# Patient Record
Sex: Female | Born: 1970 | Race: Black or African American | Hispanic: No | Marital: Married | Smoking: Never smoker
Health system: Southern US, Community
[De-identification: ages and names within clinical notes are randomized; demographics above are authoritative.]

## PROBLEM LIST (undated history)

## (undated) DIAGNOSIS — I82409 Acute embolism and thrombosis of unspecified deep veins of unspecified lower extremity: Secondary | ICD-10-CM

## (undated) DIAGNOSIS — R569 Unspecified convulsions: Secondary | ICD-10-CM

## (undated) DIAGNOSIS — G43909 Migraine, unspecified, not intractable, without status migrainosus: Secondary | ICD-10-CM

## (undated) DIAGNOSIS — N809 Endometriosis, unspecified: Secondary | ICD-10-CM

## (undated) DIAGNOSIS — M329 Systemic lupus erythematosus, unspecified: Secondary | ICD-10-CM

## (undated) HISTORY — PX: ABDOMINAL HYSTERECTOMY: SHX81

---

## 1998-03-06 ENCOUNTER — Other Ambulatory Visit: Admission: RE | Admit: 1998-03-06 | Discharge: 1998-03-06 | Payer: Self-pay | Admitting: Obstetrics

## 1998-04-10 ENCOUNTER — Emergency Department (HOSPITAL_COMMUNITY): Admission: EM | Admit: 1998-04-10 | Discharge: 1998-04-10 | Payer: Self-pay | Admitting: *Deleted

## 1998-05-08 ENCOUNTER — Inpatient Hospital Stay (HOSPITAL_COMMUNITY): Admission: AD | Admit: 1998-05-08 | Discharge: 1998-05-08 | Payer: Self-pay | Admitting: Obstetrics

## 1998-07-24 ENCOUNTER — Inpatient Hospital Stay (HOSPITAL_COMMUNITY): Admission: AD | Admit: 1998-07-24 | Discharge: 1998-07-24 | Payer: Self-pay | Admitting: Obstetrics

## 1998-10-15 ENCOUNTER — Emergency Department (HOSPITAL_COMMUNITY): Admission: EM | Admit: 1998-10-15 | Discharge: 1998-10-15 | Payer: Self-pay | Admitting: Emergency Medicine

## 1999-02-10 ENCOUNTER — Emergency Department (HOSPITAL_COMMUNITY): Admission: EM | Admit: 1999-02-10 | Discharge: 1999-02-11 | Payer: Self-pay | Admitting: Emergency Medicine

## 1999-05-05 ENCOUNTER — Emergency Department (HOSPITAL_COMMUNITY): Admission: EM | Admit: 1999-05-05 | Discharge: 1999-05-05 | Payer: Self-pay | Admitting: *Deleted

## 1999-05-07 ENCOUNTER — Emergency Department (HOSPITAL_COMMUNITY): Admission: EM | Admit: 1999-05-07 | Discharge: 1999-05-07 | Payer: Self-pay | Admitting: Emergency Medicine

## 1999-08-17 ENCOUNTER — Emergency Department (HOSPITAL_COMMUNITY): Admission: EM | Admit: 1999-08-17 | Discharge: 1999-08-17 | Payer: Self-pay | Admitting: *Deleted

## 2000-05-24 ENCOUNTER — Emergency Department (HOSPITAL_COMMUNITY): Admission: EM | Admit: 2000-05-24 | Discharge: 2000-05-24 | Payer: Self-pay | Admitting: Emergency Medicine

## 2000-05-25 ENCOUNTER — Encounter: Payer: Self-pay | Admitting: Emergency Medicine

## 2001-11-03 ENCOUNTER — Encounter: Payer: Self-pay | Admitting: Obstetrics

## 2001-11-03 ENCOUNTER — Ambulatory Visit (HOSPITAL_COMMUNITY): Admission: RE | Admit: 2001-11-03 | Discharge: 2001-11-03 | Payer: Self-pay | Admitting: Obstetrics

## 2002-06-13 ENCOUNTER — Encounter: Payer: Self-pay | Admitting: Emergency Medicine

## 2002-06-13 ENCOUNTER — Emergency Department (HOSPITAL_COMMUNITY): Admission: EM | Admit: 2002-06-13 | Discharge: 2002-06-13 | Payer: Self-pay | Admitting: Emergency Medicine

## 2002-08-20 ENCOUNTER — Emergency Department (HOSPITAL_COMMUNITY): Admission: EM | Admit: 2002-08-20 | Discharge: 2002-08-20 | Payer: Self-pay

## 2003-01-10 ENCOUNTER — Encounter: Payer: Self-pay | Admitting: Obstetrics and Gynecology

## 2003-01-10 ENCOUNTER — Inpatient Hospital Stay (HOSPITAL_COMMUNITY): Admission: AD | Admit: 2003-01-10 | Discharge: 2003-01-10 | Payer: Self-pay | Admitting: Obstetrics

## 2003-01-29 ENCOUNTER — Encounter: Payer: Self-pay | Admitting: Internal Medicine

## 2003-01-29 ENCOUNTER — Encounter: Admission: RE | Admit: 2003-01-29 | Discharge: 2003-01-29 | Payer: Self-pay | Admitting: Internal Medicine

## 2003-06-07 ENCOUNTER — Encounter: Payer: Self-pay | Admitting: Emergency Medicine

## 2003-06-07 ENCOUNTER — Emergency Department (HOSPITAL_COMMUNITY): Admission: EM | Admit: 2003-06-07 | Discharge: 2003-06-07 | Payer: Self-pay | Admitting: Emergency Medicine

## 2003-07-20 ENCOUNTER — Inpatient Hospital Stay (HOSPITAL_COMMUNITY): Admission: AD | Admit: 2003-07-20 | Discharge: 2003-07-21 | Payer: Self-pay | Admitting: Obstetrics

## 2003-07-20 ENCOUNTER — Encounter: Payer: Self-pay | Admitting: Obstetrics

## 2003-08-03 ENCOUNTER — Encounter: Payer: Self-pay | Admitting: Obstetrics and Gynecology

## 2003-08-03 ENCOUNTER — Inpatient Hospital Stay (HOSPITAL_COMMUNITY): Admission: AD | Admit: 2003-08-03 | Discharge: 2003-08-03 | Payer: Self-pay | Admitting: Obstetrics

## 2003-10-12 ENCOUNTER — Inpatient Hospital Stay (HOSPITAL_COMMUNITY): Admission: AD | Admit: 2003-10-12 | Discharge: 2003-10-12 | Payer: Self-pay | Admitting: Obstetrics

## 2005-12-14 DIAGNOSIS — M329 Systemic lupus erythematosus, unspecified: Secondary | ICD-10-CM

## 2005-12-14 HISTORY — DX: Systemic lupus erythematosus, unspecified: M32.9

## 2011-06-09 ENCOUNTER — Emergency Department (HOSPITAL_COMMUNITY)
Admission: EM | Admit: 2011-06-09 | Discharge: 2011-06-10 | Disposition: A | Discharge status: Home / Self Care | Payer: Medicaid Other | Attending: Emergency Medicine | Admitting: Emergency Medicine

## 2011-06-09 DIAGNOSIS — M25519 Pain in unspecified shoulder: Secondary | ICD-10-CM | POA: Insufficient documentation

## 2011-06-09 DIAGNOSIS — R064 Hyperventilation: Secondary | ICD-10-CM | POA: Insufficient documentation

## 2011-06-09 DIAGNOSIS — F410 Panic disorder [episodic paroxysmal anxiety] without agoraphobia: Secondary | ICD-10-CM | POA: Insufficient documentation

## 2011-06-09 DIAGNOSIS — M542 Cervicalgia: Secondary | ICD-10-CM | POA: Insufficient documentation

## 2011-06-09 DIAGNOSIS — R209 Unspecified disturbances of skin sensation: Secondary | ICD-10-CM | POA: Insufficient documentation

## 2011-06-09 DIAGNOSIS — R5383 Other fatigue: Secondary | ICD-10-CM | POA: Insufficient documentation

## 2011-06-09 DIAGNOSIS — R5381 Other malaise: Secondary | ICD-10-CM | POA: Insufficient documentation

## 2011-06-09 DIAGNOSIS — R55 Syncope and collapse: Secondary | ICD-10-CM | POA: Insufficient documentation

## 2011-06-09 DIAGNOSIS — R51 Headache: Secondary | ICD-10-CM | POA: Insufficient documentation

## 2011-06-09 DIAGNOSIS — Z79899 Other long term (current) drug therapy: Secondary | ICD-10-CM | POA: Insufficient documentation

## 2011-06-09 DIAGNOSIS — R6884 Jaw pain: Secondary | ICD-10-CM | POA: Insufficient documentation

## 2011-06-09 DIAGNOSIS — M329 Systemic lupus erythematosus, unspecified: Secondary | ICD-10-CM | POA: Insufficient documentation

## 2011-06-10 ENCOUNTER — Encounter (HOSPITAL_COMMUNITY): Payer: Self-pay | Admitting: Radiology

## 2011-06-10 ENCOUNTER — Emergency Department (HOSPITAL_COMMUNITY): Payer: Medicaid Other

## 2011-06-10 LAB — URINALYSIS, ROUTINE W REFLEX MICROSCOPIC
Glucose, UA: NEGATIVE mg/dL
Leukocytes, UA: NEGATIVE
Nitrite: NEGATIVE
Specific Gravity, Urine: 1.026 (ref 1.005–1.030)
pH: 6.5 (ref 5.0–8.0)

## 2011-06-10 LAB — CBC
Hemoglobin: 11.1 g/dL — ABNORMAL LOW (ref 12.0–15.0)
MCHC: 33.5 g/dL (ref 30.0–36.0)
MCV: 81.3 fL (ref 78.0–100.0)
RBC: 4.07 MIL/uL (ref 3.87–5.11)
WBC: 7.4 10*3/uL (ref 4.0–10.5)

## 2011-06-10 LAB — BASIC METABOLIC PANEL
BUN: 11 mg/dL (ref 6–23)
CO2: 21 mEq/L (ref 19–32)
Chloride: 103 mEq/L (ref 96–112)
GFR calc Af Amer: >60 mL/min (ref >60)
Potassium: 3.3 mEq/L — ABNORMAL LOW (ref 3.5–5.1)

## 2011-06-10 LAB — DIFFERENTIAL
Basophils Absolute: 0 10*3/uL (ref 0.0–0.1)
Lymphs Abs: 3 10*3/uL (ref 0.7–4.0)

## 2011-06-10 LAB — CK TOTAL AND CKMB (NOT AT ARMC): Relative Index: 1.5 (ref 0.0–2.5)

## 2011-06-10 LAB — TROPONIN I: Troponin I: <0.3 ng/mL (ref <0.30)

## 2011-06-20 DIAGNOSIS — G43109 Migraine with aura, not intractable, without status migrainosus: Secondary | ICD-10-CM | POA: Insufficient documentation

## 2011-06-20 DIAGNOSIS — N809 Endometriosis, unspecified: Secondary | ICD-10-CM | POA: Insufficient documentation

## 2011-06-20 DIAGNOSIS — F409 Phobic anxiety disorder, unspecified: Secondary | ICD-10-CM | POA: Insufficient documentation

## 2011-06-20 DIAGNOSIS — F329 Major depressive disorder, single episode, unspecified: Secondary | ICD-10-CM | POA: Insufficient documentation

## 2011-06-20 DIAGNOSIS — M1990 Unspecified osteoarthritis, unspecified site: Secondary | ICD-10-CM | POA: Insufficient documentation

## 2011-06-20 DIAGNOSIS — I82409 Acute embolism and thrombosis of unspecified deep veins of unspecified lower extremity: Secondary | ICD-10-CM | POA: Insufficient documentation

## 2011-06-20 DIAGNOSIS — F32A Depression, unspecified: Secondary | ICD-10-CM | POA: Insufficient documentation

## 2011-09-25 DIAGNOSIS — R102 Pelvic and perineal pain unspecified side: Secondary | ICD-10-CM | POA: Insufficient documentation

## 2012-02-29 DIAGNOSIS — M549 Dorsalgia, unspecified: Secondary | ICD-10-CM | POA: Insufficient documentation

## 2012-06-24 DIAGNOSIS — G932 Benign intracranial hypertension: Secondary | ICD-10-CM | POA: Insufficient documentation

## 2012-06-25 DIAGNOSIS — M542 Cervicalgia: Secondary | ICD-10-CM | POA: Insufficient documentation

## 2012-06-25 DIAGNOSIS — G250 Essential tremor: Secondary | ICD-10-CM | POA: Insufficient documentation

## 2012-09-27 DIAGNOSIS — T7491XA Unspecified adult maltreatment, confirmed, initial encounter: Secondary | ICD-10-CM | POA: Insufficient documentation

## 2013-03-26 ENCOUNTER — Encounter (HOSPITAL_COMMUNITY): Payer: Self-pay | Admitting: *Deleted

## 2013-03-26 ENCOUNTER — Emergency Department (HOSPITAL_COMMUNITY)
Admission: EM | Admit: 2013-03-26 | Discharge: 2013-03-27 | Disposition: A | Discharge status: Home / Self Care | Payer: Medicaid Other | Attending: Emergency Medicine | Admitting: Emergency Medicine

## 2013-03-26 DIAGNOSIS — R109 Unspecified abdominal pain: Secondary | ICD-10-CM

## 2013-03-26 DIAGNOSIS — M329 Systemic lupus erythematosus, unspecified: Secondary | ICD-10-CM | POA: Insufficient documentation

## 2013-03-26 DIAGNOSIS — G40909 Epilepsy, unspecified, not intractable, without status epilepticus: Secondary | ICD-10-CM | POA: Insufficient documentation

## 2013-03-26 DIAGNOSIS — Z9889 Other specified postprocedural states: Secondary | ICD-10-CM | POA: Insufficient documentation

## 2013-03-26 DIAGNOSIS — Z9079 Acquired absence of other genital organ(s): Secondary | ICD-10-CM | POA: Insufficient documentation

## 2013-03-26 DIAGNOSIS — N7013 Chronic salpingitis and oophoritis: Secondary | ICD-10-CM | POA: Insufficient documentation

## 2013-03-26 DIAGNOSIS — Z90710 Acquired absence of both cervix and uterus: Secondary | ICD-10-CM | POA: Insufficient documentation

## 2013-03-26 DIAGNOSIS — N7011 Chronic salpingitis: Secondary | ICD-10-CM

## 2013-03-26 DIAGNOSIS — R112 Nausea with vomiting, unspecified: Secondary | ICD-10-CM | POA: Insufficient documentation

## 2013-03-26 HISTORY — DX: Systemic lupus erythematosus, unspecified: M32.9

## 2013-03-26 MED ORDER — FENTANYL CITRATE 0.05 MG/ML IJ SOLN
50.0000 ug | Freq: Once | INTRAMUSCULAR | Status: AC
  Administered 2013-03-27: 50 ug via INTRAVENOUS
  Filled 2013-03-26: qty 2

## 2013-03-26 MED ORDER — ONDANSETRON HCL 4 MG/2ML IJ SOLN
4.0000 mg | Freq: Once | INTRAMUSCULAR | Status: AC
  Administered 2013-03-27: 4 mg via INTRAVENOUS
  Filled 2013-03-26: qty 2

## 2013-03-26 NOTE — ED Notes (Signed)
C/o abd pain, onset 1.5 hrs ago, also nv. H/o lupus. Pinpoints to RLQ. Gradually progressively worse. Restless/writhing.

## 2013-03-26 NOTE — ED Notes (Signed)
Back to room via w/c, with family, plan and process explained, family with pt.

## 2013-03-27 ENCOUNTER — Emergency Department (HOSPITAL_COMMUNITY): Payer: Medicaid Other

## 2013-03-27 ENCOUNTER — Encounter (HOSPITAL_COMMUNITY): Payer: Self-pay

## 2013-03-27 DIAGNOSIS — M329 Systemic lupus erythematosus, unspecified: Secondary | ICD-10-CM | POA: Insufficient documentation

## 2013-03-27 LAB — COMPREHENSIVE METABOLIC PANEL
ALT: 13 U/L (ref 0–35)
AST: 20 U/L (ref 0–37)
Albumin: 4.3 g/dL (ref 3.5–5.2)
Alkaline Phosphatase: 68 U/L (ref 39–117)
Chloride: 103 mEq/L (ref 96–112)
Potassium: 3.5 mEq/L (ref 3.5–5.1)
Sodium: 139 mEq/L (ref 135–145)
Total Bilirubin: 0.3 mg/dL (ref 0.3–1.2)

## 2013-03-27 LAB — CBC WITH DIFFERENTIAL/PLATELET
Eosinophils Absolute: 0.2 10*3/uL (ref 0.0–0.7)
Eosinophils Relative: 2 % (ref 0–5)
Lymphs Abs: 2.7 10*3/uL (ref 0.7–4.0)
MCH: 28.1 pg (ref 26.0–34.0)
MCHC: 34.2 g/dL (ref 30.0–36.0)
MCV: 82.3 fL (ref 78.0–100.0)
Platelets: 400 10*3/uL (ref 150–400)
RBC: 4.41 MIL/uL (ref 3.87–5.11)
RDW: 12.8 % (ref 11.5–15.5)

## 2013-03-27 LAB — URINE MICROSCOPIC-ADD ON

## 2013-03-27 LAB — URINALYSIS, ROUTINE W REFLEX MICROSCOPIC
Glucose, UA: NEGATIVE mg/dL
Ketones, ur: NEGATIVE mg/dL
Leukocytes, UA: NEGATIVE
Protein, ur: NEGATIVE mg/dL

## 2013-03-27 MED ORDER — ONDANSETRON HCL 4 MG PO TABS: 4.0000 mg | ORAL_TABLET | Freq: Four times a day (QID) | ORAL | Status: DC

## 2013-03-27 MED ORDER — IOHEXOL 300 MG/ML  SOLN
100.0000 mL | Freq: Once | INTRAMUSCULAR | Status: AC | PRN
  Administered 2013-03-27: 80 mL via INTRAVENOUS

## 2013-03-27 MED ORDER — IOHEXOL 300 MG/ML  SOLN
25.0000 mL | INTRAMUSCULAR | Status: AC
  Administered 2013-03-27 (×2): 25 mL via ORAL

## 2013-03-27 MED ORDER — ONDANSETRON HCL 4 MG/2ML IJ SOLN
4.0000 mg | Freq: Once | INTRAMUSCULAR | Status: AC
  Administered 2013-03-27: 4 mg via INTRAVENOUS
  Filled 2013-03-27: qty 2

## 2013-03-27 MED ORDER — DIPHENHYDRAMINE HCL 50 MG/ML IJ SOLN
25.0000 mg | Freq: Once | INTRAMUSCULAR | Status: AC
  Administered 2013-03-27: 25 mg via INTRAVENOUS

## 2013-03-27 MED ORDER — HYDROMORPHONE HCL PF 1 MG/ML IJ SOLN
1.0000 mg | Freq: Once | INTRAMUSCULAR | Status: AC
  Administered 2013-03-27: 1 mg via INTRAVENOUS
  Filled 2013-03-27: qty 1

## 2013-03-27 MED ORDER — OXYCODONE-ACETAMINOPHEN 5-325 MG PO TABS: 2.0000 | ORAL_TABLET | ORAL | Status: DC | PRN

## 2013-03-27 MED ORDER — DIPHENHYDRAMINE HCL 50 MG/ML IJ SOLN
INTRAMUSCULAR | Status: AC
  Filled 2013-03-27: qty 1

## 2013-03-27 NOTE — ED Notes (Signed)
Pt c/o sudden onset RLQ pain starting approx 2 hours ago. Pt reports having a lupus flare up 2 weeks ago, pt reports this pain is different than her lupus flare up 2 weeks ago. Pt does c/o all over joint pain starting this am. Pt states she has had 2 partial hysterectomy's at [facility]. Pt states she found out 2 months ago that she still has her Right fallopian tube and Right ovary which she thought was removed in 2008. Pt denies abnormal vaginal odor, discharge, or N/V

## 2013-03-27 NOTE — ED Provider Notes (Signed)
History     CSN: [id number]  Arrival date & time 03/26/13  2305   First MD Initiated Contact with Patient 03/26/13 2345      Chief Complaint  Patient presents with  . Abdominal Pain  . Emesis    (Consider location/radiation/quality/duration/timing/severity/associated sxs/prior treatment) HPI 42 year old female presents to the emergency department with complaint of severe right-sided abdominal pain.  Pain starts in around 945.  She has had one episode of nausea and vomiting associated with the pain.  Patient reports about 2 weeks ago, she was told that she had an enlarged right fallopian tube, while at an outpatient clinic at [facility].  She was placed on pain medicine.  Patient has history of lupus, and reports that she is on steroid injections as prednisone pills "are not helping my disease".  Patient reports past history of severe endometriosis.  She is status post hysterectomy.  She reports she had a left oophorectomy done as well.  Patient still has gallbladder, and appendix.  Pain came on suddenly.  She has had no recent diarrhea, she reports her last bowel movement was today and was normal.  No fevers or chills.  She denies history of diverticulitis, diverticulosis or colitis.  Past Medical History  Diagnosis Date  . Seizure disorder   . Lupus     Past Surgical History  Procedure Laterality Date  . Abdominal hysterectomy    . Cesarean section      No family history on file.  History  Substance Use Topics  . Smoking status: Never Smoker   . Smokeless tobacco: Not on file  . Alcohol Use: No    OB History   Grav Para Term Preterm Abortions TAB SAB Ect Mult Living                  Review of Systems  See History of Present Illness; otherwise all other systems are reviewed and negative Allergies  Compazine; Nsaids; and Reglan  Home Medications   Current Outpatient Rx  Name  Route  Sig  Dispense  Refill  . acetaminophen-codeine (TYLENOL #3) 300-30 MG per tablet    Oral   Take 1 tablet by mouth every 4 (four) hours as needed for pain.         . Cholecalciferol (VITAMIN D PO)   Oral   Take 1 tablet by mouth daily.         . clonazePAM (KLONOPIN) 0.5 MG tablet   Oral   Take 0.5 mg by mouth daily.         Marland Kitchen thiamine (VITAMIN B-1) 100 MG tablet   Oral   Take 100 mg by mouth daily.           BP 120/95  Pulse 110  Temp(Src) 98.9 F (37.2 C) (Oral)  Resp 28  SpO2 100%  Physical Exam  Nursing note and vitals reviewed. Constitutional: She is oriented to person, place, and time. She appears well-developed and well-nourished.  HENT:  Head: Normocephalic and atraumatic.  Nose: Nose normal.  Mouth/Throat: Oropharynx is clear and moist.  Eyes: Conjunctivae and EOM are normal. Pupils are equal, round, and reactive to light.  Neck: Normal range of motion. Neck supple. No JVD present. No tracheal deviation present. No thyromegaly present.  Cardiovascular: Normal rate, regular rhythm, normal heart sounds and intact distal pulses.  Exam reveals no gallop and no friction rub.   No murmur heard. Pulmonary/Chest: Effort normal and breath sounds normal. No stridor. No respiratory distress. She has  no wheezes. She has no rales. She exhibits no tenderness.  Abdominal: Soft. She exhibits distension. She exhibits no mass. There is tenderness (severe tenderness along the right side of the abdomen..  Tenderness extends from hostile margin down to iliac crest.  Not significantly tender over suprapubic region or left side of abdomen.  There is no rebound, there is voluntary guarding.  ). There is no rebound and no guarding.  Decreased bowel sounds  Musculoskeletal: Normal range of motion. She exhibits no edema and no tenderness.  Lymphadenopathy:    She has no cervical adenopathy.  Neurological: She is alert and oriented to person, place, and time. She exhibits normal muscle tone. Coordination normal.  Skin: Skin is warm and dry. No rash noted. No erythema.  No pallor.  Psychiatric: She has a normal mood and affect. Her behavior is normal. Judgment and thought content normal.    ED Course  Procedures (including critical care time)  Labs Reviewed  COMPREHENSIVE METABOLIC PANEL - Abnormal; Notable for the following:    GFR calc non Af Amer 89 (*)    All other components within normal limits  CBC WITH DIFFERENTIAL  LACTIC ACID, PLASMA  URINALYSIS, ROUTINE W REFLEX MICROSCOPIC   US Transvaginal Non-ob  03/27/2013  *RADIOLOGY REPORT*  Clinical Data:  Right-sided hydrosalpinx increased in size; severe right-sided pelvic pain.  Assess for torsion.  TRANSABDOMINAL AND TRANSVAGINAL ULTRASOUND OF PELVIS DOPPLER ULTRASOUND OF OVARIES  Technique:  Both transabdominal and transvaginal ultrasound examinations of the pelvis were performed. Transabdominal technique was performed for global imaging of the pelvis including uterus, ovaries, adnexal regions, and pelvic cul-de-sac.  It was necessary to proceed with endovaginal exam following the transabdominal exam to visualize the right ovary and adnexa in greater detail.  Color and duplex Doppler ultrasound was utilized to evaluate blood flow to the ovaries.  Comparison:  None.  Findings:  Uterus:  Status post hysterectomy.  Right ovary: Normal appearance/no adnexal mass; measures 3.7 x 2.0 x 2.4 cm.  Left ovary:   Status post left-sided oophorectomy, per clinical correlation.  Pulsed Doppler evaluation demonstrates normal low-resistance arterial and venous waveforms in the right ovary.  A large hypoechoic tubular structure is noted at the right adnexa, adjacent to the right ovary.  Per the patient's clinical history, this apparently reflects a large hydrosalpinx, measuring approximately 7.7 x 6.8 x 3.4 cm.  Alternatively, a septated paraovarian cyst might have a similar appearance.  A small amount of free fluid is noted about the right ovary.  IMPRESSION:  1.  Large right-sided hydrosalpinx again noted, measuring 7.7 x  6.8 x 3.4 cm.  This is anechoic in appearance, without evidence for infection. 2.  Right ovary unremarkable in appearance; no evidence of ovarian torsion. 3.  Status post hysterectomy and left-sided oophorectomy.   Original Report Authenticated By: Tonia Ghent, M.D.    US Pelvis Complete  03/27/2013  *RADIOLOGY REPORT*  Clinical Data:  Right-sided hydrosalpinx increased in size; severe right-sided pelvic pain.  Assess for torsion.  TRANSABDOMINAL AND TRANSVAGINAL ULTRASOUND OF PELVIS DOPPLER ULTRASOUND OF OVARIES  Technique:  Both transabdominal and transvaginal ultrasound examinations of the pelvis were performed. Transabdominal technique was performed for global imaging of the pelvis including uterus, ovaries, adnexal regions, and pelvic cul-de-sac.  It was necessary to proceed with endovaginal exam following the transabdominal exam to visualize the right ovary and adnexa in greater detail.  Color and duplex Doppler ultrasound was utilized to evaluate blood flow to the ovaries.  Comparison:  None.  Findings:  Uterus:  Status post hysterectomy.  Right ovary: Normal appearance/no adnexal mass; measures 3.7 x 2.0 x 2.4 cm.  Left ovary:   Status post left-sided oophorectomy, per clinical correlation.  Pulsed Doppler evaluation demonstrates normal low-resistance arterial and venous waveforms in the right ovary.  A large hypoechoic tubular structure is noted at the right adnexa, adjacent to the right ovary.  Per the patient's clinical history, this apparently reflects a large hydrosalpinx, measuring approximately 7.7 x 6.8 x 3.4 cm.  Alternatively, a septated paraovarian cyst might have a similar appearance.  A small amount of free fluid is noted about the right ovary.  IMPRESSION:  1.  Large right-sided hydrosalpinx again noted, measuring 7.7 x 6.8 x 3.4 cm.  This is anechoic in appearance, without evidence for infection. 2.  Right ovary unremarkable in appearance; no evidence of ovarian torsion. 3.  Status post  hysterectomy and left-sided oophorectomy.   Original Report Authenticated By: Tonia Ghent, M.D.    Ct Abdomen Pelvis W Contrast  03/27/2013  *RADIOLOGY REPORT*  Clinical Data: Severe right-sided abdominal pain, particularly right lower quadrant with nausea and vomiting for 3 days  CT ABDOMEN AND PELVIS WITH CONTRAST  Technique:  Multidetector CT imaging of the abdomen and pelvis was performed following the standard protocol during bolus administration of intravenous contrast.  Contrast: 80mL OMNIPAQUE IOHEXOL 300 MG/ML  SOLN  Comparison: Ultrasound of the pelvis of 03/27/2013  Findings: The lung bases are clear.  The liver is inhomogeneous which may indicate a degree of fatty infiltration.  No focal abnormality is seen.  No calcified gallstones are noted.  The pancreas is normal in size and the pancreatic duct is not dilated. The adrenal glands and spleen are unremarkable.  The stomach is decompressed.  The kidneys enhance and a probable cyst is noted in the mid lateral left kidney.  On delayed images, the pelvocaliceal systems are unremarkable in the proximal ureters are normal in caliber.  The abdominal aorta is normal in caliber.  No adenopathy is seen.  The appendix is well visualized low in the right pelvis and it is normal in size, filling partially with oral contrast.  There is no evidence of acute appendicitis.  However the cystic structure noted within the right adnexa by ultrasound is well visualized and by history represents  a right hydrosalpinx.  No significant free fluid is seen within the pelvis.  This apparent right hydrosalpinx measures approximately 5.4 x 3.9 x 4.7 cm.  No left adnexal lesion is seen.  The urinary bladder is unremarkable.  No abnormality of the colon is seen and the terminal ileum is unremarkable.  No abnormality of small bowel seen.  No bony abnormality is noted.  IMPRESSION:  1.  The appendix is well visualized and appears normal by CT. 2.  Tubular low attenuation fluid  collection within the right adnexa consistent with right hydrosalpinx by history.  No complicating features are noted.  No free air is noted. 3.  Somewhat inhomogeneous liver.  Question mild fatty infiltration.   Original Report Authenticated By: Dwyane Dee, M.D.    [place] Art/ven Flow Abd Pelv Doppler  03/27/2013  *RADIOLOGY REPORT*  Clinical Data:  Right-sided hydrosalpinx increased in size; severe right-sided pelvic pain.  Assess for torsion.  TRANSABDOMINAL AND TRANSVAGINAL ULTRASOUND OF PELVIS DOPPLER ULTRASOUND OF OVARIES  Technique:  Both transabdominal and transvaginal ultrasound examinations of the pelvis were performed. Transabdominal technique was performed for global imaging of the pelvis including uterus, ovaries, adnexal regions, and  pelvic cul-de-sac.  It was necessary to proceed with endovaginal exam following the transabdominal exam to visualize the right ovary and adnexa in greater detail.  Color and duplex Doppler ultrasound was utilized to evaluate blood flow to the ovaries.  Comparison:  None.  Findings:  Uterus:  Status post hysterectomy.  Right ovary: Normal appearance/no adnexal mass; measures 3.7 x 2.0 x 2.4 cm.  Left ovary:   Status post left-sided oophorectomy, per clinical correlation.  Pulsed Doppler evaluation demonstrates normal low-resistance arterial and venous waveforms in the right ovary.  A large hypoechoic tubular structure is noted at the right adnexa, adjacent to the right ovary.  Per the patient's clinical history, this apparently reflects a large hydrosalpinx, measuring approximately 7.7 x 6.8 x 3.4 cm.  Alternatively, a septated paraovarian cyst might have a similar appearance.  A small amount of free fluid is noted about the right ovary.  IMPRESSION:  1.  Large right-sided hydrosalpinx again noted, measuring 7.7 x 6.8 x 3.4 cm.  This is anechoic in appearance, without evidence for infection. 2.  Right ovary unremarkable in appearance; no evidence of ovarian torsion. 3.   Status post hysterectomy and left-sided oophorectomy.   Original Report Authenticated By: Tonia Ghent, M.D.      1. Hydrosalpinx   2. Right sided abdominal pain       MDM  42 year old female with acute right-sided abdominal pain.  Differential includes ovarian torsion, ruptured ovarian cyst, appendicitis, cholecystitis.  Start with ultrasound of the fallopian tubes.  Given her recent history of swelling in the area.  May need CT scan.  We'll provide pain control in the meantime.      4:47 AM No signs of ovarian torsion on ultrasound.  Patient still with persistent considerable right-sided pain.  Will proceed with CT abdomen pelvis.  Given her use of steroids, she is a high-risk for occult infection.  7:53 AM No acute findings on CT scan.  Pt feeling slightly better, will get another dose of dilaudid and plan to d/c on percocet.  Will provide patient with gyn follow up information.   Olivia Mackie, MD 03/27/13 [phone]

## 2013-03-27 NOTE — ED Notes (Signed)
Patient called out to advise she had finished contrast.  Called CT to advise.

## 2013-03-27 NOTE — ED Notes (Signed)
Patient transported to Ultrasound 

## 2013-03-27 NOTE — ED Notes (Signed)
Patient just back from ultrasound.  Patient states she needs pain medication and nausea meds.  Will advise MD.

## 2013-03-27 NOTE — ED Notes (Signed)
Patient states that she is feeling better.  She is currently trying to drink contrast.   Patient claims she went to restroom while in [place], but did not get sample at that time.  Patient aware that we need sample when she can provide one.

## 2013-03-27 NOTE — ED Notes (Signed)
Per Ian Malkin, RN.   CT called and advised that patient needed benadryl due to allergic reaction.   Ian Malkin gave the medication and patient remained in CT to complete.

## 2013-03-30 LAB — URINE CULTURE: Colony Count: >=100000

## 2013-04-02 NOTE — ED Notes (Signed)
Printed culture report for EDP eval.

## 2013-05-20 ENCOUNTER — Inpatient Hospital Stay (HOSPITAL_COMMUNITY)
Admission: AD | Admit: 2013-05-20 | Discharge: 2013-05-20 | Disposition: A | Discharge status: Home / Self Care | Payer: Medicaid Other | Source: Ambulatory Visit | Attending: Obstetrics & Gynecology | Admitting: Obstetrics & Gynecology

## 2013-05-20 ENCOUNTER — Encounter (HOSPITAL_COMMUNITY): Payer: Self-pay

## 2013-05-20 ENCOUNTER — Inpatient Hospital Stay (HOSPITAL_COMMUNITY): Payer: Medicaid Other

## 2013-05-20 DIAGNOSIS — N7011 Chronic salpingitis: Secondary | ICD-10-CM

## 2013-05-20 DIAGNOSIS — R112 Nausea with vomiting, unspecified: Secondary | ICD-10-CM | POA: Insufficient documentation

## 2013-05-20 DIAGNOSIS — R109 Unspecified abdominal pain: Secondary | ICD-10-CM | POA: Insufficient documentation

## 2013-05-20 DIAGNOSIS — N83209 Unspecified ovarian cyst, unspecified side: Secondary | ICD-10-CM | POA: Insufficient documentation

## 2013-05-20 DIAGNOSIS — N7013 Chronic salpingitis and oophoritis: Secondary | ICD-10-CM

## 2013-05-20 DIAGNOSIS — Z90710 Acquired absence of both cervix and uterus: Secondary | ICD-10-CM | POA: Insufficient documentation

## 2013-05-20 HISTORY — DX: Endometriosis, unspecified: N80.9

## 2013-05-20 LAB — URINALYSIS, ROUTINE W REFLEX MICROSCOPIC
Ketones, ur: NEGATIVE mg/dL
Nitrite: NEGATIVE
Specific Gravity, Urine: >1.03 — ABNORMAL HIGH (ref 1.005–1.030)
pH: 5.5 (ref 5.0–8.0)

## 2013-05-20 LAB — URINE MICROSCOPIC-ADD ON

## 2013-05-20 LAB — RAPID URINE DRUG SCREEN, HOSP PERFORMED
Benzodiazepines: NOT DETECTED
Opiates: NOT DETECTED

## 2013-05-20 MED ORDER — PROMETHAZINE HCL 25 MG PO TABS
25.0000 mg | ORAL_TABLET | Freq: Four times a day (QID) | ORAL | Status: DC | PRN
  Administered 2013-05-20: 25 mg via ORAL
  Filled 2013-05-20: qty 1

## 2013-05-20 MED ORDER — LACTATED RINGERS IV BOLUS (SEPSIS)
1000.0000 mL | Freq: Once | INTRAVENOUS | Status: DC
Start: 2013-05-20 — End: 2013-05-20

## 2013-05-20 MED ORDER — HYDROMORPHONE HCL PF 1 MG/ML IJ SOLN
2.0000 mg | Freq: Once | INTRAMUSCULAR | Status: AC
  Administered 2013-05-20: 2 mg via INTRAMUSCULAR
  Filled 2013-05-20: qty 2

## 2013-05-20 MED ORDER — ONDANSETRON 4 MG PO TBDP: 4.0000 mg | ORAL_TABLET | Freq: Four times a day (QID) | ORAL | Status: DC | PRN

## 2013-05-20 MED ORDER — ONDANSETRON 4 MG PO TBDP
4.0000 mg | ORAL_TABLET | Freq: Four times a day (QID) | ORAL | Status: DC | PRN
  Administered 2013-05-20: 4 mg via ORAL
  Filled 2013-05-20: qty 1

## 2013-05-20 MED ORDER — HYDROMORPHONE HCL PF 2 MG/ML IJ SOLN
2.0000 mg | INTRAMUSCULAR | Status: DC | PRN
  Administered 2013-05-20: 2 mg via INTRAMUSCULAR

## 2013-05-20 MED ORDER — OXYCODONE-ACETAMINOPHEN 5-325 MG PO TABS: 1.0000 | ORAL_TABLET | ORAL | Status: DC | PRN

## 2013-05-20 MED ORDER — PROMETHAZINE HCL 25 MG PO TABS: 25.0000 mg | ORAL_TABLET | Freq: Four times a day (QID) | ORAL | Status: DC | PRN

## 2013-05-20 MED ORDER — LACTATED RINGERS IV BOLUS (SEPSIS): 1000.0000 mL | Freq: Once | INTRAVENOUS | Status: DC

## 2013-05-20 MED ORDER — HYDROMORPHONE HCL PF 2 MG/ML IJ SOLN
2.0000 mg | INTRAMUSCULAR | Status: DC | PRN
  Filled 2013-05-20: qty 1

## 2013-05-20 NOTE — MAU Note (Signed)
Had uterus removed in 2005. In 2008 took out L ovary. Have hx endometriosis. Having pain R side of abdomen that goes to back. Vomiting some. Last u/s showed had fld in fallopian tube. Doctor is at [facility] because I did live in [place].

## 2013-05-20 NOTE — MAU Note (Signed)
Dr. Thad Ranger notified unable to get IV access. Will proceed with PO hydration.

## 2013-05-20 NOTE — MAU Provider Note (Signed)
History     CSN: [id number]  Arrival date and time: 05/20/13 0100   None     Chief Complaint  Patient presents with  . Abdominal Pain   HPI 42 y.o. [id number] with endometriosis with severe right side pain starting 2 days ago, nausea, vomiting. Had hysterectomy in 2005. Left ovary removed 2008. Right ovary is still there. Seen in [facility] and was recommended to have surgery but has not wanted to do that yet. Has enlarged R fallopian tube. Was given pain medicine. Pain comes and goes but has been present for several months. Seen at ED in 4/14 for same problem. Has history of lupus and severe endometriosis. No fever. Does have nausea/vomiting. No diarrhea or constipation. No dysuria or vaginal discharge. No vaginal bleeding.   CT on 03/27/13 showed:  cystic structure noted  within the right adnexa by ultrasound is well visualized and by  history represents a right hydrosalpinx. No significant free  fluid is seen within the pelvis. This apparent right hydrosalpinx  measures approximately 5.4 x 3.9 x 4.7 cm. No left adnexal lesion  is seen.    Past Medical History  Diagnosis Date  . Seizure disorder   . Lupus   . Lupus (systemic lupus erythematosus) 2007  . Lupus (systemic lupus erythematosus) 2007  . Endometriosis   . Medical history non-contributory     Past Surgical History  Procedure Laterality Date  . Abdominal hysterectomy    . Cesarean section      No family history on file.  History  Substance Use Topics  . Smoking status: Never Smoker   . Smokeless tobacco: Not on file  . Alcohol Use: No    Allergies:  Allergies  Allergen Reactions  . Omnipaque (Iohexol) Hives and Itching    Pt given Benadryl after reaction (at [facility]); IV Benadryl given prior to CT 03/27/2013; pt tolerated procedure without problems.  . Compazine (Prochlorperazine) Hives  . Nsaids Hives  . Reglan (Metoclopramide) Hives    Prescriptions prior to admission  Medication Sig Dispense Refill  .  acetaminophen-codeine (TYLENOL #3) 300-30 MG per tablet Take 1 tablet by mouth every 4 (four) hours as needed for pain.      . Cholecalciferol (VITAMIN D PO) Take 1 tablet by mouth daily.      . clonazePAM (KLONOPIN) 0.5 MG tablet Take 0.5 mg by mouth daily.      . ondansetron (ZOFRAN) 4 MG tablet Take 1 tablet (4 mg total) by mouth every 6 (six) hours.  12 tablet  0  . oxyCODONE-acetaminophen (PERCOCET/ROXICET) 5-325 MG per tablet Take 2 tablets by mouth every 4 (four) hours as needed for pain.      Marland Kitchen thiamine (VITAMIN B-1) 100 MG tablet Take 100 mg by mouth daily.      . [DISCONTINUED] oxyCODONE-acetaminophen (PERCOCET/ROXICET) 5-325 MG per tablet Take 2 tablets by mouth every 4 (four) hours as needed for pain.  20 tablet  0    ROS  See HPI  Physical Exam   Blood pressure 137/80, pulse 91, temperature 98.3 F (36.8 C), resp. rate 20, height 5\' 3"  (1.6 m), weight 74.934 kg (165 lb 3.2 oz), SpO2 100.00%.  Physical Exam GEN:  WNWD, severe distress due to pain HEENT:  NCAT, EOMI, conjunctiva clear CV: RRR, no murmur RESP:  CTAB ABD:  Very tender RLQ>RUQ,  guarding.  EXTREM:  Warm, well perfused, no edema or tenderness NEURO:  Alert, oriented, no focal deficits GU:  Deferred, pt not able to tolerate  TRANSVAGINAL ULTRASOUND OF PELVIS  Technique: Transvaginal ultrasound examination of the pelvis was  performed including evaluation of the uterus, ovaries, adnexal  regions, and pelvic cul-de-sac.  Comparison: CT of the abdomen and pelvis, and pelvic  ultrasound,performed 03/27/2013  Findings:  Uterus: Status post hysterectomy.  Right ovary: A new complex hemorrhagic cyst is noted at the right  ovary, measuring 2.9 x 2.4 x 2.5 cm. Associated layering fluid  components are seen. The right ovary is otherwise unremarkable in  appearance. There is no evidence for ovarian torsion; limited  Doppler evaluation demonstrates normal color Doppler blood flow  with respect to the right ovary.  The right ovary measures 4.4 x  3.2 x 3.4 cm.  Left ovary: Status post left-sided salpingo-oophorectomy.  Other Findings: A large right-sided hydrosalpinx is again noted,  measuring 7.9 x 3.0 x 5.8 cm. On correlation with the prior  ultrasound, this is relatively stable.  IMPRESSION:  1. New 2.9 cm hemorrhagic cyst at the right ovary.  2. No evidence for ovarian torsion; status post hysterectomy and  left-sided salpingo-oophorectomy.  3. Stable large right-sided hydrosalpinx again noted, measuring  7.9 x 3.0 x 5.8 cm.    MAU Course  Procedures  Received dilaudid IM and zofran in MAU. Unable to place IV after multiple attempts. Pt declined further attempts and felt she would be able to manage at home with PO pain and nausea medication.    Assessment and Plan  42 y.o. [phone] with severe R abdominal pain - Hemorrhagic cyst and R hydrosalpinx (slightly increased in size since April) - Hemodynamically stable - Home with pain management (percocet) and zofran/phenergan for nausea - Follow up at [facility] on 6/27 as scheduled for possible surgery - Return if unable to tolerate PO, fever, intractable pain  Napoleon Form 05/20/2013, 2:07 AM

## 2013-05-20 NOTE — MAU Note (Signed)
Dr. Thad Ranger notified patient is unable to obtain another urine specimen therefore urine GC/CH will not be collected and sent. Patient tolerating water and phenergan PO. Pain is better 5/10.  Orders to d/c patient home.

## 2013-05-22 NOTE — MAU Provider Note (Signed)
Attestation of Attending Supervision of Fellow: Evaluation and management procedures were performed by the Fellow under my supervision and collaboration.  I have reviewed the Fellow's note and chart, and I agree with the management and plan.    

## 2013-06-11 ENCOUNTER — Emergency Department (HOSPITAL_COMMUNITY): Payer: Medicaid Other

## 2013-06-11 ENCOUNTER — Emergency Department (HOSPITAL_COMMUNITY)
Admission: EM | Admit: 2013-06-11 | Discharge: 2013-06-11 | Disposition: A | Discharge status: Home / Self Care | Payer: Medicaid Other | Attending: Emergency Medicine | Admitting: Emergency Medicine

## 2013-06-11 ENCOUNTER — Encounter (HOSPITAL_COMMUNITY): Payer: Self-pay | Admitting: Emergency Medicine

## 2013-06-11 DIAGNOSIS — Z8742 Personal history of other diseases of the female genital tract: Secondary | ICD-10-CM | POA: Insufficient documentation

## 2013-06-11 DIAGNOSIS — G40909 Epilepsy, unspecified, not intractable, without status epilepticus: Secondary | ICD-10-CM | POA: Insufficient documentation

## 2013-06-11 DIAGNOSIS — R141 Gas pain: Secondary | ICD-10-CM | POA: Insufficient documentation

## 2013-06-11 DIAGNOSIS — M329 Systemic lupus erythematosus, unspecified: Secondary | ICD-10-CM | POA: Insufficient documentation

## 2013-06-11 DIAGNOSIS — R143 Flatulence: Secondary | ICD-10-CM | POA: Insufficient documentation

## 2013-06-11 DIAGNOSIS — R109 Unspecified abdominal pain: Secondary | ICD-10-CM

## 2013-06-11 DIAGNOSIS — M7989 Other specified soft tissue disorders: Secondary | ICD-10-CM | POA: Insufficient documentation

## 2013-06-11 DIAGNOSIS — R112 Nausea with vomiting, unspecified: Secondary | ICD-10-CM | POA: Insufficient documentation

## 2013-06-11 DIAGNOSIS — M2550 Pain in unspecified joint: Secondary | ICD-10-CM

## 2013-06-11 DIAGNOSIS — Z79899 Other long term (current) drug therapy: Secondary | ICD-10-CM | POA: Insufficient documentation

## 2013-06-11 DIAGNOSIS — Z86718 Personal history of other venous thrombosis and embolism: Secondary | ICD-10-CM | POA: Insufficient documentation

## 2013-06-11 DIAGNOSIS — R142 Eructation: Secondary | ICD-10-CM | POA: Insufficient documentation

## 2013-06-11 LAB — COMPREHENSIVE METABOLIC PANEL
ALT: 17 U/L (ref 0–35)
AST: 19 U/L (ref 0–37)
CO2: 23 mEq/L (ref 19–32)
Chloride: 100 mEq/L (ref 96–112)
GFR calc non Af Amer: >90 mL/min (ref >90)
Sodium: 134 mEq/L — ABNORMAL LOW (ref 135–145)
Total Bilirubin: 0.2 mg/dL — ABNORMAL LOW (ref 0.3–1.2)

## 2013-06-11 LAB — CBC WITH DIFFERENTIAL/PLATELET
Basophils Absolute: 0 10*3/uL (ref 0.0–0.1)
HCT: 34.9 % — ABNORMAL LOW (ref 36.0–46.0)
Lymphocytes Relative: 29 % (ref 12–46)
Neutro Abs: 4.5 10*3/uL (ref 1.7–7.7)
Platelets: 331 10*3/uL (ref 150–400)
RDW: 13.6 % (ref 11.5–15.5)
WBC: 7.3 10*3/uL (ref 4.0–10.5)

## 2013-06-11 LAB — URINALYSIS, ROUTINE W REFLEX MICROSCOPIC
Bilirubin Urine: NEGATIVE
Glucose, UA: NEGATIVE mg/dL
Ketones, ur: NEGATIVE mg/dL
pH: 6.5 (ref 5.0–8.0)

## 2013-06-11 MED ORDER — IOHEXOL 300 MG/ML  SOLN
100.0000 mL | Freq: Once | INTRAMUSCULAR | Status: AC | PRN
  Administered 2013-06-11: 100 mL via INTRAVENOUS

## 2013-06-11 MED ORDER — HYDROCORTISONE SOD SUCCINATE 100 MG IJ SOLR
200.0000 mg | Freq: Once | INTRAMUSCULAR | Status: AC
  Administered 2013-06-11: 200 mg via INTRAVENOUS
  Filled 2013-06-11: qty 4

## 2013-06-11 MED ORDER — OXYCODONE-ACETAMINOPHEN 5-325 MG PO TABS: 1.0000 | ORAL_TABLET | ORAL | Status: DC | PRN

## 2013-06-11 MED ORDER — DIPHENHYDRAMINE HCL 50 MG/ML IJ SOLN
50.0000 mg | Freq: Once | INTRAMUSCULAR | Status: AC
  Administered 2013-06-11: 50 mg via INTRAVENOUS
  Filled 2013-06-11: qty 1

## 2013-06-11 MED ORDER — HYDROMORPHONE HCL PF 2 MG/ML IJ SOLN
2.0000 mg | Freq: Once | INTRAMUSCULAR | Status: AC
  Administered 2013-06-11: 2 mg via INTRAVENOUS
  Filled 2013-06-11: qty 1

## 2013-06-11 MED ORDER — ONDANSETRON HCL 4 MG/2ML IJ SOLN
4.0000 mg | Freq: Once | INTRAMUSCULAR | Status: AC
  Administered 2013-06-11: 4 mg via INTRAVENOUS
  Filled 2013-06-11: qty 2

## 2013-06-11 NOTE — ED Notes (Signed)
Pt c/o joint pain x3 days.  Reports hx of lupus. PCP reports possible ca cells in liver.

## 2013-06-11 NOTE — ED Provider Notes (Signed)
History    CSN: [id number] Arrival date & time 06/11/13  1606  First MD Initiated Contact with Patient 06/11/13 1651     Chief Complaint  Patient presents with  . Joint Pain  . Lupus   (Consider location/radiation/quality/duration/timing/severity/associated sxs/prior Treatment) The history is provided by the patient and medical records. No language interpreter was used.    Donna [name] is a 42 y.o. female  with a hx of Lupus, endometriosis, DVT presents to the Emergency Department complaining of gradual, persistent, progressively worsening abdominal pain with associated distention, N/V beginning 3 days ago.  Pt also c/o all encompassing joint pain with associated swelling of the hands and feet beginning 5 days ago and is typical of her Lupus attacks.  Pt states the abdominal pain is not typical of her Lupus attacks. Pt denies fever, chills, headache, neck pain, chest pain, dysuria, hematuria, syncope. Nothing makes it better including warm soaks, ice and elevation of her feet.  Pt states pain is so bad that he requires help with ADLs which is atypical.     Past Medical History  Diagnosis Date  . Seizure disorder   . Lupus   . Lupus (systemic lupus erythematosus) 2007  . Lupus (systemic lupus erythematosus) 2007  . Endometriosis   . Medical history non-contributory    Past Surgical History  Procedure Laterality Date  . Abdominal hysterectomy    . Cesarean section     No family history on file. History  Substance Use Topics  . Smoking status: Never Smoker   . Smokeless tobacco: Not on file  . Alcohol Use: No   OB History   Grav Para Term Preterm Abortions TAB SAB Ect Mult Living   3 3 3       3      Review of Systems  Constitutional: Negative for fever, diaphoresis, appetite change, fatigue and unexpected weight change.  HENT: Negative for mouth sores and neck stiffness.   Eyes: Negative for visual disturbance.  Respiratory: Negative for cough, chest tightness,  shortness of breath and wheezing.   Cardiovascular: Positive for leg swelling (ankles). Negative for chest pain.  Gastrointestinal: Positive for nausea, vomiting, abdominal pain and abdominal distention. Negative for constipation.  Endocrine: Negative for polydipsia, polyphagia and polyuria.  Genitourinary: Negative for dysuria, urgency, frequency and hematuria.  Musculoskeletal: Positive for arthralgias. Negative for back pain.  Skin: Negative for rash.  Allergic/Immunologic: Negative for immunocompromised state.  Neurological: Negative for syncope, light-headedness and headaches.  Hematological: Does not bruise/bleed easily.  Psychiatric/Behavioral: Negative for sleep disturbance. The patient is not nervous/anxious.     Allergies  Omnipaque; Compazine; Lactose intolerance (gi); Nsaids; and Reglan  Home Medications   Current Outpatient Rx  Name  Route  Sig  Dispense  Refill  . acetaminophen (TYLENOL) 500 MG tablet   Oral   Take 1,000 mg by mouth every 6 (six) hours as needed for pain.         . Cholecalciferol (VITAMIN D PO)   Oral   Take 1 tablet by mouth daily.         . clonazePAM (KLONOPIN) 0.5 MG tablet   Oral   Take 0.5 mg by mouth daily as needed for anxiety.          . docusate sodium (COLACE) 100 MG capsule   Oral   Take 100 mg by mouth daily as needed for constipation.         . ondansetron (ZOFRAN-ODT) 4 MG disintegrating tablet  Oral   Take 1 tablet (4 mg total) by mouth every 6 (six) hours as needed.   30 tablet   0   . polyethylene glycol (MIRALAX / GLYCOLAX) packet   Oral   Take 17 g by mouth daily as needed (constipation).         . promethazine (PHENERGAN) 25 MG tablet   Oral   Take 1 tablet (25 mg total) by mouth every 6 (six) hours as needed.   30 tablet   0   . thiamine (VITAMIN B-1) 100 MG tablet   Oral   Take 100 mg by mouth daily.          BP 125/75  Pulse 101  Temp(Src) 99.1 F (37.3 C) (Oral)  Resp 20  SpO2  100% Physical Exam  Nursing note and vitals reviewed. Constitutional: She is oriented to person, place, and time. She appears well-developed and well-nourished. No distress.  HENT:  Head: Normocephalic and atraumatic.  Mouth/Throat: Oropharynx is clear and moist. No oropharyngeal exudate.  Eyes: Conjunctivae are normal. Pupils are equal, round, and reactive to light. No scleral icterus.  Neck: Normal range of motion. Neck supple.  Cardiovascular: Regular rhythm, normal heart sounds and intact distal pulses.  Tachycardia present.   No murmur heard. Pulses:      Radial pulses are 2+ on the right side, and 2+ on the left side.       Dorsalis pedis pulses are 2+ on the right side, and 2+ on the left side.       Posterior tibial pulses are 2+ on the right side, and 2+ on the left side.  Pulmonary/Chest: No accessory muscle usage. Tachypnea noted. No respiratory distress. She has no decreased breath sounds. She has no wheezes. She has no rhonchi. She has no rales. She exhibits no tenderness and no bony tenderness.  Pt tachypneic taking shallow breaths  Abdominal: Soft. Bowel sounds are normal. She exhibits distension. She exhibits no mass. There is generalized tenderness. There is rigidity. There is no rebound, no guarding and no CVA tenderness.  Musculoskeletal: Normal range of motion. She exhibits no edema.  Lymphadenopathy:    She has no cervical adenopathy.  Neurological: She is alert and oriented to person, place, and time. She exhibits normal muscle tone. Coordination normal.  Speech is clear and goal oriented Moves extremities without ataxia  Skin: Skin is warm and dry. No rash noted. She is not diaphoretic. No erythema.  Psychiatric: She has a normal mood and affect. Her behavior is normal.    ED Course  Procedures (including critical care time) Labs Reviewed  CBC WITH DIFFERENTIAL - Abnormal; Notable for the following:    Hemoglobin 11.3 (*)    HCT 34.9 (*)    All other  components within normal limits  COMPREHENSIVE METABOLIC PANEL - Abnormal; Notable for the following:    Sodium 134 (*)    Total Bilirubin 0.2 (*)    All other components within normal limits  LIPASE, BLOOD  URINALYSIS, ROUTINE W REFLEX MICROSCOPIC   Ct Abdomen Pelvis W Contrast  06/11/2013   *RADIOLOGY REPORT*  Clinical Data: Abdominal pain.  Joint pain.  History of lupus.  CT ABDOMEN AND PELVIS WITH CONTRAST  Technique:  Multidetector CT imaging of the abdomen and pelvis was performed following the standard protocol during bolus administration of intravenous contrast.  Contrast: OMNIPAQUE IOHEXOL 300 MG/ML  SOLN  Comparison: 03/27/2013.  Findings: Lung Bases: Dependent atelectasis.  No definite fibrotic changes of the  lungs.  Tiny area of subpleural scarring in the right middle lobe.  Patulous distal thoracic esophagus with fluid level.  Liver:  Normal.  Spleen:  Normal.  Gallbladder:  Normal.  Common bile duct:  Normal.  Pancreas:  Normal.  Adrenal glands:  Normal bilaterally.  Kidneys:  Normal enhancement.  Left interpolar renal cyst.  Early excretion of contrast due to the hand injection of intravenous contrast.  The ureters are within normal limits.  Stomach:  Normal.  Small bowel:  Normal.  Colon:   Proximal colon distended with oral contrast.  Contrast extends to the rectosigmoid.  No obstruction. Normal appendix.  Pelvic Genitourinary:  Right adnexal multicystic lesion is present. In the long axis, this measures 7 cm.  On axial imaging, the aggregate measurements are 9.4 cm AP by 3.2 cm transverse.  The patient appears to be status post hysterectomy.  Bones:  No aggressive osseous lesions.  Vasculature: Normal.  Body Wall: Scarring is present in the periumbilical region.  IMPRESSION: 1.  No acute abnormality.  2.  Unchanged apparent right hydrosalpinx. Hysterectomy. 3.  Normal appendix. 4.  Left interpolar renal cyst.   Original Report Authenticated By: Andreas Newport, M.D.   Dg Abd Acute  W/chest  06/11/2013   *RADIOLOGY REPORT*  Clinical Data: Abdominal distention.  Pain.  ACUTE ABDOMEN SERIES (ABDOMEN 2 VIEW & CHEST 1 VIEW)  Comparison: CT abdomen and pelvis 03/27/2013 and PA and lateral chest 06/10/2011.  Findings: Single view of the chest demonstrates clear lungs and normal heart size.  No pneumothorax or pleural fluid.  Two views of the abdomen show no free intraperitoneal air or evidence of bowel obstruction.  The patient has a very large stool burden throughout the colon.  IMPRESSION:  1.  No acute finding. 2.  Very large stool burden throughout the colon.   Original Report Authenticated By: Holley Dexter, M.D.   1. Lupus (systemic lupus erythematosus)   2. Joint pain   3. Abdominal pain     MDM  Louis Matte presents with hx of Lupus and states that stays flare is similar to others except for the increased pain. Patient additionally has significant abdominal pain nausea and vomiting which is not typical for her lupus flares.  Patient with mild peripheral edema of the feet and hands; she reports this is normal with her flares.  We'll give pain control, check labs and imaging.   11:30PM Pt Acute abd series with large stool burden.  CT abd with Unchanged apparent right hydrosalpinx; Hysterectomy; Normal appendix; Left interpolar renal cyst.  No acute abnormality.  I personally reviewed the imaging tests through PACS system.  I reviewed available ER/hospitalization records through the EMR.  Pt pain controlled at this time; abd rigidity resolved with pain control.  Patient is nontoxic, nonseptic appearing, no longer in distress.  Patient's pain and other symptoms adequately managed in emergency department.  Fluid bolus given.  Labs, imaging and vitals reviewed.  Patient does not meet the SIRS or Sepsis criteria.  On repeat exam patient does not have a surgical abdomin and there are no peritoneal signs.  No indication of appendicitis, bowel obstruction, bowel perforation,  cholecystitis, diverticulitis. Patient discharged home with symptomatic treatment and given strict instructions for follow-up with Rheumatology.  I have also discussed reasons to return immediately to the ER.  Patient expresses understanding and agrees with plan.          Dahlia Client Jilene Spohr, PA-C 06/12/13 [phone]

## 2013-06-13 NOTE — ED Provider Notes (Signed)
History/physical exam/procedure(s) were performed by non-physician practitioner and as supervising physician I was immediately available for consultation/collaboration. I have reviewed all notes and am in agreement with care and plan.   Hilario Quarry, MD 06/13/13 [phone]

## 2013-06-28 ENCOUNTER — Inpatient Hospital Stay (HOSPITAL_COMMUNITY)
Admission: AD | Admit: 2013-06-28 | Discharge: 2013-06-28 | Disposition: A | Discharge status: Home / Self Care | Payer: Medicaid Other | Source: Ambulatory Visit | Attending: Family Medicine | Admitting: Family Medicine

## 2013-06-28 ENCOUNTER — Encounter (HOSPITAL_COMMUNITY): Payer: Self-pay | Admitting: *Deleted

## 2013-06-28 ENCOUNTER — Inpatient Hospital Stay (HOSPITAL_COMMUNITY): Payer: Medicaid Other

## 2013-06-28 DIAGNOSIS — N7013 Chronic salpingitis and oophoritis: Secondary | ICD-10-CM | POA: Insufficient documentation

## 2013-06-28 DIAGNOSIS — R1031 Right lower quadrant pain: Secondary | ICD-10-CM | POA: Insufficient documentation

## 2013-06-28 DIAGNOSIS — Q619 Cystic kidney disease, unspecified: Secondary | ICD-10-CM | POA: Insufficient documentation

## 2013-06-28 DIAGNOSIS — R112 Nausea with vomiting, unspecified: Secondary | ICD-10-CM | POA: Insufficient documentation

## 2013-06-28 DIAGNOSIS — N7011 Chronic salpingitis: Secondary | ICD-10-CM

## 2013-06-28 LAB — CBC
MCH: 27 pg (ref 26.0–34.0)
Platelets: 335 10*3/uL (ref 150–400)
RBC: 4.11 MIL/uL (ref 3.87–5.11)
WBC: 8.7 10*3/uL (ref 4.0–10.5)

## 2013-06-28 LAB — URINALYSIS, ROUTINE W REFLEX MICROSCOPIC
Bilirubin Urine: NEGATIVE
Glucose, UA: NEGATIVE mg/dL
Specific Gravity, Urine: >1.03 — ABNORMAL HIGH (ref 1.005–1.030)
pH: 6 (ref 5.0–8.0)

## 2013-06-28 LAB — COMPREHENSIVE METABOLIC PANEL
ALT: 10 U/L (ref 0–35)
AST: 13 U/L (ref 0–37)
CO2: 25 mEq/L (ref 19–32)
Calcium: 9.7 mg/dL (ref 8.4–10.5)
Chloride: 99 mEq/L (ref 96–112)
GFR calc non Af Amer: >90 mL/min (ref >90)
Sodium: 135 mEq/L (ref 135–145)

## 2013-06-28 LAB — URINE MICROSCOPIC-ADD ON

## 2013-06-28 MED ORDER — HYDROMORPHONE HCL PF 1 MG/ML IJ SOLN
1.0000 mg | Freq: Once | INTRAMUSCULAR | Status: AC
  Administered 2013-06-28: 1 mg via INTRAVENOUS
  Filled 2013-06-28: qty 1

## 2013-06-28 MED ORDER — IOHEXOL 300 MG/ML  SOLN
100.0000 mL | Freq: Once | INTRAMUSCULAR | Status: AC | PRN
  Administered 2013-06-28: 100 mL via INTRAVENOUS

## 2013-06-28 MED ORDER — DIPHENHYDRAMINE HCL 50 MG/ML IJ SOLN
50.0000 mg | Freq: Once | INTRAMUSCULAR | Status: AC
  Administered 2013-06-28: 50 mg via INTRAVENOUS
  Filled 2013-06-28: qty 1

## 2013-06-28 MED ORDER — HYDROMORPHONE HCL PF 2 MG/ML IJ SOLN
2.0000 mg | Freq: Once | INTRAMUSCULAR | Status: AC
  Administered 2013-06-28: 2 mg via INTRAMUSCULAR
  Filled 2013-06-28: qty 1

## 2013-06-28 MED ORDER — DIPHENHYDRAMINE HCL 50 MG/ML IJ SOLN
25.0000 mg | Freq: Once | INTRAMUSCULAR | Status: AC
  Administered 2013-06-28: 25 mg via INTRAVENOUS
  Filled 2013-06-28: qty 1

## 2013-06-28 MED ORDER — HYDROMORPHONE HCL PF 2 MG/ML IJ SOLN
2.0000 mg | INTRAMUSCULAR | Status: DC | PRN
  Administered 2013-06-28: 2 mg via INTRAVENOUS
  Filled 2013-06-28: qty 1

## 2013-06-28 MED ORDER — LACTATED RINGERS IV SOLN
INTRAVENOUS | Status: DC
  Administered 2013-06-28: 06:00:00 via INTRAVENOUS

## 2013-06-28 MED ORDER — HYDROCORTISONE SOD SUCCINATE 100 MG IJ SOLR
200.0000 mg | Freq: Once | INTRAMUSCULAR | Status: AC
  Administered 2013-06-28: 200 mg via INTRAVENOUS
  Filled 2013-06-28: qty 4

## 2013-06-28 MED ORDER — SODIUM CHLORIDE 0.9 % IJ SOLN
INTRAMUSCULAR | Status: AC
  Filled 2013-06-28: qty 3

## 2013-06-28 MED ORDER — OXYCODONE-ACETAMINOPHEN 5-325 MG PO TABS: 1.0000 | ORAL_TABLET | ORAL | Status: DC | PRN

## 2013-06-28 MED ORDER — ONDANSETRON 8 MG PO TBDP
8.0000 mg | ORAL_TABLET | Freq: Once | ORAL | Status: AC
  Administered 2013-06-28: 8 mg via ORAL
  Filled 2013-06-28: qty 1

## 2013-06-28 MED ORDER — ONDANSETRON HCL 4 MG/2ML IJ SOLN: 4.0000 mg | Freq: Four times a day (QID) | INTRAMUSCULAR | Status: DC | PRN

## 2013-06-28 MED ORDER — IOHEXOL 300 MG/ML  SOLN
50.0000 mL | INTRAMUSCULAR | Status: AC
  Administered 2013-06-28: 50 mL via ORAL

## 2013-06-28 MED ORDER — PROMETHAZINE HCL 25 MG/ML IJ SOLN
12.5000 mg | Freq: Once | INTRAMUSCULAR | Status: AC
  Administered 2013-06-28: 12.5 mg via INTRAVENOUS
  Filled 2013-06-28: qty 1

## 2013-06-28 MED ORDER — OXYCODONE-ACETAMINOPHEN 5-325 MG PO TABS
2.0000 | ORAL_TABLET | Freq: Once | ORAL | Status: DC
  Filled 2013-06-28: qty 2

## 2013-06-28 NOTE — MAU Note (Signed)
IV fluids switched to left arm iv access.

## 2013-06-28 NOTE — MAU Note (Signed)
Pt understands need for additional iv benadryl per radiology, voices understanding of care plan.

## 2013-06-28 NOTE — MAU Provider Note (Signed)
Chief Complaint: Abdominal Pain  First Provider Initiated Contact with Patient 06/28/13 0241      SUBJECTIVE HPI: Donna [name] is a 42 y.o. [phone] non-pregnant female who presents with severe pain throughout entire RLQ, nausea and vomiting x 2 days and fever of 104 06/25/13. No relief of pain w/ Tylenol. Last dose 06/26/13. State she has a Hx of endometriosis and scar tissue and was told that she would need surgery, but went to Gynecologist in [place] 2 1/2 weeks ago and was told that they could not operate on her. Pain is similar to previous episodes since 03/2013. Usually has N/V that she attributes to severe pain.   Hx Hysterectomy and LSO. [place] 03/27/13, CT 03/27/13, [place] 05/20/13 and CT 06/11/13 all show stable hydrosalpinx 7.7 x 6.8 x 3.5 cm. No evidence of torsion, normal appendix (where applicable).      Past Medical History  Diagnosis Date  . Seizure disorder   . Lupus   . Lupus (systemic lupus erythematosus) 2007  . Lupus (systemic lupus erythematosus) 2007  . Endometriosis   . Medical history non-contributory    OB History   Grav Para Term Preterm Abortions TAB SAB Ect Mult Living   3 3 3       3      # Outc Date GA Lbr Len/2nd Wgt Sex Del Anes PTL Lv   1 TRM            2 TRM            3 TRM              Past Surgical History  Procedure Laterality Date  . Abdominal hysterectomy    . Cesarean section     History   Social History  . Marital Status: Divorced    Spouse Name: N/A    Number of Children: N/A  . Years of Education: N/A   Occupational History  . Not on file.   Social History Main Topics  . Smoking status: Never Smoker   . Smokeless tobacco: Not on file  . Alcohol Use: No  . Drug Use: No  . Sexually Active: Yes    Birth Control/ Protection: None   Other Topics Concern  . Not on file   Social History Narrative  . No narrative on file   No current facility-administered medications on file prior to encounter.   Current Outpatient Prescriptions  on File Prior to Encounter  Medication Sig Dispense Refill  . acetaminophen (TYLENOL) 500 MG tablet Take 1,000 mg by mouth every 6 (six) hours as needed for pain.      . Cholecalciferol (VITAMIN D PO) Take 1 tablet by mouth daily.      . clonazePAM (KLONOPIN) 0.5 MG tablet Take 0.5 mg by mouth daily as needed for anxiety.       . docusate sodium (COLACE) 100 MG capsule Take 100 mg by mouth daily as needed for constipation.      . ondansetron (ZOFRAN-ODT) 4 MG disintegrating tablet Take 1 tablet (4 mg total) by mouth every 6 (six) hours as needed.  30 tablet  0  . oxyCODONE-acetaminophen (PERCOCET/ROXICET) 5-325 MG per tablet Take 1 tablet by mouth every 4 (four) hours as needed for pain.  15 tablet  0  . polyethylene glycol (MIRALAX / GLYCOLAX) packet Take 17 g by mouth daily as needed (constipation).      . promethazine (PHENERGAN) 25 MG tablet Take 1 tablet (25 mg total) by mouth  every 6 (six) hours as needed.  30 tablet  0  . thiamine (VITAMIN B-1) 100 MG tablet Take 100 mg by mouth daily.       Allergies  Allergen Reactions  . Omnipaque (Iohexol) Hives and Itching    Pt given Benadryl after reaction (at [facility]); IV Benadryl given prior to CT 03/27/2013; pt tolerated procedure without problems.  . Compazine (Prochlorperazine) Hives  . Lactose Intolerance (Gi) Nausea And Vomiting  . Nsaids Hives  . Reglan (Metoclopramide) Hives    ROS: Pertinent positives tems in HPI. Neg for constipation, diarrhea, urinary complaints, vaginal bleeding, vaginal discharge, bloody stools, sick contacts. No relationship btw eating and vomiting.   OBJECTIVE Temperature 98.4 F (36.9 C), temperature source Oral, resp. rate 20, height 5\' 1"  (1.549 m), weight 74.844 kg (165 lb), SpO2 100.00%.  GENERAL: Well-developed, well-nourished female in severe distress.  HEENT: Normocephalic HEART: normal rate RESP: normal effort ABDOMEN: Soft, moderately tender throughout entire RLQ. Normal BS x 4. Neg rebound. Pos  guarding. No mass palpated, but entire exam limited pt pt intolerance of lying on back.  EXTREMITIES: Nontender, no edema NEURO: Alert and oriented SPECULUM EXAM: Deferred due to pain. Will perform after pain meds working.   LAB RESULTS Results for orders placed during the hospital encounter of 06/28/13 (from the past 24 hour(s))  URINALYSIS, ROUTINE W REFLEX MICROSCOPIC     Status: Abnormal   Collection Time    06/28/13 12:55 AM      Result Value Range   Color, Urine YELLOW  YELLOW   APPearance CLEAR  CLEAR   Specific Gravity, Urine >1.030 (*) 1.005 - 1.030   pH 6.0  5.0 - 8.0   Glucose, UA NEGATIVE  NEGATIVE mg/dL   Hgb urine dipstick TRACE (*) NEGATIVE   Bilirubin Urine NEGATIVE  NEGATIVE   Ketones, ur NEGATIVE  NEGATIVE mg/dL   Protein, ur NEGATIVE  NEGATIVE mg/dL   Urobilinogen, UA 0.2  0.0 - 1.0 mg/dL   Nitrite NEGATIVE  NEGATIVE   Leukocytes, UA NEGATIVE  NEGATIVE  URINE MICROSCOPIC-ADD ON     Status: Abnormal   Collection Time    06/28/13 12:55 AM      Result Value Range   Squamous Epithelial / LPF FEW (*) RARE   WBC, UA 0-2  <3 WBC/hpf   RBC / HPF 0-2  <3 RBC/hpf   Bacteria, UA FEW (*) RARE  CBC     Status: Abnormal   Collection Time    06/28/13  2:55 AM      Result Value Range   WBC 8.7  4.0 - 10.5 K/uL   RBC 4.11  3.87 - 5.11 MIL/uL   Hemoglobin 11.1 (*) 12.0 - 15.0 g/dL   HCT 16.1 (*) 09.6 - 04.5 %   MCV 82.7  78.0 - 100.0 fL   MCH 27.0  26.0 - 34.0 pg   MCHC 32.6  30.0 - 36.0 g/dL   RDW 40.9  81.1 - 91.4 %   Platelets 335  150 - 400 K/uL  COMPREHENSIVE METABOLIC PANEL     Status: Abnormal   Collection Time    06/28/13  2:55 AM      Result Value Range   Sodium 135  135 - 145 mEq/L   Potassium 3.3 (*) 3.5 - 5.1 mEq/L   Chloride 99  96 - 112 mEq/L   CO2 25  19 - 32 mEq/L   Glucose, Bld 110 (*) 70 - 99 mg/dL   BUN 14  6 - 23 mg/dL   Creatinine, Ser 4.54  0.50 - 1.10 mg/dL   Calcium 9.7  8.4 - 09.8 mg/dL   Total Protein 6.9  6.0 - 8.3 g/dL   Albumin  4.1  3.5 - 5.2 g/dL   AST 13  0 - 37 U/L   ALT 10  0 - 35 U/L   Alkaline Phosphatase 76  39 - 117 U/L   Total Bilirubin 0.2 (*) 0.3 - 1.2 mg/dL   GFR calc non Af Amer >90  >90 mL/min   GFR calc Af Amer >90  >90 mL/min   MAU COURSE 0220: Zofran and Dilaudid ordered. RN's attempting IV start. Need to R/O appendicitis and ovarian torsion, re-eval hydrosalpinx. Discussed choice of imaging w/ Radiologist. Torsion highly unlikely in pts situation. Recommends CT w/ Contrast. Premedicate w/ Benadryl. Dr. Shawnie Pons consulted. Agrees w/ POC.   [phone]: Minimal improvement in pain. No improvement in N/V. Dialudid 1 mg, Phenergan 12.5 mg IV ordered. Will premedicate for contrast w/ Benadryl.   0700: Pain returned. Pt relaxed-appearing. Awaiting CT. Radiologist coming to [facility] for CT due concerns about Hx Omnipaque allergy. Dilaudid ordered.  0800: Care of pt turned over to Joseph Berkshire, [place]. Pt in CT.  [place], CNM 06/28/2013  8:29 AM   0800 - Care assumed from [place], CNM  0900 - Advised that Radiology insists patient is premedicated with 50 mg IV Benadryl and 200 mg Hydrocortisone IV prior to contrast administration. Order entered.   Results for orders placed during the hospital encounter of 06/28/13 (from the past 24 hour(s))  URINALYSIS, ROUTINE W REFLEX MICROSCOPIC     Status: Abnormal   Collection Time    06/28/13 12:55 AM      Result Value Range   Color, Urine YELLOW  YELLOW   APPearance CLEAR  CLEAR   Specific Gravity, Urine >1.030 (*) 1.005 - 1.030   pH 6.0  5.0 - 8.0   Glucose, UA NEGATIVE  NEGATIVE mg/dL   Hgb urine dipstick TRACE (*) NEGATIVE   Bilirubin Urine NEGATIVE  NEGATIVE   Ketones, ur NEGATIVE  NEGATIVE mg/dL   Protein, ur NEGATIVE  NEGATIVE mg/dL   Urobilinogen, UA 0.2  0.0 - 1.0 mg/dL   Nitrite NEGATIVE  NEGATIVE   Leukocytes, UA NEGATIVE  NEGATIVE  URINE MICROSCOPIC-ADD ON     Status: Abnormal   Collection Time    06/28/13 12:55 AM      Result Value Range    Squamous Epithelial / LPF FEW (*) RARE   WBC, UA 0-2  <3 WBC/hpf   RBC / HPF 0-2  <3 RBC/hpf   Bacteria, UA FEW (*) RARE  CBC     Status: Abnormal   Collection Time    06/28/13  2:55 AM      Result Value Range   WBC 8.7  4.0 - 10.5 K/uL   RBC 4.11  3.87 - 5.11 MIL/uL   Hemoglobin 11.1 (*) 12.0 - 15.0 g/dL   HCT 47.8 (*) 29.5 - 62.1 %   MCV 82.7  78.0 - 100.0 fL   MCH 27.0  26.0 - 34.0 pg   MCHC 32.6  30.0 - 36.0 g/dL   RDW 30.8  65.7 - 84.6 %   Platelets 335  150 - 400 K/uL  COMPREHENSIVE METABOLIC PANEL     Status: Abnormal   Collection Time    06/28/13  2:55 AM      Result Value Range   Sodium 135  135 -  145 mEq/L   Potassium 3.3 (*) 3.5 - 5.1 mEq/L   Chloride 99  96 - 112 mEq/L   CO2 25  19 - 32 mEq/L   Glucose, Bld 110 (*) 70 - 99 mg/dL   BUN 14  6 - 23 mg/dL   Creatinine, Ser 1.61  0.50 - 1.10 mg/dL   Calcium 9.7  8.4 - 09.6 mg/dL   Total Protein 6.9  6.0 - 8.3 g/dL   Albumin 4.1  3.5 - 5.2 g/dL   AST 13  0 - 37 U/L   ALT 10  0 - 35 U/L   Alkaline Phosphatase 76  39 - 117 U/L   Total Bilirubin 0.2 (*) 0.3 - 1.2 mg/dL   GFR calc non Af Amer >90  >90 mL/min   GFR calc Af Amer >90  >90 mL/min    Ct Abdomen Pelvis W Contrast  06/28/2013   *RADIOLOGY REPORT*  Clinical Data: Right lower quadrant pain.  CT ABDOMEN AND PELVIS WITH CONTRAST  Technique:  Multidetector CT imaging of the abdomen and pelvis was performed following the standard protocol during bolus administration of intravenous contrast.  Contrast: OMNIPAQUE IOHEXOL 300 MG/ML  SOLN  Comparison: CT abdomen and pelvis 06/11/2013 and 03/27/2013. Pelvic ultrasound 03/27/2013.  Findings: Atelectatic change is seen in the lung bases.  No pleural or pericardial effusion.  The gallbladder, liver, spleen, adrenal glands, pancreas and right kidney appear normal.  A small left renal cyst is unchanged.  A moderately large stool burden is present in the transverse colon through the rectum.  The appendix appears normal.   Stomach and small bowel appear normal.  There is a small volume of free pelvic fluid.  As seen on prior studies, a fluid filled tubular structure in the right hemi pelvis is unchanged and likely represents a hydrosalpinx.  The left ovary is unremarkable.  The uterus has been removed.  A small volume of free pelvic fluid is noted.  IMPRESSION:  1.  Normal-appearing appendix.  No acute finding. 2.  Tubular fluid filled structure in the right adnexa is not markedly changed and likely due to hydrosalpinx.   Original Report Authenticated By: Holley Dexter, M.D.   MDM Discussed patient and results with Dr. Debroah Loop. Ok for discharge. Rx for Percocet. Follow-up in [facility] clinic for surgical consult.  A: Right hydrosalpinx, stable  P: Discharge home Rx for Percocet given to patient Patient referred to [facility] clinic for surgical consult and follow-up Patient may return to MAU as needed or if her condition were to change or worsen  Freddi Starr, PA-C 06/28/2013 9:00 AM

## 2013-06-28 NOTE — MAU Note (Signed)
Pt states "I have endometriosis." I still need to have the surgery" pt states that scar tissue is so severe that she was told at [facility] that they could not operate on her. Pt states that when is gets so bad that the best she can do is come to the hospital to get comfortable,

## 2013-06-29 NOTE — MAU Provider Note (Signed)
Chart reviewed and agree with management and plan.  

## 2013-07-21 ENCOUNTER — Inpatient Hospital Stay (HOSPITAL_COMMUNITY): Payer: Medicaid Other

## 2013-07-21 ENCOUNTER — Inpatient Hospital Stay (HOSPITAL_COMMUNITY)
Admission: AD | Admit: 2013-07-21 | Discharge: 2013-07-22 | Disposition: A | Discharge status: Home / Self Care | Payer: Medicaid Other | Source: Ambulatory Visit | Attending: Obstetrics and Gynecology | Admitting: Obstetrics and Gynecology

## 2013-07-21 ENCOUNTER — Encounter (HOSPITAL_COMMUNITY): Payer: Self-pay

## 2013-07-21 DIAGNOSIS — R109 Unspecified abdominal pain: Secondary | ICD-10-CM | POA: Insufficient documentation

## 2013-07-21 DIAGNOSIS — N7013 Chronic salpingitis and oophoritis: Secondary | ICD-10-CM | POA: Insufficient documentation

## 2013-07-21 DIAGNOSIS — N390 Urinary tract infection, site not specified: Secondary | ICD-10-CM | POA: Insufficient documentation

## 2013-07-21 LAB — CBC WITH DIFFERENTIAL/PLATELET
Hemoglobin: 12.5 g/dL (ref 12.0–15.0)
Lymphocytes Relative: 28 % (ref 12–46)
Lymphs Abs: 4.2 10*3/uL — ABNORMAL HIGH (ref 0.7–4.0)
MCH: 27.3 pg (ref 26.0–34.0)
Monocytes Relative: 7 % (ref 3–12)
Neutro Abs: 9.6 10*3/uL — ABNORMAL HIGH (ref 1.7–7.7)
Neutrophils Relative %: 64 % (ref 43–77)
RBC: 4.58 MIL/uL (ref 3.87–5.11)
WBC: 15 10*3/uL — ABNORMAL HIGH (ref 4.0–10.5)

## 2013-07-21 LAB — URINALYSIS, ROUTINE W REFLEX MICROSCOPIC
Bilirubin Urine: NEGATIVE
Specific Gravity, Urine: >1.03 — ABNORMAL HIGH (ref 1.005–1.030)
Urobilinogen, UA: 0.2 mg/dL (ref 0.0–1.0)
pH: 6 (ref 5.0–8.0)

## 2013-07-21 LAB — COMPREHENSIVE METABOLIC PANEL
ALT: 12 U/L (ref 0–35)
Alkaline Phosphatase: 81 U/L (ref 39–117)
BUN: 25 mg/dL — ABNORMAL HIGH (ref 6–23)
CO2: 27 mEq/L (ref 19–32)
Chloride: 101 mEq/L (ref 96–112)
GFR calc Af Amer: >90 mL/min (ref >90)
Glucose, Bld: 128 mg/dL — ABNORMAL HIGH (ref 70–99)
Potassium: 3.3 mEq/L — ABNORMAL LOW (ref 3.5–5.1)
Total Bilirubin: 0.2 mg/dL — ABNORMAL LOW (ref 0.3–1.2)

## 2013-07-21 LAB — WET PREP, GENITAL: Yeast Wet Prep HPF POC: NONE SEEN

## 2013-07-21 LAB — URINE MICROSCOPIC-ADD ON

## 2013-07-21 MED ORDER — LACTATED RINGERS IV BOLUS (SEPSIS): 1000.0000 mL | Freq: Once | INTRAVENOUS | Status: DC

## 2013-07-21 MED ORDER — CIPROFLOXACIN HCL 500 MG PO TABS
500.0000 mg | ORAL_TABLET | Freq: Once | ORAL | Status: DC
  Filled 2013-07-21: qty 1

## 2013-07-21 MED ORDER — PHENAZOPYRIDINE HCL 100 MG PO TABS: 200.0000 mg | ORAL_TABLET | Freq: Once | ORAL | Status: DC

## 2013-07-21 MED ORDER — PROMETHAZINE HCL 25 MG/ML IJ SOLN
25.0000 mg | Freq: Once | INTRAMUSCULAR | Status: AC
  Administered 2013-07-21: 25 mg via INTRAMUSCULAR
  Filled 2013-07-21: qty 1

## 2013-07-21 MED ORDER — HYDROMORPHONE HCL PF 1 MG/ML IJ SOLN
1.0000 mg | Freq: Once | INTRAMUSCULAR | Status: AC
  Administered 2013-07-21: 1 mg via INTRAVENOUS
  Filled 2013-07-21: qty 1

## 2013-07-21 MED ORDER — CIPROFLOXACIN HCL 500 MG PO TABS: 500.0000 mg | ORAL_TABLET | Freq: Two times a day (BID) | ORAL | Status: DC

## 2013-07-21 MED ORDER — ONDANSETRON 8 MG PO TBDP: 8.0000 mg | ORAL_TABLET | Freq: Three times a day (TID) | ORAL | Status: DC | PRN

## 2013-07-21 MED ORDER — PHENAZOPYRIDINE HCL 200 MG PO TABS: 200.0000 mg | ORAL_TABLET | Freq: Three times a day (TID) | ORAL | Status: DC | PRN

## 2013-07-21 MED ORDER — DEXTROSE 5 % IV SOLN
1.0000 g | Freq: Once | INTRAVENOUS | Status: AC
  Administered 2013-07-21: 1 g via INTRAVENOUS
  Filled 2013-07-21: qty 10

## 2013-07-21 MED ORDER — HYDROMORPHONE HCL PF 1 MG/ML IJ SOLN
1.0000 mg | Freq: Once | INTRAMUSCULAR | Status: AC
  Administered 2013-07-21: 1 mg via INTRAMUSCULAR
  Filled 2013-07-21: qty 1

## 2013-07-21 MED ORDER — SODIUM CHLORIDE 0.9 % IV BOLUS (SEPSIS)
1000.0000 mL | Freq: Once | INTRAVENOUS | Status: AC
  Administered 2013-07-21: 1000 mL via INTRAVENOUS

## 2013-07-21 NOTE — MAU Note (Signed)
Pt states went to neurologist and was given NSAID's, is severely allergic, awoke in ICU, was there 2 days, since then has been unable to keep food and water down. Having severe r sided flank pain x5 days. Notes pain with voiding. Denies vag d/c changes or bleeding.

## 2013-07-21 NOTE — MAU Provider Note (Signed)
History     CSN: [id number]  Arrival date and time: 07/21/13 1614   None     Chief Complaint  Patient presents with  . Emesis  . Abdominal Pain   HPI Donna [name] is 42 y.o. [phone]  presenting with abdominal pain.  She was seen at [facility] 4 days ago for lumbar puncture that she gets monthly.  Gave her a new med that had NSAIDs in it, she went into anaphylaxis and was ICU.  Discharged yesterday.  She began her care at [facility] when she lived there.   She has a hx of Lupus and endometriosis. Hx of hysterectomy and Left SO.   She has been told she will need surgery.  She saw a doctor in [place] a few weeks ago and was told they could not operate on her.  She has had multiple visits and testing for abdominal pain including U/S and CT on 03/27/13; U/S on 05/20/13; CT on 06/11/13 that showed a stable hydrosalpinx, neg for torsion.  Most recent visit was 7/16-abdominal pain, nausea and fever.  CT that date showed nl appendix, stable hydrosalpinx.  She was treated with Zofran and Dilaudid.  States she is very uncomfortable X 1 week and she feels it is part female problem and Lupus.  She states she cannot take anything by mouth X 7 days because she is vomiting.  Complains of weakness.  She used to see MD at [facility] but doesn't see them anymore--plans MD here.  She has appt in the clinic 8/20.       Past Medical History  Diagnosis Date  . Seizure disorder   . Lupus   . Lupus (systemic lupus erythematosus) 2007  . Lupus (systemic lupus erythematosus) 2007  . Endometriosis     Past Surgical History  Procedure Laterality Date  . Abdominal hysterectomy    . Cesarean section      No family history on file.  History  Substance Use Topics  . Smoking status: Never Smoker   . Smokeless tobacco: Not on file  . Alcohol Use: No    Allergies:  Allergies  Allergen Reactions  . Nsaids Anaphylaxis  . Omnipaque (Iohexol) Hives and Itching    Pt given Benadryl after reaction (at [facility]); IV Benadryl  given prior to CT 03/27/2013; pt tolerated procedure without problems.  . Compazine (Prochlorperazine) Hives  . Lactose Intolerance (Gi) Nausea And Vomiting  . Reglan (Metoclopramide) Hives    Prescriptions prior to admission  Medication Sig Dispense Refill  . acetaminophen (TYLENOL) 500 MG tablet Take 1,000 mg by mouth every 6 (six) hours as needed for pain.      . Cholecalciferol (VITAMIN D PO) Take 1 tablet by mouth daily.      . clonazePAM (KLONOPIN) 0.5 MG tablet Take 0.5 mg by mouth daily as needed for anxiety.       . Cyanocobalamin (VITAMIN B-12 PO) Take 1 tablet by mouth daily.      Marland Kitchen docusate sodium (COLACE) 100 MG capsule Take 100 mg by mouth daily as needed for constipation.      . ondansetron (ZOFRAN-ODT) 4 MG disintegrating tablet Take 1 tablet (4 mg total) by mouth every 6 (six) hours as needed.  30 tablet  0  . oxyCODONE-acetaminophen (PERCOCET/ROXICET) 5-325 MG per tablet Take 1-2 tablets by mouth every 4 (four) hours as needed for pain.  20 tablet  0  . polyethylene glycol (MIRALAX / GLYCOLAX) packet Take 17 g by mouth daily as  needed (constipation).      . promethazine (PHENERGAN) 25 MG tablet Take 1 tablet (25 mg total) by mouth every 6 (six) hours as needed.  30 tablet  0    Review of Systems  Constitutional: Negative for fever and chills.  Gastrointestinal: Positive for nausea, vomiting and abdominal pain.  Genitourinary: Positive for dysuria.       Denies vaginal discharge or bleeding  STates she did not urinate at all yesterday.  Neurological: Positive for weakness.   Physical Exam   Blood pressure 120/89, pulse 102, temperature 97.7 F (36.5 C), temperature source Oral, resp. rate 18, height 5\' 3"  (1.6 m), weight 158 lb 4 oz (71.782 kg).  Physical Exam  Constitutional: She is oriented to person, place, and time. She appears well-developed and well-nourished. No distress.  uncomfortable  HENT:  Head: Normocephalic.  Neck: Normal range of motion.   Cardiovascular: Normal rate.   Respiratory: Effort normal.  GI: Soft. She exhibits no distension and no mass. There is tenderness (right sided). There is guarding. There is no rebound.  Genitourinary: There is no rash, tenderness or lesion on the right labia. There is no rash, tenderness or lesion on the left labia. Cervix exhibits no motion tenderness, no discharge and no friability. Right adnexum displays tenderness. Left adnexum displays no mass, no tenderness and no fullness. No tenderness or bleeding around the vagina. No vaginal discharge found.  Cervix seen.  Difficult to fully examine patient because she is uncomfortable  Neurological: She is oriented to person, place, and time.  Skin: Skin is warm and dry.  Psychiatric: She has a normal mood and affect. Her behavior is normal.   Results for orders placed during the hospital encounter of 07/21/13 (from the past 24 hour(s))  URINALYSIS, ROUTINE W REFLEX MICROSCOPIC     Status: Abnormal   Collection Time    07/21/13  5:08 PM      Result Value Range   Color, Urine YELLOW  YELLOW   APPearance TURBID (*) CLEAR   Specific Gravity, Urine >1.030 (*) 1.005 - 1.030   pH 6.0  5.0 - 8.0   Glucose, UA NEGATIVE  NEGATIVE mg/dL   Hgb urine dipstick MODERATE (*) NEGATIVE   Bilirubin Urine NEGATIVE  NEGATIVE   Ketones, ur NEGATIVE  NEGATIVE mg/dL   Protein, ur NEGATIVE  NEGATIVE mg/dL   Urobilinogen, UA 0.2  0.0 - 1.0 mg/dL   Nitrite POSITIVE (*) NEGATIVE   Leukocytes, UA LARGE (*) NEGATIVE  URINE MICROSCOPIC-ADD ON     Status: Abnormal   Collection Time    07/21/13  5:08 PM      Result Value Range   Squamous Epithelial / LPF FEW (*) RARE   WBC, UA TOO NUMEROUS TO COUNT  <3 WBC/hpf   Bacteria, UA MANY (*) RARE  POCT PREGNANCY, URINE     Status: None   Collection Time    07/21/13  5:09 PM      Result Value Range   Preg Test, Ur NEGATIVE  NEGATIVE  CBC WITH DIFFERENTIAL     Status: Abnormal   Collection Time    07/21/13  7:45 PM       Result Value Range   WBC 15.0 (*) 4.0 - 10.5 K/uL   RBC 4.58  3.87 - 5.11 MIL/uL   Hemoglobin 12.5  12.0 - 15.0 g/dL   HCT 95.6  21.3 - 08.6 %   MCV 82.8  78.0 - 100.0 fL   MCH 27.3  26.0 -  34.0 pg   MCHC 33.0  30.0 - 36.0 g/dL   RDW 16.1  09.6 - 04.5 %   Platelets 384  150 - 400 K/uL   Neutrophils Relative % 64  43 - 77 %   Neutro Abs 9.6 (*) 1.7 - 7.7 K/uL   Lymphocytes Relative 28  12 - 46 %   Lymphs Abs 4.2 (*) 0.7 - 4.0 K/uL   Monocytes Relative 7  3 - 12 %   Monocytes Absolute 1.0  0.1 - 1.0 K/uL   Eosinophils Relative 1  0 - 5 %   Eosinophils Absolute 0.2  0.0 - 0.7 K/uL   Basophils Relative 0  0 - 1 %   Basophils Absolute 0.0  0.0 - 0.1 K/uL   MAU Course  Procedures  MDM Discussed patient with Dr. Emelda Fear.  Order ultrasound transvaginal.  Dilaudid 1mg  IM ordered Care turned over to Thressa Sheller, CNM  20:00 2229: C/W Dr. Emelda Fear: 1 L LR bolus and 1g Rocephin IV then dc home.  0050: Patient has had IV rocephin and NS bolus. She reports feeling "much better" now.   Assessment and Plan   1. UTI (lower urinary tract infection)   Chronic hydrosalpinx FU with the clinic as planned   KEY,EVE M 07/21/2013, 6:51 PM

## 2013-07-22 LAB — URINE CULTURE: Colony Count: >=100000

## 2013-07-22 LAB — GC/CHLAMYDIA PROBE AMP: CT Probe RNA: NEGATIVE

## 2013-07-23 NOTE — MAU Provider Note (Signed)
Attestation of Attending Supervision of Advanced Practitioner: Evaluation and management procedures were performed by the PA/NP/CNM/OB Fellow under my supervision/collaboration. Chart reviewed and agree with management and plan.  Alizzon Dioguardi V 07/23/2013 10:10 AM

## 2013-08-02 ENCOUNTER — Encounter: Payer: Self-pay | Admitting: Obstetrics & Gynecology

## 2013-08-02 ENCOUNTER — Ambulatory Visit (INDEPENDENT_AMBULATORY_CARE_PROVIDER_SITE_OTHER): Payer: Medicaid Other | Admitting: Obstetrics & Gynecology

## 2013-08-02 VITALS — BP 126/81 | HR 105 | Temp 97.1°F | Ht 63.0 in | Wt 160.6 lb

## 2013-08-02 DIAGNOSIS — N7011 Chronic salpingitis: Secondary | ICD-10-CM

## 2013-08-02 DIAGNOSIS — N7013 Chronic salpingitis and oophoritis: Secondary | ICD-10-CM

## 2013-08-02 NOTE — Progress Notes (Signed)
  Subjective:    Patient ID: Donna [name], female    DOB: [birth year], 42 y.o.   MRN: [id number]  HPI  42 yo M AA lady P3 (21, 28, and 44 yo kids) with lupus and a h/o endometriosis. Her gyn history is very complicated. She had a TAH with a vertical incision at [facility] 1 month after her 3 rd cesarean section. She recalls having an infection and that being the reason for her hysterectomy. She had her left ovary (? Tube) removed in 2008 for pain associated with endometriosis. She recalls that during that same surgery she had "repairs to her stomach, bladder, and kidney because the endometriosis was attatched like one big glob". The surgery took 8 hours. She has since been back to [facility] and they all have refused to operate on her. She has had many u/s and CT that consistently show a left hydrosalpinx. She tried depot lupron in 2010 for a year with no resolution of her pain. Her lupus flared during this year and she was advised by her rheumatologist to stop the lupron.  Review of Systems Married for 9 years, she stopped having sex about 5 months ago because the pain was so bad. She is now on tylenol daily, with occasional prescriptions for percocet given at the MAU. She is allergic to IBU. She had to have epinephrine and a PIC line after NSAID use in the past.    Objective:   Physical Exam        Assessment & Plan:   CPP, verbal diagnosis of endometriosis, hydrosalpinx, lupus She would like to have surgery with removal of her remaining gyn organs. I have discussed that based on what she has told me that surgery would be a very risky option. She understands the risk of bowel injury with possible colostomy and even the risk of death. I agreed to review her op note from 2008 and make a decision about whether I would agree to operate on her. She understands that if I decide to take her to the OR that this would only occur if a general surgery is operating with me. I will refer her to a gen surg  in the mean time.

## 2013-08-15 ENCOUNTER — Telehealth: Payer: Self-pay | Admitting: General Practice

## 2013-08-15 NOTE — Telephone Encounter (Signed)
Patient called and left message stating she was in the doctors office on 8/20 and needs to have surgery and was told to see a general surgeon but nothing has been set up yet and would like a call back so she knows if we are scheduling this or if she needs to. Called patient stating I was returning her phone call and apologized for any miscommunication with her referral to general surgery. Also told patient that I had called a general surgeon in [place] and left a message with them to call us back and whenever I hear back from them I will call you back and let you know further information. Patient verbalized understanding, was satisfied and had no further questions. Awaiting to hear from [facility]

## 2013-08-18 NOTE — Telephone Encounter (Signed)
Called and informed patient of appt with [facility] [place] [facility] 9/9 @ 4pm. Patient was satisfied, verbalized understanding and had no further questions

## 2013-08-19 ENCOUNTER — Other Ambulatory Visit: Payer: Self-pay

## 2013-08-19 ENCOUNTER — Emergency Department (HOSPITAL_COMMUNITY)
Admission: EM | Admit: 2013-08-19 | Discharge: 2013-08-20 | Disposition: A | Discharge status: Home / Self Care | Payer: Medicaid Other | Attending: Emergency Medicine | Admitting: Emergency Medicine

## 2013-08-19 ENCOUNTER — Encounter (HOSPITAL_COMMUNITY): Payer: Self-pay | Admitting: *Deleted

## 2013-08-19 ENCOUNTER — Emergency Department (HOSPITAL_COMMUNITY): Payer: Medicaid Other

## 2013-08-19 DIAGNOSIS — Z8739 Personal history of other diseases of the musculoskeletal system and connective tissue: Secondary | ICD-10-CM | POA: Insufficient documentation

## 2013-08-19 DIAGNOSIS — F419 Anxiety disorder, unspecified: Secondary | ICD-10-CM | POA: Diagnosis present

## 2013-08-19 DIAGNOSIS — G40909 Epilepsy, unspecified, not intractable, without status epilepticus: Secondary | ICD-10-CM | POA: Insufficient documentation

## 2013-08-19 DIAGNOSIS — F411 Generalized anxiety disorder: Secondary | ICD-10-CM | POA: Insufficient documentation

## 2013-08-19 DIAGNOSIS — R55 Syncope and collapse: Secondary | ICD-10-CM | POA: Diagnosis present

## 2013-08-19 DIAGNOSIS — Z3202 Encounter for pregnancy test, result negative: Secondary | ICD-10-CM | POA: Insufficient documentation

## 2013-08-19 DIAGNOSIS — Z8742 Personal history of other diseases of the female genital tract: Secondary | ICD-10-CM | POA: Insufficient documentation

## 2013-08-19 DIAGNOSIS — Z79899 Other long term (current) drug therapy: Secondary | ICD-10-CM | POA: Insufficient documentation

## 2013-08-19 DIAGNOSIS — R52 Pain, unspecified: Secondary | ICD-10-CM | POA: Insufficient documentation

## 2013-08-19 MED ORDER — SODIUM CHLORIDE 0.9 % IV BOLUS (SEPSIS)
1000.0000 mL | Freq: Once | INTRAVENOUS | Status: AC
  Administered 2013-08-19: 1000 mL via INTRAVENOUS

## 2013-08-19 MED ORDER — FENTANYL CITRATE 0.05 MG/ML IJ SOLN
25.0000 ug | Freq: Once | INTRAMUSCULAR | Status: AC
  Administered 2013-08-19: 25 ug via INTRAVENOUS

## 2013-08-19 MED ORDER — LORAZEPAM 1 MG PO TABS
1.0000 mg | ORAL_TABLET | Freq: Once | ORAL | Status: AC
  Administered 2013-08-19: 1 mg via ORAL
  Filled 2013-08-19: qty 1

## 2013-08-19 MED ORDER — HYDROMORPHONE HCL PF 1 MG/ML IJ SOLN
1.0000 mg | Freq: Once | INTRAMUSCULAR | Status: AC
  Administered 2013-08-19: 1 mg via INTRAVENOUS
  Filled 2013-08-19: qty 1

## 2013-08-19 NOTE — ED Notes (Signed)
Bed: WA20 Expected date:  Expected time:  Means of arrival:  Comments: EMS/42 yo female with panic attack and positive LOC

## 2013-08-19 NOTE — ED Notes (Addendum)
Per EMS, pt has had a real bad flare up with lupus for a few days, worse today. She had a panic attack and tachypnea, and LOC. Episode around 2200.  She was unconscious for a brief period of time. Her biggest complaint is pain from lupus.  CBG- 122

## 2013-08-19 NOTE — ED Provider Notes (Signed)
CSN: [id number]     Arrival date & time 08/19/13  2234 History   First MD Initiated Contact with Patient 08/19/13 2303     Chief Complaint  Patient presents with  . Panic Attack  . Loss of Consciousness   (Consider location/radiation/quality/duration/timing/severity/associated sxs/prior Treatment) Patient is a 42 y.o. female presenting with syncope. The history is provided by the patient.  Loss of Consciousness Episode history:  Single Most recent episode:  Today Timing: once. Progression:  Resolved Chronicity:  New Context comment:  Will active at church Witnessed: yes   Relieved by:  Nothing Worsened by:  Nothing tried Ineffective treatments:  None tried Associated symptoms: anxiety   Associated symptoms: no chest pain, no dizziness, no fever, no focal weakness, no headaches, no nausea, no shortness of breath and no vomiting     Past Medical History  Diagnosis Date  . Seizure disorder   . Lupus   . Lupus (systemic lupus erythematosus) 2007  . Lupus (systemic lupus erythematosus) 2007  . Endometriosis    Past Surgical History  Procedure Laterality Date  . Abdominal hysterectomy    . Cesarean section     No family history on file. History  Substance Use Topics  . Smoking status: Never Smoker   . Smokeless tobacco: Not on file  . Alcohol Use: No   OB History   Grav Para Term Preterm Abortions TAB SAB Ect Mult Living   3 3 3       3      Review of Systems  Constitutional: Negative for fever and fatigue.  HENT: Negative for congestion, drooling and neck pain.   Eyes: Negative for pain.  Respiratory: Negative for cough and shortness of breath.   Cardiovascular: Positive for syncope. Negative for chest pain.  Gastrointestinal: Negative for nausea, vomiting, abdominal pain and diarrhea.  Genitourinary: Negative for dysuria and hematuria.  Musculoskeletal: Negative for back pain and gait problem.  Skin: Negative for color change.  Neurological: Negative for  dizziness, focal weakness and headaches.  Hematological: Negative for adenopathy.  Psychiatric/Behavioral: Negative for behavioral problems.       Anxiety   All other systems reviewed and are negative.    Allergies  Diamox; Nsaids; Omnipaque; Compazine; Lactose intolerance (gi); and Reglan  Home Medications   Current Outpatient Rx  Name  Route  Sig  Dispense  Refill  . acetaminophen (TYLENOL) 500 MG tablet   Oral   Take 500-1,000 mg by mouth every 6 (six) hours as needed for pain.          . Cholecalciferol (VITAMIN D PO)   Oral   Take 1 tablet by mouth daily.         . clonazePAM (KLONOPIN) 0.5 MG tablet   Oral   Take 0.5 mg by mouth daily as needed for anxiety.          . Cyanocobalamin (VITAMIN B-12 PO)   Oral   Take 1 tablet by mouth daily.         Marland Kitchen docusate sodium (COLACE) 100 MG capsule   Oral   Take 100 mg by mouth daily as needed for constipation.         . ondansetron (ZOFRAN ODT) 8 MG disintegrating tablet   Oral   Take 1 tablet (8 mg total) by mouth every 8 (eight) hours as needed for nausea.   20 tablet   0   . polyethylene glycol (MIRALAX / GLYCOLAX) packet   Oral   Take 17 g  by mouth daily as needed (constipation).         . promethazine (PHENERGAN) 25 MG tablet   Oral   Take 1 tablet (25 mg total) by mouth every 6 (six) hours as needed.   30 tablet   0    BP 114/81  Pulse 108  Temp(Src) 98.3 F (36.8 C) (Oral)  Resp 26  SpO2 100% Physical Exam  Nursing note and vitals reviewed. Constitutional: She is oriented to person, place, and time. She appears well-developed and well-nourished.  Anxious.   HENT:  Head: Normocephalic.  Mouth/Throat: No oropharyngeal exudate.  Eyes: Conjunctivae and EOM are normal. Pupils are equal, round, and reactive to light.  Neck: Normal range of motion. Neck supple.  Cardiovascular: Normal rate, regular rhythm, normal heart sounds and intact distal pulses.  Exam reveals no gallop and no friction  rub.   No murmur heard. Pulmonary/Chest: Breath sounds normal. She is in respiratory distress (tachypneic). She has no wheezes.  Abdominal: Soft. Bowel sounds are normal. There is no tenderness. There is no rebound and no guarding.  Musculoskeletal: Normal range of motion. She exhibits no edema and no tenderness.  Neurological: She is alert and oriented to person, place, and time.  Skin: Skin is warm and dry.  Psychiatric: She has a normal mood and affect. Her behavior is normal.    ED Course  Procedures (including critical care time) Labs Review Labs Reviewed  CBC WITH DIFFERENTIAL - Abnormal; Notable for the following:    Hemoglobin 11.0 (*)    HCT 33.9 (*)    All other components within normal limits  COMPREHENSIVE METABOLIC PANEL - Abnormal; Notable for the following:    Glucose, Bld 108 (*)    All other components within normal limits  URINALYSIS, ROUTINE W REFLEX MICROSCOPIC - Abnormal; Notable for the following:    APPearance CLOUDY (*)    All other components within normal limits  POCT PREGNANCY, URINE   Imaging Review Dg Chest 2 View  08/19/2013   *RADIOLOGY REPORT*  Clinical Data: Panic attack, loss of consciousness, chest tightness, difficulty breathing, history lupus  CHEST - 2 VIEW  Comparison: 06/11/2013  Findings: Enlargement of cardiac silhouette. Mediastinal contours and pulmonary vascularity normal. Lungs clear. No pleural effusion or pneumothorax. Bones unremarkable.  IMPRESSION: No acute abnormalities. Enlargement of cardiac silhouette.   Original Report Authenticated By: Ulyses Southward, M.D.    MDM   1. Pain of right side of body   2. Anxiety   3. Syncope    11:22 PM 42 y.o. female  with history of lupus who presents with a syncopal episode which occurred while she was active at church proximally 1.5 hours ago. The patient notes that she has had worsening right sided pain for the last few days. She notes the pain is sharp and constant and consistent with her  previous lupus flares. The patient is afebrile here. She is anxious and the tachypneic on exam. Will workup for syncope, pain control for lupus pain, Ativan for anxiety. Suspect situational syncope.   1:54 AM: Pt feeling better w/ pain control. I interpreted/reviewed the labs and/or imaging which were non-contributory.   I have discussed the diagnosis/risks/treatment options with the patient and believe the pt to be eligible for discharge home to follow-up with pcp in 1-2 days. We also discussed returning to the ED immediately if new or worsening sx occur. We discussed the sx which are most concerning (e.g., worsening pain, further syncope) that necessitate immediate return. Any new  prescriptions provided to the patient are listed below.  New Prescriptions   No medications on file     Junius Argyle, MD 08/20/13 1949

## 2013-08-20 DIAGNOSIS — R55 Syncope and collapse: Secondary | ICD-10-CM | POA: Diagnosis present

## 2013-08-20 DIAGNOSIS — R52 Pain, unspecified: Secondary | ICD-10-CM | POA: Diagnosis present

## 2013-08-20 DIAGNOSIS — F419 Anxiety disorder, unspecified: Secondary | ICD-10-CM | POA: Diagnosis present

## 2013-08-20 LAB — COMPREHENSIVE METABOLIC PANEL
ALT: 15 U/L (ref 0–35)
Alkaline Phosphatase: 69 U/L (ref 39–117)
BUN: 14 mg/dL (ref 6–23)
CO2: 24 mEq/L (ref 19–32)
GFR calc Af Amer: >90 mL/min (ref >90)
GFR calc non Af Amer: >90 mL/min (ref >90)
Glucose, Bld: 108 mg/dL — ABNORMAL HIGH (ref 70–99)
Potassium: 3.6 mEq/L (ref 3.5–5.1)
Sodium: 138 mEq/L (ref 135–145)
Total Bilirubin: 0.3 mg/dL (ref 0.3–1.2)
Total Protein: 6.8 g/dL (ref 6.0–8.3)

## 2013-08-20 LAB — URINALYSIS, ROUTINE W REFLEX MICROSCOPIC
Glucose, UA: NEGATIVE mg/dL
Ketones, ur: NEGATIVE mg/dL
Leukocytes, UA: NEGATIVE
Nitrite: NEGATIVE
Specific Gravity, Urine: 1.029 (ref 1.005–1.030)
pH: 6 (ref 5.0–8.0)

## 2013-08-20 LAB — POCT PREGNANCY, URINE: Preg Test, Ur: NEGATIVE

## 2013-08-20 LAB — CBC WITH DIFFERENTIAL/PLATELET
Eosinophils Absolute: 0.1 10*3/uL (ref 0.0–0.7)
Hemoglobin: 11 g/dL — ABNORMAL LOW (ref 12.0–15.0)
Lymphocytes Relative: 20 % (ref 12–46)
Lymphs Abs: 1.5 10*3/uL (ref 0.7–4.0)
MCH: 27.8 pg (ref 26.0–34.0)
MCV: 85.6 fL (ref 78.0–100.0)
Monocytes Relative: 6 % (ref 3–12)
Neutrophils Relative %: 73 % (ref 43–77)
RBC: 3.96 MIL/uL (ref 3.87–5.11)
WBC: 7.7 10*3/uL (ref 4.0–10.5)

## 2013-08-20 MED ORDER — HYDROMORPHONE HCL PF 1 MG/ML IJ SOLN
1.0000 mg | Freq: Once | INTRAMUSCULAR | Status: AC
  Administered 2013-08-20: 1 mg via INTRAVENOUS
  Filled 2013-08-20: qty 1

## 2013-08-22 ENCOUNTER — Ambulatory Visit (INDEPENDENT_AMBULATORY_CARE_PROVIDER_SITE_OTHER): Payer: Medicaid Other | Admitting: General Surgery

## 2013-08-22 ENCOUNTER — Encounter (INDEPENDENT_AMBULATORY_CARE_PROVIDER_SITE_OTHER): Payer: Self-pay | Admitting: General Surgery

## 2013-08-22 ENCOUNTER — Other Ambulatory Visit (INDEPENDENT_AMBULATORY_CARE_PROVIDER_SITE_OTHER): Payer: Self-pay | Admitting: General Surgery

## 2013-08-22 ENCOUNTER — Telehealth: Payer: Self-pay

## 2013-08-22 VITALS — BP 118/72 | HR 88 | Temp 98.4°F | Resp 14 | Ht 63.0 in | Wt 163.2 lb

## 2013-08-22 DIAGNOSIS — R52 Pain, unspecified: Secondary | ICD-10-CM

## 2013-08-22 DIAGNOSIS — R109 Unspecified abdominal pain: Secondary | ICD-10-CM

## 2013-08-22 DIAGNOSIS — K921 Melena: Secondary | ICD-10-CM

## 2013-08-22 NOTE — Telephone Encounter (Signed)
Pt called and stated that she was referred to general surgery from out clinic and that they wanted her records from [facility] sent to them.  She wanted to know if we had them and if we could send them [facility].  Called pt and informed pt that we have not received those records yet and the best thing to do call [facility] and have them fax the notes directly to them.  Pt stated "ok , I will do that, thank you".

## 2013-08-22 NOTE — Progress Notes (Addendum)
Patient ID: Donna [name], female   DOB: 11/08/1971, 42 y.o.   MRN: [id number]  Chief Complaint  Patient presents with  . New Evaluation    eval for poss sx/abd issues    HPI Donna [name] is a 42 y.o. female.  Referred by Donna [name] HPI 60 yof with a complicated history and I do not have any of her records available to me.  By her report she has had abdominal pain since 2005.  This is limiting her life at this point.  She had a daughter by c/s in February of 2005 and underwent a hysterectomy a month later due to ? Infection and spent about 2 weeks in hospital postop.  She had severe pain primarily bilateral lower quadrants since then.  Difficulty keeping food down.  This continued and in 2008 she underwent repeat surgery at [facility] for what she says was endometriosis involving multiple organs.  This was an 8 hour surgery and they "repaired by bladder, stomach" and apparently did an SO but there is some discrepancy about what side.  She was better for about a year.  Then pain began again  She has been unable to participate in sexual activity due to pain.  She was told that it might be "in my mind".  She has tried antidepressants and multiple psychiatrists to no avail.  She was seen at [facility] for emergency at some point and referred back to [facility] to consider surgery.  She was seen there this year and told no for any surgery. She continues to have pain, blood in stool (for which she has not evaluated).  She has hydrosalpinx which has been presents since 2012 and sounds unchanged.  She has seen Donna Nicholaus Bloom who is considering surgery for pain and I have discussed this with her.  Past Medical History  Diagnosis Date  . Seizure disorder   . Lupus   . Lupus (systemic lupus erythematosus) 2007  . Lupus (systemic lupus erythematosus) 2007  . Endometriosis     Past Surgical History  Procedure Laterality Date  . Abdominal hysterectomy    . Cesarean section      Family History  Problem Relation Age of  Onset  . Cancer Sister     breast & ovarian  . Cancer Maternal Aunt   . Birth defects Maternal Uncle     breast and ovarian    Social History History  Substance Use Topics  . Smoking status: Never Smoker   . Smokeless tobacco: Never Used  . Alcohol Use: No    Allergies  Allergen Reactions  . Diamox [Acetazolamide] Anaphylaxis  . Nsaids Anaphylaxis  . Omnipaque [Iohexol] Hives and Itching    Pt given Benadryl after reaction (at [facility]); IV Benadryl given prior to CT 03/27/2013; pt tolerated procedure without problems.  . Compazine [Prochlorperazine] Hives  . Lactose Intolerance (Gi) Nausea And Vomiting  . Reglan [Metoclopramide] Hives    Current Outpatient Prescriptions  Medication Sig Dispense Refill  . acetaminophen (TYLENOL) 500 MG tablet Take 500-1,000 mg by mouth every 6 (six) hours as needed for pain.       . cholecalciferol (VITAMIN D) 1000 UNITS tablet Take 1,000 Units by mouth every morning.      . cyanocobalamin 500 MCG tablet Take 500 mcg by mouth every morning.      . docusate sodium (COLACE) 100 MG capsule Take 100 mg by mouth daily as needed for constipation.      . ondansetron (ZOFRAN ODT) 8  MG disintegrating tablet Take 1 tablet (8 mg total) by mouth every 8 (eight) hours as needed for nausea.  20 tablet  0  . polyethylene glycol (MIRALAX / GLYCOLAX) packet Take 17 g by mouth daily as needed (constipation).      . promethazine (PHENERGAN) 25 MG tablet Take 25 mg by mouth every 6 (six) hours as needed for nausea.      Marland Kitchen acetaminophen-codeine (TYLENOL #3) 300-30 MG per tablet Take 1 tablet by mouth every 4 (four) hours as needed for pain.       No current facility-administered medications for this visit.    Review of Systems Review of Systems  Constitutional: Negative for fever, chills and unexpected weight change.  HENT: Negative for hearing loss, congestion, sore throat, trouble swallowing and voice change.   Eyes: Negative for visual disturbance.    Respiratory: Negative for cough and wheezing.   Cardiovascular: Positive for leg swelling. Negative for chest pain and palpitations.  Gastrointestinal: Positive for nausea, vomiting, abdominal pain, constipation and blood in stool. Negative for diarrhea, abdominal distention and anal bleeding.  Genitourinary: Negative for hematuria, vaginal bleeding and difficulty urinating.  Musculoskeletal: Positive for arthralgias.  Skin: Negative for rash and wound.  Neurological: Positive for weakness. Negative for seizures, syncope and headaches.  Hematological: Negative for adenopathy. Does not bruise/bleed easily.  Psychiatric/Behavioral: Negative for confusion.    Blood pressure 118/72, pulse 88, temperature 98.4 F (36.9 C), temperature source Temporal, resp. rate 14, height 5\' 3"  (1.6 m), weight 163 lb 3.2 oz (74.027 kg).  Physical Exam Physical Exam  Vitals reviewed. Constitutional: She appears well-developed and well-nourished.  Eyes: No scleral icterus.  Cardiovascular: Normal rate and regular rhythm.   Abdominal: Soft. There is tenderness (greatest in bilateral lower quadrants) in the right upper quadrant, right lower quadrant, left upper quadrant and left lower quadrant. No hernia.    Lymphadenopathy:    She has no cervical adenopathy.    Data Reviewed Donna Norman Clay note  Assessment    Chronic abdominal pain    Plan    I have reviewed ct scan and dont find anything that would merit surgery from my standpoint.  I have told her there has to be clear goal in mind and chance at improvement for me to be willing to assist Donna [name].  I told her I would not operate on her or put her at risk operating for pain.  I am concerned about blood in stool however and will send her to gi to consider colonoscopy.  She will follow up with Donna [name] once her records are available.  I told her I would review those also but I don't think surgery will be helpful.  I also think if surgery were ever an option  that returning to [facility] would be the best plan.   She would also need to re-establish care for her lupus prior to consideration of any surgery also.    Atziry Baranski 08/22/2013, 9:02 PM

## 2013-08-24 ENCOUNTER — Telehealth: Payer: Self-pay | Admitting: *Deleted

## 2013-08-24 ENCOUNTER — Telehealth (INDEPENDENT_AMBULATORY_CARE_PROVIDER_SITE_OTHER): Payer: Self-pay | Admitting: General Surgery

## 2013-08-24 ENCOUNTER — Encounter: Payer: Self-pay | Admitting: Internal Medicine

## 2013-08-24 NOTE — Telephone Encounter (Signed)
Spoke with pt and informed her that we have scheduled her with Dr. Rhea Belton with  GI located at [address] on 09/26/13 at 10:15.

## 2013-08-24 NOTE — Telephone Encounter (Signed)
Pt left message stating that she had talked with a nurse about having her records faxed to the general surgeon. She was told that we did not have the records from [facility] yet. She then called [facility] and was told that they had faxed the records on 08/11/13 @ 1136am. She is following up on the situation. I checked in our files of received fax information and did not find these records, nor have they been scanned to media section of EMR. Pt needs to be contacted in order to obtain the telephone number that she called @ [facility] so that we can call and request the records again. ROI has been scanned to media section of her chart.

## 2013-08-29 NOTE — Telephone Encounter (Signed)
Called patient, no answer- left message that we are trying to get in touch with you and need to ask you a couple questions, please call us back at the clinics

## 2013-08-30 NOTE — Telephone Encounter (Signed)
Called pt and confirmed with patient that she did get [facility] records to [facility].  Pt stated yes and I informed pt that we would contact [facility] for the records so that we have in our records so that way she don't have to worry trying to them to [place].  Pt stated understanding and did not have any other questions.

## 2013-09-07 ENCOUNTER — Inpatient Hospital Stay (HOSPITAL_COMMUNITY)
Admission: AD | Admit: 2013-09-07 | Discharge: 2013-09-08 | Disposition: A | Discharge status: Home / Self Care | Payer: Medicaid Other | Source: Ambulatory Visit | Attending: Obstetrics & Gynecology | Admitting: Obstetrics & Gynecology

## 2013-09-07 ENCOUNTER — Encounter (HOSPITAL_COMMUNITY): Payer: Self-pay | Admitting: *Deleted

## 2013-09-07 DIAGNOSIS — R102 Pelvic and perineal pain unspecified side: Secondary | ICD-10-CM

## 2013-09-07 DIAGNOSIS — R109 Unspecified abdominal pain: Secondary | ICD-10-CM | POA: Insufficient documentation

## 2013-09-07 DIAGNOSIS — G8929 Other chronic pain: Secondary | ICD-10-CM

## 2013-09-07 DIAGNOSIS — R112 Nausea with vomiting, unspecified: Secondary | ICD-10-CM | POA: Insufficient documentation

## 2013-09-07 HISTORY — DX: Unspecified convulsions: R56.9

## 2013-09-07 MED ORDER — PROMETHAZINE HCL 25 MG/ML IJ SOLN: 25.0000 mg | Freq: Once | INTRAMUSCULAR | Status: DC

## 2013-09-07 MED ORDER — HYDROMORPHONE HCL PF 1 MG/ML IJ SOLN
1.0000 mg | Freq: Once | INTRAMUSCULAR | Status: AC
  Administered 2013-09-07: 1 mg via INTRAMUSCULAR
  Filled 2013-09-07: qty 1

## 2013-09-07 MED ORDER — PROMETHAZINE HCL 25 MG/ML IJ SOLN
25.0000 mg | Freq: Once | INTRAMUSCULAR | Status: AC
  Administered 2013-09-07: 25 mg via INTRAMUSCULAR
  Filled 2013-09-07: qty 1

## 2013-09-07 NOTE — MAU Note (Signed)
Pt visibly upset and crying. States she feels like the provider was rude to her and her sister. States she called the office for follow up for pain and possible vaginal infection and was told by the office to come to MAU. States that she feels like was "written off" by the FNP & that the FNP didn't "even check for the infection because she was mad at me". Pt agrees with plan for discharge and follow up with Dr. Marice Potter; but states still upset that she was "disrespected". Informed pt would have administrative coordinator come speak with her.

## 2013-09-07 NOTE — MAU Provider Note (Signed)
History    CSN: [id number]  Arrival date and time: 09/07/13 2009  First Provider Initiated Contact with Patient 09/07/13 2201     No chief complaint on file.  HPI Comments: Donna [name] 42 y.o. [id number] presents to MAU for abdominal pain and nausea. She has chronic pain since 2005. She has had 5 ER visits for pain management. She has been evaluated  by Dr Marice Potter and by [facility]. She is currently awaiting colonoscopy and GI evaluation. She states that her new complaint is vaginal bleeding. She has had a TAH.     Past Medical History  Diagnosis Date  . Seizure disorder   . Lupus   . Lupus (systemic lupus erythematosus) 2007  . Lupus (systemic lupus erythematosus) 2007  . Endometriosis   . Seizures     last sz 06/2013, no meds    Past Surgical History  Procedure Laterality Date  . Abdominal hysterectomy    . Cesarean section    Left salpingoophorectomy  Family History  Problem Relation Age of Onset  . Cancer Sister     breast & ovarian  . Cancer Maternal Aunt   . Birth defects Maternal Uncle     breast and ovarian    History  Substance Use Topics  . Smoking status: Never Smoker   . Smokeless tobacco: Never Used  . Alcohol Use: No    Allergies:  Allergies  Allergen Reactions  . Diamox [Acetazolamide] Anaphylaxis  . Nsaids Anaphylaxis  . Omnipaque [Iohexol] Hives and Itching    Pt given Benadryl after reaction (at [facility]); IV Benadryl given prior to CT 03/27/2013; pt tolerated procedure without problems.  . Compazine [Prochlorperazine] Hives  . Lactose Intolerance (Gi) Nausea And Vomiting  . Reglan [Metoclopramide] Hives    Prescriptions prior to admission  Medication Sig Dispense Refill  . acetaminophen (TYLENOL) 500 MG tablet Take 500-1,000 mg by mouth every 6 (six) hours as needed for pain.       . cholecalciferol (VITAMIN D) 1000 UNITS tablet Take 1,000 Units by mouth every morning.      . cyanocobalamin 500 MCG tablet Take 500 mcg by mouth every morning.       . docusate sodium (COLACE) 100 MG capsule Take 100 mg by mouth daily as needed for constipation.      . polyethylene glycol (MIRALAX / GLYCOLAX) packet Take 17 g by mouth daily as needed (constipation).      . promethazine (PHENERGAN) 25 MG tablet Take 25 mg by mouth every 6 (six) hours as needed for nausea.        Review of Systems  Constitutional: Negative.   HENT: Negative.   Gastrointestinal: Positive for nausea, vomiting and abdominal pain.  Genitourinary:       Vaginal bleeding  Skin: Negative.   Psychiatric/Behavioral: The patient is nervous/anxious.    Physical Exam   Blood pressure 125/87, pulse 90, temperature 98.1 F (36.7 C), temperature source Oral, resp. rate 18, height 5\' 2"  (1.575 m), weight 165 lb 8 oz (75.07 kg), last menstrual period 06/14/2003.  Physical Exam  Constitutional: She is oriented to person, place, and time. She appears well-developed and well-nourished. She appears distressed.  HENT:  Head: Normocephalic and atraumatic.  Cardiovascular: Normal rate, regular rhythm and normal heart sounds.   Respiratory: Effort normal and breath sounds normal.  GI: Soft. There is tenderness.  Genitourinary: Vagina normal.  External : large hemorrhoid/ otherwise negative Vagina thin white discharge. No bleeding/ no lesion   Musculoskeletal: Normal range  of motion.  Neurological: She is alert and oriented to person, place, and time.  Skin: Skin is warm and dry.  Psychiatric:  Very anxious and hyperventilating at times    MAU Course  Procedures  MDM Given Dilaudid and phenergan Dr Macon Large was called per patient request  Assessment and Plan    A: Chronic abdominal pain  P: Above meds given in MAU Pt advised to be seen in office for management of pain  Barefoot, Rubbie Battiest 09/07/2013, 10:01 PM   Attestation of Attending Supervision of Advanced Practitioner (NP): Evaluation and management procedures were performed by the Advanced Practitioner under my  supervision and collaboration.  I have reviewed the Advanced Practitioner's note and chart, and I agree with the management and plan.  I saw patient as per her request and explained that no further evaluation was needed at this point given chronic nature of abdominal and pelvic pain.  There are no acute symptoms; and no gynecologic symptoms.  Patient agreed to get a prescription for Percocet for pain and Phenergan for nausea. She will follow up with GI and General Surgery for further evaluation.  Jaynie Collins, MD, FACOG Attending Obstetrician & Gynecologist Faculty Practice, [facility]

## 2013-09-07 NOTE — MAU Note (Signed)
SAY  SHE  VOMITED  AT WORK YESTERDAY- WITH PAIN RIGHT  SIDE AND RADIATES TO HER BACK.  THEN   TO  URGENT CARE-   IN [place]   TEMP- 101.8,  LABS- WBC ELEVATED,  CT SCAN-  NOT APPENDIX-  MAYBE  RELATED TO ENDO AND WAS TOLD  TO  COME BACK TODAY IF NEEDED.     TODAY PAIN HAS BECOME WORSE-   TOOK  TYLENOL  1 TAB XS AT 6PM-   NO RELIEF.   HAS NAUSEA.    SEES DR DOVE IN GYN CLINIC- THERE LAST -   08-02-2013

## 2013-09-08 DIAGNOSIS — R109 Unspecified abdominal pain: Secondary | ICD-10-CM

## 2013-09-08 DIAGNOSIS — G8929 Other chronic pain: Secondary | ICD-10-CM | POA: Diagnosis present

## 2013-09-08 DIAGNOSIS — N949 Unspecified condition associated with female genital organs and menstrual cycle: Secondary | ICD-10-CM

## 2013-09-08 MED ORDER — OXYCODONE-ACETAMINOPHEN 5-325 MG PO TABS: 1.0000 | ORAL_TABLET | Freq: Four times a day (QID) | ORAL | Status: DC | PRN

## 2013-09-08 MED ORDER — PROMETHAZINE HCL 25 MG PO TABS: 25.0000 mg | ORAL_TABLET | Freq: Four times a day (QID) | ORAL | Status: DC | PRN

## 2013-09-18 ENCOUNTER — Ambulatory Visit: Payer: Medicaid Other | Admitting: Obstetrics & Gynecology

## 2013-09-20 ENCOUNTER — Encounter: Payer: Self-pay | Admitting: Internal Medicine

## 2013-09-20 ENCOUNTER — Ambulatory Visit: Payer: Medicaid Other | Admitting: Obstetrics & Gynecology

## 2013-09-26 ENCOUNTER — Other Ambulatory Visit (INDEPENDENT_AMBULATORY_CARE_PROVIDER_SITE_OTHER): Payer: Medicaid Other

## 2013-09-26 ENCOUNTER — Encounter: Payer: Self-pay | Admitting: Internal Medicine

## 2013-09-26 ENCOUNTER — Ambulatory Visit (INDEPENDENT_AMBULATORY_CARE_PROVIDER_SITE_OTHER): Payer: Medicaid Other | Admitting: Internal Medicine

## 2013-09-26 VITALS — BP 114/78 | HR 72 | Ht 62.0 in | Wt 163.4 lb

## 2013-09-26 DIAGNOSIS — K5909 Other constipation: Secondary | ICD-10-CM

## 2013-09-26 DIAGNOSIS — K625 Hemorrhage of anus and rectum: Secondary | ICD-10-CM

## 2013-09-26 DIAGNOSIS — R112 Nausea with vomiting, unspecified: Secondary | ICD-10-CM

## 2013-09-26 DIAGNOSIS — R1013 Epigastric pain: Secondary | ICD-10-CM

## 2013-09-26 DIAGNOSIS — K5900 Constipation, unspecified: Secondary | ICD-10-CM

## 2013-09-26 LAB — TSH: TSH: 0.35 u[IU]/mL (ref 0.35–5.50)

## 2013-09-26 MED ORDER — LUBIPROSTONE 24 MCG PO CAPS: 24.0000 ug | ORAL_CAPSULE | Freq: Two times a day (BID) | ORAL | Status: DC

## 2013-09-26 MED ORDER — MOVIPREP 100 G PO SOLR: ORAL | Status: DC

## 2013-09-26 NOTE — Progress Notes (Signed)
Patient ID: Donna [name], female   DOB: 11-23-1971, 41 y.o.   MRN: [id number] HPI: Donna [name] is a 42 yo female with PMH of chronic, severe constipation, lupus, endometriosis status post hysterectomy, history of pseudotumor cerebri and seizure disorder, and chronic abdominal pain who is seen in consultation at the request of Dr. Dwain Sarna for evaluation of constipation rectal bleeding. She is here alone today.  The patient reports a lifelong history of chronic and severe constipation. This dates back to as long as she can remember. She reports normal bowel movements for her occur approximately once per month. They're often hard and described as "stones". She reports associated lower abdominal pain and bloating. She has noted rectal bleeding which she feels is in part due to hemorrhoids, but she also states she has seen blood mixed with her stool of late which has worried her. She reports a history of external hemorrhoids which at times can be very painful. She also describes trouble with nausea and vomiting, this is worse after eating. She reports she gets epigastric abdominal pain after eating as well. She denies heartburn. No dysphagia or odynophagia. She has nausea almost days a week and vomiting approximately 5 days per week. She has not lost weight and reports in fact she has gained weight. Over the last several days she has had one or 2 soft hard stools, which is uncommon for her given that she normally goes once per month. She recalls being hospitalized at age 6 for severe constipation and no bowel movement x6 weeks. She recalls receiving a very very painful enema and at that time reportedly had a colonoscopy. She reports previous GI evaluation at [facility] [place] about 8 years ago. Colonoscopy was scheduled but canceled after incomplete prep. She reports colonoscopy was canceled without being attempted. She also has recently seen Dr. Marice Potter and Dwain Sarna.  Dr. Marice Potter is reportedly considering surgery for  hydrosalpinx and Dr. Dwain Sarna was consulted to possibly be present for this surgery. She reportedly has a history of endometriosis, and some record that there may have been question of whether this involved the bowel.  Of note in the past she has tried laxatives which include lactulose, high-dose MiraLax daily, metoclopramide, Colace, Dulcolax.  She reports when she was on daily MiraLax for abdominal pain and vomiting was worse and therefore she is currently taking it every 3 days. She is using Colace on a daily basis currently.  Past Medical History  Diagnosis Date  . Seizure disorder   . Lupus   . Lupus (systemic lupus erythematosus) 2007  . Lupus (systemic lupus erythematosus) 2007  . Endometriosis   . Seizures     last sz 06/2013, no meds    Past Surgical History  Procedure Laterality Date  . Abdominal hysterectomy      x2 partial  . Cesarean section      x3    Current Outpatient Prescriptions  Medication Sig Dispense Refill  . acetaminophen (TYLENOL) 500 MG tablet Take 500-1,000 mg by mouth every 6 (six) hours as needed for pain.       . cholecalciferol (VITAMIN D) 1000 UNITS tablet Take 1,000 Units by mouth every morning.      . cyanocobalamin 500 MCG tablet Take 500 mcg by mouth every morning.      . docusate sodium (COLACE) 100 MG capsule Take 100 mg by mouth daily as needed for constipation.      . polyethylene glycol (MIRALAX / GLYCOLAX) packet Take 17 g by mouth daily  as needed (constipation).      . promethazine (PHENERGAN) 25 MG tablet Take 25 mg by mouth every 6 (six) hours as needed for nausea.      Marland Kitchen lubiprostone (AMITIZA) 24 MCG capsule Take 1 capsule (24 mcg total) by mouth 2 (two) times daily with a meal.  60 capsule  6  . MOVIPREP 100 G SOLR Use per prep instruction  1 kit  0   No current facility-administered medications for this visit.    Allergies  Allergen Reactions  . Diamox [Acetazolamide] Anaphylaxis  . Nsaids Anaphylaxis  . Omnipaque [Iohexol]  Hives and Itching    Pt given Benadryl after reaction (at [facility]); IV Benadryl given prior to CT 03/27/2013; pt tolerated procedure without problems.  . Compazine [Prochlorperazine] Hives  . Lactose Intolerance (Gi) Nausea And Vomiting  . Reglan [Metoclopramide] Hives    Family History  Problem Relation Age of Onset  . Cancer Sister     breast & ovarian  . Cancer Maternal Aunt     breast & ovarian  . Birth defects Maternal Uncle     History  Substance Use Topics  . Smoking status: Never Smoker   . Smokeless tobacco: Never Used  . Alcohol Use: No    ROS: As per history of present illness, otherwise negative  BP 114/78  Pulse 72  Ht 5\' 2"  (1.575 m)  Wt 163 lb 6.4 oz (74.118 kg)  BMI 29.88 kg/m2  LMP 06/14/2003 Constitutional: Well-developed and well-nourished. No distress. HEENT: Normocephalic and atraumatic. Oropharynx is clear and moist. No oropharyngeal exudate. Conjunctivae are normal.  No scleral icterus. Neck: Neck supple. Trachea midline. Cardiovascular: Normal rate, regular rhythm and intact distal pulses.  Pulmonary/chest: Effort normal and breath sounds normal. No wheezing, rales or rhonchi. Abdominal: Soft, mild lower abdominal tenderness without rebound or guarding, nondistended. Bowel sounds active throughout. Well healed abdominal scars Extremities: no clubbing, cyanosis, or edema Neurological: Alert and oriented to person place and time. Skin: Skin is warm and dry. No rashes noted. Psychiatric: Normal mood and affect. Behavior is normal.  RELEVANT LABS AND IMAGING: CBC    Component Value Date/Time   WBC 7.7 08/20/2013 0010   RBC 3.96 08/20/2013 0010   HGB 11.0* 08/20/2013 0010   HCT 33.9* 08/20/2013 0010   PLT 343 08/20/2013 0010   MCV 85.6 08/20/2013 0010   MCH 27.8 08/20/2013 0010   MCHC 32.4 08/20/2013 0010   RDW 13.8 08/20/2013 0010   LYMPHSABS 1.5 08/20/2013 0010   MONOABS 0.5 08/20/2013 0010   EOSABS 0.1 08/20/2013 0010   BASOSABS 0.0 08/20/2013 0010    CMP      Component Value Date/Time   NA 138 08/20/2013 0010   K 3.6 08/20/2013 0010   CL 106 08/20/2013 0010   CO2 24 08/20/2013 0010   GLUCOSE 108* 08/20/2013 0010   BUN 14 08/20/2013 0010   CREATININE 0.73 08/20/2013 0010   CALCIUM 8.8 08/20/2013 0010   PROT 6.8 08/20/2013 0010   ALBUMIN 3.9 08/20/2013 0010   AST 20 08/20/2013 0010   ALT 15 08/20/2013 0010   ALKPHOS 69 08/20/2013 0010   BILITOT 0.3 08/20/2013 0010   GFRNONAA >90 08/20/2013 0010   GFRAA >90 08/20/2013 0010    Multiple previous abdominal CT scans, the 2 of which below from June and July 2014 CT ABDOMEN AND PELVIS WITH CONTRAST   Technique:  Multidetector CT imaging of the abdomen and pelvis was performed following the standard protocol during bolus administration of intravenous  contrast.   Contrast: OMNIPAQUE IOHEXOL 300 MG/ML  SOLN   Comparison: 03/27/2013.   Findings: Lung Bases: Dependent atelectasis.  No definite fibrotic changes of the lungs.  Tiny area of subpleural scarring in the right middle lobe.  Patulous distal thoracic esophagus with fluid level.   Liver:  Normal.   Spleen:  Normal.   Gallbladder:  Normal.   Common bile duct:  Normal.   Pancreas:  Normal.   Adrenal glands:  Normal bilaterally.   Kidneys:  Normal enhancement.  Left interpolar renal cyst.  Early excretion of contrast due to the hand injection of intravenous contrast.  The ureters are within normal limits.   Stomach:  Normal.   Small bowel:  Normal.   Colon:   Proximal colon distended with oral contrast.  Contrast extends to the rectosigmoid.  No obstruction. Normal appendix.   Pelvic Genitourinary:  Right adnexal multicystic lesion is present. In the long axis, this measures 7 cm.  On axial imaging, the aggregate measurements are 9.4 cm AP by 3.2 cm transverse.  The patient appears to be status post hysterectomy.   Bones:  No aggressive osseous lesions.   Vasculature: Normal.   Body Wall: Scarring is present in the periumbilical region.    IMPRESSION: 1.  No acute abnormality.   2.  Unchanged apparent right hydrosalpinx. Hysterectomy. 3.  Normal appendix. 4.  Left interpolar renal cyst.     Original Report Authenticated By: Andreas [place], M.D. Clinical Data: Right lower quadrant pain.   ___________________________________________________________________________________________ CT ABDOMEN AND PELVIS WITH CONTRAST   Technique:  Multidetector CT imaging of the abdomen and pelvis was performed following the standard protocol during bolus administration of intravenous contrast.   Contrast: OMNIPAQUE IOHEXOL 300 MG/ML  SOLN   Comparison: CT abdomen and pelvis 06/11/2013 and 03/27/2013. Pelvic ultrasound 03/27/2013.   Findings: Atelectatic change is seen in the lung bases.  No pleural or pericardial effusion.   The gallbladder, liver, spleen, adrenal glands, pancreas and right kidney appear normal.  A small left renal cyst is unchanged.  A moderately large stool burden is present in the transverse colon through the rectum.  The appendix appears normal.  Stomach and small bowel appear normal.   There is a small volume of free pelvic fluid.  As seen on prior studies, a fluid filled tubular structure in the right hemi pelvis is unchanged and likely represents a hydrosalpinx.  The left ovary is unremarkable.  The uterus has been removed.  A small volume of free pelvic fluid is noted.   IMPRESSION:   1.  Normal-appearing appendix.  No acute finding. 2.  Tubular fluid filled structure in the right adnexa is not markedly changed and likely due to hydrosalpinx.   ASSESSMENT/PLAN: 42 yo female with PMH of chronic, severe constipation, lupus, endometriosis status post hysterectomy, history of pseudotumor cerebri and seizure disorder, and chronic abdominal pain who is seen in consultation at the request of Dr. Dwain Sarna for evaluation of constipation and rectal bleeding  1.  Severe constipation/rectal  bleeding/abd pain -- she does have a mild normocytic anemia.  Cross-sectional imaging reviewed which shows stool throughout the colon but no evidence of colitis or other abnormality. She does have a tubular filled structure in the right adnexa which is likely hydrosalpinx.  Given that this has been lifelong it raises the question of a colonic inertia, but given her rectal bleeding, I have recommended colonoscopy. We will attempt a 2 to three-day prep in order to get adequate  visualization. I also will start her on lubiprostone 24 mcg twice daily. We discussed side effects include nausea and diarrhea. She is asked to call me if this occurs.  She voices understanding. We discussed colonoscopy today including the risks and benefits and she is agreeable to proceed. I would also like to check a TSH. She will continue MiraLax and Colace at her current dose for now.  2.  Epigastric pain, nausea, vomiting -- given her upper GI symptoms I have recommended upper endoscopy to be performed at the time of her colonoscopy. After discussion of the risks and benefits she is agreeable to proceed. This will help exclude ulcer disease, H. pylori infection, gastroduodenitis.

## 2013-09-26 NOTE — Patient Instructions (Addendum)
You have been scheduled for an endoscopy/Colonoscopy with propofol. Please follow written instructions given to you at your visit today. If you use inhalers (even only as needed), please bring them with you on the day of your procedure. Your physician has requested that you go to www.startemmi.com and enter the access code given to you at your visit today. This web site gives a general overview about your procedure. However, you should still follow specific instructions given to you by our office regarding your preparation for the procedure.  Continue taking Colace and Miralax as you were  We have sent the following medications to your pharmacy for you to pick up at your convenience: Moviprep, Amitiza. Take all medications as directed  Your physician has requested that you go to the basement for the following lab work before leaving today: TSH  You were given a 2 day prep for your procedure. Please call us 24 hours prior to your procedure to let us know how your prep is going.                                               We are excited to introduce MyChart, a new best-in-class service that provides you online access to important information in your electronic medical record. We want to make it easier for you to view your health information - all in one secure location - when and where you need it. We expect MyChart will enhance the quality of care and service we provide.  When you register for MyChart, you can:    View your test results.    Request appointments and receive appointment reminders via email.    Request medication renewals.    View your medical history, allergies, medications and immunizations.    Communicate with your physician's office through a password-protected site.    Conveniently print information such as your medication lists.  To find out if MyChart is right for you, please talk to a member of our clinical staff today. We will gladly answer your questions about  this free health and wellness tool.  If you are age 44 or older and want a member of your family to have access to your record, you must provide written consent by completing a proxy form available at our office. Please speak to our clinical staff about guidelines regarding accounts for patients younger than age 39.  As you activate your MyChart account and need any technical assistance, please call the MyChart technical support line at [phone] [phone]) or email your question to [email or web address]. If you email your question(s), please include your name, a return phone number and the best time to reach you.  If you have non-urgent health-related questions, you can send a message to our office through MyChart at [email or web address]. If you have a medical emergency, call 911.  Thank you for using MyChart as your new health and wellness resource!   MyChart licensed from [employer],  [id number]. Patents Pending.

## 2013-10-03 ENCOUNTER — Telehealth: Payer: Self-pay | Admitting: Gastroenterology

## 2013-10-03 NOTE — Telephone Encounter (Signed)
Pt called to say she started her 2 day prepping for her colonoscopy which is scheduled for 10/04/2013. She has yet to have a bowel movement. Per Dr. Rhea Belton, she is to start her colon prep (moviprep) now and take it again at 4, she is instructed to call me before end of business today to let me know if she has had a bowel movement yet, and Dr. Rhea Belton will advise depending on results

## 2013-10-04 ENCOUNTER — Observation Stay (HOSPITAL_COMMUNITY)
Admission: EM | Admit: 2013-10-04 | Discharge: 2013-10-05 | Disposition: A | Discharge status: Home / Self Care | Payer: Medicaid Other | Attending: Gastroenterology | Admitting: Gastroenterology

## 2013-10-04 ENCOUNTER — Encounter: Payer: Self-pay | Admitting: Internal Medicine

## 2013-10-04 ENCOUNTER — Ambulatory Visit (AMBULATORY_SURGERY_CENTER): Payer: Medicaid Other | Admitting: Internal Medicine

## 2013-10-04 ENCOUNTER — Telehealth: Payer: Self-pay | Admitting: Gastroenterology

## 2013-10-04 ENCOUNTER — Emergency Department (HOSPITAL_COMMUNITY): Payer: Medicaid Other

## 2013-10-04 ENCOUNTER — Encounter (HOSPITAL_COMMUNITY): Payer: Self-pay | Admitting: Emergency Medicine

## 2013-10-04 VITALS — BP 142/79 | HR 98 | Temp 98.3°F | Resp 41 | Ht 62.0 in | Wt 163.0 lb

## 2013-10-04 DIAGNOSIS — Z79899 Other long term (current) drug therapy: Secondary | ICD-10-CM | POA: Insufficient documentation

## 2013-10-04 DIAGNOSIS — M329 Systemic lupus erythematosus, unspecified: Secondary | ICD-10-CM | POA: Insufficient documentation

## 2013-10-04 DIAGNOSIS — R1013 Epigastric pain: Secondary | ICD-10-CM

## 2013-10-04 DIAGNOSIS — R935 Abnormal findings on diagnostic imaging of other abdominal regions, including retroperitoneum: Secondary | ICD-10-CM | POA: Insufficient documentation

## 2013-10-04 DIAGNOSIS — J984 Other disorders of lung: Secondary | ICD-10-CM | POA: Insufficient documentation

## 2013-10-04 DIAGNOSIS — G8929 Other chronic pain: Secondary | ICD-10-CM | POA: Insufficient documentation

## 2013-10-04 DIAGNOSIS — K5909 Other constipation: Secondary | ICD-10-CM

## 2013-10-04 DIAGNOSIS — K2990 Gastroduodenitis, unspecified, without bleeding: Secondary | ICD-10-CM

## 2013-10-04 DIAGNOSIS — R109 Unspecified abdominal pain: Principal | ICD-10-CM | POA: Insufficient documentation

## 2013-10-04 DIAGNOSIS — K5900 Constipation, unspecified: Secondary | ICD-10-CM

## 2013-10-04 DIAGNOSIS — R12 Heartburn: Secondary | ICD-10-CM

## 2013-10-04 DIAGNOSIS — K2970 Gastritis, unspecified, without bleeding: Secondary | ICD-10-CM

## 2013-10-04 DIAGNOSIS — K625 Hemorrhage of anus and rectum: Secondary | ICD-10-CM

## 2013-10-04 DIAGNOSIS — R52 Pain, unspecified: Secondary | ICD-10-CM | POA: Insufficient documentation

## 2013-10-04 DIAGNOSIS — Z90710 Acquired absence of both cervix and uterus: Secondary | ICD-10-CM | POA: Insufficient documentation

## 2013-10-04 DIAGNOSIS — G40909 Epilepsy, unspecified, not intractable, without status epilepticus: Secondary | ICD-10-CM | POA: Insufficient documentation

## 2013-10-04 HISTORY — DX: Acute embolism and thrombosis of unspecified deep veins of unspecified lower extremity: I82.409

## 2013-10-04 LAB — PROTIME-INR: Prothrombin Time: 14.2 seconds (ref 11.6–15.2)

## 2013-10-04 LAB — BASIC METABOLIC PANEL
BUN: 14 mg/dL (ref 6–23)
Chloride: 106 mEq/L (ref 96–112)
GFR calc Af Amer: >90 mL/min (ref >90)
GFR calc non Af Amer: >90 mL/min (ref >90)
Potassium: 3.9 mEq/L (ref 3.5–5.1)
Sodium: 137 mEq/L (ref 135–145)

## 2013-10-04 MED ORDER — ONDANSETRON HCL 4 MG PO TABS: 4.0000 mg | ORAL_TABLET | Freq: Four times a day (QID) | ORAL | Status: DC | PRN

## 2013-10-04 MED ORDER — HYDROMORPHONE HCL PF 1 MG/ML IJ SOLN
0.5000 mg | INTRAMUSCULAR | Status: DC | PRN
  Administered 2013-10-04 – 2013-10-05 (×4): 0.5 mg via INTRAVENOUS
  Filled 2013-10-04 (×4): qty 1

## 2013-10-04 MED ORDER — HYDROMORPHONE HCL PF 1 MG/ML IJ SOLN
1.0000 mg | Freq: Once | INTRAMUSCULAR | Status: AC
  Administered 2013-10-04: 1 mg via INTRAVENOUS
  Filled 2013-10-04: qty 1

## 2013-10-04 MED ORDER — SODIUM CHLORIDE 0.9 % IV SOLN: 500.0000 mL | INTRAVENOUS | Status: DC

## 2013-10-04 MED ORDER — ONDANSETRON HCL 4 MG/2ML IJ SOLN
4.0000 mg | Freq: Four times a day (QID) | INTRAMUSCULAR | Status: DC | PRN
  Administered 2013-10-04 (×2): 4 mg via INTRAVENOUS
  Filled 2013-10-04 (×2): qty 2

## 2013-10-04 MED ORDER — DEXTROSE-NACL 5-0.45 % IV SOLN
INTRAVENOUS | Status: DC
  Administered 2013-10-04 – 2013-10-05 (×2): via INTRAVENOUS

## 2013-10-04 NOTE — ED Provider Notes (Signed)
EKG Interpretation   None        Celene Kras, MD 10/04/13 2344

## 2013-10-04 NOTE — H&P (Signed)
Patient seen, examined, and I agree with the above documentation, including the assessment and plan. No evidence of colonic perforation after CT abd/pelvis.  In fact the colon is not overly distended at this point. D/c rectal tube. Pt reports she gets these attacks of pain at home when she needs to have a BM and cannot. Stable to slightly more prominent hydrosalpinx. Will control pain tonight, remove rectal tube, encourage ambulation.  Okay for clears, advance as tolerated We need to get her on a laxative regimen that helps, while we wait on anorectal mano and balloon expulsion testing (need to rule out rectal/sphincter dysfunction as at present defecation seems more prominent than slow transit) She did not tolerated lubiprostone 24 mcg BID (had no BM and caused vomiting). Will plan d/c home with trial of Linzess 290 mcg daily.  PRN gylcerin suppositories to help stimulate rectum.  Miralax can also be continued. Anticipate d/c tomorrow    CT ABDOMEN AND PELVIS WITHOUT CONTRAST   TECHNIQUE: Multidetector CT imaging of the abdomen and pelvis was performed following the standard protocol without intravenous contrast.   COMPARISON:  CT 06/28/2013   FINDINGS: Stable pleural-based 4 mm nodule in the right middle lobe. Few patchy densities at the left lung base are suggestive for atelectasis. There is no evidence for pneumoperitoneum.   Unenhanced CT was performed per clinician order. Lack of IV contrast limits sensitivity and specificity, especially for evaluation of abdominal/pelvic solid viscera.   No gross abnormality to the liver, gallbladder, spleen, pancreas, adrenals or kidneys. Again noted is a low-density structure in the left renal cortex which probably represents a cyst. There soft tissue changes in the lower anterior abdominal wall that probably represent previous surgery. Uterus has been removed. Again noted is a low-density tubular structure in the right hemipelvis which  is suggestive for a hydrosalpinx. There may be a trace amount of fluid in the pelvis. Patient has a rectal tube in place. No acute bone abnormality.   IMPRESSION: No evidence for free air.   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 This has minimally changed since the previous examination. There is a trace amount of fluid in the pelvis.   Stable 4 mm pleural-based nodular density in the right middle lobe. If the patient is at high risk for bronchogenic carcinoma, follow-up chest CT at 1year is recommended. If the patient is at low risk, no follow-up is needed. This recommendation follows the consensus statement: Guidelines for Management of Small Pulmonary Nodules Detected on CT Scans: A Statement from the Fleischner Society as published in Radiology 2005; 237:395-400.

## 2013-10-04 NOTE — ED Provider Notes (Signed)
CSN: [id number]     Arrival date & time 10/04/13  1601 History   First MD Initiated Contact with Patient 10/04/13 1603     Chief Complaint  Patient presents with  . Abdominal Pain   (Consider location/radiation/quality/duration/timing/severity/associated sxs/prior Treatment) HPI Comments: Patient presents to the ED s/p EGD and colonoscopy today. She reports moderate-severe pain in the chest and upper abdomen.  Patient has been unable to pass much air following the procedures.  Patient has received fentanyl with no relief.  Patient has a rectal tube placed, which has been passing some air.  GI PA is bedside and asks for CT scan and pain meds.  GI PA tells me that she will work on admission orders.  The history is provided by the patient. No language interpreter was used.    Past Medical History  Diagnosis Date  . Seizure disorder   . Lupus   . Lupus (systemic lupus erythematosus) 2007  . Lupus (systemic lupus erythematosus) 2007  . Endometriosis   . Seizures     last sz 06/2013, no meds   Past Surgical History  Procedure Laterality Date  . Abdominal hysterectomy      x2 partial  . Cesarean section      x3   Family History  Problem Relation Age of Onset  . Cancer Sister     breast & ovarian  . Cancer Maternal Aunt     breast & ovarian  . Birth defects Maternal Uncle    History  Substance Use Topics  . Smoking status: Never Smoker   . Smokeless tobacco: Never Used  . Alcohol Use: No   OB History   Grav Para Term Preterm Abortions TAB SAB Ect Mult Living   3 3 2 1      3      Review of Systems  All other systems reviewed and are negative.    Allergies  Diamox; Nsaids; Omnipaque; Compazine; Lactose intolerance (gi); and Reglan  Home Medications   Current Outpatient Rx  Name  Route  Sig  Dispense  Refill  . acetaminophen (TYLENOL) 500 MG tablet   Oral   Take 500-1,000 mg by mouth every 6 (six) hours as needed for pain.          . cholecalciferol (VITAMIN D)  1000 UNITS tablet   Oral   Take 1,000 Units by mouth every morning.         . cyanocobalamin 500 MCG tablet   Oral   Take 500 mcg by mouth every morning.         . docusate sodium (COLACE) 100 MG capsule   Oral   Take 100 mg by mouth daily as needed for constipation.         Marland Kitchen lubiprostone (AMITIZA) 24 MCG capsule   Oral   Take 1 capsule (24 mcg total) by mouth 2 (two) times daily with a meal.   60 capsule   6   . polyethylene glycol (MIRALAX / GLYCOLAX) packet   Oral   Take 17 g by mouth daily as needed (constipation).         . promethazine (PHENERGAN) 25 MG tablet   Oral   Take 25 mg by mouth every 6 (six) hours as needed for nausea.          BP 110/45  Pulse 95  Temp(Src) 98.4 F (36.9 C) (Oral)  Resp 22  SpO2 99%  LMP 06/14/2003 Physical Exam  Nursing note and vitals reviewed. Constitutional: She  is oriented to person, place, and time. She appears well-developed and well-nourished.  Patient is crying  HENT:  Head: Normocephalic and atraumatic.  Eyes: Conjunctivae and EOM are normal. Pupils are equal, round, and reactive to light.  Neck: Normal range of motion. Neck supple.  Cardiovascular: Normal rate and regular rhythm.  Exam reveals no gallop and no friction rub.   No murmur heard. Pulmonary/Chest: Effort normal and breath sounds normal. No respiratory distress. She has no wheezes. She has no rales. She exhibits no tenderness.  Abdominal: Soft. Bowel sounds are normal. She exhibits no distension and no mass. There is tenderness. There is no rebound and no guarding.  Hyperactive bowel sounds, diffuse abdominal tenderness  Musculoskeletal: Normal range of motion. She exhibits no edema and no tenderness.  Neurological: She is alert and oriented to person, place, and time.  Skin: Skin is warm and dry.  Psychiatric: She has a normal mood and affect. Her behavior is normal. Judgment and thought content normal.    ED Course  Procedures (including  critical care time) Results for orders placed during the hospital encounter of 10/04/13  BASIC METABOLIC PANEL      Result Value Range   Sodium 137  135 - 145 mEq/L   Potassium 3.9  3.5 - 5.1 mEq/L   Chloride 106  96 - 112 mEq/L   CO2 21  19 - 32 mEq/L   Glucose, Bld 91  70 - 99 mg/dL   BUN 14  6 - 23 mg/dL   Creatinine, Ser 1.02  0.50 - 1.10 mg/dL   Calcium 8.9  8.4 - 72.5 mg/dL   GFR calc non Af Amer >90  >90 mL/min   GFR calc Af Amer >90  >90 mL/min   Ct Abdomen Pelvis Wo Contrast  10/04/2013   CLINICAL DATA:  Recent colonoscopy. Generalized pain. Rule out pneumoperitoneum.  EXAM: CT ABDOMEN AND PELVIS WITHOUT CONTRAST  TECHNIQUE: Multidetector CT imaging of the abdomen and pelvis was performed following the standard protocol without intravenous contrast.  COMPARISON:  CT 06/28/2013  FINDINGS: Stable pleural-based 4 mm nodule in the right middle lobe. Few patchy densities at the left lung base are suggestive for atelectasis. There is no evidence for pneumoperitoneum.  Unenhanced CT was performed per clinician order. Lack of IV contrast limits sensitivity and specificity, especially for evaluation of abdominal/pelvic solid viscera.  No gross abnormality to the liver, gallbladder, spleen, pancreas, adrenals or kidneys. Again noted is a low-density structure in the left renal cortex which probably represents a cyst. There soft tissue changes in the lower anterior abdominal wall that probably represent previous surgery. Uterus has been removed. Again noted is a low-density tubular structure in the right hemipelvis which is suggestive for a hydrosalpinx. There may be a trace amount of fluid in the pelvis. Patient has a rectal tube in place. No acute bone abnormality.  IMPRESSION: No evidence for free air.  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 This has minimally changed since the previous examination. There is a trace amount of  fluid in the pelvis.  Stable 4 mm pleural-based nodular density in the right middle lobe. If the patient is at high risk for bronchogenic carcinoma, follow-up chest CT at 1year is recommended. If the patient is at low risk, no follow-up is needed. This recommendation follows the consensus statement: Guidelines for Management of Small Pulmonary Nodules Detected on CT Scans: A Statement from the Fleischner Society as published  in Radiology 2005; [id number].   Electronically Signed   By: Richarda Overlie M.D.   On: 10/04/2013 17:26      EKG Interpretation   None       MDM  1: abdominal pain  Patient with abdominal pain following colonoscopy and EGD.  Will order CT to rule out perforation and free air.  Will get basic labs.  Patient has been seen by GI PA, who tells me that the patient will likely be admitted regardless.     Roxy Horseman, PA-C 10/04/13 1816

## 2013-10-04 NOTE — Op Note (Signed)
[facility] [address] [place], [address]   COLONOSCOPY PROCEDURE REPORT  PATIENT: Dawnn, Nam  MR#: [id number] BIRTHDATE: 05-29-1971 , 42  yrs. old GENDER: Female ENDOSCOPIST: Beverley Fiedler, MD PROCEDURE DATE:  10/04/2013 PROCEDURE:   Colonoscopy, diagnostic First Screening Colonoscopy - Avg.  risk and is 50 yrs.  old or older - No.  Prior Negative Screening - Now for repeat screening. N/A  History of Adenoma - Now for follow-up colonoscopy & has been > or = to 3 yrs.  N/A  Polyps Removed Today? No.  Recommend repeat exam, <10 yrs? No. ASA CLASS:   Class II INDICATIONS:Rectal Bleeding, Abdominal pain, and Constipation. MEDICATIONS: MAC sedation, administered by CRNA and propofol (Diprivan) 150mg  IV  DESCRIPTION OF PROCEDURE:   After the risks benefits and alternatives of the procedure were thoroughly explained, informed consent was obtained.  A digital rectal exam revealed no rectal mass.   The LB [id number]-[id number]  endoscope was introduced through the anus and advanced to the cecum, which was identified by both the appendix and ileocecal valve. No adverse events experienced. The quality of the prep was poor requiring copious irrigation and lavage, using today prepped of MiraLax, Dulcolax, MoviPrep, and magnesium citrate.  The instrument was then slowly withdrawn as the colon was fully examined.    COLON FINDINGS: A significant amount of stool was present throughout the entire examined colon.  Copious irrigation and lavage was performed but not able to clear a significant amount of stool. This precluded complete visualization of the colonic mucosa, however the scope was advanced to the cecum with relative disease. There was no areas of significant inflammation or luminal narrowing seen. cath there is no evidence of obstruction.     Retroflexed views revealed small internal and external hemorrhoids. The time to cecum=5 minutes 36 seconds.   Withdrawal time=12 minutes 00 seconds.  The scope was withdrawn and the procedure completed.  COMPLICATIONS: There were no complications during the procedure.  ENDOSCOPIC IMPRESSION: 1.   Significant amount of stool was present throughout the entire examined colon; Inadequate for the detection of small colon polyps 2.   No evidence of obstruction or inflammation.  RECOMMENDATIONS: Further testing for significant constipation, starting with rectal manometry and balloon expulsion test.   eSigned:  Beverley Fiedler, MD 10/04/2013 3:37 PM  cc: The Patient, Emelia Loron, MD, and Nicholaus Bloom, MD

## 2013-10-04 NOTE — Progress Notes (Signed)
Lidocaine-40mg IV prior to Propofol InductionPropofol given over incremental dosages 

## 2013-10-04 NOTE — Telephone Encounter (Signed)
Called pt to check and see if she has had a bowel movement. Pt said she took mag citrate at 8pm last night and her bowels started moving at 1 am and she has gone at least 4 times.pt will be in this afternoon for a colonoscopy.

## 2013-10-04 NOTE — ED Notes (Signed)
Pt to get CT before being transferred to floor

## 2013-10-04 NOTE — H&P (Signed)
Primary Care Physician:  No PCP Per Patient Primary Gastroenterologist:    Dr . Erick Blinks  CHIEF COMPLAINT: Acute severe abdominal pain  HPI: Donna [name] is a 42 y.o. female recently establish with Dr. Rhea Belton who has past history of chronic severe constipation, lupus, and endometriosis for which she is status post hysterectomy. She also has history of pseudotumor cerebral I. and a seizure disorder. She has been having problems with chronic abdominal pain and had been seen earlier this summer by Dr. Dwain Sarna for surgery with complaints of constipation and rectal bleeding. Patient has stated that recent office visit on 09/26/2013 that she had had lifelong history of chronic severe constipation dating back as long as she could remember. She states that she normally has a bowel movement about once per month and says she passes very hard stool. She has had intermittent problems with rectal bleeding which she has attributed to hemorrhoids. She also complained of postprandial abdominal pain more in the epigastric area with no complaints of heartburn ,dysphagia, or odynophagia. She also has frequent episodes of nausea and nausea with vomiting. She did not had any weight loss. Patient had had prior GI evaluation at [facility] proximally 8 years previous and was to have a colonoscopy but this was canceled due to incomplete prep. She has recently had a GYN consultation was seen by Dr. Marice Potter who is considering surgery for a hydrosalpinx. When  initially seen in the office mid-October she was taking MiraLax on a daily basis as well as  Colace ,Dulcolax -and was started on a trial of Lubiprostone 24 mcg twice daily. TSH was checked and was normal and she was scheduled for colonoscopy. Patient underwent colonoscopy today in the [facility] , as well as upper endoscopy.  EGD shows a mild esophagitis and a 2 cm hiatal hernia gastric and duodenal mucosa appeared normal. Colonoscopy was pertinent for a very poor prep  with a significant amount of retained stool throughout the entire colon inadequate for detection of small colon polyps. There was no evidence of obstruction or inflammation. The cecum was visualized and she had copiuos irrigation  of stool. Also noted to have internal and external hemorrhoids. Post procedure patient began complaining of severe abdominal pain which persisted. Her abdomen was noted to be distended and she had rectal tube placed with good results however continued to complain of severe pain and was then transported to the emergency room via EMS for stat CT scan and further workup to rule out colonic perforation. She is admitted at this time for overnight observation-CT scan is pending.     Past Medical History  Diagnosis Date  . Seizure disorder   . Lupus   . Lupus (systemic lupus erythematosus) 2007  . Lupus (systemic lupus erythematosus) 2007  . Endometriosis   . Seizures     last sz 06/2013, no meds    Past Surgical History  Procedure Laterality Date  . Abdominal hysterectomy      x2 partial  . Cesarean section      x3    Prior to Admission medications   Medication Sig Start Date End Date Taking? Authorizing Provider  acetaminophen (TYLENOL) 500 MG tablet Take 500-1,000 mg by mouth every 6 (six) hours as needed for pain.    Yes Historical Provider, MD  cholecalciferol (VITAMIN D) 1000 UNITS tablet Take 1,000 Units by mouth every morning.   Yes Historical Provider, MD  cyanocobalamin 500 MCG tablet Take 500 mcg by mouth every morning.  Yes Historical Provider, MD  docusate sodium (COLACE) 100 MG capsule Take 100 mg by mouth daily as needed for constipation.   Yes Historical Provider, MD  lubiprostone (AMITIZA) 24 MCG capsule Take 1 capsule (24 mcg total) by mouth 2 (two) times daily with a meal. 09/26/13  Yes Beverley Fiedler, MD  polyethylene glycol (MIRALAX / GLYCOLAX) packet Take 17 g by mouth daily as needed (constipation).   Yes Historical Provider, MD  promethazine  (PHENERGAN) 25 MG tablet Take 25 mg by mouth every 6 (six) hours as needed for nausea.   Yes Historical Provider, MD    Current Facility-Administered Medications  Medication Dose Route Frequency Provider Last Rate Last Dose  . dextrose 5 %-0.45 % sodium chloride infusion   Intravenous Continuous Tavone Caesar S Marili Vader, PA-C      . HYDROmorphone (DILAUDID) injection 0.5 mg  0.5 mg Intravenous Q3H PRN Ezekiel Menzer S Hurschel Paynter, PA-C      . HYDROmorphone (DILAUDID) injection 1 mg  1 mg Intravenous Once Roxy Horseman, PA-C      . ondansetron [facility]) tablet 4 mg  4 mg Oral Q6H PRN Bentlie Catanzaro S Susumu Hackler, PA-C       Or  . ondansetron (ZOFRAN) injection 4 mg  4 mg Intravenous Q6H PRN Kassidy Dockendorf Oswald Hillock, PA-C       Current Outpatient Prescriptions  Medication Sig Dispense Refill  . acetaminophen (TYLENOL) 500 MG tablet Take 500-1,000 mg by mouth every 6 (six) hours as needed for pain.       . cholecalciferol (VITAMIN D) 1000 UNITS tablet Take 1,000 Units by mouth every morning.      . cyanocobalamin 500 MCG tablet Take 500 mcg by mouth every morning.      . docusate sodium (COLACE) 100 MG capsule Take 100 mg by mouth daily as needed for constipation.      Marland Kitchen lubiprostone (AMITIZA) 24 MCG capsule Take 1 capsule (24 mcg total) by mouth 2 (two) times daily with a meal.  60 capsule  6  . polyethylene glycol (MIRALAX / GLYCOLAX) packet Take 17 g by mouth daily as needed (constipation).      . promethazine (PHENERGAN) 25 MG tablet Take 25 mg by mouth every 6 (six) hours as needed for nausea.       Facility-Administered Medications Ordered in Other Encounters  Medication Dose Route Frequency Provider Last Rate Last Dose  . 0.9 %  sodium chloride infusion  500 mL Intravenous Continuous Beverley Fiedler, MD        Allergies as of 10/04/2013 - Review Complete 10/04/2013  Allergen Reaction Noted  . Diamox [acetazolamide] Anaphylaxis 07/21/2013  . Nsaids Anaphylaxis 03/26/2013  . Omnipaque [iohexol] Hives and Itching 12/16/2011  .  Compazine [prochlorperazine] Hives 03/26/2013  . Lactose intolerance (gi) Nausea And Vomiting 06/11/2013  . Reglan [metoclopramide] Hives 03/26/2013    Family History  Problem Relation Age of Onset  . Cancer Sister     breast & ovarian  . Cancer Maternal Aunt     breast & ovarian  . Birth defects Maternal Uncle     History   Social History  . Marital Status: Divorced    Spouse Name: N/A    Number of Children: 3  . Years of Education: N/A   Occupational History  . Accountant    Social History Main Topics  . Smoking status: Never Smoker   . Smokeless tobacco: Never Used  . Alcohol Use: No  . Drug Use: No  . Sexual Activity: Not  Currently    Birth Control/ Protection: Surgical   Other Topics Concern  . Not on file   Social History Narrative  . No narrative on file    Review of Systems:  All systems reviewed an negative except where noted in HPI.  Gen: Denies any fever, chills, sweats, anorexia, fatigue, weakness, malaise, weight loss, and sleep disorder CV: Denies chest pain, angina, palpitations, syncope, orthopnea, PND, peripheral edema, and claudication. Resp: Denies dyspnea at rest, dyspnea with exercise, cough, sputum, wheezing, coughing up blood, and pleurisy. GI: Denies vomiting blood, jaundice, and fecal incontinence.   Denies dysphagia or odynophagia. GU : Denies urinary burning, blood in urine, urinary frequency, urinary hesitancy, nocturnal urination, and urinary incontinence. MS: Denies joint pain, limitation of movement, and swelling, stiffness, low back pain, extremity pain. Denies muscle weakness, cramps, atrophy.  Derm: Denies rash, itching, dry skin, hives, moles, warts, or unhealing ulcers.  Psych: Denies depression, anxiety, memory loss, suicidal ideation, hallucinations, paranoia, and confusion. Heme: Denies bruising, bleeding, and enlarged lymph nodes. Neuro:  Denies any headaches, dizziness, paresthesias. Endo:  Denies any problems with DM,  thyroid, adrenal function.  Physical Exam: Vital signs in last 24 hours: Temp:  [98.3 F (36.8 C)-98.4 F (36.9 C)] 98.4 F (36.9 C) (10/22 1603) Pulse Rate:  [94-129] 95 (10/22 1603) Resp:  [0-75] 22 (10/22 1603) BP: (38-142)/(21-97) 110/45 mmHg (10/22 1603) SpO2:  [94 %-100 %] 99 % (10/22 1603) Weight:  [163 lb (73.936 kg)] 163 lb (73.936 kg) (10/22 1352)   General:  Pleasant, well-developed, AA female ,crying ,in pain, rocking on bed Head:  Normocephalic and atraumatic. Eyes:  Sclera clear, no icterus.   Conjunctiva pink. Ears:  Normal auditory acuity. Mouth:  No deformity or lesions.  Neck:  Supple; no masses . Lungs:  Clear throughout to auscultation.   No wheezes, crackles, or rhonchi. No acute distress. Heart:  Regular rate and rhythm; no murmurs. Abdomen:  Distended, BS+ active , diffusely tender, no palp mass or HSM Rectal:  Not done, just had colonoscopy  Today-rectal tube in place  Msk:  Symmetrical without gross deformities.. Pulses:  Normal pulses noted. Extremities:  Without edema. Neurologic:  Alert and  oriented x4;  grossly normal neurologically. Skin:  Intact without significant lesions or rashes. Cervical Nodes:  No significant cervical adenopathy. Psych:  Alert and cooperative. crying.  Lab Results: No results found for this basename: WBC, HGB, HCT, PLT,  in the last 72 hours BMET No results found for this basename: NA, K, CL, CO2, GLUCOSE, BUN, CREATININE, CALCIUM,  in the last 72 hours LFT No results found for this basename: PROT, ALBUMIN, AST, ALT, ALKPHOS, BILITOT, BILIDIR, IBILI,  in the last 72 hours PT/INR No results found for this basename: LABPROT, INR,  in the last 72 hours Hepatitis Panel No results found for this basename: HEPBSAG, HCVAB, HEPAIGM, HEPBIGM,  in the last 72 hours  Studies/Results: No results found.  Impression / Plan:  #48  42 year old female with chronic severe constipation, and component of chronic abdominal pain with  acute severe abdominal pain post colonoscopy done earlier this afternoon. Rule out colonic perforation versus retained air/ Ileus #2 history of endometriosis status post hysterectomy  #3 Lupus #4 right  hydrosalpinx #5  history of pseudotumor cerebri and seizure disorder-on no meds  Plan ; Patient will be admitted to observation for pain control, stat CT scan of the abdomen and pelvis. She'll be kept n.p.o. for the time being. We'll start IV fluids. Further plans pending  results of CT. Rectal tube to be left in place .         LOS: 0 days   Krislyn Donnan  10/04/2013, 4:27 PM

## 2013-10-04 NOTE — Progress Notes (Addendum)
1525 pm--Pt transferred to [facility] ED by [facility] per Dr Lauro Franklin orders. Pt still c/o abdominal pain 10/10, abdomen rigid and firm, sinus tachycardia at 100, last pressure 142/79. Pt alert and anxious. Husband took pt's belongings when he left [facility].  Copy of transfer sheet to [facility], one to chart, one to EMS.   Pt did received 75 mcg fentanyl with no pain relief, still 10/10 with this medicine. ewm

## 2013-10-04 NOTE — ED Notes (Signed)
Per EMS: Pt had endoscopy colonoscopy procedure today, now c/o generalized abd pain going up into chest. Doctor wanted to r/o perforation. Pt given Fentanyl, no relief. No blood in stool. Pt has rectal tube, nurse reported that air has been coming out from tube. A&O x 4.

## 2013-10-04 NOTE — Telephone Encounter (Signed)
Pt called back at 4 pm yesterday and said she still has not had a bowel movement. Per Dr. Rhea Belton Pt was advised to purchase 2 bottles of Magnesium citrate. Drink one at 7pm and call before 8:30 this morning to let us know if she has bad a bowel movement yet.

## 2013-10-04 NOTE — Op Note (Signed)
 [facility] [address] [place], [address]   ENDOSCOPY PROCEDURE REPORT  PATIENT: Donna [name], Donna [name]  MR#: [id number] BIRTHDATE: 21-Aug-1971 , 42  yrs. old GENDER: Female ENDOSCOPIST: Beverley Fiedler, MD PROCEDURE DATE:  10/04/2013 PROCEDURE:  EGD w/ biopsy ASA CLASS:     Class III INDICATIONS:  Epigastric pain. MEDICATIONS: MAC sedation, administered by CRNA and propofol (Diprivan) 250mg  IV TOPICAL ANESTHETIC: Cetacaine Spray  DESCRIPTION OF PROCEDURE: After the risks benefits and alternatives of the procedure were thoroughly explained, informed consent was obtained.  The LB [id number] endoscope was introduced through the mouth and advanced to the second portion of the duodenum. Without limitations.  The instrument was slowly withdrawn as the mucosa was fully examined.   ESOPHAGUS: Mild, LA Grade A, reflux esophagitis was found at the gastroesophageal junction.  The esophagus was otherwise normal.  STOMACH: A 2 cm hiatal hernia was noted.   The mucosa of the stomach appeared normal.  Biopsies were taken in the antrum and angularis.   DUODENUM: The duodenal mucosa showed no abnormalities in the bulb and second portion of the duodenum. Retroflexed views revealed a hiatal hernia.     The scope was then withdrawn from the patient and the procedure completed.  COMPLICATIONS: There were no complications.  ENDOSCOPIC IMPRESSION: 1.   Mild esophagitis consistent with reflux esophagitis at the gastroesophageal junction 2.   The esophagus was otherwise normal. 3.   2 cm hiatal hernia 4.   The mucosa of the stomach appeared normal; biopsies were taken in the antrum and angularis 5.   The duodenal mucosa showed no abnormalities in the bulb and second portion of the duodenum  RECOMMENDATIONS: 1.  Await pathology results 2.  Colonoscopy today 3.  Omeprazole 20 mg daily  eSigned:  Beverley Fiedler, MD 10/04/2013 3:27 PM [id number] Dwain Sarna, MD and Nicholaus Bloom, MD

## 2013-10-04 NOTE — Progress Notes (Signed)
Called to room to assist during endoscopic procedure.  Patient ID and intended procedure confirmed with present staff. Received instructions for my participation in the procedure from the performing physician.  

## 2013-10-05 ENCOUNTER — Telehealth: Payer: Self-pay | Admitting: *Deleted

## 2013-10-05 DIAGNOSIS — K5900 Constipation, unspecified: Secondary | ICD-10-CM

## 2013-10-05 DIAGNOSIS — R109 Unspecified abdominal pain: Secondary | ICD-10-CM

## 2013-10-05 DIAGNOSIS — K625 Hemorrhage of anus and rectum: Secondary | ICD-10-CM

## 2013-10-05 LAB — BASIC METABOLIC PANEL
BUN: 9 mg/dL (ref 6–23)
CO2: 25 mEq/L (ref 19–32)
Calcium: 9 mg/dL (ref 8.4–10.5)
Creatinine, Ser: 0.7 mg/dL (ref 0.50–1.10)
GFR calc non Af Amer: >90 mL/min (ref >90)
Glucose, Bld: 125 mg/dL — ABNORMAL HIGH (ref 70–99)
Potassium: 3.8 mEq/L (ref 3.5–5.1)

## 2013-10-05 MED ORDER — TRAMADOL HCL 50 MG PO TABS: 50.0000 mg | ORAL_TABLET | Freq: Four times a day (QID) | ORAL | Status: DC | PRN

## 2013-10-05 MED ORDER — HYOSCYAMINE SULFATE 0.125 MG SL SUBL
0.2500 mg | SUBLINGUAL_TABLET | SUBLINGUAL | Status: DC | PRN
  Filled 2013-10-05: qty 2

## 2013-10-05 MED ORDER — HYOSCYAMINE SULFATE 0.125 MG SL SUBL
0.2500 mg | SUBLINGUAL_TABLET | Freq: Once | SUBLINGUAL | Status: DC
  Filled 2013-10-05: qty 2

## 2013-10-05 MED ORDER — HYOSCYAMINE SULFATE 0.125 MG SL SUBL: 0.1250 mg | SUBLINGUAL_TABLET | SUBLINGUAL | Status: DC | PRN

## 2013-10-05 MED ORDER — LINACLOTIDE 290 MCG PO CAPS: 290.0000 ug | ORAL_CAPSULE | Freq: Every day | ORAL | Status: DC

## 2013-10-05 MED ORDER — PANTOPRAZOLE SODIUM 40 MG PO TBEC: 40.0000 mg | DELAYED_RELEASE_TABLET | Freq: Every day | ORAL | Status: DC

## 2013-10-05 NOTE — Care Management Note (Signed)
    Page 1 of 1   10/05/2013     11:46:43 AM   CARE MANAGEMENT NOTE 10/05/2013  Patient:  Donna [name], Donna [name]   Account Number:  [id number]  Date Initiated:  10/05/2013  Documentation initiated by:  Lorenda Ishihara  Subjective/Objective Assessment:   42 yo admitted s/p colonoscopy with abd pain.     Action/Plan:   Home when stable   Anticipated DC Date:  10/05/2013   Anticipated DC Plan:  HOME/SELF CARE      DC Planning Services  CM consult      Choice offered to / List presented to:             Status of service:  Completed, signed off Medicare Important Message given?   (If response is "NO", the following Medicare IM given date fields will be blank) Date Medicare IM given:   Date Additional Medicare IM given:    Discharge Disposition:  HOME/SELF CARE  Per UR Regulation:  Reviewed for med. necessity/level of care/duration of stay  If discussed at Long Length of Stay Meetings, dates discussed:    Comments:

## 2013-10-05 NOTE — Telephone Encounter (Signed)
Message copied by Florene Glen on Thu Oct 05, 2013  8:11 AM ------      Message from: Beverley Fiedler      Created: Wed Oct 04, 2013  6:04 PM       Pls refer to Dr. Maisie Fus at [facility] for anorectal manometry and balloon expulsion testing. ------

## 2013-10-05 NOTE — Telephone Encounter (Signed)
Informed pt of appt with Dr Maisie Fus on 10/19/13 at 3:05, be there at 2:40pm. Sonja called back to report pt has seen Dr Dwain Sarna and he does the procedures as well. [facility] will contact the pt if appt is changed.

## 2013-10-05 NOTE — Discharge Summary (Signed)
[facility] Discharge Summary  Name: Donna [name] MRN: [id number] DOB: Nov 05, 1971 42 y.o. PCP:  No PCP Per Patient  Date of Admission: 10/04/2013  4:01 PM Date of Discharge: 10/05/2013 Primary Gastroenterologist: Erick Blinks, MD Discharging Physician: Erick Blinks, MD  Discharge Diagnosis: 1. Post-procedure abdominal pain 2. Severe constipation  Consultations: none   Procedures Performed:  Ct Abdomen Pelvis Wo Contrast  10/04/2013   CLINICAL DATA:  Recent colonoscopy. Generalized pain. Rule out pneumoperitoneum.  EXAM: CT ABDOMEN AND PELVIS WITHOUT CONTRAST  TECHNIQUE: Multidetector CT imaging of the abdomen and pelvis was performed following the standard protocol without intravenous contrast.  COMPARISON:  CT 06/28/2013  FINDINGS: Stable pleural-based 4 mm nodule in the right middle lobe. Few patchy densities at the left lung base are suggestive for atelectasis. There is no evidence for pneumoperitoneum.  Unenhanced CT was performed per clinician order. Lack of IV contrast limits sensitivity and specificity, especially for evaluation of abdominal/pelvic solid viscera.  No gross abnormality to the liver, gallbladder, spleen, pancreas, adrenals or kidneys. Again noted is a low-density structure in the left renal cortex which probably represents a cyst. There soft tissue changes in the lower anterior abdominal wall that probably represent previous surgery. Uterus has been removed. Again noted is a low-density tubular structure in the right hemipelvis which is suggestive for a hydrosalpinx. There may be a trace amount of fluid in the pelvis. Patient has a rectal tube in place. No acute bone abnormality.  IMPRESSION: No evidence for free air.  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 This has minimally changed since the previous examination. There is a trace amount of fluid in the pelvis.  Stable 4 mm pleural-based  nodular density in the right middle lobe. If the patient is at high risk for bronchogenic carcinoma, follow-up chest CT at 1year is recommended. If the patient is at low risk, no follow-up is needed. This recommendation follows the consensus statement: Guidelines for Management of Small Pulmonary Nodules Detected on CT Scans: A Statement from the Fleischner Society as published in Radiology 2005; 237:395-400.   Electronically Signed   By: Richarda Overlie M.D.   On: 10/04/2013 17:26   GI Procedures: none  History/Physical Exam:  See Admission H&P  Admission HPI: Donna [name] is a 42 y.o. female recently establish with Dr. Rhea Belton who has past history of chronic severe constipation, lupus, and endometriosis for which she is status post hysterectomy. She also has history of pseudotumor cerebral I. and a seizure disorder. She has been having problems with chronic abdominal pain and had been seen earlier this summer by Dr. Dwain Sarna for surgery with complaints of constipation and rectal bleeding. Patient has stated that recent office visit on 09/26/2013 that she had had lifelong history of chronic severe constipation dating back as long as she could remember. She states that she normally has a bowel movement about once per month and says she passes very hard stool. She has had intermittent problems with rectal bleeding which she has attributed to hemorrhoids.  She also complained of postprandial abdominal pain more in the epigastric area with no complaints of heartburn ,dysphagia, or odynophagia. She also has frequent episodes of nausea and nausea with vomiting. She did not had any weight loss.  Patient had had prior GI evaluation at [facility] proximally 8 years previous and was to have a colonoscopy but this was canceled due to incomplete prep. She has recently had a GYN  consultation was seen by Dr. Marice Potter who is considering surgery for a hydrosalpinx.  When initially seen in the office mid-October she was  taking MiraLax on a daily basis as well as Colace ,Dulcolax -and was started on a trial of Lubiprostone 24 mcg twice daily. TSH was checked and was normal and she was scheduled for colonoscopy.  Patient underwent colonoscopy today in the [facility] , as well as upper endoscopy. EGD shows a mild esophagitis and a 2 cm hiatal hernia gastric and duodenal mucosa appeared normal.  Colonoscopy was pertinent for a very poor prep with a significant amount of retained stool throughout the entire colon inadequate for detection of small colon polyps. There was no evidence of obstruction or inflammation. The cecum was visualized and she had copiuos irrigation of stool. Also noted to have internal and external hemorrhoids.  Post procedure patient began complaining of severe abdominal pain which persisted. Her abdomen was noted to be distended and she had rectal tube placed with good results however continued to complain of severe pain and was then transported to the emergency room via EMS for stat CT scan and further workup to rule out colonic perforation.  She is admitted at this time for overnight observation-CT scan is pending.  Hospital Course by problem list: 1. Post-procedure abdominal pain. Patient admitted to observation for severe pain following EGD and colonoscopy. Upon admission a CTscan of the abdomen was obtained. No evidence for bowel perforation or other acute abnormalities. Her overnight stay was uneventful. Patient felt better the following morning and was discharged home in stable condition on a bowel anti-spasmodic and Linzess for constipation.   2. Constipation. Patient started on Linzess upon discharge. For further evaluation of severe constipation patient would be scheduled to see Dr.Thomas, colorectal surgeon, for consideration of rectal manometry.  Discharge Vitals:  BP 108/67  Pulse 79  Temp(Src) 97.7 F (36.5 C) (Oral)  Resp 20  Ht 5\' 2"  (1.575 m)  Wt 163 lb (73.936 kg)  BMI 29.81 kg/m2   SpO2 99%  LMP 06/14/2003  Discharge Labs:  Results for orders placed during the hospital encounter of 10/04/13 (from the past 24 hour(s))  BASIC METABOLIC PANEL     Status: None   Collection Time    10/04/13  4:25 PM      Result Value Range   Sodium 137  135 - 145 mEq/L   Potassium 3.9  3.5 - 5.1 mEq/L   Chloride 106  96 - 112 mEq/L   CO2 21  19 - 32 mEq/L   Glucose, Bld 91  70 - 99 mg/dL   BUN 14  6 - 23 mg/dL   Creatinine, Ser 1.61  0.50 - 1.10 mg/dL   Calcium 8.9  8.4 - 09.6 mg/dL   GFR calc non Af Amer >90  >90 mL/min   GFR calc Af Amer >90  >90 mL/min  PROTIME-INR     Status: None   Collection Time    10/04/13  6:07 PM      Result Value Range   Prothrombin Time 14.2  11.6 - 15.2 seconds   INR 1.12  0.00 - 1.49  BASIC METABOLIC PANEL     Status: Abnormal   Collection Time    10/05/13  4:09 AM      Result Value Range   Sodium 133 (*) 135 - 145 mEq/L   Potassium 3.8  3.5 - 5.1 mEq/L   Chloride 99  96 - 112 mEq/L   CO2 25  19 - 32 mEq/L   Glucose, Bld 125 (*) 70 - 99 mg/dL   BUN 9  6 - 23 mg/dL   Creatinine, Ser 1.61  0.50 - 1.10 mg/dL   Calcium 9.0  8.4 - 09.6 mg/dL   GFR calc non Af Amer >90  >90 mL/min   GFR calc Af Amer >90  >90 mL/min    Disposition and follow-up:   Ms.Lasharon M Colomb was discharged from [facility] in stable condition.    Follow-up Appointments: Discharge Orders   Future Appointments Provider Department Dept Phone   11/01/2013 3:15 PM Willodean Rosenthal, MD [facility] [phone]   Future Orders Complete By Expires   Resume previous diet  As directed      Discharge Medications:   Medication List    STOP taking these medications       lubiprostone 24 MCG capsule  Commonly known as:  AMITIZA      TAKE these medications       acetaminophen 500 MG tablet  Commonly known as:  TYLENOL  Take 500-1,000 mg by mouth every 6 (six) hours as needed for pain.     cholecalciferol 1000 UNITS tablet  Commonly  known as:  VITAMIN D  Take 1,000 Units by mouth every morning.     cyanocobalamin 500 MCG tablet  Take 500 mcg by mouth every morning.     docusate sodium 100 MG capsule  Commonly known as:  COLACE  Take 100 mg by mouth daily as needed for constipation.     hyoscyamine 0.125 MG SL tablet  Commonly known as:  LEVSIN SL  Place 1 tablet (0.125 mg total) under the tongue every 4 (four) hours as needed for cramping (abd pain).     Linaclotide 290 MCG Caps capsule  Commonly known as:  LINZESS  Take 1 capsule (290 mcg total) by mouth daily.     polyethylene glycol packet  Commonly known as:  MIRALAX / GLYCOLAX  Take 17 g by mouth daily as needed (constipation).     promethazine 25 MG tablet  Commonly known as:  PHENERGAN  Take 25 mg by mouth every 6 (six) hours as needed for nausea.     traMADol 50 MG tablet  Commonly known as:  ULTRAM  Take 1 tablet (50 mg total) by mouth every 6 (six) hours as needed.        Signed: Willette Cluster 10/05/2013, 10:16 AM

## 2013-10-05 NOTE — Telephone Encounter (Signed)
Per Dr. Rhea Belton he saw the pt this am at [facility].  No need for a call back call to this pt. Maw

## 2013-10-10 ENCOUNTER — Telehealth: Payer: Self-pay | Admitting: *Deleted

## 2013-10-10 NOTE — Telephone Encounter (Signed)
Spoke to [place] at [facility], today 10-10-2013. Called for a prior authorization on Linzess 290 mcg. Got approval for one year, from 10-10-2013 to 10-10-2014. Approval code: [id number]  Called [facility] [address] [place] to inform them of this approval. Called the patient also.

## 2013-10-11 ENCOUNTER — Encounter: Payer: Self-pay | Admitting: Internal Medicine

## 2013-10-16 ENCOUNTER — Encounter (INDEPENDENT_AMBULATORY_CARE_PROVIDER_SITE_OTHER): Payer: Medicaid Other | Admitting: General Surgery

## 2013-10-17 ENCOUNTER — Telehealth: Payer: Self-pay | Admitting: *Deleted

## 2013-10-17 NOTE — Telephone Encounter (Signed)
Clinical Letter Summary    Letter from: Beverley Fiedler   Reason for Letter: Results Review   Comments: 10/13/13 sent JLR   Aram Beecham  Please call to check on Mrs. Covin         Letters   lmom for pt to call back

## 2013-10-19 ENCOUNTER — Other Ambulatory Visit: Payer: Self-pay

## 2013-10-19 ENCOUNTER — Encounter (INDEPENDENT_AMBULATORY_CARE_PROVIDER_SITE_OTHER): Payer: Medicaid Other | Admitting: General Surgery

## 2013-10-23 NOTE — Telephone Encounter (Signed)
lmom for pt to call back

## 2013-10-24 ENCOUNTER — Encounter (INDEPENDENT_AMBULATORY_CARE_PROVIDER_SITE_OTHER): Payer: Medicaid Other | Admitting: General Surgery

## 2013-11-01 ENCOUNTER — Encounter: Payer: Self-pay | Admitting: Obstetrics & Gynecology

## 2013-11-01 ENCOUNTER — Ambulatory Visit (INDEPENDENT_AMBULATORY_CARE_PROVIDER_SITE_OTHER): Payer: Medicaid Other | Admitting: General Surgery

## 2013-11-01 ENCOUNTER — Encounter: Payer: Medicaid Other | Admitting: Obstetrics & Gynecology

## 2013-11-01 ENCOUNTER — Encounter (INDEPENDENT_AMBULATORY_CARE_PROVIDER_SITE_OTHER): Payer: Self-pay | Admitting: General Surgery

## 2013-11-01 VITALS — BP 112/70 | HR 92 | Resp 16 | Ht 63.0 in | Wt 163.8 lb

## 2013-11-01 DIAGNOSIS — K5900 Constipation, unspecified: Secondary | ICD-10-CM

## 2013-11-01 DIAGNOSIS — K5909 Other constipation: Secondary | ICD-10-CM

## 2013-11-01 NOTE — Progress Notes (Signed)
Chief Complaint  Patient presents with  . New Evaluation    rectal manometry & balloon expulsion    HISTORY: Donna [name] is a 42 y.o. female who presents to the office with rectal bleeding.  Other symptoms include pain and constipation.  This had been occurring for years.  She has a longstanding h/o constipation (1 BM/month). She has tried prep H in the past with no success.  BM's makes the symptoms worse.   It is intermittent in nature.  Her bowel habits are irregular and her bowel movements are hard and she has to either strain or sit on the toilet for a long time.  Her fiber intake is through diet and 2 doses of metamucil.  Her last colonoscopy was recently and normal except for a poor prep.   She has endometriosis and significant pelvic scarring.  She has one remaining ovary, and she is considering surgery to remove.  She has had 3 c sections but no vaginal deliveries.    Past Medical History  Diagnosis Date  . Seizure disorder   . Lupus   . Lupus (systemic lupus erythematosus) 2007  . Lupus (systemic lupus erythematosus) 2007  . Endometriosis   . Seizures     last sz 06/2013, no meds  . DVT (deep venous thrombosis)     right arm      Past Surgical History  Procedure Laterality Date  . Abdominal hysterectomy      x2 partial  . Cesarean section      x3        Current Outpatient Prescriptions  Medication Sig Dispense Refill  . acetaminophen (TYLENOL) 500 MG tablet Take 500-1,000 mg by mouth every 6 (six) hours as needed for pain.       . cholecalciferol (VITAMIN D) 1000 UNITS tablet Take 1,000 Units by mouth every morning.      . cyanocobalamin 500 MCG tablet Take 500 mcg by mouth every morning.      . docusate sodium (COLACE) 100 MG capsule Take 100 mg by mouth daily as needed for constipation.      . hyoscyamine (LEVSIN SL) 0.125 MG SL tablet Place 1 tablet (0.125 mg total) under the tongue every 4 (four) hours as needed for cramping (abd pain).  30 tablet  0  .  Linaclotide (LINZESS) 290 MCG CAPS capsule Take 1 capsule (290 mcg total) by mouth daily.  30 capsule  2  . polyethylene glycol (MIRALAX / GLYCOLAX) packet Take 17 g by mouth daily as needed (constipation).      . promethazine (PHENERGAN) 25 MG tablet Take 25 mg by mouth every 6 (six) hours as needed for nausea.      . traMADol (ULTRAM) 50 MG tablet Take 1 tablet (50 mg total) by mouth every 6 (six) hours as needed.  30 tablet  1   No current facility-administered medications for this visit.      Allergies  Allergen Reactions  . Diamox [Acetazolamide] Anaphylaxis  . Nsaids Anaphylaxis  . Omnipaque [Iohexol] Hives and Itching    Pt given Benadryl after reaction (at [facility]); IV Benadryl given prior to CT 03/27/2013; pt tolerated procedure without problems.  . Compazine [Prochlorperazine] Hives  . Lactose Intolerance (Gi) Nausea And Vomiting  . Reglan [Metoclopramide] Hives      Family History  Problem Relation Age of Onset  . Cancer Sister     breast & ovarian  . Cancer Maternal Aunt     breast & ovarian  .  Birth defects Maternal Uncle     History   Social History  . Marital Status: Divorced    Spouse Name: N/A    Number of Children: 3  . Years of Education: N/A   Occupational History  . Accountant    Social History Main Topics  . Smoking status: Never Smoker   . Smokeless tobacco: Never Used  . Alcohol Use: No  . Drug Use: No  . Sexual Activity: Not Currently    Birth Control/ Protection: Surgical   Other Topics Concern  . None   Social History Narrative  . None      REVIEW OF SYSTEMS - PERTINENT POSITIVES ONLY: Review of Systems - General ROS: negative for - chills, fever or weight loss Hematological and Lymphatic ROS: negative for - bleeding problems, blood clots or bruising Respiratory ROS: no cough, shortness of breath, or wheezing Cardiovascular ROS: no chest pain or dyspnea on exertion Gastrointestinal ROS: positive for - abdominal pain, blood in stools,  gas/bloating and nausea/vomiting negative for - change in stools or stool incontinence Genito-Urinary ROS: no dysuria, trouble voiding, or hematuria  EXAM: Filed Vitals:   11/01/13 1334  BP: 112/70  Pulse: 92  Resp: 16    General appearance: alert and cooperative Resp: clear to auscultation bilaterally Cardio: regular rate and rhythm GI: normal findings: soft, non-tender   Procedure: Anoscopy Surgeon: Donna Fus Diagnosis: rectal bleeding  Assistant: Donna [name] After the risks and benefits were explained, verbal consent was obtained for above procedure  Anesthesia: none Findings: grade 1 internal hemorrhoids, min external hemorrhoid disease, no rectocele, good rectal tone    ASSESSMENT AND PLAN: Donna [name] is a 42 y.o. F with chronic constipation.  She is s/p a negative colonoscopy.  On exam her anoscopy appears relatively benign.  I will set up anal manometry with balloon expulsion.  She may also need MR defacography if the manometry is abnormal.      Donna Panda, MD Colon and Rectal Surgery / General Surgery [facility]      Visit Diagnoses: No diagnosis found.  Primary Care Physician: No PCP Per Patient

## 2013-11-01 NOTE — Patient Instructions (Signed)

## 2013-11-02 NOTE — Progress Notes (Signed)
Patient ID: Donna [name], female   DOB: [birth year], 42 y.o.   MRN: [id number] Pt was scheduled with wrong provider

## 2013-11-08 ENCOUNTER — Telehealth (INDEPENDENT_AMBULATORY_CARE_PROVIDER_SITE_OTHER): Payer: Self-pay | Admitting: *Deleted

## 2013-11-08 NOTE — Telephone Encounter (Signed)
Patient called this morning asking that her anal rectal manometry be cancelled on Monday, 11/13/13.  Called [facility] and cancelled the procedure.  Explained that we would let Huntley Dec RN and Dr. Maisie Fus know about this to find out about rescheduling.

## 2013-11-08 NOTE — Telephone Encounter (Signed)
Pt called in and wanted to reschedule her endo procedure appt with Dr. Maisie Fus on Monday 12/1.  I called [facility] and cancelled this procedure per pts request and will send a message to Dr. Maisie Fus and Huntley Dec, RN to see about getting this procedure rescheduled with the pt.  Pt is made aware and is agreeable with this plan.

## 2013-11-13 ENCOUNTER — Encounter (HOSPITAL_COMMUNITY): Admission: RE | Payer: Self-pay | Source: Ambulatory Visit

## 2013-11-13 ENCOUNTER — Ambulatory Visit: Payer: Medicaid Other | Admitting: Obstetrics & Gynecology

## 2013-11-13 ENCOUNTER — Ambulatory Visit (HOSPITAL_COMMUNITY): Admission: RE | Admit: 2013-11-13 | Payer: Medicaid Other | Source: Ambulatory Visit | Admitting: General Surgery

## 2013-11-13 SURGERY — MANOMETRY, ANORECTAL

## 2013-11-13 NOTE — Telephone Encounter (Signed)
I called and left a message for the pt to call me.  I was off last week and unable to reschedule her as of yet.  I want to see if she still wants to go today and if they can even do it.  I do not know why it was cancelled.  Await call back.  She may be able to call back herself [phone] and ask for [place].  She may be able to get her on for today.

## 2013-11-14 NOTE — Telephone Encounter (Signed)
Patient called back and was given below message with telephone number to call and ask for [facility].  Patient states understanding and agreeable at this time.

## 2013-11-17 ENCOUNTER — Emergency Department (HOSPITAL_COMMUNITY)
Admission: EM | Admit: 2013-11-17 | Discharge: 2013-11-17 | Disposition: A | Discharge status: Home / Self Care | Payer: Medicaid Other | Attending: Emergency Medicine | Admitting: Emergency Medicine

## 2013-11-17 ENCOUNTER — Emergency Department (HOSPITAL_COMMUNITY): Payer: Medicaid Other

## 2013-11-17 ENCOUNTER — Encounter (HOSPITAL_COMMUNITY): Payer: Self-pay | Admitting: Emergency Medicine

## 2013-11-17 DIAGNOSIS — R51 Headache: Secondary | ICD-10-CM | POA: Insufficient documentation

## 2013-11-17 DIAGNOSIS — Z8739 Personal history of other diseases of the musculoskeletal system and connective tissue: Secondary | ICD-10-CM | POA: Insufficient documentation

## 2013-11-17 DIAGNOSIS — H5789 Other specified disorders of eye and adnexa: Secondary | ICD-10-CM | POA: Insufficient documentation

## 2013-11-17 DIAGNOSIS — H53149 Visual discomfort, unspecified: Secondary | ICD-10-CM | POA: Insufficient documentation

## 2013-11-17 DIAGNOSIS — R519 Headache, unspecified: Secondary | ICD-10-CM

## 2013-11-17 DIAGNOSIS — Z8742 Personal history of other diseases of the female genital tract: Secondary | ICD-10-CM | POA: Insufficient documentation

## 2013-11-17 DIAGNOSIS — R5381 Other malaise: Secondary | ICD-10-CM | POA: Insufficient documentation

## 2013-11-17 DIAGNOSIS — Z86718 Personal history of other venous thrombosis and embolism: Secondary | ICD-10-CM | POA: Insufficient documentation

## 2013-11-17 DIAGNOSIS — H5710 Ocular pain, unspecified eye: Secondary | ICD-10-CM | POA: Insufficient documentation

## 2013-11-17 DIAGNOSIS — Z8669 Personal history of other diseases of the nervous system and sense organs: Secondary | ICD-10-CM | POA: Insufficient documentation

## 2013-11-17 DIAGNOSIS — R110 Nausea: Secondary | ICD-10-CM | POA: Insufficient documentation

## 2013-11-17 DIAGNOSIS — Z8679 Personal history of other diseases of the circulatory system: Secondary | ICD-10-CM | POA: Insufficient documentation

## 2013-11-17 DIAGNOSIS — M542 Cervicalgia: Secondary | ICD-10-CM | POA: Insufficient documentation

## 2013-11-17 DIAGNOSIS — R42 Dizziness and giddiness: Secondary | ICD-10-CM | POA: Insufficient documentation

## 2013-11-17 DIAGNOSIS — Z79899 Other long term (current) drug therapy: Secondary | ICD-10-CM | POA: Insufficient documentation

## 2013-11-17 HISTORY — DX: Migraine, unspecified, not intractable, without status migrainosus: G43.909

## 2013-11-17 LAB — COMPREHENSIVE METABOLIC PANEL
ALT: 21 U/L (ref 0–35)
Alkaline Phosphatase: 101 U/L (ref 39–117)
BUN: 13 mg/dL (ref 6–23)
CO2: 22 mEq/L (ref 19–32)
Chloride: 102 mEq/L (ref 96–112)
Creatinine, Ser: 0.65 mg/dL (ref 0.50–1.10)
GFR calc Af Amer: >90 mL/min (ref >90)
GFR calc non Af Amer: >90 mL/min (ref >90)
Glucose, Bld: 88 mg/dL (ref 70–99)
Potassium: 3.8 mEq/L (ref 3.5–5.1)
Sodium: 139 mEq/L (ref 135–145)
Total Bilirubin: 0.4 mg/dL (ref 0.3–1.2)

## 2013-11-17 LAB — CBC
HCT: 38.7 % (ref 36.0–46.0)
Hemoglobin: 13 g/dL (ref 12.0–15.0)
RBC: 4.59 MIL/uL (ref 3.87–5.11)
RDW: 12.8 % (ref 11.5–15.5)
WBC: 6.9 10*3/uL (ref 4.0–10.5)

## 2013-11-17 MED ORDER — ONDANSETRON 4 MG PO TBDP
4.0000 mg | ORAL_TABLET | Freq: Once | ORAL | Status: AC
  Administered 2013-11-17: 4 mg via ORAL
  Filled 2013-11-17: qty 1

## 2013-11-17 MED ORDER — SODIUM CHLORIDE 0.9 % IV BOLUS (SEPSIS): 1000.0000 mL | INTRAVENOUS | Status: DC

## 2013-11-17 MED ORDER — ONDANSETRON HCL 4 MG/2ML IJ SOLN
4.0000 mg | Freq: Once | INTRAMUSCULAR | Status: DC
  Filled 2013-11-17: qty 2

## 2013-11-17 MED ORDER — MORPHINE SULFATE 2 MG/ML IJ SOLN
2.0000 mg | Freq: Once | INTRAMUSCULAR | Status: AC
  Administered 2013-11-17: 2 mg via INTRAMUSCULAR
  Filled 2013-11-17: qty 1

## 2013-11-17 MED ORDER — HYDROMORPHONE HCL PF 1 MG/ML IJ SOLN
1.0000 mg | Freq: Once | INTRAMUSCULAR | Status: DC
  Filled 2013-11-17: qty 1

## 2013-11-17 MED ORDER — TRAMADOL HCL 50 MG PO TABS: 50.0000 mg | ORAL_TABLET | Freq: Four times a day (QID) | ORAL | Status: DC | PRN

## 2013-11-17 MED ORDER — MORPHINE SULFATE 4 MG/ML IJ SOLN
4.0000 mg | Freq: Once | INTRAMUSCULAR | Status: AC
  Administered 2013-11-17: 4 mg via INTRAMUSCULAR

## 2013-11-17 MED ORDER — MORPHINE SULFATE 4 MG/ML IJ SOLN
4.0000 mg | Freq: Once | INTRAMUSCULAR | Status: DC
  Filled 2013-11-17: qty 1

## 2013-11-17 NOTE — ED Provider Notes (Signed)
CSN: [id number]     Arrival date & time 11/17/13  1335 History   First MD Initiated Contact with Patient 11/17/13 1651     Chief Complaint  Patient presents with  . Migraine   (Consider location/radiation/quality/duration/timing/severity/associated sxs/prior Treatment) HPI Pt is a 42yo female with hx of lupus, seizures, and migraines presenting with 5 day hx of gradual onset, right sided headache that was preceded with "vertigo feelings."  Pt reports pain has gradually worsened over last 5 days, radiating down right side of her body.  Pain is worst in her right eye, aching and throbbing, worse with light but not left eye. Pain is constant, 10/10, aching and throbbing.  Pt states she also noticed some "green discharge" yesterday from right eye but no swelling, redness or warmth of that eye. Reports associated nausea but also reports not eating much over the last 5 days due to headache.  Does report right eye blurriness. Denies head or eye trauma. Reports feeling well prior to onset of vertigo and headache. Denies fever, vomiting or diarrhea. Pt is not on daily medication for lupus, reports being on steroids for flares but has not had a flare in several months. States she has not had a migraine like this since she was a teenager.  PMH show hx of pain in right side of body.  Past Medical History  Diagnosis Date  . Seizure disorder   . Lupus   . Lupus (systemic lupus erythematosus) 2007  . Lupus (systemic lupus erythematosus) 2007  . Endometriosis   . Seizures     last sz 06/2013, no meds  . DVT (deep venous thrombosis)     right arm  . Migraine    Past Surgical History  Procedure Laterality Date  . Abdominal hysterectomy      x2 partial  . Cesarean section      x3   Family History  Problem Relation Age of Onset  . Cancer Sister     breast & ovarian  . Cancer Maternal Aunt     breast & ovarian  . Birth defects Maternal Uncle    History  Substance Use Topics  . Smoking status:  Never Smoker   . Smokeless tobacco: Never Used  . Alcohol Use: No   OB History   Grav Para Term Preterm Abortions TAB SAB Ect Mult Living   3 3 2 1      3      Review of Systems  Constitutional: Negative for fever, chills, diaphoresis and fatigue.  Eyes: Positive for photophobia, pain ( right) and discharge.  Respiratory: Negative for shortness of breath.   Cardiovascular: Negative for chest pain.  Gastrointestinal: Positive for nausea. Negative for vomiting.  Musculoskeletal: Positive for neck pain ( right side). Negative for neck stiffness.  Neurological: Positive for dizziness, weakness and headaches. Negative for seizures, syncope, speech difficulty and numbness.  All other systems reviewed and are negative.    Allergies  Diamox; Nsaids; Omnipaque; Compazine; Lactose intolerance (gi); and Reglan  Home Medications   Current Outpatient Rx  Name  Route  Sig  Dispense  Refill  . acetaminophen (TYLENOL) 500 MG tablet   Oral   Take 500-1,000 mg by mouth every 6 (six) hours as needed for pain.          . cholecalciferol (VITAMIN D) 1000 UNITS tablet   Oral   Take 1,000 Units by mouth every morning.         . cyanocobalamin 500 MCG tablet  Oral   Take 500 mcg by mouth every morning.         . Linaclotide (LINZESS) 290 MCG CAPS capsule   Oral   Take 1 capsule (290 mcg total) by mouth daily.   30 capsule   2   . traMADol (ULTRAM) 50 MG tablet   Oral   Take 1 tablet (50 mg total) by mouth every 6 (six) hours as needed.   15 tablet   0    BP 119/89  Pulse 97  Temp(Src) 98.7 F (37.1 C) (Oral)  Resp 24  SpO2 100%  LMP 06/14/2003 Physical Exam  Nursing note and vitals reviewed. Constitutional: She is oriented to person, place, and time. She appears well-developed and well-nourished.  Pt lying on right side in darkened exam room.  Appears uncomfortable.   HENT:  Head: Normocephalic and atraumatic.  Right Ear: Hearing, tympanic membrane, external ear and  ear canal normal.  Left Ear: Hearing, tympanic membrane, external ear and ear canal normal.  Nose: Nose normal.  Mouth/Throat: Uvula is midline, oropharynx is clear and moist and mucous membranes are normal.  Eyes: Conjunctivae and EOM are normal. Pupils are equal, round, and reactive to light. Right eye exhibits no discharge. Left eye exhibits no discharge. No scleral icterus.  Pain with palpation around right eye. No periorbital edema or erythema. No discharge.  Photophobia present during exam.  Neck: Normal range of motion. Neck supple.  No nuchal rigidity or meningeal signs.  Pt is tender along right SCM but FROM neck w/o tenderness. No midline cervical tenderness.  Cardiovascular: Normal rate, regular rhythm and normal heart sounds.   Pulmonary/Chest: Effort normal and breath sounds normal. No respiratory distress. She has no wheezes. She has no rales. She exhibits no tenderness.  Abdominal: Soft. Bowel sounds are normal. She exhibits no distension and no mass. There is no tenderness. There is no rebound and no guarding.  Musculoskeletal: Normal range of motion. She exhibits tenderness.  Tenderness over right SCM and right upper trapezius.  No midline spinal tenderness. 4/5 right grip strength, 4/5 right plantar flexion compared to left.  Pt admits to having some weakness on right side in past.  Neurological: She is alert and oriented to person, place, and time. No cranial nerve deficit or sensory deficit. GCS eye subscore is 4. GCS verbal subscore is 5. GCS motor subscore is 6.  Skin: Skin is warm and dry.    ED Course  Procedures (including critical care time) Labs Review Labs Reviewed  COMPREHENSIVE METABOLIC PANEL - Abnormal; Notable for the following:    Total Protein 9.2 (*)    All other components within normal limits  CBC   Imaging Review Ct Head Wo Contrast  11/17/2013   CLINICAL DATA:  Migraine headache. Right facial numbness. Right blurred vision.  EXAM: CT HEAD WITHOUT  CONTRAST  TECHNIQUE: Contiguous axial images were obtained from the base of the skull through the vertex without intravenous contrast.  COMPARISON:  06/10/2011.  FINDINGS: Normal appearing cerebral hemispheres and posterior fossa structures. Normal size and position of the ventricles. No intracranial hemorrhage, mass lesion or CT evidence of acute infarction. Unremarkable bones and included paranasal sinuses with the exception of left inferior maxillary sinus retention cyst formation.  IMPRESSION: No intracranial abnormality.   Electronically Signed   By: Gordan Payment M.D.   On: 11/17/2013 17:31    EKG Interpretation   None       MDM   1. Headache    Pt  with hx of lupus and migraines c/o 5 day hx of right sided headache and side sides arm and leg pain. Reports last severe migraine was when she was a teenage.  Due to duration and severity of pain, will get head CT.  4/5 right UE and LE strength compared to left side, however pt reports hx of same.  Denies fevers or sick contacts. No meningeal signs.  Right eye: no signs of cellulitis.  Head CT: unremarkable.  Tx: morphine as pt was a hard stick, IV was unable to be started.  ODT zofran given.  Pt reported improvement in pain and able to keep down several ounces of ginger ale. Stated she felt comfortable being discharged home. Requested pt resource guide as she does not have a PCP at this time.   All labs/imaging/findings discussed with patient. All questions answered and concerns addressed. Will discharge pt home and have pt f/u with [facility] and [facility] info provided. Return precautions given. Pt verbalized understanding and agreement with tx plan. Vitals: unremarkable. Discharged in stable condition.    Discussed pt with Dr. Deretha Emory during ED encounter and agrees with plan.            Junius Finner, PA-C 11/17/13 2009

## 2013-11-17 NOTE — ED Notes (Addendum)
Pt tolerated water well po.

## 2013-11-17 NOTE — ED Notes (Addendum)
Pt shaking vigorously, when assessing pt, this RN asked why. Pt states she is freezing. PT given warm blankets. Pt's temperature assessed, 98.7. Pt given ginger ale to drink when she is ready to attempt her fluid challenge.

## 2013-11-17 NOTE — ED Notes (Signed)
Multiple RN;s attempted to get IV access; PA aware.

## 2013-11-17 NOTE — ED Notes (Signed)
Sodium chloride bolus discontinued as there is no IV access.

## 2013-11-17 NOTE — ED Notes (Signed)
Second RN looking for IV.

## 2013-11-17 NOTE — ED Notes (Signed)
Pt complains of migraine that began 5 days ago. Pt experienced "vertigo feelings" before migraine started, then migraine hit. Pt is not sensitive to light but is nauseous. Pt has not eaten much over the last 5 days. Pt complains of right facial numbness and blurred vision in right eye.

## 2013-11-17 NOTE — ED Notes (Signed)
Multiple attempts by RN for IV failed; pt to CT; PA aware.

## 2013-11-17 NOTE — ED Notes (Signed)
Pt reports she feels better; PA aware.

## 2013-11-17 NOTE — ED Notes (Signed)
Attempted to gain IV access X 2, unsuccessful.  

## 2013-11-18 NOTE — ED Provider Notes (Signed)
Medical screening examination/treatment/procedure(s) were performed by non-physician practitioner and as supervising physician I was immediately available for consultation/collaboration.  EKG Interpretation   None         Shelda Jakes, MD 11/18/13 [phone]

## 2013-11-30 ENCOUNTER — Encounter (INDEPENDENT_AMBULATORY_CARE_PROVIDER_SITE_OTHER): Payer: Medicaid Other | Admitting: General Surgery

## 2013-12-14 ENCOUNTER — Emergency Department (HOSPITAL_COMMUNITY): Payer: Medicaid Other

## 2013-12-14 ENCOUNTER — Emergency Department (HOSPITAL_COMMUNITY)
Admission: EM | Admit: 2013-12-14 | Discharge: 2013-12-15 | Disposition: A | Discharge status: Home / Self Care | Payer: Medicaid Other | Attending: Emergency Medicine | Admitting: Emergency Medicine

## 2013-12-14 ENCOUNTER — Encounter (HOSPITAL_COMMUNITY): Payer: Self-pay | Admitting: Emergency Medicine

## 2013-12-14 DIAGNOSIS — K625 Hemorrhage of anus and rectum: Secondary | ICD-10-CM | POA: Insufficient documentation

## 2013-12-14 DIAGNOSIS — Z8679 Personal history of other diseases of the circulatory system: Secondary | ICD-10-CM | POA: Insufficient documentation

## 2013-12-14 DIAGNOSIS — Z8742 Personal history of other diseases of the female genital tract: Secondary | ICD-10-CM | POA: Insufficient documentation

## 2013-12-14 DIAGNOSIS — Z8669 Personal history of other diseases of the nervous system and sense organs: Secondary | ICD-10-CM | POA: Insufficient documentation

## 2013-12-14 DIAGNOSIS — K5900 Constipation, unspecified: Secondary | ICD-10-CM | POA: Insufficient documentation

## 2013-12-14 DIAGNOSIS — Z79899 Other long term (current) drug therapy: Secondary | ICD-10-CM | POA: Insufficient documentation

## 2013-12-14 DIAGNOSIS — Z8739 Personal history of other diseases of the musculoskeletal system and connective tissue: Secondary | ICD-10-CM | POA: Insufficient documentation

## 2013-12-14 DIAGNOSIS — Z86718 Personal history of other venous thrombosis and embolism: Secondary | ICD-10-CM | POA: Insufficient documentation

## 2013-12-14 LAB — CBC WITH DIFFERENTIAL/PLATELET
Basophils Absolute: 0 10*3/uL (ref 0.0–0.1)
Basophils Relative: 0 % (ref 0–1)
EOS ABS: 0.1 10*3/uL (ref 0.0–0.7)
Eosinophils Relative: 1 % (ref 0–5)
HCT: 38.1 % (ref 36.0–46.0)
HEMOGLOBIN: 12.5 g/dL (ref 12.0–15.0)
LYMPHS ABS: 2.1 10*3/uL (ref 0.7–4.0)
Lymphocytes Relative: 33 % (ref 12–46)
MCH: 27.5 pg (ref 26.0–34.0)
MCHC: 32.8 g/dL (ref 30.0–36.0)
MCV: 83.7 fL (ref 78.0–100.0)
MONOS PCT: 7 % (ref 3–12)
Monocytes Absolute: 0.4 10*3/uL (ref 0.1–1.0)
NEUTROS PCT: 59 % (ref 43–77)
Neutro Abs: 3.7 10*3/uL (ref 1.7–7.7)
Platelets: 348 10*3/uL (ref 150–400)
RBC: 4.55 MIL/uL (ref 3.87–5.11)
RDW: 13 % (ref 11.5–15.5)
WBC: 6.3 10*3/uL (ref 4.0–10.5)

## 2013-12-14 LAB — COMPREHENSIVE METABOLIC PANEL
ALK PHOS: 78 U/L (ref 39–117)
ALT: 19 U/L (ref 0–35)
AST: 24 U/L (ref 0–37)
Albumin: 4.2 g/dL (ref 3.5–5.2)
BUN: 10 mg/dL (ref 6–23)
CO2: 24 mEq/L (ref 19–32)
Calcium: 9.9 mg/dL (ref 8.4–10.5)
Chloride: 99 mEq/L (ref 96–112)
Creatinine, Ser: 0.64 mg/dL (ref 0.50–1.10)
GLUCOSE: 92 mg/dL (ref 70–99)
POTASSIUM: 4 meq/L (ref 3.7–5.3)
Sodium: 137 mEq/L (ref 137–147)
TOTAL PROTEIN: 7.5 g/dL (ref 6.0–8.3)
Total Bilirubin: 0.4 mg/dL (ref 0.3–1.2)

## 2013-12-14 LAB — URINALYSIS, ROUTINE W REFLEX MICROSCOPIC
Bilirubin Urine: NEGATIVE
Glucose, UA: NEGATIVE mg/dL
Hgb urine dipstick: NEGATIVE
Ketones, ur: NEGATIVE mg/dL
LEUKOCYTES UA: NEGATIVE
Nitrite: NEGATIVE
PROTEIN: NEGATIVE mg/dL
Specific Gravity, Urine: 1.013 (ref 1.005–1.030)
UROBILINOGEN UA: 0.2 mg/dL (ref 0.0–1.0)
pH: 6 (ref 5.0–8.0)

## 2013-12-14 LAB — GLUCOSE, CAPILLARY: Glucose-Capillary: 84 mg/dL (ref 70–99)

## 2013-12-14 LAB — OCCULT BLOOD, POC DEVICE: Fecal Occult Bld: NEGATIVE

## 2013-12-14 MED ORDER — HYDROMORPHONE HCL PF 1 MG/ML IJ SOLN
1.0000 mg | Freq: Once | INTRAMUSCULAR | Status: AC
  Administered 2013-12-14: 1 mg via INTRAVENOUS
  Filled 2013-12-14: qty 1

## 2013-12-14 MED ORDER — ONDANSETRON HCL 4 MG/2ML IJ SOLN
4.0000 mg | Freq: Once | INTRAMUSCULAR | Status: AC
  Administered 2013-12-14: 4 mg via INTRAVENOUS
  Filled 2013-12-14: qty 2

## 2013-12-14 MED ORDER — ONDANSETRON 8 MG PO TBDP: 8.0000 mg | ORAL_TABLET | Freq: Once | ORAL | Status: DC

## 2013-12-14 MED ORDER — HYDROMORPHONE HCL PF 2 MG/ML IJ SOLN: 2.0000 mg | Freq: Once | INTRAMUSCULAR | Status: DC

## 2013-12-14 NOTE — ED Notes (Signed)
Pt presents with c/o abdominal pain and irregular bowel patterns. Pt says that for 2 days she has been unable to control her bowels and has had some bowel incontinence. Pt also c/o blood in her stool.

## 2013-12-14 NOTE — ED Provider Notes (Signed)
CSN: [id number]     Arrival date & time 12/14/13  1735 History   First MD Initiated Contact with Patient 12/14/13 2040     Chief Complaint  Patient presents with  . Abdominal Pain  . Rectal Bleeding   (Consider location/radiation/quality/duration/timing/severity/associated sxs/prior Treatment) HPI Comments: Donna MatteJeneria M [name] is a 43 y.o. female presents for evaluation of several days of leaking stool, liquid, with urgency to have bowel movements, continuously. She denies urinary incontinence. She has abdominal pain, and bloating. She's not had vomiting. She is using her regular medications. She does not believe that she is constipated. She denies fever, chills, cough, back pain, dizziness, chest pain. There are no other known modifying factors.   Patient is a 43 y.o. female presenting with abdominal pain and hematochezia. The history is provided by the patient.  Abdominal Pain Associated symptoms: hematochezia   Rectal Bleeding Associated symptoms: abdominal pain     Past Medical History  Diagnosis Date  . Seizure disorder   . Lupus   . Lupus (systemic lupus erythematosus) 2007  . Lupus (systemic lupus erythematosus) 2007  . Endometriosis   . Seizures     last sz 06/2013, no meds  . DVT (deep venous thrombosis)     right arm  . Migraine    Past Surgical History  Procedure Laterality Date  . Abdominal hysterectomy      x2 partial  . Cesarean section      x3   Family History  Problem Relation Age of Onset  . Cancer Sister     breast & ovarian  . Cancer Maternal Aunt     breast & ovarian  . Birth defects Maternal Uncle    History  Substance Use Topics  . Smoking status: Never Smoker   . Smokeless tobacco: Never Used  . Alcohol Use: No   OB History   Grav Para Term Preterm Abortions TAB SAB Ect Mult Living   3 3 2 1      3      Review of Systems  Gastrointestinal: Positive for abdominal pain and hematochezia.  All other systems reviewed and are  negative.    Allergies  Diamox; Nsaids; Omnipaque; Compazine; Lactose intolerance (gi); and Reglan  Home Medications   Current Outpatient Rx  Name  Route  Sig  Dispense  Refill  . acetaminophen (TYLENOL) 500 MG tablet   Oral   Take 500-1,000 mg by mouth every 6 (six) hours as needed for pain.          . cholecalciferol (VITAMIN D) 1000 UNITS tablet   Oral   Take 1,000 Units by mouth every morning.         . cyanocobalamin 500 MCG tablet   Oral   Take 500 mcg by mouth every morning.         . diphenhydrAMINE (BENADRYL) 25 mg capsule   Oral   Take 25 mg by mouth every 6 (six) hours as needed for allergies.         . Linaclotide (LINZESS) 290 MCG CAPS capsule   Oral   Take 1 capsule (290 mcg total) by mouth daily.   30 capsule   2   . oxyCODONE-acetaminophen (PERCOCET/ROXICET) 5-325 MG per tablet   Oral   Take 1 tablet by mouth every 4 (four) hours as needed for severe pain.         . traMADol (ULTRAM) 50 MG tablet   Oral   Take 1 tablet (50 mg total) by mouth  every 6 (six) hours as needed.   15 tablet   0    BP 141/92  Pulse 91  Temp(Src) 97.5 F (36.4 C) (Oral)  Resp 20  SpO2 100%  LMP 06/14/2003 Physical Exam  Nursing note and vitals reviewed. Constitutional: She is oriented to person, place, and time. She appears well-developed and well-nourished.  HENT:  Head: Normocephalic and atraumatic.  Eyes: Conjunctivae and EOM are normal. Pupils are equal, round, and reactive to light.  Neck: Normal range of motion and phonation normal. Neck supple.  Cardiovascular: Normal rate, regular rhythm and intact distal pulses.   Pulmonary/Chest: Effort normal and breath sounds normal. She exhibits no tenderness.  Abdominal: Soft. She exhibits distension. She exhibits no mass. There is tenderness (diffuse, moderate). There is no rebound and no guarding.  Musculoskeletal: Normal range of motion.  Neurological: She is alert and oriented to person, place, and time.  She exhibits normal muscle tone.  Skin: Skin is warm and dry.  Psychiatric: She has a normal mood and affect. Her behavior is normal. Judgment and thought content normal.    ED Course  Procedures (including critical care time) Labs Review Labs Reviewed  CBC WITH DIFFERENTIAL  COMPREHENSIVE METABOLIC PANEL  URINALYSIS, ROUTINE W REFLEX MICROSCOPIC  GLUCOSE, CAPILLARY  OCCULT BLOOD, POC DEVICE   Imaging Review Dg Abd Acute W/chest  12/14/2013   CLINICAL DATA:  Abdominal pain.  EXAM: ACUTE ABDOMEN SERIES (ABDOMEN 2 VIEW & CHEST 1 VIEW)  COMPARISON:  Chest x-ray in 08/19/2013. CT of the abdomen and pelvis 10/04/2013.  FINDINGS: Lung volumes are normal. No consolidative airspace disease. No pleural effusions. No pneumothorax. No pulmonary nodule or mass noted. Pulmonary vasculature and the cardiomediastinal silhouette are within normal limits.  Supine and upright views of the abdomen demonstrate retained oral contrast material within the distal colon and rectum. There is a large stool burden. No pathologic distention of small bowel. No gross evidence of pneumoperitoneum.  IMPRESSION: 1. Nonobstructive bowel gas pattern. 2. No pneumoperitoneum. 3. Retained oral contrast material within the distal colon and rectum with large stool burden, which could indicate constipation. 4. No radiographic evidence of acute cardiopulmonary disease.   Electronically Signed   By: Trudie Reed M.D.   On: 12/14/2013 22:40    EKG Interpretation   None       MDM   1. Constipation    Evaluation is consistent with constipation, as well as abdominal pain, and bloating. Constipations is recurrent. It is unclear what additional medication will help her in the long-term. In the short term she will benefit from use of additional enemas.   The patient appears reasonably screened and/or stabilized for discharge and I doubt any other medical condition or other [facility] requiring further screening, evaluation, or treatment  in the ED at this time prior to discharge.  Nursing Notes Reviewed/ Care Coordinated, and agree without changes. Applicable Imaging Reviewed.  Interpretation of Laboratory Data incorporated into ED treatment    Plan: Home Medications- fleets enema; Home Treatments and Observation- rest, fluids, fiber; return here if the recommended treatment, does not improve the symptoms; Recommended follow up- GI followup as soon as possible      Flint Melter, MD 12/15/13 0030

## 2013-12-15 NOTE — Discharge Instructions (Signed)
Use Fleets enema 2 or 3 times, tomorrow to help improve your stooling.     Constipation, Adult Constipation is when a person has fewer than 3 bowel movements a week; has difficulty having a bowel movement; or has stools that are dry, hard, or larger than normal. As people grow older, constipation is more common. If you try to fix constipation with medicines that make you have a bowel movement (laxatives), the problem may get worse. Long-term laxative use may cause the muscles of the colon to become weak. A low-fiber diet, not taking in enough fluids, and taking certain medicines may make constipation worse. CAUSES   Certain medicines, such as antidepressants, pain medicine, iron supplements, antacids, and water pills.   Certain diseases, such as diabetes, irritable bowel syndrome (IBS), thyroid disease, or depression.   Not drinking enough water.   Not eating enough fiber-rich foods.   Stress or travel.  Lack of physical activity or exercise.  Not going to the restroom when there is the urge to have a bowel movement.  Ignoring the urge to have a bowel movement.  Using laxatives too much. SYMPTOMS   Having fewer than 3 bowel movements a week.   Straining to have a bowel movement.   Having hard, dry, or larger than normal stools.   Feeling full or bloated.   Pain in the lower abdomen.  Not feeling relief after having a bowel movement. DIAGNOSIS  Your caregiver will take a medical history and perform a physical exam. Further testing may be done for severe constipation. Some tests may include:   A barium enema X-ray to examine your rectum, colon, and sometimes, your small intestine.  A sigmoidoscopy to examine your lower colon.  A colonoscopy to examine your entire colon. TREATMENT  Treatment will depend on the severity of your constipation and what is causing it. Some dietary treatments include drinking more fluids and eating more fiber-rich foods. Lifestyle  treatments may include regular exercise. If these diet and lifestyle recommendations do not help, your caregiver may recommend taking over-the-counter laxative medicines to help you have bowel movements. Prescription medicines may be prescribed if over-the-counter medicines do not work.  HOME CARE INSTRUCTIONS   Increase dietary fiber in your diet, such as fruits, vegetables, whole grains, and beans. Limit high-fat and processed sugars in your diet, such as [place] fries, hamburgers, cookies, candies, and soda.   A fiber supplement may be added to your diet if you cannot get enough fiber from foods.   Drink enough fluids to keep your urine clear or pale yellow.   Exercise regularly or as directed by your caregiver.   Go to the restroom when you have the urge to go. Do not hold it.  Only take medicines as directed by your caregiver. Do not take other medicines for constipation without talking to your caregiver first. SEEK IMMEDIATE MEDICAL CARE IF:   You have bright red blood in your stool.   Your constipation lasts for more than 4 days or gets worse.   You have abdominal or rectal pain.   You have thin, pencil-like stools.  You have unexplained weight loss. MAKE SURE YOU:   Understand these instructions.  Will watch your condition.  Will get help right away if you are not doing well or get worse. Document Released: 08/28/2004 Document Revised: 02/22/2012 Document Reviewed: 11/03/2011 [facility] Patient Information 2014 [place], [place].  Fiber Content in Foods Drinking plenty of fluids and consuming foods high in fiber can help with constipation.  See the list below for the fiber content of some common foods. Starches and Grains / Dietary Fiber (g)  Cheerios, 1 cup / 3 g  Kellogg's Corn Flakes, 1 cup / 0.7 g  Rice Krispies, 1  cup / 0.3 g  Quaker Oat Life Cereal,  cup / 2.1 g  Oatmeal, instant (cooked),  cup / 2 g  Kellogg's Frosted Mini Wheats, 1 cup / 5.1  g  Rice, brown, long-grain (cooked), 1 cup / 3.5 g  Rice, white, long-grain (cooked), 1 cup / 0.6 g  Macaroni, cooked, enriched, 1 cup / 2.5 g Legumes / Dietary Fiber (g)  Beans, baked, canned, plain or vegetarian,  cup / 5.2 g  Beans, kidney, canned,  cup / 6.8 g  Beans, pinto, dried (cooked),  cup / 7.7 g  Beans, pinto, canned,  cup / 5.5 g Breads and Crackers / Dietary Fiber (g)  Graham crackers, plain or honey, 2 squares / 0.7 g  Saltine crackers, 3 squares / 0.3 g  Pretzels, plain, salted, 10 pieces / 1.8 g  Bread, whole-wheat, 1 slice / 1.9 g  Bread, white, 1 slice / 0.7 g  Bread, raisin, 1 slice / 1.2 g  Bagel, plain, 3 oz / 2 g  Tortilla, flour, 1 oz / 0.9 g  Tortilla, corn, 1 small / 1.5 g  Bun, hamburger or hotdog, 1 small / 0.9 g Fruits / Dietary Fiber (g)  Apple, raw with skin, 1 medium / 4.4 g  Applesauce, sweetened,  cup / 1.5 g  Banana,  medium / 1.5 g  Grapes, 10 grapes / 0.4 g  Orange, 1 small / 2.3 g  Raisin, 1.5 oz / 1.6 g  Melon, 1 cup / 1.4 g Vegetables / Dietary Fiber (g)  Green beans, canned,  cup / 1.3 g  Carrots (cooked),  cup / 2.3 g  Broccoli (cooked),  cup / 2.8 g  Peas, frozen (cooked),  cup / 4.4 g  Potatoes, mashed,  cup / 1.6 g  Lettuce, 1 cup / 0.5 g  Corn, canned,  cup / 1.6 g  Tomato,  cup / 1.1 g Document Released: 04/18/2007 Document Revised: 02/22/2012 Document Reviewed: 06/13/2007 [employer] Patient Information 2014 [place].

## 2013-12-22 ENCOUNTER — Ambulatory Visit (INDEPENDENT_AMBULATORY_CARE_PROVIDER_SITE_OTHER): Payer: Medicaid Other | Admitting: Obstetrics & Gynecology

## 2013-12-22 ENCOUNTER — Encounter: Payer: Self-pay | Admitting: Obstetrics & Gynecology

## 2013-12-22 VITALS — BP 117/80 | HR 92 | Ht 63.0 in | Wt 162.6 lb

## 2013-12-22 DIAGNOSIS — N7013 Chronic salpingitis and oophoritis: Secondary | ICD-10-CM

## 2013-12-22 DIAGNOSIS — N7011 Chronic salpingitis: Secondary | ICD-10-CM

## 2013-12-22 MED ORDER — GABAPENTIN 600 MG PO TABS: ORAL_TABLET | ORAL | Status: DC

## 2013-12-25 NOTE — Progress Notes (Signed)
Referral form filled out and records faxed to [facility]. They will contact patient for appt.

## 2014-01-01 ENCOUNTER — Telehealth: Payer: Self-pay | Admitting: General Practice

## 2014-01-01 NOTE — Telephone Encounter (Signed)
Patient called and left message stating she is a patient of Dr dove and [facility] but she can't be seen until 3/24 and she is currently taking neurontin to help with the pain but it isn't helping and she just doesn't know what to do because she spends more time in the ER than at home because of all her pain and would like a call back.

## 2014-01-01 NOTE — Telephone Encounter (Signed)
Called patient stating I was returning her phone call from this morning and that Dr Marice Potterove is not in the office today but one of us will try to reach her tomorrow and call you back when we hear from her. Patient verbalized understanding and stated she's been taking the neurontin for a week now and it hasn't really helped. Patient had no further questions

## 2014-01-03 MED ORDER — TRAMADOL HCL 50 MG PO TABS: 50.0000 mg | ORAL_TABLET | Freq: Four times a day (QID) | ORAL | Status: DC | PRN

## 2014-01-03 NOTE — Telephone Encounter (Signed)
Called pt after consult w/Dr. Marice Potterove. I asked what dose of Neurontin she is currently taking. She stated that she is taking 300mg  TID. I recommended that she increase the morning dose to 600 mg for several days. Then she can increase the afternoon dose to 600 mg for several days before finally increasing the evening dose to 600 mg. She stated that she was only able to afford a partial amount of the Rx because Medicaid did not cover the cost. I advised pt to call and speak to her pharmacist to find out why because I believe they do cover it. We can change the Rx if necessary. I also asked pt if she would like a Rx for Ultram since she is unable to take ANSAIDS and she said yes. Pt was informed that she may pick up the Rx later today. She voiced understanding of all information and instructions.

## 2014-01-08 ENCOUNTER — Encounter (HOSPITAL_COMMUNITY): Payer: Self-pay | Admitting: Anesthesiology

## 2014-01-08 ENCOUNTER — Telehealth: Payer: Self-pay | Admitting: *Deleted

## 2014-01-08 ENCOUNTER — Inpatient Hospital Stay (HOSPITAL_COMMUNITY)
Admission: AD | Admit: 2014-01-08 | Discharge: 2014-01-09 | Disposition: A | Discharge status: Home / Self Care | Payer: Medicaid Other | Source: Ambulatory Visit | Attending: Obstetrics & Gynecology | Admitting: Obstetrics & Gynecology

## 2014-01-08 ENCOUNTER — Inpatient Hospital Stay (HOSPITAL_COMMUNITY): Payer: Medicaid Other

## 2014-01-08 ENCOUNTER — Encounter (HOSPITAL_COMMUNITY): Payer: Self-pay | Admitting: *Deleted

## 2014-01-08 DIAGNOSIS — N7011 Chronic salpingitis: Secondary | ICD-10-CM

## 2014-01-08 DIAGNOSIS — R112 Nausea with vomiting, unspecified: Secondary | ICD-10-CM | POA: Insufficient documentation

## 2014-01-08 DIAGNOSIS — N7013 Chronic salpingitis and oophoritis: Secondary | ICD-10-CM | POA: Insufficient documentation

## 2014-01-08 DIAGNOSIS — N83209 Unspecified ovarian cyst, unspecified side: Secondary | ICD-10-CM | POA: Insufficient documentation

## 2014-01-08 DIAGNOSIS — R1031 Right lower quadrant pain: Secondary | ICD-10-CM | POA: Insufficient documentation

## 2014-01-08 DIAGNOSIS — Z90710 Acquired absence of both cervix and uterus: Secondary | ICD-10-CM | POA: Insufficient documentation

## 2014-01-08 LAB — URINE MICROSCOPIC-ADD ON

## 2014-01-08 LAB — COMPREHENSIVE METABOLIC PANEL
ALBUMIN: 3.9 g/dL (ref 3.5–5.2)
ALT: 14 U/L (ref 0–35)
AST: 18 U/L (ref 0–37)
Alkaline Phosphatase: 85 U/L (ref 39–117)
BUN: 12 mg/dL (ref 6–23)
CO2: 25 mEq/L (ref 19–32)
CREATININE: 0.63 mg/dL (ref 0.50–1.10)
Calcium: 9.5 mg/dL (ref 8.4–10.5)
Chloride: 99 mEq/L (ref 96–112)
GFR calc Af Amer: >90 mL/min (ref >90)
GFR calc non Af Amer: >90 mL/min (ref >90)
GLUCOSE: 92 mg/dL (ref 70–99)
Potassium: 3.7 mEq/L (ref 3.7–5.3)
Sodium: 135 mEq/L — ABNORMAL LOW (ref 137–147)
TOTAL PROTEIN: 7.7 g/dL (ref 6.0–8.3)

## 2014-01-08 LAB — URINALYSIS, ROUTINE W REFLEX MICROSCOPIC
Bilirubin Urine: NEGATIVE
GLUCOSE, UA: NEGATIVE mg/dL
Ketones, ur: NEGATIVE mg/dL
LEUKOCYTES UA: NEGATIVE
NITRITE: NEGATIVE
PH: 5.5 (ref 5.0–8.0)
PROTEIN: NEGATIVE mg/dL
Specific Gravity, Urine: 1.025 (ref 1.005–1.030)
Urobilinogen, UA: 0.2 mg/dL (ref 0.0–1.0)

## 2014-01-08 LAB — CBC
HEMATOCRIT: 34.3 % — AB (ref 36.0–46.0)
Hemoglobin: 11.3 g/dL — ABNORMAL LOW (ref 12.0–15.0)
MCH: 27 pg (ref 26.0–34.0)
MCHC: 32.9 g/dL (ref 30.0–36.0)
MCV: 81.9 fL (ref 78.0–100.0)
Platelets: 320 10*3/uL (ref 150–400)
RBC: 4.19 MIL/uL (ref 3.87–5.11)
RDW: 12.9 % (ref 11.5–15.5)
WBC: 5.8 10*3/uL (ref 4.0–10.5)

## 2014-01-08 MED ORDER — HYDROMORPHONE HCL PF 2 MG/ML IJ SOLN
2.0000 mg | INTRAMUSCULAR | Status: DC | PRN
  Administered 2014-01-08 (×2): 2 mg via INTRAVENOUS
  Filled 2014-01-08 (×2): qty 1

## 2014-01-08 MED ORDER — HYDROMORPHONE HCL PF 1 MG/ML IJ SOLN: 1.0000 mg | Freq: Once | INTRAMUSCULAR | Status: DC

## 2014-01-08 MED ORDER — HYDROMORPHONE HCL PF 1 MG/ML IJ SOLN
INTRAMUSCULAR | Status: AC
  Filled 2014-01-08: qty 1

## 2014-01-08 MED ORDER — LACTATED RINGERS IV BOLUS (SEPSIS)
1000.0000 mL | Freq: Once | INTRAVENOUS | Status: AC
  Administered 2014-01-08: 1000 mL via INTRAVENOUS

## 2014-01-08 MED ORDER — LACTATED RINGERS IV SOLN
INTRAVENOUS | Status: DC
  Administered 2014-01-08: 125 mL/h via INTRAVENOUS

## 2014-01-08 MED ORDER — ONDANSETRON HCL 4 MG/2ML IJ SOLN
4.0000 mg | Freq: Once | INTRAMUSCULAR | Status: AC
  Administered 2014-01-08: 4 mg via INTRAVENOUS
  Filled 2014-01-08: qty 2

## 2014-01-08 NOTE — Telephone Encounter (Signed)
Called Medicaid and they state capsules are the preferred route and do not require preauth. Called [facility] pharmacy and informed they capsules are preferred and to change order to capsules . Called Judithann GravesJeneria and left a message we got her message and we think we have her medication straightened out- please give pharmacy a few hours to get it filled. Call us if any questions.

## 2014-01-08 NOTE — Telephone Encounter (Signed)
Donna [name] left a  Long message stating she spoke with a nurse last week about a medication. States she can't get into the [facility] clinic [facility] until [facility]. States they discussed her picking up another mediciation, but she is still trying to hold off until [facility]'t gotten neurontin Dr. Marice Potterove prescribed. States she bought x amount of pills for $30, but now completely out. States [facility] still hasn't gotten it straightened out where medicaid will pay for it.  States [facility] states she needs to pay $187 which she doesn't have. States at this point she can barely walk, States she is too sick to come get the prescription  And doesn't know what to do.  Wants to know if she can send a family member to pick up prescription.  States she has spoke to [facility] twice and doesn't know what else to do to get medicaid to pay for neurontin.

## 2014-01-08 NOTE — Telephone Encounter (Signed)
[facility] [phone] to find out problem with medicaid. [facility] states this medication requires prior authorization and clinic needs to call to get preauth. At [phone].

## 2014-01-08 NOTE — Progress Notes (Signed)
2 unsuccessful attempts to start IV. Order for dilaudid changed to IM so as not to delay pain medication. Patient declines IM medication, stating she is "OK" and will wait for IV and IV med.

## 2014-01-08 NOTE — MAU Note (Signed)
Patient states she started having abdominal pain on and off for last "week or so." [facility]. States she still has her right tube and ovary. States she had surgery after that for endometriosis. States still has endometriosis with possible bowel involvement. States she has seen Dr. Marice Potterove. Was going to have right tube and ovary removed, but after reviewing her history, Dr. Marice Potterove said it would be too involved. Dr. Marice Potterove referred her to a pain clinic, but no appointment until March. Dr. Marice Potterove prescribed Neurontin in the meantime. States had an [facility] yesterday at [facility] and was told she had a mass. Wanted her to go to their main hospital, but patient wanted to return to her own doctor. Patient states she was given pain medication at this urgent care, started vomiting before she got home. Pain has continued to worsen.

## 2014-01-08 NOTE — MAU Provider Note (Signed)
History     CSN: [id number]  Arrival date and time: 01/08/14 1753   First Provider Initiated Contact with Patient 01/08/14 1842      Chief Complaint  Patient presents with  . Abdominal Pain   Abdominal Pain    Donna [name] is a 43 y.o. [phone] who presents today with RLQ pain. She was seen at [facility] on 01/04/14 and had pelvic ultrasound and CT that showed a large ovarian cyst that may or may not be torsed. She states that the pain then got significantly got worse on Sunday, and today it is 10/10. She is having nausea and vomiting with the pain. She denies any fever.   Normal appendix per CT 01/04/2014.  Past Medical History  Diagnosis Date  . Seizure disorder   . Lupus   . Lupus (systemic lupus erythematosus) 2007  . Lupus (systemic lupus erythematosus) 2007  . Endometriosis   . Seizures     last sz 06/2013, no meds  . DVT (deep venous thrombosis)     right arm  . Migraine     Past Surgical History  Procedure Laterality Date  . Abdominal hysterectomy      x2 partial  . Cesarean section      x3    Family History  Problem Relation Age of Onset  . Cancer Sister     breast & ovarian  . Cancer Maternal Aunt     breast & ovarian  . Birth defects Maternal Uncle     History  Substance Use Topics  . Smoking status: Never Smoker   . Smokeless tobacco: Never Used  . Alcohol Use: No    Allergies:  Allergies  Allergen Reactions  . Diamox [Acetazolamide] Anaphylaxis  . Nsaids Anaphylaxis  . Omnipaque [Iohexol] Hives and Itching    Pt given Benadryl after reaction (at [facility]); IV Benadryl given prior to CT 03/27/2013; pt tolerated procedure without problems.  . Compazine [Prochlorperazine] Hives  . Lactose Intolerance (Gi) Nausea And Vomiting  . Reglan [Metoclopramide] Hives    Prescriptions prior to admission  Medication Sig Dispense Refill  . acetaminophen (TYLENOL) 500 MG tablet Take 500-1,000 mg by mouth every 6 (six) hours as needed for pain.       .  cholecalciferol (VITAMIN D) 1000 UNITS tablet Take 1,000 Units by mouth every morning.      . cyanocobalamin 500 MCG tablet Take 500 mcg by mouth every morning.      . diphenhydrAMINE (BENADRYL) 25 mg capsule Take 25 mg by mouth every 6 (six) hours as needed for allergies.      Marland Kitchen gabapentin (NEURONTIN) 600 MG tablet 600 mg by mouth TID, build up to this dose gradually.  90 tablet  6  . Linaclotide (LINZESS) 290 MCG CAPS capsule Take 1 capsule (290 mcg total) by mouth daily.  30 capsule  2  . oxyCODONE-acetaminophen (PERCOCET/ROXICET) 5-325 MG per tablet Take 1 tablet by mouth every 4 (four) hours as needed for severe pain.      . traMADol (ULTRAM) 50 MG tablet Take 1 tablet (50 mg total) by mouth every 6 (six) hours as needed.  30 tablet  0    Review of Systems  Gastrointestinal: Positive for abdominal pain.   Physical Exam   Blood pressure 132/86, pulse 97, temperature 98.5 F (36.9 C), resp. rate 20, height 5\' 3"  (1.6 m), weight 75.751 kg (167 lb), last menstrual period 06/14/2003, SpO2 100.00%.  Physical Exam  Nursing note and vitals reviewed. Constitutional:  She is oriented to person, place, and time. She appears well-developed and well-nourished. She appears distressed (breathing rapidy through the pain, writhing in the bed. ).  Cardiovascular: Normal rate.   Respiratory: Effort normal.  GI: Soft. There is tenderness. There is rebound and guarding.  Neurological: She is alert and oriented to person, place, and time.  Skin: Skin is warm and dry.  Psychiatric: She has a normal mood and affect.    MAU Course  Procedures  Results for orders placed during the hospital encounter of 01/08/14 (from the past 24 hour(s))  CBC     Status: Abnormal   Collection Time    01/08/14  6:06 PM      Result Value Range   WBC 5.8  4.0 - 10.5 K/uL   RBC 4.19  3.87 - 5.11 MIL/uL   Hemoglobin 11.3 (*) 12.0 - 15.0 g/dL   HCT 16.1 (*) 09.6 - 04.5 %   MCV 81.9  78.0 - 100.0 fL   MCH 27.0  26.0 -  34.0 pg   MCHC 32.9  30.0 - 36.0 g/dL   RDW 40.9  81.1 - 91.4 %   Platelets 320  150 - 400 K/uL  COMPREHENSIVE METABOLIC PANEL     Status: Abnormal   Collection Time    01/08/14  6:06 PM      Result Value Range   Sodium 135 (*) 137 - 147 mEq/L   Potassium 3.7  3.7 - 5.3 mEq/L   Chloride 99  96 - 112 mEq/L   CO2 25  19 - 32 mEq/L   Glucose, Bld 92  70 - 99 mg/dL   BUN 12  6 - 23 mg/dL   Creatinine, Ser 7.82  0.50 - 1.10 mg/dL   Calcium 9.5  8.4 - 95.6 mg/dL   Total Protein 7.7  6.0 - 8.3 g/dL   Albumin 3.9  3.5 - 5.2 g/dL   AST 18  0 - 37 U/L   ALT 14  0 - 35 U/L   Alkaline Phosphatase 85  39 - 117 U/L   Total Bilirubin <0.2 (*) 0.3 - 1.2 mg/dL   GFR calc non Af Amer >90  >90 mL/min   GFR calc Af Amer >90  >90 mL/min  URINALYSIS, ROUTINE W REFLEX MICROSCOPIC     Status: Abnormal   Collection Time    01/08/14  6:23 PM      Result Value Range   Color, Urine YELLOW  YELLOW   APPearance CLEAR  CLEAR   Specific Gravity, Urine 1.025  1.005 - 1.030   pH 5.5  5.0 - 8.0   Glucose, UA NEGATIVE  NEGATIVE mg/dL   Hgb urine dipstick TRACE (*) NEGATIVE   Bilirubin Urine NEGATIVE  NEGATIVE   Ketones, ur NEGATIVE  NEGATIVE mg/dL   Protein, ur NEGATIVE  NEGATIVE mg/dL   Urobilinogen, UA 0.2  0.0 - 1.0 mg/dL   Nitrite NEGATIVE  NEGATIVE   Leukocytes, UA NEGATIVE  NEGATIVE  URINE MICROSCOPIC-ADD ON     Status: Abnormal   Collection Time    01/08/14  6:23 PM      Result Value Range   Squamous Epithelial / LPF FEW (*) RARE   WBC, UA 0-2  <3 WBC/hpf   RBC / HPF 0-2  <3 RBC/hpf   [place] pending 1940: Care turned over to oncoming CNM  Assessment and Plan    Tawnya Crook 01/08/2014, 6:46 PM   Dorathy Kinsman, CNM assumed care of pt at  2030.   Dilaudid and Zofran given IV.   US Transvaginal Non-ob  01/08/2014   CLINICAL DATA:  Severe right-sided pain, history of right-sided hydrosalpinx.  EXAM: TRANSABDOMINAL AND TRANSVAGINAL ULTRASOUND OF PELVIS  DOPPLER ULTRASOUND OF OVARIES   TECHNIQUE: Both transabdominal and transvaginal ultrasound examinations of the pelvis were performed. Transabdominal technique was performed for global imaging of the pelvis including uterus, ovaries, adnexal regions, and pelvic cul-de-sac.  It was necessary to proceed with endovaginal exam following the transabdominal exam to visualize the endometrium and adnexa. Color and duplex Doppler ultrasound was utilized to evaluate blood flow to the ovaries.  COMPARISON:  07/21/2013  FINDINGS: Uterus  Surgically absent.  Endometrium  Not applicable.  Right ovary  Measurements: 4.8 x 3.2 x 3.4 cm. Within the right ovary, there is a 2.7 x 3.0 x 2.5 cm heterogeneous cyst without intralesional flow.  Left ovary  Surgically absent.  .  Pulsed Doppler evaluation of right ovary demonstrates normal low-resistance arterial and venous waveforms.  Other findings  Anechoic dilatation of the right fallopian tube.  No free fluid.  IMPRESSION: 3 cm right ovarian lesion is favored to reflect a hemorrhagic cyst or perhaps an endometrioma. Recommend 6 week ultrasound follow-up to document resolution.  Color Doppler flow with arterial and venous waveforms documented to the right ovary.  Large right hydrosalpinx persists.   Electronically Signed   By: Jearld Lesch M.D.   On: 01/08/2014 23:28   US Pelvis Complete  01/08/2014   CLINICAL DATA:  Severe right-sided pain, history of right-sided hydrosalpinx.  EXAM: TRANSABDOMINAL AND TRANSVAGINAL ULTRASOUND OF PELVIS  DOPPLER ULTRASOUND OF OVARIES  TECHNIQUE: Both transabdominal and transvaginal ultrasound examinations of the pelvis were performed. Transabdominal technique was performed for global imaging of the pelvis including uterus, ovaries, adnexal regions, and pelvic cul-de-sac.  It was necessary to proceed with endovaginal exam following the transabdominal exam to visualize the endometrium and adnexa. Color and duplex Doppler ultrasound was utilized to evaluate blood flow to the  ovaries.  COMPARISON:  07/21/2013  FINDINGS: Uterus  Surgically absent.  Endometrium  Not applicable.  Right ovary  Measurements: 4.8 x 3.2 x 3.4 cm. Within the right ovary, there is a 2.7 x 3.0 x 2.5 cm heterogeneous cyst without intralesional flow.  Left ovary  Surgically absent.  .  Pulsed Doppler evaluation of right ovary demonstrates normal low-resistance arterial and venous waveforms.  Other findings  Anechoic dilatation of the right fallopian tube.  No free fluid.  IMPRESSION: 3 cm right ovarian lesion is favored to reflect a hemorrhagic cyst or perhaps an endometrioma. Recommend 6 week ultrasound follow-up to document resolution.  Color Doppler flow with arterial and venous waveforms documented to the right ovary.  Large right hydrosalpinx persists.   Electronically Signed   By: Jearld Lesch M.D.   On: 01/08/2014 23:28   [place] Art/ven Flow Abd Pelv Doppler  01/08/2014   CLINICAL DATA:  Severe right-sided pain, history of right-sided hydrosalpinx.  EXAM: TRANSABDOMINAL AND TRANSVAGINAL ULTRASOUND OF PELVIS  DOPPLER ULTRASOUND OF OVARIES  TECHNIQUE: Both transabdominal and transvaginal ultrasound examinations of the pelvis were performed. Transabdominal technique was performed for global imaging of the pelvis including uterus, ovaries, adnexal regions, and pelvic cul-de-sac.  It was necessary to proceed with endovaginal exam following the transabdominal exam to visualize the endometrium and adnexa. Color and duplex Doppler ultrasound was utilized to evaluate blood flow to the ovaries.  COMPARISON:  07/21/2013  FINDINGS: Uterus  Surgically absent.  Endometrium  Not applicable.  Right ovary  Measurements: 4.8 x 3.2 x 3.4 cm. Within the right ovary, there is a 2.7 x 3.0 x 2.5 cm heterogeneous cyst without intralesional flow.  Left ovary  Surgically absent.  .  Pulsed Doppler evaluation of right ovary demonstrates normal low-resistance arterial and venous waveforms.  Other findings  Anechoic dilatation of the  right fallopian tube.  No free fluid.  IMPRESSION: 3 cm right ovarian lesion is favored to reflect a hemorrhagic cyst or perhaps an endometrioma. Recommend 6 week ultrasound follow-up to document resolution.  Color Doppler flow with arterial and venous waveforms documented to the right ovary.  Large right hydrosalpinx persists.   Electronically Signed   By: Jearld LeschAndrew  DelGaizo M.D.   On: 01/08/2014 23:28   Hydrosalpinx measurements C/W CT's from 2014. Pt states she has a cyst in her right ovary for "years" declines scheduled F/U US.   Pt felt better after Dilaudid, but was again in severe pain 2 hours later. Second dose given. US results review. Pt extremely relieved that there was no evidence of torsion. Was very scared about possible emergency surgery. Much calmer after results. Reviewed results w/ Dr. Marice Potterove. No further Gyn eval necessary at this time. Has referral to Pelvic Pain Clinic. May send home w/ Dilaudid if necessary.   ASSESSMENT: 1. Hydrosalpinx    PLAN: D/C home in stable condition. Sedating medication precautions. Constipation precautions. Will send message to Dr. Hassell Doneove/clinic about possible pain med contract to get pt through until her appt 3/24.   Discussed possible non-gyn etiologies of pain. Pt feels like this is an exacerbation of her chronic  Gyn pain. [facility].   Follow-up Information   Follow up with Pelvic pain clinic  On 03/06/2014. (as scheduled)       Follow up with [facility]. (As needed if symptoms worsen)    Contact information:   [address] [place] [id number]        Medication List         acetaminophen 500 MG tablet  Commonly known as:  TYLENOL  Take 500-1,000 mg by mouth every 6 (six) hours as needed for pain.     cholecalciferol 1000 UNITS tablet  Commonly known as:  VITAMIN D  Take 1,000 Units by mouth every morning.     cyanocobalamin 500 MCG tablet  Take 500 mcg by mouth every morning.      gabapentin 600 MG tablet  Commonly known as:  NEURONTIN  Take 600 mg by mouth 3 (three) times daily as needed (For pain.).     HYDROmorphone 2 MG tablet  Commonly known as:  DILAUDID  Take 1 tablet (2 mg total) by mouth every 4 (four) hours as needed for severe pain.     Linaclotide 290 MCG Caps capsule  Commonly known as:  LINZESS  Take 1 capsule (290 mcg total) by mouth daily.     ondansetron 8 MG tablet  Commonly known as:  ZOFRAN  Take 0.5 tablets (4 mg total) by mouth every 8 (eight) hours as needed for nausea or vomiting.     oxyCODONE-acetaminophen 5-325 MG per tablet  Commonly known as:  PERCOCET/ROXICET  Take 1-2 tablets by mouth every 4 (four) hours as needed for severe pain.     zolpidem 10 MG tablet  Commonly known as:  AMBIEN  Take 1 tablet (10 mg total) by mouth at bedtime as needed for sleep.       Dorathy KinsmanVirginia Derwin Reddy, CNM  01/08/2014 9:10 PM

## 2014-01-09 ENCOUNTER — Encounter: Payer: Self-pay | Admitting: Obstetrics & Gynecology

## 2014-01-09 DIAGNOSIS — N7013 Chronic salpingitis and oophoritis: Secondary | ICD-10-CM

## 2014-01-09 MED ORDER — ZOLPIDEM TARTRATE 10 MG PO TABS: 10.0000 mg | ORAL_TABLET | Freq: Every evening | ORAL | Status: DC | PRN

## 2014-01-09 MED ORDER — HYDROMORPHONE HCL 2 MG PO TABS: 2.0000 mg | ORAL_TABLET | ORAL | Status: DC | PRN

## 2014-01-09 MED ORDER — ONDANSETRON HCL 8 MG PO TABS: 4.0000 mg | ORAL_TABLET | Freq: Three times a day (TID) | ORAL | Status: DC | PRN

## 2014-01-09 MED ORDER — GABAPENTIN 300 MG PO CAPS
600.0000 mg | ORAL_CAPSULE | Freq: Once | ORAL | Status: AC
  Administered 2014-01-09: 600 mg via ORAL
  Filled 2014-01-09: qty 2

## 2014-01-09 MED ORDER — OXYCODONE-ACETAMINOPHEN 5-325 MG PO TABS
1.0000 | ORAL_TABLET | ORAL | Status: DC | PRN
Start: 2014-01-09 — End: 2014-02-13

## 2014-01-09 NOTE — Discharge Instructions (Signed)
You have been prescribed multiple sedating medications. Do not take less than 4 hours apart.  Pelvic Pain, Female Female pelvic pain can be caused by many different things and start from a variety of places. Pelvic pain refers to pain that is located in the lower half of the abdomen and between your hips. The pain may occur over a short period of time (acute) or may be reoccurring (chronic). The cause of pelvic pain may be related to disorders affecting the female reproductive organs (gynecologic), but it may also be related to the bladder, kidney stones, an intestinal complication, or muscle or skeletal problems. Getting help right away for pelvic pain is important, especially if there has been severe, sharp, or a sudden onset of unusual pain. It is also important to get help right away because some types of pelvic pain can be life threatening.  CAUSES  Below are only some of the causes of pelvic pain. The causes of pelvic pain can be in one of several categories.   Gynecologic.  Pelvic inflammatory disease.  Sexually transmitted infection.  Ovarian cyst or a twisted ovarian ligament (ovarian torsion).  Uterine lining that grows outside the uterus (endometriosis).  Fibroids, cysts, or tumors.  Ovulation.  Pregnancy.  Pregnancy that occurs outside the uterus (ectopic pregnancy).  Miscarriage.  Labor.  Abruption of the placenta or ruptured uterus.  Infection.  Uterine infection (endometritis).  Bladder infection.  Diverticulitis.  Miscarriage related to a uterine infection (septic abortion).  Bladder.  Inflammation of the bladder (cystitis).  Kidney stone(s).  Gastrointenstinal.  Constipation.  Diverticulitis.  Neurologic.  Trauma.  Feeling pelvic pain because of mental or emotional causes (psychosomatic).  Cancers of the bowel or pelvis. EVALUATION  Your caregiver will want to take a careful history of your concerns. This includes recent changes in your  health, a careful gynecologic history of your periods (menses), and a sexual history. Obtaining your family history and medical history is also important. Your caregiver may suggest a pelvic exam. A pelvic exam will help identify the location and severity of the pain. It also helps in the evaluation of which organ system may be involved. In order to identify the cause of the pelvic pain and be properly treated, your caregiver may order tests. These tests may include:   A pregnancy test.  Pelvic ultrasonography.  An X-ray exam of the abdomen.  A urinalysis or evaluation of vaginal discharge.  Blood tests. HOME CARE INSTRUCTIONS   Only take over-the-counter or prescription medicines for pain, discomfort, or fever as directed by your caregiver.   Rest as directed by your caregiver.   Eat a balanced diet.   Drink enough fluids to make your urine clear or pale yellow, or as directed.   Avoid sexual intercourse if it causes pain.   Apply warm or cold compresses to the lower abdomen depending on which one helps the pain.   Avoid stressful situations.   Keep a journal of your pelvic pain. Write down when it started, where the pain is located, and if there are things that seem to be associated with the pain, such as food or your menstrual cycle.  Follow up with your caregiver as directed.  SEEK MEDICAL CARE IF:  Your medicine does not help your pain.  You have abnormal vaginal discharge. SEEK IMMEDIATE MEDICAL CARE IF:   You have heavy bleeding from the vagina.   Your pelvic pain increases.   You feel lightheaded or faint.   You have chills.  You have pain with urination or blood in your urine.   You have uncontrolled diarrhea or vomiting.   You have a fever or persistent symptoms for more than 3 days.  You have a fever and your symptoms suddenly get worse.   You are being physically or sexually abused.  MAKE SURE YOU:  Understand these  instructions.  Will watch your condition.  Will get help if you are not doing well or get worse. Document Released: 10/27/2004 Document Revised: 05/31/2012 Document Reviewed: 03/21/2012 [facility] Patient Information 2014 [place], [place].  Abdominal Pain, Women Abdominal (stomach, pelvic, or belly) pain can be caused by many things. It is important to tell your doctor:  The location of the pain.  Does it come and go or is it present all the time?  Are there things that start the pain (eating certain foods, exercise)?  Are there other symptoms associated with the pain (fever, nausea, vomiting, diarrhea)? All of this is helpful to know when trying to find the cause of the pain. CAUSES   Stomach: virus or bacteria infection, or ulcer.  Intestine: appendicitis (inflamed appendix), regional ileitis (Crohn's disease), ulcerative colitis (inflamed colon), irritable bowel syndrome, diverticulitis (inflamed diverticulum of the colon), or cancer of the stomach or intestine.  Gallbladder disease or stones in the gallbladder.  Kidney disease, kidney stones, or infection.  Pancreas infection or cancer.  Fibromyalgia (pain disorder).  Diseases of the female organs:  Uterus: fibroid (non-cancerous) tumors or infection.  Fallopian tubes: infection or tubal pregnancy.  Ovary: cysts or tumors.  Pelvic adhesions (scar tissue).  Endometriosis (uterus lining tissue growing in the pelvis and on the pelvic organs).  Pelvic congestion syndrome (female organs filling up with blood just before the menstrual period).  Pain with the menstrual period.  Pain with ovulation (producing an egg).  Pain with an IUD (intrauterine device, birth control) in the uterus.  Cancer of the female organs.  Functional pain (pain not caused by a disease, may improve without treatment).  Psychological pain.  Depression. DIAGNOSIS  Your doctor will decide the seriousness of your pain by doing an  examination.  Blood tests.  X-rays.  Ultrasound.  CT scan (computed tomography, special type of X-ray).  MRI (magnetic resonance imaging).  Cultures, for infection.  Barium enema (dye inserted in the large intestine, to better view it with X-rays).  Colonoscopy (looking in intestine with a lighted tube).  Laparoscopy (minor surgery, looking in abdomen with a lighted tube).  Major abdominal exploratory surgery (looking in abdomen with a large incision). TREATMENT  The treatment will depend on the cause of the pain.   Many cases can be observed and treated at home.  Over-the-counter medicines recommended by your caregiver.  Prescription medicine.  Antibiotics, for infection.  Birth control pills, for painful periods or for ovulation pain.  Hormone treatment, for endometriosis.  Nerve blocking injections.  Physical therapy.  Antidepressants.  Counseling with a psychologist or psychiatrist.  Minor or major surgery. HOME CARE INSTRUCTIONS   Do not take laxatives, unless directed by your caregiver.  Take over-the-counter pain medicine only if ordered by your caregiver. Do not take aspirin because it can cause an upset stomach or bleeding.  Try a clear liquid diet (broth or water) as ordered by your caregiver. Slowly move to a bland diet, as tolerated, if the pain is related to the stomach or intestine.  Have a thermometer and take your temperature several times a day, and record it.  Bed rest and sleep,  if it helps the pain.  Avoid sexual intercourse, if it causes pain.  Avoid stressful situations.  Keep your follow-up appointments and tests, as your caregiver orders.  If the pain does not go away with medicine or surgery, you may try:  Acupuncture.  Relaxation exercises (yoga, meditation).  Group therapy.  Counseling. SEEK MEDICAL CARE IF:   You notice certain foods cause stomach pain.  Your home care treatment is not helping your pain.  You  need stronger pain medicine.  You want your IUD removed.  You feel faint or lightheaded.  You develop nausea and vomiting.  You develop a rash.  You are having side effects or an allergy to your medicine. SEEK IMMEDIATE MEDICAL CARE IF:   Your pain does not go away or gets worse.  You have a fever.  Your pain is felt only in portions of the abdomen. The right side could possibly be appendicitis. The left lower portion of the abdomen could be colitis or diverticulitis.  You are passing blood in your stools (bright red or black tarry stools, with or without vomiting).  You have blood in your urine.  You develop chills, with or without a fever.  You pass out. MAKE SURE YOU:   Understand these instructions.  Will watch your condition.  Will get help right away if you are not doing well or get worse. Document Released: 09/27/2007 Document Revised: 02/22/2012 Document Reviewed: 10/17/2009 [facility] Patient Information 2014 [place], [place].  Sedative Medication  An overdose is when more drugs are taken than recommended. The risk of serious problems from overdosing on any sedative depends on the amount of drug taken and whether it is mixed with other drugs or alcohol.   Lorazepam.  Flurazepam.  Triazolam.  Chlordiazepoxide.  Oxazepam.  Diazepam.  Alprazolam.  Opiate pain medications  Sleeping pills Sedatives may be prescribed for insomnia, anxiety, muscle tension, pain and alcohol or drug withdrawal symptoms. SYMPTOMS A sedative overdose causes the following symptoms:  Loss of coordination.  Slurred speech.  Slowed breathing.  Poor judgment.  Memory loss.  Drowsiness.  Blackouts.  Coma. Taking too many sedatives can cause:  Respiratory depression.  Vomiting.  Dehydration.  Low blood pressure.  Death. HOME CARE INSTRUCTIONS  Do not take sedating medications less than four hours apart.  SEEK IMMEDIATE MEDICAL CARE IF:   You develop  recurrent dizziness or weakness or you faint.  You have trouble breathing.  You have a seizure. Document Released: 01/07/2005 Document Revised: 02/22/2012 Document Reviewed: 12/04/2009 [facility] Patient Information 2014 [place], [place].

## 2014-01-31 ENCOUNTER — Ambulatory Visit: Payer: Medicaid Other | Admitting: Obstetrics & Gynecology

## 2014-02-11 HISTORY — PX: MASS EXCISION: SHX2000

## 2014-02-13 ENCOUNTER — Inpatient Hospital Stay (HOSPITAL_COMMUNITY)
Admission: AD | Admit: 2014-02-13 | Discharge: 2014-02-14 | Disposition: A | Discharge status: Home / Self Care | Payer: Medicaid Other | Source: Ambulatory Visit | Attending: Obstetrics and Gynecology | Admitting: Obstetrics and Gynecology

## 2014-02-13 ENCOUNTER — Encounter: Payer: Self-pay | Admitting: *Deleted

## 2014-02-13 ENCOUNTER — Telehealth: Payer: Self-pay | Admitting: *Deleted

## 2014-02-13 ENCOUNTER — Encounter (HOSPITAL_COMMUNITY): Payer: Self-pay | Admitting: *Deleted

## 2014-02-13 DIAGNOSIS — N949 Unspecified condition associated with female genital organs and menstrual cycle: Secondary | ICD-10-CM | POA: Insufficient documentation

## 2014-02-13 DIAGNOSIS — N7011 Chronic salpingitis: Secondary | ICD-10-CM

## 2014-02-13 DIAGNOSIS — Z86718 Personal history of other venous thrombosis and embolism: Secondary | ICD-10-CM | POA: Insufficient documentation

## 2014-02-13 DIAGNOSIS — N7013 Chronic salpingitis and oophoritis: Secondary | ICD-10-CM | POA: Insufficient documentation

## 2014-02-13 DIAGNOSIS — M329 Systemic lupus erythematosus, unspecified: Secondary | ICD-10-CM | POA: Insufficient documentation

## 2014-02-13 DIAGNOSIS — G8929 Other chronic pain: Secondary | ICD-10-CM | POA: Insufficient documentation

## 2014-02-13 DIAGNOSIS — R109 Unspecified abdominal pain: Secondary | ICD-10-CM | POA: Insufficient documentation

## 2014-02-13 DIAGNOSIS — N83209 Unspecified ovarian cyst, unspecified side: Secondary | ICD-10-CM | POA: Insufficient documentation

## 2014-02-13 DIAGNOSIS — N83201 Unspecified ovarian cyst, right side: Secondary | ICD-10-CM

## 2014-02-13 LAB — CBC
HCT: 35 % — ABNORMAL LOW (ref 36.0–46.0)
Hemoglobin: 11.5 g/dL — ABNORMAL LOW (ref 12.0–15.0)
MCH: 26.7 pg (ref 26.0–34.0)
MCHC: 32.9 g/dL (ref 30.0–36.0)
MCV: 81.4 fL (ref 78.0–100.0)
Platelets: 312 10*3/uL (ref 150–400)
RBC: 4.3 MIL/uL (ref 3.87–5.11)
RDW: 13.7 % (ref 11.5–15.5)
WBC: 5.2 10*3/uL (ref 4.0–10.5)

## 2014-02-13 MED ORDER — ONDANSETRON 8 MG PO TBDP
8.0000 mg | ORAL_TABLET | Freq: Once | ORAL | Status: AC
  Administered 2014-02-13: 8 mg via ORAL
  Filled 2014-02-13: qty 1

## 2014-02-13 MED ORDER — OXYCODONE-ACETAMINOPHEN 5-325 MG PO TABS: 2.0000 | ORAL_TABLET | Freq: Once | ORAL | Status: DC

## 2014-02-13 MED ORDER — HYDROMORPHONE HCL PF 2 MG/ML IJ SOLN
2.0000 mg | Freq: Once | INTRAMUSCULAR | Status: AC
  Administered 2014-02-13: 2 mg via INTRAMUSCULAR
  Filled 2014-02-13: qty 1

## 2014-02-13 MED ORDER — GABAPENTIN 300 MG PO CAPS
600.0000 mg | ORAL_CAPSULE | Freq: Once | ORAL | Status: AC
  Administered 2014-02-13: 600 mg via ORAL
  Filled 2014-02-13: qty 2

## 2014-02-13 NOTE — MAU Note (Signed)
Pt reports she has a mass on rt side, states she has an appointment with Dr Marice Potterove tomorrow but she called them today because she can't keep anything down because the pain is so bad. States the pain meds are not helping.

## 2014-02-13 NOTE — MAU Provider Note (Signed)
Chief Complaint: Abdominal Pain   First Provider Initiated Contact with Patient 02/13/14 2244     SUBJECTIVE HPI: Donna [name] is a 43 y.o. [phone] female who presents with severe right low abd pain so severe that it is causing N/V. Long Hx of chronic pelvic pain. Know 9 x 3 c, right hydrosalpinx  And right ovarian cyst (likely endometrioma), both stable on multiple imaging modalities. Has appt w/ Dr. Marice Potter for pain contract tomorrow, but couldn't wait due to pain. Has appt at Pelvic pain clinic at [facility] on 3/24. Had been Rx'd Neurontin for pain, but was too expensive.     N/V controlled w/ phenergan taken at home.    Past Medical History  Diagnosis Date  . Lupus (systemic lupus erythematosus) 2007  . Endometriosis   . Seizures     last sz 06/2013, no meds  . DVT (deep venous thrombosis)     right arm  . Migraine    OB History  Gravida Para Term Preterm AB SAB TAB Ectopic Multiple Living  3 3 2 1      3     # Outcome Date GA Lbr Len/2nd Weight Sex Delivery Anes PTL Lv  3 PRE      LTCS   Y  2 TRM      LTCS   Y  1 TRM      LTCS   Y     Past Surgical History  Procedure Laterality Date  . Abdominal hysterectomy      x2 partial  . Cesarean section      x3   History   Social History  . Marital Status: Divorced    Spouse Name: N/A    Number of Children: 3  . Years of Education: N/A   Occupational History  . Accountant    Social History Main Topics  . Smoking status: Never Smoker   . Smokeless tobacco: Never Used  . Alcohol Use: No  . Drug Use: No  . Sexual Activity: Not Currently    Birth Control/ Protection: Surgical   Other Topics Concern  . Not on file   Social History Narrative  . No narrative on file   No current facility-administered medications on file prior to encounter.   Current Outpatient Prescriptions on File Prior to Encounter  Medication Sig Dispense Refill  . acetaminophen (TYLENOL) 500 MG tablet Take 500-1,000 mg by mouth every 6 (six)  hours as needed for moderate pain or headache.       . cholecalciferol (VITAMIN D) 1000 UNITS tablet Take 1,000 Units by mouth every morning.      . cyanocobalamin 500 MCG tablet Take 500 mcg by mouth every morning.      . Linaclotide (LINZESS) 290 MCG CAPS capsule Take 1 capsule (290 mcg total) by mouth daily.  30 capsule  2  . zolpidem (AMBIEN) 10 MG tablet Take 1 tablet (10 mg total) by mouth at bedtime as needed for sleep.  30 tablet  2   Allergies  Allergen Reactions  . Diamox [Acetazolamide] Anaphylaxis  . Nsaids Anaphylaxis  . Omnipaque [Iohexol] Hives and Itching    Pt given Benadryl after reaction (at [facility]); IV Benadryl given prior to CT 03/27/2013; pt tolerated procedure without problems.  . Compazine [Prochlorperazine] Hives  . Lactose Intolerance (Gi) Nausea And Vomiting  . Reglan [Metoclopramide] Hives    ROS: Positive for abd pain, N/V. Neg for fever, chills, urinary complaints, hematuria, flank pain, diarrhea, constipation.  OBJECTIVE Blood pressure 135/71, pulse 100, temperature 98.9 F (37.2 C), temperature source Oral, resp. rate 20, height 5\' 3"  (1.6 m), weight 76.658 kg (169 lb), last menstrual period 06/14/2003, SpO2 100.00%. GENERAL: Well-developed, well-nourished female in moderate distress.  HEENT: Normocephalic HEART: normal rate RESP: normal effort ABDOMEN: Soft, moderate TTP in right groin. Pos BS. No CVAT.  EXTREMITIES: Nontender, no edema NEURO: Alert and oriented SPECULUM EXAM: NEFG, physiologic discharge, small amount of dark red blood noted, cervix clean BIMANUAL: cervix closed; uterus normal size, no adnexal tenderness or masses. No CMT.   LAB RESULTS Results for orders placed during the hospital encounter of 02/13/14 (from the past 24 hour(s))  CBC     Status: Abnormal   Collection Time    02/13/14 11:45 PM      Result Value Ref Range   WBC 5.2  4.0 - 10.5 K/uL   RBC 4.30  3.87 - 5.11 MIL/uL   Hemoglobin 11.5 (*) 12.0 - 15.0 g/dL   HCT 40.935.0  (*) 81.136.0 - 46.0 %   MCV 81.4  78.0 - 100.0 fL   MCH 26.7  26.0 - 34.0 pg   MCHC 32.9  30.0 - 36.0 g/dL   RDW 91.413.7  78.211.5 - 95.615.5 %   Platelets 312  150 - 400 K/uL  URINALYSIS, ROUTINE W REFLEX MICROSCOPIC     Status: None   Collection Time    02/14/14 12:01 AM      Result Value Ref Range   Color, Urine YELLOW  YELLOW   APPearance CLEAR  CLEAR   Specific Gravity, Urine 1.020  1.005 - 1.030   pH 6.0  5.0 - 8.0   Glucose, UA NEGATIVE  NEGATIVE mg/dL   Hgb urine dipstick NEGATIVE  NEGATIVE   Bilirubin Urine NEGATIVE  NEGATIVE   Ketones, ur NEGATIVE  NEGATIVE mg/dL   Protein, ur NEGATIVE  NEGATIVE mg/dL   Urobilinogen, UA 0.2  0.0 - 1.0 mg/dL   Nitrite NEGATIVE  NEGATIVE   Leukocytes, UA NEGATIVE  NEGATIVE    IMAGING No results found.  MAU COURSE Consulted w/ Dr. Jolayne Pantheronstant. No new imaging indicated since this pain is c/w chronic pain. No evidence of acute abd.   Pain 5/10 after dilaudid and Neurontin.   ASSESSMENT 1. Hydrosalpinx   2. Right ovarian cyst    PLAN Discharge home in stable condition per consult w/ Dr. Jolayne Pantheronstant. Comfort measures. Given manufacturer discount info for Neurontin.      Follow-up Information   Follow up with Allie BossierVE,MYRA C., MD On 02/14/2014. (As scheduled)    Specialty:  Obstetrics and Gynecology   Contact information:   [address] [place] [id number] [phone]        Medication List         acetaminophen 500 MG tablet  Commonly known as:  TYLENOL  Take 500-1,000 mg by mouth every 6 (six) hours as needed for moderate pain or headache.     ambrisentan 10 MG tablet  Commonly known as:  LETAIRIS  Take 10 mg by mouth daily as needed.     cholecalciferol 1000 UNITS tablet  Commonly known as:  VITAMIN D  Take 1,000 Units by mouth every morning.     cyanocobalamin 500 MCG tablet  Take 500 mcg by mouth every morning.     gabapentin 300 MG capsule  Commonly known as:  NEURONTIN  Take 1-2 capsules (300-600 mg total) by mouth 3  (three) times daily.     HYDROmorphone 2  MG tablet  Commonly known as:  DILAUDID  Take 1 tablet (2 mg total) by mouth every 4 (four) hours as needed for severe pain.     Linaclotide 290 MCG Caps capsule  Commonly known as:  LINZESS  Take 1 capsule (290 mcg total) by mouth daily.     ondansetron 8 MG tablet  Commonly known as:  ZOFRAN  Take 0.5 tablets (4 mg total) by mouth every 8 (eight) hours as needed for nausea or vomiting.     oxyCODONE-acetaminophen 5-325 MG per tablet  Commonly known as:  PERCOCET/ROXICET  Take 1-2 tablets by mouth every 4 (four) hours as needed for severe pain.     promethazine 25 MG tablet  Commonly known as:  PHENERGAN  Take 25 mg by mouth every 6 (six) hours as needed for nausea or vomiting.     zolpidem 10 MG tablet  Commonly known as:  AMBIEN  Take 10 mg by mouth at bedtime as needed for sleep.     zolpidem 10 MG tablet  Commonly known as:  AMBIEN  Take 1 tablet (10 mg total) by mouth at bedtime as needed for sleep.        Dorathy Kinsman, CNM 02/14/2014  1:40 AM

## 2014-02-13 NOTE — Telephone Encounter (Signed)
Donna [name] called and left a message stating she has an appointment tomorrow but wants to know if she can be seen today. States it is to the point she can barely walk, can't keep anything down including pain medicine, is having pain that is worse and is shooting down legs. States she is miserable, doesn't want to keep going to ER.  Called Donna [name] and we discussed we cannot see her in the clinic today, and if she is feeling that bad she either needs to go to MAU or if she isn't that bad wait for appointment tomorrow; that I can't give her anything today.  She states she is coming to the MAU.

## 2014-02-14 ENCOUNTER — Telehealth: Payer: Self-pay

## 2014-02-14 ENCOUNTER — Ambulatory Visit: Payer: Medicaid Other | Admitting: Obstetrics & Gynecology

## 2014-02-14 DIAGNOSIS — N7013 Chronic salpingitis and oophoritis: Secondary | ICD-10-CM

## 2014-02-14 DIAGNOSIS — N83209 Unspecified ovarian cyst, unspecified side: Secondary | ICD-10-CM

## 2014-02-14 LAB — URINALYSIS, ROUTINE W REFLEX MICROSCOPIC
Bilirubin Urine: NEGATIVE
Glucose, UA: NEGATIVE mg/dL
Hgb urine dipstick: NEGATIVE
KETONES UR: NEGATIVE mg/dL
Leukocytes, UA: NEGATIVE
NITRITE: NEGATIVE
Protein, ur: NEGATIVE mg/dL
SPECIFIC GRAVITY, URINE: 1.02 (ref 1.005–1.030)
Urobilinogen, UA: 0.2 mg/dL (ref 0.0–1.0)
pH: 6 (ref 5.0–8.0)

## 2014-02-14 MED ORDER — GABAPENTIN 300 MG PO CAPS: 300.0000 mg | ORAL_CAPSULE | Freq: Three times a day (TID) | ORAL | Status: DC

## 2014-02-14 MED ORDER — ONDANSETRON HCL 8 MG PO TABS: 4.0000 mg | ORAL_TABLET | Freq: Three times a day (TID) | ORAL | Status: DC | PRN

## 2014-02-14 MED ORDER — HYDROMORPHONE HCL 2 MG PO TABS: 2.0000 mg | ORAL_TABLET | ORAL | Status: DC | PRN

## 2014-02-14 NOTE — Discharge Instructions (Signed)
[employer] [facility] (manufacturer of Neurontin) offer medication assistance to those who qualify.   Pelvic Pain, Female Female pelvic pain can be caused by many different things and start from a variety of places. Pelvic pain refers to pain that is located in the lower half of the abdomen and between your hips. The pain may occur over a short period of time (acute) or may be reoccurring (chronic). The cause of pelvic pain may be related to disorders affecting the female reproductive organs (gynecologic), but it may also be related to the bladder, kidney stones, an intestinal complication, or muscle or skeletal problems. Getting help right away for pelvic pain is important, especially if there has been severe, sharp, or a sudden onset of unusual pain. It is also important to get help right away because some types of pelvic pain can be life threatening.  CAUSES  Below are only some of the causes of pelvic pain. The causes of pelvic pain can be in one of several categories.   Gynecologic.  Pelvic inflammatory disease.  Sexually transmitted infection.  Ovarian cyst or a twisted ovarian ligament (ovarian torsion).  Uterine lining that grows outside the uterus (endometriosis).  Fibroids, cysts, or tumors.  Ovulation.  Pregnancy.  Pregnancy that occurs outside the uterus (ectopic pregnancy).  Miscarriage.  Labor.  Abruption of the placenta or ruptured uterus.  Infection.  Uterine infection (endometritis).  Bladder infection.  Diverticulitis.  Miscarriage related to a uterine infection (septic abortion).  Bladder.  Inflammation of the bladder (cystitis).  Kidney stone(s).  Gastrointenstinal.  Constipation.  Diverticulitis.  Neurologic.  Trauma.  Feeling pelvic pain because of mental or emotional causes (psychosomatic).  Cancers of the bowel or pelvis. EVALUATION  Your caregiver will want to take a careful history of your concerns. This includes recent changes in  your health, a careful gynecologic history of your periods (menses), and a sexual history. Obtaining your family history and medical history is also important. Your caregiver may suggest a pelvic exam. A pelvic exam will help identify the location and severity of the pain. It also helps in the evaluation of which organ system may be involved. In order to identify the cause of the pelvic pain and be properly treated, your caregiver may order tests. These tests may include:   A pregnancy test.  Pelvic ultrasonography.  An X-ray exam of the abdomen.  A urinalysis or evaluation of vaginal discharge.  Blood tests. HOME CARE INSTRUCTIONS   Only take over-the-counter or prescription medicines for pain, discomfort, or fever as directed by your caregiver.   Rest as directed by your caregiver.   Eat a balanced diet.   Drink enough fluids to make your urine clear or pale yellow, or as directed.   Avoid sexual intercourse if it causes pain.   Apply warm or cold compresses to the lower abdomen depending on which one helps the pain.   Avoid stressful situations.   Keep a journal of your pelvic pain. Write down when it started, where the pain is located, and if there are things that seem to be associated with the pain, such as food or your menstrual cycle.  Follow up with your caregiver as directed.  SEEK MEDICAL CARE IF:  Your medicine does not help your pain.  You have abnormal vaginal discharge. SEEK IMMEDIATE MEDICAL CARE IF:   You have heavy bleeding from the vagina.   Your pelvic pain increases.   You feel lightheaded or faint.   You have chills.   You  have pain with urination or blood in your urine.   You have uncontrolled diarrhea or vomiting.   You have a fever or persistent symptoms for more than 3 days.  You have a fever and your symptoms suddenly get worse.   You are being physically or sexually abused.  MAKE SURE YOU:  Understand these  instructions.  Will watch your condition.  Will get help if you are not doing well or get worse. Document Released: 10/27/2004 Document Revised: 05/31/2012 Document Reviewed: 03/21/2012 [facility] Patient Information 2014 [place].  Abdominal Pain, Women Abdominal (stomach, pelvic, or belly) pain can be caused by many things. It is important to tell your doctor:  The location of the pain.  Does it come and go or is it present all the time?  Are there things that start the pain (eating certain foods, exercise)?  Are there other symptoms associated with the pain (fever, nausea, vomiting, diarrhea)? All of this is helpful to know when trying to find the cause of the pain. CAUSES   Stomach: virus or bacteria infection, or ulcer.  Intestine: appendicitis (inflamed appendix), regional ileitis (Crohn's disease), ulcerative colitis (inflamed colon), irritable bowel syndrome, diverticulitis (inflamed diverticulum of the colon), or cancer of the stomach or intestine.  Gallbladder disease or stones in the gallbladder.  Kidney disease, kidney stones, or infection.  Pancreas infection or cancer.  Fibromyalgia (pain disorder).  Diseases of the female organs:  Uterus: fibroid (non-cancerous) tumors or infection.  Fallopian tubes: infection or tubal pregnancy.  Ovary: cysts or tumors.  Pelvic adhesions (scar tissue).  Endometriosis (uterus lining tissue growing in the pelvis and on the pelvic organs).  Pelvic congestion syndrome (female organs filling up with blood just before the menstrual period).  Pain with the menstrual period.  Pain with ovulation (producing an egg).  Pain with an IUD (intrauterine device, birth control) in the uterus.  Cancer of the female organs.  Functional pain (pain not caused by a disease, may improve without treatment).  Psychological pain.  Depression. DIAGNOSIS  Your doctor will decide the seriousness of your pain by doing an  examination.  Blood tests.  X-rays.  Ultrasound.  CT scan (computed tomography, special type of X-ray).  MRI (magnetic resonance imaging).  Cultures, for infection.  Barium enema (dye inserted in the large intestine, to better view it with X-rays).  Colonoscopy (looking in intestine with a lighted tube).  Laparoscopy (minor surgery, looking in abdomen with a lighted tube).  Major abdominal exploratory surgery (looking in abdomen with a large incision). TREATMENT  The treatment will depend on the cause of the pain.   Many cases can be observed and treated at home.  Over-the-counter medicines recommended by your caregiver.  Prescription medicine.  Antibiotics, for infection.  Birth control pills, for painful periods or for ovulation pain.  Hormone treatment, for endometriosis.  Nerve blocking injections.  Physical therapy.  Antidepressants.  Counseling with a psychologist or psychiatrist.  Minor or major surgery. HOME CARE INSTRUCTIONS   Do not take laxatives, unless directed by your caregiver.  Take over-the-counter pain medicine only if ordered by your caregiver. Do not take aspirin because it can cause an upset stomach or bleeding.  Try a clear liquid diet (broth or water) as ordered by your caregiver. Slowly move to a bland diet, as tolerated, if the pain is related to the stomach or intestine.  Have a thermometer and take your temperature several times a day, and record it.  Bed rest and sleep, if  it helps the pain.  Avoid sexual intercourse, if it causes pain.  Avoid stressful situations.  Keep your follow-up appointments and tests, as your caregiver orders.  If the pain does not go away with medicine or surgery, you may try:  Acupuncture.  Relaxation exercises (yoga, meditation).  Group therapy.  Counseling. SEEK MEDICAL CARE IF:   You notice certain foods cause stomach pain.  Your home care treatment is not helping your pain.  You  need stronger pain medicine.  You want your IUD removed.  You feel faint or lightheaded.  You develop nausea and vomiting.  You develop a rash.  You are having side effects or an allergy to your medicine. SEEK IMMEDIATE MEDICAL CARE IF:   Your pain does not go away or gets worse.  You have a fever.  Your pain is felt only in portions of the abdomen. The right side could possibly be appendicitis. The left lower portion of the abdomen could be colitis or diverticulitis.  You are passing blood in your stools (bright red or black tarry stools, with or without vomiting).  You have blood in your urine.  You develop chills, with or without a fever.  You pass out. MAKE SURE YOU:   Understand these instructions.  Will watch your condition.  Will get help right away if you are not doing well or get worse. Document Released: 09/27/2007 Document Revised: 02/22/2012 Document Reviewed: 10/17/2009 [facility] Patient Information 2014 [place], [place].

## 2014-02-14 NOTE — Telephone Encounter (Signed)
Pt. Missed appointment today at 1:15 with Dr. Marice Potterove. Called pt. No answer. Left message stating if she still has pain or concerns please call clinic to reschedule appointment. No need to re-schedule immediately at this time.

## 2014-02-15 ENCOUNTER — Telehealth: Payer: Self-pay | Admitting: General Practice

## 2014-02-15 DIAGNOSIS — N83201 Unspecified ovarian cyst, right side: Secondary | ICD-10-CM

## 2014-02-15 DIAGNOSIS — N7011 Chronic salpingitis: Secondary | ICD-10-CM

## 2014-02-15 NOTE — Telephone Encounter (Signed)
[facility] called for neurontin dispense quantity verification. Per Dr Jolayne Pantheronstant disp 60 with no refills and this will be the last pain Rx patient will get from our office, this dose is for her to make it to her appt in [place]. Called patient and a woman answered stating she was not in. Asked woman to have Donna [name] call us back. Patient did miss her appt yesterday with Dr Marice Potterove

## 2014-02-16 NOTE — Telephone Encounter (Signed)
Pt.called stating she is returning our call.

## 2014-02-19 NOTE — MAU Provider Note (Signed)
Attestation of Attending Supervision of Advanced Practitioner (CNM/NP): Evaluation and management procedures were performed by the Advanced Practitioner under my supervision and collaboration.  I have reviewed the Advanced Practitioner's note and chart, and I agree with the management and plan.  Laquincy Eastridge 02/19/2014 12:53 PM   

## 2014-02-20 MED ORDER — GABAPENTIN 300 MG PO CAPS: 300.0000 mg | ORAL_CAPSULE | Freq: Three times a day (TID) | ORAL | Status: DC

## 2014-02-20 NOTE — Telephone Encounter (Signed)
Called pt and left messsage with her sister and informed her to tell patient that she [facility] an [facility] and if she has any questions to give us a call here at the clinics and to please make sure that we will not be able to fill again.  Sister said ok with no further questions.

## 2014-04-13 ENCOUNTER — Other Ambulatory Visit: Payer: Self-pay | Admitting: Nurse Practitioner

## 2014-04-16 NOTE — Telephone Encounter (Signed)
Last refill was done by Scheryl DarterJames Arnold, MD for Tramadol. Denied refill, patient needs to contact Dr. Debroah LoopArnold. Information is below.  Dose: 50 mg Route: Oral Frequency: Every 6 hours PRN  Dispense Quantity: 30 tablet Refills: 0 Fills Remaining: 0        Sig: Take 1 tablet (50 mg total) by mouth every 6 (six) hours as needed.       Discontinue Date: 01/08/2014 1942 Discontinue User: Liam RogersKristyn N Sanders, CPHT Discontinue Reason: Patient has not taken in last 30 days  Written Date: 01/03/14 Expiration Date: 07/02/14    Start Date: 01/03/14 End Date: 01/08/14    Ordering Provider: Adam PhenixJames G Arnold, MD Authorizing Provider: Adam PhenixJames G Arnold, MD Ordering User: Drucilla Schmidtiane L Day, RN

## 2014-04-16 NOTE — Telephone Encounter (Signed)
Patient had procedures with Dr. Rhea BeltonPyrtle in 2014

## 2014-07-13 ENCOUNTER — Encounter: Payer: Self-pay | Admitting: *Deleted

## 2014-07-14 ENCOUNTER — Encounter (HOSPITAL_COMMUNITY): Payer: Self-pay | Admitting: Emergency Medicine

## 2014-07-14 ENCOUNTER — Emergency Department (HOSPITAL_COMMUNITY)
Admission: EM | Admit: 2014-07-14 | Discharge: 2014-07-15 | Disposition: A | Discharge status: Home / Self Care | Payer: Medicaid Other | Attending: Emergency Medicine | Admitting: Emergency Medicine

## 2014-07-14 ENCOUNTER — Emergency Department (HOSPITAL_COMMUNITY): Payer: Medicaid Other

## 2014-07-14 DIAGNOSIS — Z86718 Personal history of other venous thrombosis and embolism: Secondary | ICD-10-CM | POA: Diagnosis not present

## 2014-07-14 DIAGNOSIS — Y9389 Activity, other specified: Secondary | ICD-10-CM | POA: Diagnosis not present

## 2014-07-14 DIAGNOSIS — Z79899 Other long term (current) drug therapy: Secondary | ICD-10-CM | POA: Diagnosis not present

## 2014-07-14 DIAGNOSIS — S0993XA Unspecified injury of face, initial encounter: Secondary | ICD-10-CM | POA: Insufficient documentation

## 2014-07-14 DIAGNOSIS — Y9229 Other specified public building as the place of occurrence of the external cause: Secondary | ICD-10-CM | POA: Diagnosis not present

## 2014-07-14 DIAGNOSIS — Z8742 Personal history of other diseases of the female genital tract: Secondary | ICD-10-CM | POA: Diagnosis not present

## 2014-07-14 DIAGNOSIS — S79929A Unspecified injury of unspecified thigh, initial encounter: Principal | ICD-10-CM

## 2014-07-14 DIAGNOSIS — S0990XA Unspecified injury of head, initial encounter: Secondary | ICD-10-CM | POA: Insufficient documentation

## 2014-07-14 DIAGNOSIS — Z8739 Personal history of other diseases of the musculoskeletal system and connective tissue: Secondary | ICD-10-CM | POA: Diagnosis not present

## 2014-07-14 DIAGNOSIS — X500XXA Overexertion from strenuous movement or load, initial encounter: Secondary | ICD-10-CM | POA: Insufficient documentation

## 2014-07-14 DIAGNOSIS — S199XXA Unspecified injury of neck, initial encounter: Secondary | ICD-10-CM

## 2014-07-14 DIAGNOSIS — R51 Headache: Secondary | ICD-10-CM

## 2014-07-14 DIAGNOSIS — G40909 Epilepsy, unspecified, not intractable, without status epilepticus: Secondary | ICD-10-CM | POA: Insufficient documentation

## 2014-07-14 DIAGNOSIS — W19XXXA Unspecified fall, initial encounter: Secondary | ICD-10-CM

## 2014-07-14 DIAGNOSIS — G43909 Migraine, unspecified, not intractable, without status migrainosus: Secondary | ICD-10-CM | POA: Insufficient documentation

## 2014-07-14 DIAGNOSIS — M25551 Pain in right hip: Secondary | ICD-10-CM

## 2014-07-14 DIAGNOSIS — W1809XA Striking against other object with subsequent fall, initial encounter: Secondary | ICD-10-CM | POA: Diagnosis not present

## 2014-07-14 DIAGNOSIS — S79919A Unspecified injury of unspecified hip, initial encounter: Secondary | ICD-10-CM | POA: Diagnosis not present

## 2014-07-14 DIAGNOSIS — R519 Headache, unspecified: Secondary | ICD-10-CM

## 2014-07-14 MED ORDER — ONDANSETRON HCL 4 MG/2ML IJ SOLN
4.0000 mg | Freq: Once | INTRAMUSCULAR | Status: AC
  Administered 2014-07-14: 4 mg via INTRAVENOUS
  Filled 2014-07-14: qty 2

## 2014-07-14 MED ORDER — MORPHINE SULFATE 4 MG/ML IJ SOLN
4.0000 mg | Freq: Once | INTRAMUSCULAR | Status: AC
  Administered 2014-07-14: 4 mg via INTRAVENOUS
  Filled 2014-07-14: qty 1

## 2014-07-14 NOTE — ED Notes (Signed)
Patient states she fell as she was exiting a store this evening. States her right leg gave out and she heard a popping sound. Patient states she hit her head on the glass of the door. Patient also c/o head and neck pain. Patient c/o left sided face pain as well. Patient was assisted to her vehicle after the fall, states she has been unable to walk. Patient refused 911 to be called at store.

## 2014-07-14 NOTE — ED Provider Notes (Signed)
CSN: [id number]     Arrival date & time 07/14/14  2019 History   First MD Initiated Contact with Patient 07/14/14 2140     Chief Complaint  Patient presents with  . Fall    hip and neck pain     (Consider location/radiation/quality/duration/timing/severity/associated sxs/prior Treatment) HPI Comments: Patient is a 43 yo F PMHx significant for SLE, Migraine, DVT presenting to the ED for a fall that occurred at 7:30PM this evening. Patient states she has been dealing with chronic right hip pain related to her SLE for some time now (due to follow up with Orthopedist). She states her leg gave out causing her to fall and hit her head on the edge of the door. She denies any LOC, but is now complaining of a frontal headache and left sided neck pain. Alleviating factors: none. Aggravating factors: ambulating, palpation. Medications tried prior to arrival: none.     Past Medical History  Diagnosis Date  . Seizure disorder   . Lupus   . Lupus (systemic lupus erythematosus) 2007  . Lupus (systemic lupus erythematosus) 2007  . Endometriosis   . Seizures     last sz 06/2013, no meds  . DVT (deep venous thrombosis)     right arm  . Migraine    Past Surgical History  Procedure Laterality Date  . Abdominal hysterectomy      x2 partial  . Cesarean section      x3  . Mass excision  02/2014    abd   Family History  Problem Relation Age of Onset  . Cancer Sister     breast & ovarian  . Cancer Maternal Aunt     breast & ovarian  . Birth defects Maternal Uncle    History  Substance Use Topics  . Smoking status: Never Smoker   . Smokeless tobacco: Never Used  . Alcohol Use: No   OB History   Grav Para Term Preterm Abortions TAB SAB Ect Mult Living   3 3 2 1      3      Review of Systems  Musculoskeletal: Positive for arthralgias and neck pain.  Neurological: Positive for headaches.  All other systems reviewed and are negative.     Allergies  Diamox; Nsaids; Omnipaque;  Compazine; Lactose intolerance (gi); and Reglan  Home Medications   Prior to Admission medications   Medication Sig Start Date End Date Taking? Authorizing Provider  acetaminophen (TYLENOL) 500 MG tablet Take 500-1,000 mg by mouth every 6 (six) hours as needed for moderate pain or headache.    Yes Historical Provider, MD  cholecalciferol (VITAMIN D) 1000 UNITS tablet Take 1,000 Units by mouth every morning.   Yes Historical Provider, MD  cyanocobalamin 500 MCG tablet Take 500 mcg by mouth every morning.   Yes Historical Provider, MD  gabapentin (NEURONTIN) 300 MG capsule Take 1-2 capsules (300-600 mg total) by mouth 3 (three) times daily. 02/20/14 02/20/15 Yes Peggy Constant, MD  oxyCODONE-acetaminophen (PERCOCET) 5-325 MG per tablet Take 1-2 tablets by mouth every 6 (six) hours as needed for severe pain. 07/15/14   Amaziah Raisanen L Nate Perri, PA-C   BP 113/69  Pulse 104  Temp(Src) 98.6 F (37 C) (Oral)  Resp 16  SpO2 99%  LMP 06/14/2003 Physical Exam  Nursing note and vitals reviewed. Constitutional: She is oriented to person, place, and time. She appears well-developed and well-nourished. No distress.  Patient crying.  HENT:  Head: Normocephalic and atraumatic.  Right Ear: External ear normal.  Left  Ear: External ear normal.  Nose: Nose normal.  Mouth/Throat: Oropharynx is clear and moist. No oropharyngeal exudate.  Eyes: Conjunctivae and EOM are normal. Pupils are equal, round, and reactive to light.  Neck: Normal range of motion. Neck supple.  Cardiovascular: Normal rate, regular rhythm, normal heart sounds and intact distal pulses.   Pulmonary/Chest: Effort normal and breath sounds normal. No respiratory distress.  Abdominal: Soft. There is no tenderness.  Musculoskeletal: Normal range of motion.       Right hip: She exhibits tenderness.  Neurological: She is alert and oriented to person, place, and time. She has normal strength. No cranial nerve deficit. Gait normal. GCS eye subscore  is 4. GCS verbal subscore is 5. GCS motor subscore is 6.  Sensation grossly intact.  No pronator drift.  Bilateral heel-knee-shin intact.  Skin: Skin is warm and dry. She is not diaphoretic.    ED Course  Procedures (including critical care time) Medications  oxyCODONE-acetaminophen (PERCOCET/ROXICET) 5-325 MG per tablet 2 tablet (not administered)  promethazine (PHENERGAN) tablet 25 mg (not administered)  ondansetron (ZOFRAN) injection 4 mg (4 mg Intravenous Given 07/14/14 2151)  morphine 4 MG/ML injection 4 mg (4 mg Intravenous Given 07/14/14 2317)    Labs Review Labs Reviewed - No data to display  Imaging Review Dg Hip Complete Right  07/14/2014   CLINICAL DATA:  Lateral right hip pain after a fall.  EXAM: RIGHT HIP - COMPLETE 2+ VIEW  COMPARISON:  Abdomen 12/14/2013.  CT abdomen and pelvis 10/04/2013.  FINDINGS: There is no evidence of hip fracture or dislocation. There is no evidence of arthropathy or other focal bone abnormality.  IMPRESSION: Negative.   Electronically Signed   By: Burman Nieves M.D.   On: 07/14/2014 23:58   Ct Head Wo Contrast  07/14/2014   CLINICAL DATA:  Larey Seat while walking out of a store this evening as her right leg gave out. Patient struck her head on the glass door. Headache. Neck pain.  EXAM: CT HEAD WITHOUT CONTRAST  CT CERVICAL SPINE WITHOUT CONTRAST  TECHNIQUE: Multidetector CT imaging of the head and cervical spine was performed following the standard protocol without intravenous contrast. Multiplanar CT image reconstructions of the cervical spine were also generated.  COMPARISON:  CT head 11/17/2013, 06/10/2011. No prior cervical spine CT. Cervical spine x-rays 06/10/2011.  FINDINGS: CT HEAD FINDINGS  Ventricular system normal in size and appearance for age. No mass lesion. No midline shift. No acute hemorrhage or hematoma. No extra-axial fluid collections. No evidence of acute infarction. No focal brain parenchymal abnormalities.  No skull fractures or other  focal osseous abnormalities involving the skull. Mucous retention cyst or polyp in the left maxillary sinus, unchanged. Remaining visualized paranasal sinuses, bilateral mastoid air cells, and bilateral middle ear cavities well-aerated.  CT CERVICAL SPINE FINDINGS  No fractures identified involving the cervical spine. Sagittal reconstructed images demonstrate anatomic posterior alignment. No evidence of spinal stenosis. Neural foramina widely patent throughout. Facet joints intact throughout. Disc spaces well preserved, and no significant disc protrusion identified on the soft tissue windows. Coronal reformatted images demonstrate an intact craniocervical junction, intact dens and intact lateral masses throughout.  IMPRESSION: 1. Normal intracranially. 2. Mild chronic left maxillary sinus disease, stable dating back to December, 2014. 3. Normal CT of the cervical spine.   Electronically Signed   By: Hulan Saas M.D.   On: 07/14/2014 23:55   Ct Cervical Spine Wo Contrast  07/14/2014   CLINICAL DATA:  Larey Seat while walking out of  a store this evening as her right leg gave out. Patient struck her head on the glass door. Headache. Neck pain.  EXAM: CT HEAD WITHOUT CONTRAST  CT CERVICAL SPINE WITHOUT CONTRAST  TECHNIQUE: Multidetector CT imaging of the head and cervical spine was performed following the standard protocol without intravenous contrast. Multiplanar CT image reconstructions of the cervical spine were also generated.  COMPARISON:  CT head 11/17/2013, 06/10/2011. No prior cervical spine CT. Cervical spine x-rays 06/10/2011.  FINDINGS: CT HEAD FINDINGS  Ventricular system normal in size and appearance for age. No mass lesion. No midline shift. No acute hemorrhage or hematoma. No extra-axial fluid collections. No evidence of acute infarction. No focal brain parenchymal abnormalities.  No skull fractures or other focal osseous abnormalities involving the skull. Mucous retention cyst or polyp in the left  maxillary sinus, unchanged. Remaining visualized paranasal sinuses, bilateral mastoid air cells, and bilateral middle ear cavities well-aerated.  CT CERVICAL SPINE FINDINGS  No fractures identified involving the cervical spine. Sagittal reconstructed images demonstrate anatomic posterior alignment. No evidence of spinal stenosis. Neural foramina widely patent throughout. Facet joints intact throughout. Disc spaces well preserved, and no significant disc protrusion identified on the soft tissue windows. Coronal reformatted images demonstrate an intact craniocervical junction, intact dens and intact lateral masses throughout.  IMPRESSION: 1. Normal intracranially. 2. Mild chronic left maxillary sinus disease, stable dating back to December, 2014. 3. Normal CT of the cervical spine.   Electronically Signed   By: Hulan Saas M.D.   On: 07/14/2014 23:55     EKG Interpretation None      MDM   Final diagnoses:  Fall, initial encounter  Right hip pain  Nonintractable headache, unspecified chronicity pattern, unspecified headache type    Filed Vitals:   07/14/14 2045  BP: 113/69  Pulse: 104  Temp: 98.6 F (37 C)  Resp: 16   Afebrile, NAD, non-toxic appearing, AAOx4. No neurofocal deficits. No acute physical examination findings. Mechanical fall related to chronic hip pain. Imaging reviewed w/o acute abnormality. Pain and symptoms managed in ED. Advised PCP f/u. Advised to keep scheduled ortho follow up. Return precautions discussed. Patient is agreeable to plan. Patient is stable at time of discharge      Jeannetta Ellis, PA-C 07/15/14 0104

## 2014-07-15 MED ORDER — OXYCODONE-ACETAMINOPHEN 5-325 MG PO TABS: 1.0000 | ORAL_TABLET | Freq: Four times a day (QID) | ORAL | Status: DC | PRN

## 2014-07-15 MED ORDER — PROMETHAZINE HCL 25 MG PO TABS
25.0000 mg | ORAL_TABLET | Freq: Once | ORAL | Status: AC
  Administered 2014-07-15: 25 mg via ORAL
  Filled 2014-07-15: qty 1

## 2014-07-15 MED ORDER — OXYCODONE-ACETAMINOPHEN 5-325 MG PO TABS
2.0000 | ORAL_TABLET | Freq: Once | ORAL | Status: AC
  Administered 2014-07-15: 2 via ORAL
  Filled 2014-07-15: qty 2

## 2014-07-15 NOTE — ED Notes (Signed)
Patient is alert and oriented x3.  She was given DC instructions and follow up visit instructions.  Patient gave verbal understanding. She was DC ambulatory under her own power to home.  V/S stable.  He was not showing any signs of distress on DC 

## 2014-07-15 NOTE — Discharge Instructions (Signed)
Your X-ray and CT scan were negative for any broken bones or any abnormalities in your head today. Please take pain medication and/or muscle relaxants as prescribed and as needed for pain. Please do not drive on narcotic pain medication or on muscle relaxants. Please follow up with your primary care physician in 1-2 days. If you do not have one please call the [facility] number listed above. Please keep your orthopedic appointment as scheduled. Please read all discharge instructions and return precautions.   Head Injury You have received a head injury. It does not appear serious at this time. Headaches and vomiting are common following head injury. It should be easy to awaken from sleeping. Sometimes it is necessary for you to stay in the emergency department for a while for observation. Sometimes admission to the hospital may be needed. After injuries such as yours, most problems occur within the first 24 hours, but side effects may occur up to 7-10 days after the injury. It is important for you to carefully monitor your condition and contact your health care provider or seek immediate medical care if there is a change in your condition. WHAT ARE THE TYPES OF HEAD INJURIES? Head injuries can be as minor as a bump. Some head injuries can be more severe. More severe head injuries include:  A jarring injury to the brain (concussion).  A bruise of the brain (contusion). This mean there is bleeding in the brain that can cause swelling.  A cracked skull (skull fracture).  Bleeding in the brain that collects, clots, and forms a bump (hematoma). WHAT CAUSES A HEAD INJURY? A serious head injury is most likely to happen to someone who is in a car wreck and is not wearing a seat belt. Other causes of major head injuries include bicycle or motorcycle accidents, sports injuries, and falls. HOW ARE HEAD INJURIES DIAGNOSED? A complete history of the event leading to the injury and your  current symptoms will be helpful in diagnosing head injuries. Many times, pictures of the brain, such as CT or MRI are needed to see the extent of the injury. Often, an overnight hospital stay is necessary for observation.  WHEN SHOULD I SEEK IMMEDIATE MEDICAL CARE?  You should get help right away if:  You have confusion or drowsiness.  You feel sick to your stomach (nauseous) or have continued, forceful vomiting.  You have dizziness or unsteadiness that is getting worse.  You have severe, continued headaches not relieved by medicine. Only take over-the-counter or prescription medicines for pain, fever, or discomfort as directed by your health care provider.  You do not have normal function of the arms or legs or are unable to walk.  You notice changes in the black spots in the center of the colored part of your eye (pupil).  You have a clear or bloody fluid coming from your nose or ears.  You have a loss of vision. During the next 24 hours after the injury, you must stay with someone who can watch you for the warning signs. This person should contact local emergency services (911 in the [place] if you have seizures, you become unconscious, or you are unable to wake up. HOW CAN I PREVENT A HEAD INJURY IN THE FUTURE? The most important factor for preventing major head injuries is avoiding motor vehicle accidents. To minimize the potential for damage to your head, it is crucial to wear seat belts while riding in motor vehicles. Wearing helmets while bike riding  and playing collision sports (like football) is also helpful. Also, avoiding dangerous activities around the house will further help reduce your risk of head injury.  WHEN CAN I RETURN TO NORMAL ACTIVITIES AND ATHLETICS? You should be reevaluated by your health care provider before returning to these activities. If you have any of the following symptoms, you should not return to activities or contact sports until 1 week after the symptoms  have stopped:  Persistent headache.  Dizziness or vertigo.  Poor attention and concentration.  Confusion.  Memory problems.  Nausea or vomiting.  Fatigue or tire easily.  Irritability.  Intolerant of bright lights or loud noises.  Anxiety or depression.  Disturbed sleep. MAKE SURE YOU:   Understand these instructions.  Will watch your condition.  Will get help right away if you are not doing well or get worse. Document Released: 11/30/2005 Document Revised: 12/05/2013 Document Reviewed: 08/07/2013 [facility] Patient Information 2015 [place], [place]. This information is not intended to replace advice given to you by your health care provider. Make sure you discuss any questions you have with your health care provider.

## 2014-07-15 NOTE — ED Provider Notes (Signed)
  This was a shared visit with a mid-level provided (NP or PA).  Throughout the patient's course I was available for consultation/collaboration.  I saw the ECG (if appropriate), relevant labs and studies - I agree with the interpretation.  On my exam the patient was in no distress.      Kerrin Markman, MD 07/15/14 0511 

## 2014-10-15 ENCOUNTER — Encounter (HOSPITAL_COMMUNITY): Payer: Self-pay | Admitting: Emergency Medicine

## 2015-02-03 ENCOUNTER — Emergency Department (HOSPITAL_COMMUNITY): Payer: Medicaid Other

## 2015-02-03 ENCOUNTER — Encounter (HOSPITAL_COMMUNITY): Payer: Self-pay | Admitting: Emergency Medicine

## 2015-02-03 ENCOUNTER — Emergency Department (HOSPITAL_COMMUNITY)
Admission: EM | Admit: 2015-02-03 | Discharge: 2015-02-03 | Disposition: A | Discharge status: Home / Self Care | Payer: Medicaid Other | Attending: Emergency Medicine | Admitting: Emergency Medicine

## 2015-02-03 DIAGNOSIS — Z79899 Other long term (current) drug therapy: Secondary | ICD-10-CM | POA: Insufficient documentation

## 2015-02-03 DIAGNOSIS — Z8742 Personal history of other diseases of the female genital tract: Secondary | ICD-10-CM | POA: Insufficient documentation

## 2015-02-03 DIAGNOSIS — M6289 Other specified disorders of muscle: Secondary | ICD-10-CM

## 2015-02-03 DIAGNOSIS — Z8739 Personal history of other diseases of the musculoskeletal system and connective tissue: Secondary | ICD-10-CM | POA: Insufficient documentation

## 2015-02-03 DIAGNOSIS — R531 Weakness: Secondary | ICD-10-CM | POA: Diagnosis present

## 2015-02-03 DIAGNOSIS — Z86718 Personal history of other venous thrombosis and embolism: Secondary | ICD-10-CM | POA: Diagnosis not present

## 2015-02-03 DIAGNOSIS — G43109 Migraine with aura, not intractable, without status migrainosus: Secondary | ICD-10-CM

## 2015-02-03 DIAGNOSIS — G40909 Epilepsy, unspecified, not intractable, without status epilepticus: Secondary | ICD-10-CM | POA: Diagnosis not present

## 2015-02-03 LAB — PROTIME-INR
INR: 1.08 (ref 0.00–1.49)
Prothrombin Time: 14.2 seconds (ref 11.6–15.2)

## 2015-02-03 LAB — COMPREHENSIVE METABOLIC PANEL
ALK PHOS: 109 U/L (ref 39–117)
ALT: 23 U/L (ref 0–35)
AST: 22 U/L (ref 0–37)
Albumin: 4.4 g/dL (ref 3.5–5.2)
Anion gap: 9 (ref 5–15)
BUN: 14 mg/dL (ref 6–23)
CALCIUM: 9.6 mg/dL (ref 8.4–10.5)
CO2: 25 mmol/L (ref 19–32)
Chloride: 103 mmol/L (ref 96–112)
Creatinine, Ser: 0.76 mg/dL (ref 0.50–1.10)
GFR calc Af Amer: >90 mL/min (ref >90)
GLUCOSE: 105 mg/dL — AB (ref 70–99)
Potassium: 3.9 mmol/L (ref 3.5–5.1)
Sodium: 137 mmol/L (ref 135–145)
TOTAL PROTEIN: 7.8 g/dL (ref 6.0–8.3)
Total Bilirubin: 0.6 mg/dL (ref 0.3–1.2)

## 2015-02-03 LAB — DIFFERENTIAL
BASOS ABS: 0 10*3/uL (ref 0.0–0.1)
BASOS PCT: 0 % (ref 0–1)
EOS ABS: 0.1 10*3/uL (ref 0.0–0.7)
EOS PCT: 1 % (ref 0–5)
Lymphocytes Relative: 34 % (ref 12–46)
Lymphs Abs: 2.9 10*3/uL (ref 0.7–4.0)
MONO ABS: 0.6 10*3/uL (ref 0.1–1.0)
Monocytes Relative: 7 % (ref 3–12)
NEUTROS PCT: 58 % (ref 43–77)
Neutro Abs: 5 10*3/uL (ref 1.7–7.7)

## 2015-02-03 LAB — RAPID URINE DRUG SCREEN, HOSP PERFORMED
Amphetamines: NOT DETECTED
BARBITURATES: NOT DETECTED
BENZODIAZEPINES: NOT DETECTED
COCAINE: NOT DETECTED
Opiates: NOT DETECTED
TETRAHYDROCANNABINOL: NOT DETECTED

## 2015-02-03 LAB — CBC
HCT: 39 % (ref 36.0–46.0)
Hemoglobin: 12.8 g/dL (ref 12.0–15.0)
MCH: 27.5 pg (ref 26.0–34.0)
MCHC: 32.8 g/dL (ref 30.0–36.0)
MCV: 83.7 fL (ref 78.0–100.0)
Platelets: 319 10*3/uL (ref 150–400)
RBC: 4.66 MIL/uL (ref 3.87–5.11)
RDW: 13.7 % (ref 11.5–15.5)
WBC: 8.6 10*3/uL (ref 4.0–10.5)

## 2015-02-03 LAB — I-STAT CHEM 8, ED
BUN: 18 mg/dL (ref 6–23)
CALCIUM ION: 1.14 mmol/L (ref 1.12–1.23)
CHLORIDE: 105 mmol/L (ref 96–112)
CREATININE: 0.7 mg/dL (ref 0.50–1.10)
Glucose, Bld: 108 mg/dL — ABNORMAL HIGH (ref 70–99)
HCT: 43 % (ref 36.0–46.0)
Hemoglobin: 14.6 g/dL (ref 12.0–15.0)
Potassium: 3.9 mmol/L (ref 3.5–5.1)
SODIUM: 139 mmol/L (ref 135–145)
TCO2: 20 mmol/L (ref 0–100)

## 2015-02-03 LAB — URINALYSIS, ROUTINE W REFLEX MICROSCOPIC
BILIRUBIN URINE: NEGATIVE
GLUCOSE, UA: NEGATIVE mg/dL
Hgb urine dipstick: NEGATIVE
Ketones, ur: NEGATIVE mg/dL
Leukocytes, UA: NEGATIVE
Nitrite: NEGATIVE
Protein, ur: NEGATIVE mg/dL
SPECIFIC GRAVITY, URINE: 1.021 (ref 1.005–1.030)
UROBILINOGEN UA: 0.2 mg/dL (ref 0.0–1.0)
pH: 6.5 (ref 5.0–8.0)

## 2015-02-03 LAB — I-STAT TROPONIN, ED: Troponin i, poc: 0 ng/mL (ref 0.00–0.08)

## 2015-02-03 LAB — ETHANOL: Alcohol, Ethyl (B): <5 mg/dL (ref 0–9)

## 2015-02-03 LAB — APTT: APTT: 36 s (ref 24–37)

## 2015-02-03 LAB — CBG MONITORING, ED: GLUCOSE-CAPILLARY: 76 mg/dL (ref 70–99)

## 2015-02-03 MED ORDER — HYDROMORPHONE HCL 1 MG/ML IJ SOLN
1.0000 mg | Freq: Once | INTRAMUSCULAR | Status: AC
  Administered 2015-02-03: 1 mg via INTRAVENOUS
  Filled 2015-02-03: qty 1

## 2015-02-03 MED ORDER — PROMETHAZINE HCL 25 MG/ML IJ SOLN
25.0000 mg | Freq: Once | INTRAMUSCULAR | Status: AC
  Administered 2015-02-03: 25 mg via INTRAVENOUS
  Filled 2015-02-03: qty 1

## 2015-02-03 MED ORDER — PROMETHAZINE HCL 25 MG PO TABS
25.0000 mg | ORAL_TABLET | Freq: Four times a day (QID) | ORAL | Status: DC | PRN
  Filled 2015-02-03: qty 1

## 2015-02-03 NOTE — Consult Note (Signed)
Neurology Consultation Reason for Consult: Right sided weakness Referring Physician: Ward, K   CC: Right sided weakness  History is obtained from:patient  HPI: Donna [name] is a 44 y.o. female with a history of Lupus(labs from 07/2014 show +ANA 1:160, negative smith, dsdna, lupus anticoagulant, ro, la, RNP, SCL70) though she may have been on treatment at that time. 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 Also history of DVT. She had a diagnosis of conversion disorder vs complex migraine given during a hospital stay in 07/2013.   She presents with right sided weakness that started while in the car. She was unable to speak, but would communicate with eye blinks. She did not look to the right much, but would track that way when distracted.   After MRI, she began talking more. When asked about stressors she became tearful and endorses a lot of stress with no one to talk to about it. She has seen psychologists in the past, but does not want to pursue that now.   She also complains of pain behind her right eye with photophobia.   She denies any thoughts of self harm.   LKW: 2:10 am tpa given?: no, not a stroke    ROS: Unable to obtain due to altered mental status.   Past Medical History  Diagnosis Date  . Seizure disorder   . Lupus   . Lupus (systemic lupus erythematosus) 2007  . Lupus (systemic lupus erythematosus) 2007  . Endometriosis   . Seizures     last sz 06/2013, no meds  . DVT (deep venous thrombosis)     right arm  . Migraine     Family History: Unable to assess secondary to patient's altered mental status.    Social History: Tob: Unable to assess secondary to patient's altered mental status.    Exam: Current vital signs: BP 123/101 mmHg  Pulse 90  Temp(Src) 97.7 F (36.5 C) (Axillary)  Resp 18  SpO2 100%  LMP 06/14/2003 Vital signs in last 24 hours: Temp:  [97.7 F (36.5 C)]  97.7 F (36.5 C) (02/21 0423) Pulse Rate:  [88-90] 90 (02/21 0415) Resp:  [18-26] 18 (02/21 0415) BP: (123-135)/(85-101) 123/101 mmHg (02/21 0415) SpO2:  [100 %] 100 % (02/21 0415)  Physical Exam  Constitutional: Appears well-developed and well-nourished.  Psych: Tearful Eyes: No scleral injection HENT: No OP obstrucion Head: Normocephalic.  Cardiovascular: Normal rate and regular rhythm.  Respiratory: Effort normal and breath sounds normal to anterior ascultation GI: Soft.  No distension. There is no tenderness.  Skin: WDI  Neuro: Mental Status: Patient is awake, alert, she does not speak, but does communicate with left hand squeezes and eye blinks. When asked about her children and who they are with she answers stutteringly "sis-sis-sister" Cranial Nerves: II: Visual Fields are full to blink to threat. Pupils are equal, round, and reactive to light.   III,IV, VI: She initially has a left ward gaze preference, but then does cross when distracted.  V: Facial sensation is symmetric to temperature VII: Facial movement is notable for cortortion of her right lower face, pulling it towards the left with mout partially open.  VIII: hearing is intact to voice XII: tongue is midline without atrophy or fasciculations.  Motor: Tone is normal. Bulk is normal. 5/5 strength was present on the left. She had no movement in her right arm or leg, not even flicker, but when positioned  to fall on her face she does move it, and then after it hits the bed she repositions it some. She then again has no movement in it. Her leg likewise when positioned to fall on her other leg avoids the other leg.  Sensory: Sensation is decreased on the right.  Deep Tendon Reflexes: 2+ and symmetric in the biceps and patellae.  Plantars: Toes are downgoing bilaterally.  Cerebellar: Does not perform   I have reviewed labs in epic and the results pertinent to this consultation are: cmp-unremarkable  I have reviewed  the images obtained:MRI brain - negative  Impression: 44 yo F with right sided weakness and multiple findings suggestive of psychogenic etiology. She does also complain of photophobic retro-orbital pain which could be migraine and then this could represent complicated migraine with embellishment. My suspicion, however, is that this is conversion disorder.   Recommendations: 1) Phenergan 25mg  x1 as migraine could be considered.  2) I encouraged patient to seek psychological treatment regarding her feelings of stress with no one to talk to, however she was resistant to the idea.  3) no further neurodiagnostic testing is needed at this time.    Ritta SlotMcNeill Worth Kober, MD [facility] [phone]  If 7pm- 7am, please page neurology on call as listed in AMION.

## 2015-02-03 NOTE — ED Notes (Signed)
Pt arrives via [facility], became unresponsive in car with her children, per EMS.  Aphasic, right sided deficit.  .Marland Kitchen

## 2015-02-03 NOTE — Discharge Instructions (Signed)
Your labs, urine were normal today as well as her head CT and MRI. There was no stroke or signs of bleeding in your brain. This was likely secondary to a migraine headache and improved as your headache improved. Please follow up with your primary care physician as needed for migraine management.   Migraine Headache A migraine headache is an intense, throbbing pain on one or both sides of your head. A migraine can last for 30 minutes to several hours. CAUSES  The exact cause of a migraine headache is not always known. However, a migraine may be caused when nerves in the brain become irritated and release chemicals that cause inflammation. This causes pain. Certain things may also trigger migraines, such as:  Alcohol.  Smoking.  Stress.  Menstruation.  Aged cheeses.  Foods or drinks that contain nitrates, glutamate, aspartame, or tyramine.  Lack of sleep.  Chocolate.  Caffeine.  Hunger.  Physical exertion.  Fatigue.  Medicines used to treat chest pain (nitroglycerine), birth control pills, estrogen, and some blood pressure medicines. SIGNS AND SYMPTOMS  Pain on one or both sides of your head.  Pulsating or throbbing pain.  Severe pain that prevents daily activities.  Pain that is aggravated by any physical activity.  Nausea, vomiting, or both.  Dizziness.  Pain with exposure to bright lights, loud noises, or activity.  General sensitivity to bright lights, loud noises, or smells. Before you get a migraine, you may get warning signs that a migraine is coming (aura). An aura may include:  Seeing flashing lights.  Seeing bright spots, halos, or zigzag lines.  Having tunnel vision or blurred vision.  Having feelings of numbness or tingling.  Having trouble talking.  Having muscle weakness. DIAGNOSIS  A migraine headache is often diagnosed based on:  Symptoms.  Physical exam.  A CT scan or MRI of your head. These imaging tests cannot diagnose migraines,  but they can help rule out other causes of headaches. TREATMENT Medicines may be given for pain and nausea. Medicines can also be given to help prevent recurrent migraines.  HOME CARE INSTRUCTIONS  Only take over-the-counter or prescription medicines for pain or discomfort as directed by your health care provider. The use of long-term narcotics is not recommended.  Lie down in a dark, quiet room when you have a migraine.  Keep a journal to find out what may trigger your migraine headaches. For example, write down:  What you eat and drink.  How much sleep you get.  Any change to your diet or medicines.  Limit alcohol consumption.  Quit smoking if you smoke.  Get 7-9 hours of sleep, or as recommended by your health care provider.  Limit stress.  Keep lights dim if bright lights bother you and make your migraines worse. SEEK IMMEDIATE MEDICAL CARE IF:   Your migraine becomes severe.  You have a fever.  You have a stiff neck.  You have vision loss.  You have muscular weakness or loss of muscle control.  You start losing your balance or have trouble walking.  You feel faint or pass out.  You have severe symptoms that are different from your first symptoms. MAKE SURE YOU:   Understand these instructions.  Will watch your condition.  Will get help right away if you are not doing well or get worse. Document Released: 11/30/2005 Document Revised: 04/16/2014 Document Reviewed: 08/07/2013 [facility] Patient Information 2015 [place]. This information is not intended to replace advice given to you by your health care  provider. Make sure you discuss any questions you have with your health care provider. ° °

## 2015-02-03 NOTE — Progress Notes (Signed)
Code Stroke called on 44 y.o female LSN 0210 this am. While with children in fast food drive in pt developed right side weakness and inability to speak per EMS. PMH includes seizure disorder, lupus, pseudotumor, and migraines. [facility] presented with flaccid right extremities and forced left gaze unable to go past midline. Taken to CT scan STAT, negative per Neurologist. Initially a CTA was considered however after multiple attempts unable to place PIV. Pt able to move right arm, track to right side and speak some while in ED room. Pt taken to MRI stat. MRI negative per Neurologist. Once back in ED room NIHSS complete. Pt inconsistent with strength assessment on right extremities. Once right leg lifted , when falling it avoids the other leg several times, as well as right arm moves away from face when positioned to fall directly above. Sensory diminished on right upper and lower extremities and she displays some neglect to right leg. NIHSS score 16, however unsure if these results are psychogenic in nature. Code stroke canceled per Neurologist at 0423. Pt complaining of severe head ache behind right eye. When asked about stressors at home pt became very tearful and admits to lots of stress with no one to talk to. Phenergan given for migraine like pain behind eye. ED RN to continue to monitor closely.

## 2015-02-03 NOTE — ED Provider Notes (Signed)
This chart was scribed for Donna [name] Donna Tober, DO by Roxy Cedar, ED Scribe. This patient was seen in room D36C/D36C and the patient's care was started at 3:28 AM.   TIME SEEN: 3:28 AM   CHIEF COMPLAINT: Code Stroke  HPI: Donna [name] is a 44 y.o. female with a PMHx of lupus (based on + ANA) was on Plaquenil, conversion disorder versus complicated migraines per [facility] records, pseudotumor cerebri and has never had a documented stroke who presents to the emergency department with right sided weakness and aphasia at 2:10am while at drive through at cook out with children.  She also complains of a headache. Code stroke was called by EMS.  ROS:  Level V caveat for aphasia  PAST MEDICAL HISTORY/PAST SURGICAL HISTORY:  Past Medical History  Diagnosis Date  . Seizure disorder   . Lupus   . Lupus (systemic lupus erythematosus) 2007  . Lupus (systemic lupus erythematosus) 2007  . Endometriosis   . Seizures     last sz 06/2013, no meds  . DVT (deep venous thrombosis)     right arm  . Migraine     MEDICATIONS:  Prior to Admission medications   Medication Sig Start Date End Date Taking? Authorizing Provider  acetaminophen (TYLENOL) 500 MG tablet Take 500-1,000 mg by mouth every 6 (six) hours as needed for moderate pain or headache.     Historical Provider, MD  cholecalciferol (VITAMIN D) 1000 UNITS tablet Take 1,000 Units by mouth every morning.    Historical Provider, MD  cyanocobalamin 500 MCG tablet Take 500 mcg by mouth every morning.    Historical Provider, MD  gabapentin (NEURONTIN) 300 MG capsule Take 1-2 capsules (300-600 mg total) by mouth 3 (three) times daily. 02/20/14 02/20/15  Peggy Constant, MD  oxyCODONE-acetaminophen (PERCOCET) 5-325 MG per tablet Take 1-2 tablets by mouth every 6 (six) hours as needed for severe pain. 07/15/14   Lise Auer Piepenbrink, PA-C    ALLERGIES:  Allergies  Allergen Reactions  . Diamox [Acetazolamide] Anaphylaxis  . Nsaids Anaphylaxis  . Omnipaque  [Iohexol] Hives and Itching    Pt given Benadryl after reaction (at [facility]); IV Benadryl given prior to CT 03/27/2013; pt tolerated procedure without problems.  . Compazine [Prochlorperazine] Hives  . Lactose Intolerance (Gi) Nausea And Vomiting  . Reglan [Metoclopramide] Hives    SOCIAL HISTORY:  History  Substance Use Topics  . Smoking status: Never Smoker   . Smokeless tobacco: Never Used  . Alcohol Use: No    FAMILY HISTORY: Family History  Problem Relation Age of Onset  . Cancer Sister     breast & ovarian  . Cancer Maternal Aunt     breast & ovarian  . Birth defects Maternal Uncle     EXAM: BP 118/88 mmHg  Pulse 102  Temp(Src) 97.7 F (36.5 C) (Axillary)  Resp 14  SpO2 100%  LMP 06/14/2003 CONSTITUTIONAL: Alert but unable to answer questions, will follow commands, protecting airway HEAD: Normocephalic EYES: Conjunctivae clear, PERRL ENT: normal nose; no rhinorrhea; moist mucous membranes; pharynx without lesions noted NECK: Supple, no meningismus, no LAD  CARD: RRR; S1 and S2 appreciated; no murmurs, no clicks, no rubs, no gallops RESP: Normal chest excursion without splinting or tachypnea; breath sounds clear and equal bilaterally; no wheezes, no rhonchi, no rales,  ABD/GI: Normal bowel sounds; non-distended; soft, non-tender, no rebound, no guarding BACK:  The back appears normal and is non-tender to palpation, there is no CVA tenderness EXT: Normal ROM in all  joints; non-tender to palpation; no edema; normal capillary refill; no cyanosis    SKIN: Normal color for age and race; warm NEURO: Patient with right-sided deficits, aphasia PSYCH: The patient's mood and manner are appropriate. Grooming and personal hygiene are appropriate.  MEDICAL DECISION MAKING: Patient here after a code stroke was called. Last seen normal 2:10 AM. Dr. Amada JupiterKirkpatrick with neurology at bedside immediately. CT head unremarkable. Neurology has reviewed patient's records that do not show any  prior history of an actual stroke. She is thought to have complicated migraines versus conversion disorder.  [facility]  Labs unremarkable. Neurology plans to order an MRI while patient is still symptomatic and give IV Phenergan as she is allergic to Reglan, NSAIDs.  ED PROGRESS: Patient's MRI is completely unremarkable. Suspect this is a complicated migraine. She has received Phenergan with no relief. She is now able to talk but is still having focal weakness and is unable to ambulate. She reports that "Dilaudid always helps with my symptoms."  We'll give dose of Dilaudid in the emergency department and attempt to ambulate patient.   6:35 AM  Pt able to ambulate feeling much better after Dilaudid. Suspect complicated migraine versus conversion disorder. We'll discharge patient home. Discussed return precautions. She verbalizes understanding and is comfortable with plan.  Donna MawKristen N Caldwell Kronenberger, DO 02/03/15 [phone]

## 2015-04-14 ENCOUNTER — Emergency Department (HOSPITAL_COMMUNITY): Payer: Medicaid Other

## 2015-04-14 ENCOUNTER — Encounter (HOSPITAL_COMMUNITY): Payer: Self-pay | Admitting: Emergency Medicine

## 2015-04-14 ENCOUNTER — Emergency Department (HOSPITAL_COMMUNITY)
Admission: EM | Admit: 2015-04-14 | Discharge: 2015-04-14 | Disposition: A | Discharge status: Home / Self Care | Payer: Medicaid Other | Attending: Emergency Medicine | Admitting: Emergency Medicine

## 2015-04-14 DIAGNOSIS — Y998 Other external cause status: Secondary | ICD-10-CM | POA: Insufficient documentation

## 2015-04-14 DIAGNOSIS — R109 Unspecified abdominal pain: Secondary | ICD-10-CM | POA: Diagnosis present

## 2015-04-14 DIAGNOSIS — Z86718 Personal history of other venous thrombosis and embolism: Secondary | ICD-10-CM | POA: Diagnosis not present

## 2015-04-14 DIAGNOSIS — Y9389 Activity, other specified: Secondary | ICD-10-CM | POA: Diagnosis not present

## 2015-04-14 DIAGNOSIS — Y9289 Other specified places as the place of occurrence of the external cause: Secondary | ICD-10-CM | POA: Diagnosis not present

## 2015-04-14 DIAGNOSIS — T782XXA Anaphylactic shock, unspecified, initial encounter: Secondary | ICD-10-CM | POA: Insufficient documentation

## 2015-04-14 DIAGNOSIS — Z8679 Personal history of other diseases of the circulatory system: Secondary | ICD-10-CM | POA: Diagnosis not present

## 2015-04-14 DIAGNOSIS — X58XXXA Exposure to other specified factors, initial encounter: Secondary | ICD-10-CM | POA: Insufficient documentation

## 2015-04-14 DIAGNOSIS — Z8742 Personal history of other diseases of the female genital tract: Secondary | ICD-10-CM | POA: Insufficient documentation

## 2015-04-14 DIAGNOSIS — Z8669 Personal history of other diseases of the nervous system and sense organs: Secondary | ICD-10-CM | POA: Insufficient documentation

## 2015-04-14 DIAGNOSIS — Z79899 Other long term (current) drug therapy: Secondary | ICD-10-CM | POA: Diagnosis not present

## 2015-04-14 DIAGNOSIS — Z8739 Personal history of other diseases of the musculoskeletal system and connective tissue: Secondary | ICD-10-CM | POA: Diagnosis not present

## 2015-04-14 DIAGNOSIS — R1031 Right lower quadrant pain: Secondary | ICD-10-CM | POA: Insufficient documentation

## 2015-04-14 LAB — CBC WITH DIFFERENTIAL/PLATELET
BASOS PCT: 0 % (ref 0–1)
Basophils Absolute: 0 10*3/uL (ref 0.0–0.1)
EOS ABS: 0.1 10*3/uL (ref 0.0–0.7)
EOS PCT: 1 % (ref 0–5)
HCT: 37.2 % (ref 36.0–46.0)
Hemoglobin: 11.9 g/dL — ABNORMAL LOW (ref 12.0–15.0)
LYMPHS ABS: 3 10*3/uL (ref 0.7–4.0)
Lymphocytes Relative: 39 % (ref 12–46)
MCH: 27.3 pg (ref 26.0–34.0)
MCHC: 32 g/dL (ref 30.0–36.0)
MCV: 85.3 fL (ref 78.0–100.0)
Monocytes Absolute: 0.5 10*3/uL (ref 0.1–1.0)
Monocytes Relative: 7 % (ref 3–12)
NEUTROS PCT: 53 % (ref 43–77)
Neutro Abs: 4.1 10*3/uL (ref 1.7–7.7)
PLATELETS: 371 10*3/uL (ref 150–400)
RBC: 4.36 MIL/uL (ref 3.87–5.11)
RDW: 13 % (ref 11.5–15.5)
WBC: 7.8 10*3/uL (ref 4.0–10.5)

## 2015-04-14 LAB — URINE MICROSCOPIC-ADD ON

## 2015-04-14 LAB — COMPREHENSIVE METABOLIC PANEL
ALK PHOS: 95 U/L (ref 38–126)
ALT: 16 U/L (ref 14–54)
AST: 20 U/L (ref 15–41)
Albumin: 4.6 g/dL (ref 3.5–5.0)
Anion gap: 9 (ref 5–15)
BILIRUBIN TOTAL: 0.6 mg/dL (ref 0.3–1.2)
BUN: 16 mg/dL (ref 6–20)
CO2: 24 mmol/L (ref 22–32)
Calcium: 9.4 mg/dL (ref 8.9–10.3)
Chloride: 104 mmol/L (ref 101–111)
Creatinine, Ser: 0.78 mg/dL (ref 0.44–1.00)
GLUCOSE: 105 mg/dL — AB (ref 70–99)
Potassium: 3.8 mmol/L (ref 3.5–5.1)
Sodium: 137 mmol/L (ref 135–145)
Total Protein: 7.7 g/dL (ref 6.5–8.1)

## 2015-04-14 LAB — URINALYSIS, ROUTINE W REFLEX MICROSCOPIC
Bilirubin Urine: NEGATIVE
GLUCOSE, UA: NEGATIVE mg/dL
Hgb urine dipstick: NEGATIVE
KETONES UR: NEGATIVE mg/dL
Nitrite: NEGATIVE
PH: 6 (ref 5.0–8.0)
PROTEIN: NEGATIVE mg/dL
Specific Gravity, Urine: 1.026 (ref 1.005–1.030)
Urobilinogen, UA: 0.2 mg/dL (ref 0.0–1.0)

## 2015-04-14 LAB — LIPASE, BLOOD: LIPASE: 22 U/L (ref 22–51)

## 2015-04-14 MED ORDER — DIPHENHYDRAMINE HCL 50 MG/ML IJ SOLN
INTRAMUSCULAR | Status: AC
  Filled 2015-04-14: qty 1

## 2015-04-14 MED ORDER — ONDANSETRON HCL 4 MG/2ML IJ SOLN
4.0000 mg | Freq: Once | INTRAMUSCULAR | Status: AC
  Administered 2015-04-14: 4 mg via INTRAVENOUS
  Filled 2015-04-14: qty 2

## 2015-04-14 MED ORDER — HYDROMORPHONE HCL 1 MG/ML IJ SOLN
INTRAMUSCULAR | Status: AC
  Filled 2015-04-14: qty 1

## 2015-04-14 MED ORDER — HYDROMORPHONE HCL 1 MG/ML IJ SOLN
1.0000 mg | Freq: Once | INTRAMUSCULAR | Status: AC
  Administered 2015-04-14: 1 mg via INTRAVENOUS
  Filled 2015-04-14: qty 1

## 2015-04-14 MED ORDER — ALBUTEROL SULFATE (2.5 MG/3ML) 0.083% IN NEBU
INHALATION_SOLUTION | RESPIRATORY_TRACT | Status: AC
  Administered 2015-04-14: 5 mg
  Filled 2015-04-14: qty 3

## 2015-04-14 MED ORDER — METHYLPREDNISOLONE SODIUM SUCC 125 MG IJ SOLR
125.0000 mg | Freq: Once | INTRAMUSCULAR | Status: AC
  Administered 2015-04-14: 125 mg via INTRAVENOUS

## 2015-04-14 MED ORDER — FENTANYL CITRATE (PF) 100 MCG/2ML IJ SOLN
50.0000 ug | Freq: Once | INTRAMUSCULAR | Status: AC
  Administered 2015-04-14: 50 ug via NASAL

## 2015-04-14 MED ORDER — PREDNISONE 20 MG PO TABS: 60.0000 mg | ORAL_TABLET | Freq: Every day | ORAL | Status: DC

## 2015-04-14 MED ORDER — EPINEPHRINE 0.3 MG/0.3ML IJ SOAJ
0.3000 mg | Freq: Once | INTRAMUSCULAR | Status: AC
  Administered 2015-04-14: 0.3 mg via INTRAMUSCULAR
  Filled 2015-04-14: qty 0.3

## 2015-04-14 MED ORDER — ALBUTEROL (5 MG/ML) CONTINUOUS INHALATION SOLN
10.0000 mg/h | INHALATION_SOLUTION | RESPIRATORY_TRACT | Status: DC
  Administered 2015-04-14: 10 mg/h via RESPIRATORY_TRACT
  Filled 2015-04-14: qty 20

## 2015-04-14 MED ORDER — DIPHENHYDRAMINE HCL 50 MG/ML IJ SOLN
25.0000 mg | Freq: Once | INTRAMUSCULAR | Status: AC
  Administered 2015-04-14: 25 mg via INTRAVENOUS

## 2015-04-14 MED ORDER — FENTANYL CITRATE (PF) 100 MCG/2ML IJ SOLN
50.0000 ug | Freq: Once | INTRAMUSCULAR | Status: DC
  Filled 2015-04-14: qty 2

## 2015-04-14 MED ORDER — FAMOTIDINE IN NACL 20-0.9 MG/50ML-% IV SOLN
20.0000 mg | Freq: Once | INTRAVENOUS | Status: AC
  Administered 2015-04-14: 20 mg via INTRAVENOUS
  Filled 2015-04-14: qty 50

## 2015-04-14 MED ORDER — DIPHENHYDRAMINE HCL 50 MG/ML IJ SOLN
25.0000 mg | Freq: Once | INTRAMUSCULAR | Status: AC
  Administered 2015-04-14: 04:00:00 via INTRAVENOUS
  Filled 2015-04-14: qty 1

## 2015-04-14 MED ORDER — METHYLPREDNISOLONE SODIUM SUCC 125 MG IJ SOLR
INTRAMUSCULAR | Status: AC
  Filled 2015-04-14: qty 2

## 2015-04-14 MED ORDER — IOHEXOL 300 MG/ML  SOLN
50.0000 mL | Freq: Once | INTRAMUSCULAR | Status: AC | PRN
Start: 2015-04-14 — End: 2015-04-14
  Administered 2015-04-14: 50 mL via ORAL

## 2015-04-14 MED ORDER — EPINEPHRINE HCL 1 MG/ML IJ SOLN: 1.0000 mg | Freq: Once | INTRAMUSCULAR | Status: DC

## 2015-04-14 MED ORDER — IOHEXOL 300 MG/ML  SOLN
100.0000 mL | Freq: Once | INTRAMUSCULAR | Status: AC | PRN
  Administered 2015-04-14: 100 mL via INTRAVENOUS

## 2015-04-14 MED ORDER — HYDROMORPHONE HCL 1 MG/ML IJ SOLN
1.0000 mg | Freq: Once | INTRAMUSCULAR | Status: AC
  Administered 2015-04-14: 1 mg via INTRAVENOUS

## 2015-04-14 MED ORDER — DIPHENHYDRAMINE HCL 25 MG PO TABS: 50.0000 mg | ORAL_TABLET | ORAL | Status: DC | PRN

## 2015-04-14 NOTE — ED Provider Notes (Signed)
CSN: [id number]     Arrival date & time 04/14/15  0121 History   First MD Initiated Contact with Patient 04/14/15 0208     Chief Complaint  Patient presents with  . Flank Pain     (Consider location/radiation/quality/duration/timing/severity/associated sxs/prior Treatment) Patient is a 44 y.o. female presenting with abdominal pain.  Abdominal Pain Pain location:  R flank Pain quality: sharp   Pain radiates to:  Does not radiate Pain severity:  Severe Onset quality:  Gradual Duration:  2 days Timing:  Constant Progression:  Worsening Chronicity:  New Context comment:  Lupus, prior hydrosalpix of remaining R ovary, states pain is different Relieved by:  Nothing Worsened by:  Movement and palpation Ineffective treatments:  None tried Associated symptoms: chills and nausea   Associated symptoms: no anorexia, no constipation, no diarrhea, no dysuria, no fever and no vomiting     Past Medical History  Diagnosis Date  . Seizure disorder   . Lupus   . Lupus (systemic lupus erythematosus) 2007  . Lupus (systemic lupus erythematosus) 2007  . Endometriosis   . Seizures     last sz 06/2013, no meds  . DVT (deep venous thrombosis)     right arm  . Migraine    Past Surgical History  Procedure Laterality Date  . Abdominal hysterectomy      x2 partial  . Cesarean section      x3  . Mass excision  02/2014    abd   Family History  Problem Relation Age of Onset  . Cancer Sister     breast & ovarian  . Cancer Maternal Aunt     breast & ovarian  . Birth defects Maternal Uncle    History  Substance Use Topics  . Smoking status: Never Smoker   . Smokeless tobacco: Never Used  . Alcohol Use: No   OB History    Gravida Para Term Preterm AB TAB SAB Ectopic Multiple Living   3 3 2 1      3      Review of Systems  Constitutional: Positive for chills. Negative for fever.  Gastrointestinal: Positive for nausea and abdominal pain. Negative for vomiting, diarrhea, constipation and  anorexia.  Genitourinary: Negative for dysuria.  All other systems reviewed and are negative.     Allergies  Diamox; Nsaids; Omnipaque; Compazine; Lactose intolerance (gi); and Reglan  Home Medications   Prior to Admission medications   Medication Sig Start Date End Date Taking? Authorizing Provider  acetaminophen (TYLENOL) 500 MG tablet Take 500-1,000 mg by mouth every 6 (six) hours as needed for moderate pain or headache.    Yes Historical Provider, MD  cholecalciferol (VITAMIN D) 1000 UNITS tablet Take 1,000 Units by mouth every morning.   Yes Historical Provider, MD  cyanocobalamin 500 MCG tablet Take 500 mcg by mouth every morning.   Yes Historical Provider, MD  EPINEPHrine 0.3 mg/0.3 mL IJ SOAJ injection Inject 0.3 mg into the muscle daily as needed (allergic reaction).   Yes Historical Provider, MD  Melatonin 3 MG TABS Take 3 mg by mouth at bedtime as needed (for sleep).   Yes Historical Provider, MD  diphenhydrAMINE (BENADRYL) 25 MG tablet Take 2 tablets (50 mg total) by mouth every 4 (four) hours as needed for itching. 04/14/15   Pricilla LovelessScott Goldston, MD  oxyCODONE-acetaminophen (PERCOCET) 5-325 MG per tablet Take 1-2 tablets by mouth every 6 (six) hours as needed for severe pain. Patient not taking: Reported on 02/03/2015 07/15/14   Francee PiccoloJennifer Piepenbrink,  PA-C  predniSONE (DELTASONE) 20 MG tablet Take 3 tablets (60 mg total) by mouth daily. 3 tabs po day one, then 2 po daily x 4 days 04/14/15   Pricilla Loveless, MD   BP 128/105 mmHg  Pulse 107  Temp(Src) 97.6 F (36.4 C) (Oral)  Resp 12  Ht  (1.6 m)  Wt 155 lb (70.308 kg)  BMI 27.46 kg/m2  SpO2 97%  LMP 06/14/2003 Physical Exam  Constitutional: She is oriented to person, place, and time. She appears well-developed and well-nourished.  HENT:  Head: Normocephalic and atraumatic.  Right Ear: External ear normal.  Left Ear: External ear normal.  Eyes: Conjunctivae and EOM are normal. Pupils are equal, round, and reactive to light.   Neck: Normal range of motion. Neck supple.  Cardiovascular: Normal rate, regular rhythm, normal heart sounds and intact distal pulses.   Pulmonary/Chest: Effort normal and breath sounds normal.  Abdominal: Soft. Bowel sounds are normal. There is tenderness in the right upper quadrant and right lower quadrant.  Musculoskeletal: Normal range of motion.  Neurological: She is alert and oriented to person, place, and time.  Skin: Skin is warm and dry.  Vitals reviewed.   ED Course  Procedures (including critical care time) Labs Review Labs Reviewed  CBC WITH DIFFERENTIAL/PLATELET - Abnormal; Notable for the following:    Hemoglobin 11.9 (*)    All other components within normal limits  COMPREHENSIVE METABOLIC PANEL - Abnormal; Notable for the following:    Glucose, Bld 105 (*)    All other components within normal limits  URINALYSIS, ROUTINE W REFLEX MICROSCOPIC - Abnormal; Notable for the following:    APPearance CLOUDY (*)    Leukocytes, UA SMALL (*)    All other components within normal limits  URINE MICROSCOPIC-ADD ON - Abnormal; Notable for the following:    Squamous Epithelial / LPF FEW (*)    All other components within normal limits  LIPASE, BLOOD    Imaging Review Ct Abdomen Pelvis W Contrast  04/14/2015   CLINICAL DATA:  RIGHT lower quadrant pain. History of lupus and endometriosis.  EXAM: CT ABDOMEN AND PELVIS WITH CONTRAST  TECHNIQUE: Multidetector CT imaging of the abdomen and pelvis was performed using the standard protocol following bolus administration of intravenous contrast.  CONTRAST:  50mL OMNIPAQUE IOHEXOL 300 MG/ML SOLN, OMNIPAQUE IOHEXOL 300 MG/ML SOLN  COMPARISON:  CT of the abdomen and pelvis October 04, 2013  FINDINGS: LUNG BASES: Included view of the lung bases are clear. Visualized heart and pericardium are unremarkable.  SOLID ORGANS: The liver is diffusely mildly hypodense consistent with steatosis, and otherwise unremarkable. Spleen, gallbladder,  pancreas and adrenal glands are unremarkable.  GASTROINTESTINAL TRACT: The stomach, small and large bowel are normal in course and caliber without inflammatory changes. Normal appendix. Mild amount of retained large bowel stool.  KIDNEYS/ URINARY TRACT: Kidneys are orthotopic, demonstrating symmetric enhancement. No nephrolithiasis, hydronephrosis or solid renal masses ; 19 mm low-density cyst LEFT interpolar kidney. Too small to characterize hypodensities in the kidneys bilaterally. The unopacified ureters are normal in course and caliber. Urinary bladder is partially distended and unremarkable.  PERITONEUM/RETROPERITONEUM: Aortoiliac vessels are normal in course and caliber. No lymphadenopathy by CT size criteria. Status post hysterectomy. No intraperitoneal free fluid nor free air. Phleboliths in the pelvis. Pelvic tubular structures seen on prior CT is no longer present.  SOFT TISSUE/OSSEOUS STRUCTURES: Non-suspicious. Anterior abdominal wall scarring.  IMPRESSION: No acute intra-abdominal or pelvic process.  Normal appendix.   Electronically Signed  By: Awilda Metro   On: 04/14/2015 05:27     EKG Interpretation None      MDM   Final diagnoses:  RLQ abdominal pain  Anaphylaxis, initial encounter    44 y.o. female with pertinent PMH of lupus, R hydrosalpix presents with R flank pain.  Pain constant, no ho nephrolithiasis, and no fevers.  On arrival vitals and physical exam as above.  CT scan ordered, believe contrast necessary despite prior itching with contrast.  CT scan obtained and unremarkable, however pt unfortunately developed itching, wheezing.  Attempted benadryl 50 mg IV and albuterol with some relief, but pt began to complain of foreign body sensation in throat, so with an abundance of caution administered epipen.  Symptoms relieved.  Pt care to Dr. Criss Alvine pending anaphylaxis reeval.    I have reviewed all laboratory and imaging studies if ordered as above  1. Anaphylaxis,  initial encounter   2. RLQ abdominal pain         Mirian Mo, MD 04/15/15 [phone]

## 2015-04-14 NOTE — ED Provider Notes (Signed)
Patient continues to have some tachycardia but no recurrence of anaphylaxis symptoms. HR below 110 while I was in the room. No chest pain. Lungs clear on my exam. Has been observed about 3 hours since epi given. Will d/c with steroid burst, benadryl prn. Already has an epi pen in her purse.  Pricilla LovelessScott Laurice Iglesia, MD 04/14/15 [phone]

## 2015-04-14 NOTE — ED Notes (Signed)
Pt arrived to the Ed with a complaint of right sided flank and abdominal pain.  Pt states pain is sharp.  Pain radiates to the right lower back.  Pt has had symptoms since yesterday.  Pt states she has had only one episode of urination in the last 24 hours and it was difficult as it felt as if she had a lot of pressure.

## 2015-04-14 NOTE — ED Notes (Signed)
Pt aware of need for urine sample. Pt unable to void at this time.  

## 2015-04-14 NOTE — ED Notes (Signed)
MD Littie DeedsGentry made aware of patient

## 2015-04-14 NOTE — ED Notes (Signed)
Patient returns from CT with redness and itchy skin. MD Littie DeedsGentry made aware.

## 2015-04-14 NOTE — ED Notes (Signed)
Patient continues to have insp/exp wheezing bilaterally s/p completion of neb tx, see MAR EDP made aware

## 2015-04-14 NOTE — ED Notes (Signed)
Bed: WA03 Expected date:  Expected time:  Means of arrival:  Comments: 

## 2015-04-14 NOTE — ED Notes (Signed)
Patient brought to Res A s/p allergic reaction  Audible insp/exp wheezing noted upon arrival to Res A Neb tx and Solumedrol given, see MAR Patient placed on cardiac monitor Dr. Littie DeedsGentry at bedside

## 2015-04-14 NOTE — ED Notes (Signed)
One unsuccessful attempt at IV insertion. Second RN to attempt

## 2015-04-14 NOTE — Progress Notes (Signed)
Pt in respiratory distress, wheezes noted audibly and throughout all lung fields.  CAT started per MD order.  Pt unable to do peak flow at this time.

## 2015-12-15 ENCOUNTER — Emergency Department (HOSPITAL_COMMUNITY)
Admission: EM | Admit: 2015-12-15 | Discharge: 2015-12-15 | Disposition: A | Discharge status: Home / Self Care | Payer: Self-pay | Attending: Emergency Medicine | Admitting: Emergency Medicine

## 2015-12-15 ENCOUNTER — Encounter (HOSPITAL_COMMUNITY): Payer: Self-pay

## 2015-12-15 ENCOUNTER — Emergency Department (HOSPITAL_COMMUNITY): Payer: Medicaid Other

## 2015-12-15 DIAGNOSIS — Z8669 Personal history of other diseases of the nervous system and sense organs: Secondary | ICD-10-CM | POA: Insufficient documentation

## 2015-12-15 DIAGNOSIS — M329 Systemic lupus erythematosus, unspecified: Secondary | ICD-10-CM | POA: Insufficient documentation

## 2015-12-15 DIAGNOSIS — M25551 Pain in right hip: Secondary | ICD-10-CM | POA: Insufficient documentation

## 2015-12-15 DIAGNOSIS — Z8679 Personal history of other diseases of the circulatory system: Secondary | ICD-10-CM | POA: Insufficient documentation

## 2015-12-15 DIAGNOSIS — Z86718 Personal history of other venous thrombosis and embolism: Secondary | ICD-10-CM | POA: Insufficient documentation

## 2015-12-15 DIAGNOSIS — Z3202 Encounter for pregnancy test, result negative: Secondary | ICD-10-CM | POA: Insufficient documentation

## 2015-12-15 DIAGNOSIS — Z8742 Personal history of other diseases of the female genital tract: Secondary | ICD-10-CM | POA: Insufficient documentation

## 2015-12-15 LAB — I-STAT CHEM 8, ED
BUN: 17 mg/dL (ref 6–20)
CHLORIDE: 104 mmol/L (ref 101–111)
Calcium, Ion: 1.15 mmol/L (ref 1.12–1.23)
Creatinine, Ser: 0.7 mg/dL (ref 0.44–1.00)
Glucose, Bld: 129 mg/dL — ABNORMAL HIGH (ref 65–99)
HCT: 40 % (ref 36.0–46.0)
Hemoglobin: 13.6 g/dL (ref 12.0–15.0)
Potassium: 3.6 mmol/L (ref 3.5–5.1)
SODIUM: 140 mmol/L (ref 135–145)
TCO2: 25 mmol/L (ref 0–100)

## 2015-12-15 LAB — CBC WITH DIFFERENTIAL/PLATELET
BASOS PCT: 0 %
Basophils Absolute: 0 10*3/uL (ref 0.0–0.1)
Eosinophils Absolute: 0.1 10*3/uL (ref 0.0–0.7)
Eosinophils Relative: 2 %
HCT: 35.6 % — ABNORMAL LOW (ref 36.0–46.0)
HEMOGLOBIN: 11.3 g/dL — AB (ref 12.0–15.0)
Lymphocytes Relative: 32 %
Lymphs Abs: 2.2 10*3/uL (ref 0.7–4.0)
MCH: 27 pg (ref 26.0–34.0)
MCHC: 31.7 g/dL (ref 30.0–36.0)
MCV: 85.2 fL (ref 78.0–100.0)
Monocytes Absolute: 0.4 10*3/uL (ref 0.1–1.0)
Monocytes Relative: 6 %
NEUTROS PCT: 60 %
Neutro Abs: 4.2 10*3/uL (ref 1.7–7.7)
Platelets: 332 10*3/uL (ref 150–400)
RBC: 4.18 MIL/uL (ref 3.87–5.11)
RDW: 14.2 % (ref 11.5–15.5)
WBC: 6.9 10*3/uL (ref 4.0–10.5)

## 2015-12-15 LAB — I-STAT BETA HCG BLOOD, ED (MC, WL, AP ONLY): I-stat hCG, quantitative: <5 m[IU]/mL (ref <5)

## 2015-12-15 MED ORDER — METHOCARBAMOL 1000 MG/10ML IJ SOLN: 1000.0000 mg | Freq: Once | INTRAMUSCULAR | Status: DC

## 2015-12-15 MED ORDER — FENTANYL CITRATE (PF) 100 MCG/2ML IJ SOLN
100.0000 ug | Freq: Once | INTRAMUSCULAR | Status: AC
  Administered 2015-12-15: 100 ug via INTRAVENOUS
  Filled 2015-12-15: qty 2

## 2015-12-15 MED ORDER — LIDOCAINE 5 % EX PTCH: 1.0000 | MEDICATED_PATCH | CUTANEOUS | Status: DC

## 2015-12-15 MED ORDER — PREDNISONE 20 MG PO TABS
60.0000 mg | ORAL_TABLET | Freq: Once | ORAL | Status: AC
  Administered 2015-12-15: 60 mg via ORAL
  Filled 2015-12-15: qty 3

## 2015-12-15 MED ORDER — OXYCODONE-ACETAMINOPHEN 5-325 MG PO TABS: 1.0000 | ORAL_TABLET | Freq: Four times a day (QID) | ORAL | Status: DC | PRN

## 2015-12-15 MED ORDER — METHOCARBAMOL 1000 MG/10ML IJ SOLN
1000.0000 mg | Freq: Once | INTRAVENOUS | Status: AC
  Administered 2015-12-15: 1000 mg via INTRAVENOUS
  Filled 2015-12-15: qty 10

## 2015-12-15 MED ORDER — ONDANSETRON HCL 4 MG/2ML IJ SOLN
4.0000 mg | Freq: Once | INTRAMUSCULAR | Status: AC
  Administered 2015-12-15: 4 mg via INTRAVENOUS
  Filled 2015-12-15: qty 2

## 2015-12-15 MED ORDER — LIDOCAINE 5 % EX PTCH
1.0000 | MEDICATED_PATCH | CUTANEOUS | Status: DC
  Administered 2015-12-15: 1 via TRANSDERMAL
  Filled 2015-12-15 (×2): qty 1

## 2015-12-15 MED ORDER — PREDNISONE 20 MG PO TABS: ORAL_TABLET | ORAL | Status: DC

## 2015-12-15 MED ORDER — OXYCODONE-ACETAMINOPHEN 5-325 MG PO TABS
2.0000 | ORAL_TABLET | Freq: Once | ORAL | Status: AC
  Administered 2015-12-15: 2 via ORAL
  Filled 2015-12-15: qty 2

## 2015-12-15 MED ORDER — SODIUM CHLORIDE 0.9 % IV BOLUS (SEPSIS)
1000.0000 mL | Freq: Once | INTRAVENOUS | Status: AC
  Administered 2015-12-15: 1000 mL via INTRAVENOUS

## 2015-12-15 NOTE — ED Notes (Addendum)
RN unable to get blood work from IV start. Phlebotomy called to for labwork.

## 2015-12-15 NOTE — Discharge Instructions (Signed)
Heat Therapy  Heat therapy can help ease sore, stiff, injured, and tight muscles and joints. Heat relaxes your muscles, which may help ease your pain.   RISKS AND COMPLICATIONS  If you have any of the following conditions, do not use heat therapy unless your health care provider has approved:   Poor circulation.   Healing wounds or scarred skin in the area being treated.   Diabetes, heart disease, or high blood pressure.   Not being able to feel (numbness) the area being treated.   Unusual swelling of the area being treated.   Active infections.   Blood clots.   Cancer.   Inability to communicate pain. This may include young children and people who have problems with their brain function (dementia).   Pregnancy.  Heat therapy should only be used on old, pre-existing, or long-lasting (chronic) injuries. Do not use heat therapy on new injuries unless directed by your health care provider.  HOW TO USE HEAT THERAPY  There are several different kinds of heat therapy, including:   Moist heat pack.   Warm water bath.   Hot water bottle.   Electric heating pad.   Heated gel pack.   Heated wrap.   Electric heating pad.  Use the heat therapy method suggested by your health care provider. Follow your health care provider's instructions on when and how to use heat therapy.  GENERAL HEAT THERAPY RECOMMENDATIONS   Do not sleep while using heat therapy. Only use heat therapy while you are awake.   Your skin may turn pink while using heat therapy. Do not use heat therapy if your skin turns red.   Do not use heat therapy if you have new pain.   High heat or long exposure to heat can cause burns. Be careful when using heat therapy to avoid burning your skin.   Do not use heat therapy on areas of your skin that are already irritated, such as with a rash or sunburn.  SEEK MEDICAL CARE IF:   You have blisters, redness, swelling, or numbness.   You have new pain.   Your pain is worse.  MAKE SURE  YOU:   Understand these instructions.   Will watch your condition.   Will get help right away if you are not doing well or get worse.     This information is not intended to replace advice given to you by your health care provider. Make sure you discuss any questions you have with your health care provider.     Document Released: 02/22/2012 Document Revised: 12/21/2014 Document Reviewed: 01/23/2014  Elsevier Interactive Patient Education 2016 [employer]

## 2015-12-15 NOTE — ED Notes (Signed)
Pt started to feel bad two days ago but tonight symptoms became worse, pt is shaking and is in pain

## 2015-12-15 NOTE — ED Provider Notes (Signed)
CSN: [id number]     Arrival date & time 12/15/15  0232 History   First MD Initiated Contact with Patient 12/15/15 [phone]     Chief Complaint  Patient presents with  . Lupus     (Consider location/radiation/quality/duration/timing/severity/associated sxs/prior Treatment) Patient is a 45 y.o. female presenting with hip pain. The history is provided by the patient.  Hip Pain This is a new problem. The current episode started more than 2 days ago. The problem occurs constantly. The problem has not changed since onset.Pertinent negatives include no chest pain, no abdominal pain, no headaches and no shortness of breath. Nothing aggravates the symptoms. Nothing relieves the symptoms. She has tried nothing for the symptoms. The treatment provided no relief.  States her hip has been dislocated for 3 days because of her lupus.  Has been walking on it.  No f/c/r.  No rashes.  No calf pain.  No trauma.  No weakness nor numbness  Past Medical History  Diagnosis Date  . Seizure disorder (HCC)   . Lupus (HCC)   . Lupus (systemic lupus erythematosus) (HCC) 2007  . Lupus (systemic lupus erythematosus) (HCC) 2007  . Endometriosis   . Seizures (HCC)     last sz 06/2013, no meds  . DVT (deep venous thrombosis) (HCC)     right arm  . Migraine    Past Surgical History  Procedure Laterality Date  . Abdominal hysterectomy      x2 partial  . Cesarean section      x3  . Mass excision  02/2014    abd   Family History  Problem Relation Age of Onset  . Cancer Sister     breast & ovarian  . Cancer Maternal Aunt     breast & ovarian  . Birth defects Maternal Uncle    Social History  Substance Use Topics  . Smoking status: Never Smoker   . Smokeless tobacco: Never Used  . Alcohol Use: No   OB History    Gravida Para Term Preterm AB TAB SAB Ectopic Multiple Living   3 3 2 1      3      Review of Systems  Respiratory: Negative for shortness of breath.   Cardiovascular: Negative for chest pain.   Gastrointestinal: Negative for abdominal pain.  Musculoskeletal: Negative for joint swelling.  Neurological: Negative for headaches.  All other systems reviewed and are negative.     Allergies  Diamox; Nsaids; Omnipaque; Compazine; Lactose intolerance (gi); and Reglan  Home Medications   Prior to Admission medications   Medication Sig Start Date End Date Taking? Authorizing Provider  acetaminophen (TYLENOL) 500 MG tablet Take 500-1,000 mg by mouth every 6 (six) hours as needed for moderate pain or headache.    Yes Historical Provider, MD  EPINEPHrine 0.3 mg/0.3 mL IJ SOAJ injection Inject 0.3 mg into the muscle daily as needed (allergic reaction).   Yes Historical Provider, MD   BP 162/137 mmHg  Pulse 121  Temp(Src) 98.1 F (36.7 C) (Oral)  Resp 20  SpO2 100%  LMP 06/14/2003 Physical Exam  Constitutional: She is oriented to person, place, and time. She appears well-developed and well-nourished. No distress.  HENT:  Head: Normocephalic and atraumatic.  Mouth/Throat: Oropharynx is clear and moist.  Eyes: Conjunctivae are normal. Pupils are equal, round, and reactive to light.  Neck: Normal range of motion. Neck supple.  Cardiovascular: Normal rate, regular rhythm and intact distal pulses.   Pulmonary/Chest: Effort normal and breath sounds normal. No  respiratory distress. She has no wheezes. She has no rales.  Abdominal: Soft. Bowel sounds are normal. There is no tenderness. There is no rebound and no guarding.  Musculoskeletal: Normal range of motion.       Right hip: She exhibits normal range of motion, normal strength, no tenderness, no bony tenderness, no swelling, no crepitus, no deformity and no laceration.       Right knee: Normal.       Right lower leg: Normal. She exhibits no tenderness, no bony tenderness, no swelling and no edema.  5/5 BLE strength   Neurological: She is alert and oriented to person, place, and time.  Skin: Skin is warm and dry.  Psychiatric: She  has a normal mood and affect.    ED Course  Procedures (including critical care time) Labs Review Labs Reviewed  CBC WITH DIFFERENTIAL/PLATELET - Abnormal; Notable for the following:    Hemoglobin 11.3 (*)    HCT 35.6 (*)    All other components within normal limits  I-STAT CHEM 8, ED - Abnormal; Notable for the following:    Glucose, Bld 129 (*)    All other components within normal limits  URINE RAPID DRUG SCREEN, HOSP PERFORMED  URINALYSIS, ROUTINE W REFLEX MICROSCOPIC (NOT AT [facility])  I-STAT BETA HCG BLOOD, ED ([facility], [facility], [facility] ONLY)    Imaging Review Dg Hip Unilat With Pelvis 2-3 Views Right  12/15/2015  CLINICAL DATA:  Acute onset of right hip pain.  Initial encounter. EXAM: DG HIP (WITH OR WITHOUT PELVIS) 2-3V RIGHT COMPARISON:  None. FINDINGS: There is no evidence of fracture or dislocation. Both femoral heads are seated normally within their respective acetabula. The proximal right femur appears intact. No significant degenerative change is appreciated. The sacroiliac joints are unremarkable in appearance. The visualized bowel gas pattern is grossly unremarkable in appearance. IMPRESSION: No evidence of fracture or dislocation. Electronically Signed   By: Roanna Raider M.D.   On: 12/15/2015 06:42   I have personally reviewed and evaluated these images and lab results as part of my medical decision-making.   EKG Interpretation None      MDM   Final diagnoses:  None  No signs of infection on labs work.  No fractures or dislocations.  Joint is structurally healthy on xray.    Resting comfortably post medication.  Will start a short course of steroids, percocet and lidoderm patches.  Follow up with your Rheumatologist at [facility] on Monday.  Patient verbalizes understanding and agrees to follow up    Itamar Mcgowan, MD 12/15/15 1610

## 2016-04-21 ENCOUNTER — Encounter (HOSPITAL_COMMUNITY): Payer: Self-pay | Admitting: Emergency Medicine

## 2016-04-21 ENCOUNTER — Emergency Department (HOSPITAL_COMMUNITY)
Admission: EM | Admit: 2016-04-21 | Discharge: 2016-04-21 | Disposition: A | Discharge status: Home / Self Care | Payer: Medicaid Other | Attending: Emergency Medicine | Admitting: Emergency Medicine

## 2016-04-21 DIAGNOSIS — Z8679 Personal history of other diseases of the circulatory system: Secondary | ICD-10-CM | POA: Insufficient documentation

## 2016-04-21 DIAGNOSIS — Z86718 Personal history of other venous thrombosis and embolism: Secondary | ICD-10-CM | POA: Insufficient documentation

## 2016-04-21 DIAGNOSIS — M329 Systemic lupus erythematosus, unspecified: Secondary | ICD-10-CM | POA: Insufficient documentation

## 2016-04-21 DIAGNOSIS — Z8669 Personal history of other diseases of the nervous system and sense organs: Secondary | ICD-10-CM | POA: Insufficient documentation

## 2016-04-21 DIAGNOSIS — Z8742 Personal history of other diseases of the female genital tract: Secondary | ICD-10-CM | POA: Insufficient documentation

## 2016-04-21 DIAGNOSIS — Z79899 Other long term (current) drug therapy: Secondary | ICD-10-CM | POA: Insufficient documentation

## 2016-04-21 MED ORDER — OXYCODONE-ACETAMINOPHEN 5-325 MG PO TABS
2.0000 | ORAL_TABLET | Freq: Once | ORAL | Status: AC
  Administered 2016-04-21: 2 via ORAL
  Filled 2016-04-21: qty 2

## 2016-04-21 MED ORDER — OXYCODONE-ACETAMINOPHEN 5-325 MG PO TABS: 2.0000 | ORAL_TABLET | ORAL | Status: DC | PRN

## 2016-04-21 MED ORDER — PREDNISONE 20 MG PO TABS
60.0000 mg | ORAL_TABLET | Freq: Once | ORAL | Status: AC
  Administered 2016-04-21: 60 mg via ORAL
  Filled 2016-04-21: qty 3

## 2016-04-21 MED ORDER — ONDANSETRON 4 MG PO TBDP
4.0000 mg | ORAL_TABLET | Freq: Once | ORAL | Status: AC
  Administered 2016-04-21: 4 mg via ORAL
  Filled 2016-04-21: qty 1

## 2016-04-21 MED ORDER — PREDNISONE 20 MG PO TABS: ORAL_TABLET | ORAL | Status: DC

## 2016-04-21 NOTE — ED Notes (Signed)
Pt c/o Lupus flair up, states she hurts everywhere, hurts to walk, count money at work. Pt ambulatory in triage. Pt denies CP. Pt was seeing rheumatologist but lost insurance, hasn't been on medication for a year.

## 2016-04-21 NOTE — Discharge Instructions (Signed)
Systemic Lupus Erythematosus, Adult Systemic lupus erythematosus is a long-term (chronic) disease that can affect many parts of the body. It can damage the skin, joints, blood vessels, brain, kidneys, lungs, heart, and other internal organs. It causes pain, irritation, and inflammation. Systemic lupus erythematosus is an autoimmune disease. With this type of disease, the body's defense system (immune system) mistakenly attacks normal tissues instead of attacking germs or abnormal growths. CAUSES The cause of this condition is not known. RISK FACTORS This condition is more likely to develop in:  Females.  People of Asian descent.  People of African-American descent.  People who have a family history of the condition. SYMPTOMS General symptoms include:  Joint pain and swelling (common).  Fever.  Fatigue.  Unusual weight loss or weight gain.  Skin rashes, especially over the nose and cheeks (butterfly rash) and after sun exposure.  Sores inside the mouth or nose. Other symptoms depend on which parts of the body are affected. They can include:  Shortness of breath.  Chest pain.  Frequent urination.  Blood in the urine.  Seizures.  Mental changes.  Hair loss.  Swollen and tender lymph nodes.  Swelling of the hands or feet. Symptoms can come and go. A period of time when symptoms get worse or come back is called a flare. A period of time with no symptoms is called a remission. DIAGNOSIS This condition is diagnosed based on symptoms, a medical history, and a physical exam. You may also have tests, including:  Blood tests.  Urine tests.  A chest X-ray.  A skin or kidney biopsy. For this test, a sample of tissue is taken from the skin or kidney and studied under a microscope. You may be referred to an autoimmune disease specialist (rheumatologist). TREATMENT There is no cure for this condition, but treatment can keep the disease in remission, help to control  symptoms, and prevent damage to the heart, lungs, kidneys, and other organs. Treatment may involve taking a combination of medicines over time. HOME CARE INSTRUCTIONS Medicines  Take medicines only as directed by your health care provider.  Do not take any medicines that contain estrogen without first checking with your health care provider. Estrogen can trigger flares and may increase your risk for blood clots. Lifestyle  Eat a heart-healthy diet.  Stay active as directed by your health care provider.  Do not smoke. If you need help quitting, ask your health care provider.  Protect your skin from the sun by applying sunblock and wearing protective hats and clothing.  Learn as much as you can about your condition and have a good support system in place. Support may come from family, friends, or a lupus support group. General Instructions  Keep all follow-up visits as directed by your health care provider. This is important.  Work closely with all of your health care providers to manage your condition.  Let your health care provider know right away if you become pregnant or if you plan to become pregnant. Pregnancy in women with this condition is considered high risk. SEEK MEDICAL CARE IF:  You have a fever.  Your symptoms flare.  You develop new symptoms.  You develop swollen feet or hands.  You develop puffiness around your eyes.  Your medicines are not working.  You have bloody, foamy, or coffee-colored urine.  There are changes in your urination. For example, you urinate more often at night.  You think that you may be depressed or have anxiety. [place]  IF:  You have chest pain.  You have trouble breathing.  You have a seizure.  You suddenly get a very bad headache.  You suddenly develop facial or body weakness.  You cannot speak.  You cannot understand speech.   This information is not intended to replace advice given to you by your  health care provider. Make sure you discuss any questions you have with your health care provider.   Document Released: 11/20/2002 Document Revised: 04/16/2015 Document Reviewed: 11/07/2014 Elsevier Interactive Patient Education 2016 [employer]. [employer] Assistance The United Ways 211 is a great source of information about community services available.  Access by dialing 2-1-1 from anywhere in [place], or by website -  PooledIncome.pl.   Other Local Resources (Updated 12/2015)  Financial Assistance   Services    Phone Number and Address  [facility]  Low-cost medical care - 1st and 3rd Saturday of every month  Must not qualify for public or private insurance and must have limited income [phone] [address] [place], [place]    [place] [employer] of Social Services  Child care  Emergency assistance for housing and [employer]  Medicaid [phone] [address] [place], [place] [address]   [facility] Department  Low-cost medical care for children, communicable diseases, sexually-transmitted diseases, immunizations, maternity care, womens health and family planning [phone] [address] [place], [place] [address]  [facility]   Medication assistance for [facility] residents  Must meet income requirements [phone] [address] [place] [address] [place], [place].    [facility]  Child care  Emergency assistance for housing and [employer]  Medicaid [phone] [address] [place], [place] [address]  [facility]   Low-cost medical care,   Monday through Friday, 9 am to 6 pm.   Accepts Medicare/Medicaid, and self-pay [phone] [address] [place], [place] [address]  [facility]  Low-cost medical care - Monday through Friday,  8:30 am - 5:30 pm  Accepts Medicaid and self-pay [phone] [address] [place], [place] [address]   [facility]  Primary medical care, including for those with sickle cell disease  Accepts Medicare, Medicaid, insurance and self-pay [phone] [address] [place], [place]  [facility]   Primary medical care  Accepts Medicare, [place], insurance and self-pay [phone] [address] [place], [place] [address]   [facility] Department of Social Services  Child care  Emergency assistance for housing and [employer]  Medicaid [phone] [address] [place], [place] [address]  [facility] Department of Health and [employer]  Child care  Emergency assistance for housing and [employer]  Medicaid [phone] [address] [place], [place] [address]   [facility] Medication Assistance Program  Medication assistance for [facility] residents with no insurance only  Must have a primary care doctor [phone] [address] [place], [place]  [facility]   Primary medical care  [place], [place], insurance  [phone] [place], [place]  [facility]   Medication assistance [phone]  [facility]   Primary medical care  Accepts Medicare, [place], insurance and self-pay [phone] [address] [place], [place] [address]  [facility]   Primary medical care  Accepts Medicare, Medicaid, insurance  and self-pay [phone] [address] [place], [place] [address]  [facility]  For [place] residents between the ages of 5518 and 2164 who do not have any form of health insurance, Medicare, [place], or [place] benefits.  Services are provided free of charge to uninsured patients who fall within federal poverty guidelines.     Hours: Tuesdays and Thursdays, 4:15 - 8 pm [phone] [address] [place], [place] [address]  [facility]     Primary medical care  Dental care  Nutritional counseling  Pharmacy  Accepts Medicaid, Medicare, most insurance.  Fees are adjusted based on ability to pay.   [phone] 252 [address] [facility] [address] [place], [place]  [phone] [facility] [address] [place], [place]  [phone] [facility] [place], [place]  [phone] [facility], [address] [place], [place]  [phone] 262 6847 [facility] [address] [place], [place]  [facility]  Womens health and family planning [phone] [address] [place], [place]  [facility] Department of Social Services  Child care  Emergency assistance for housing and [employer]arkutilities  Food stamps  Medicaid [phone] [address], [place], [place] [address]   [facility]    Ages 2118 and older  Hours: Mondays and Thursdays, 7:00 am - 9:00 am Patients are seen on a first come, first served basis. [phone], ext. [phone] [place], [place]  [facility] of Social Services  Child care  Emergency assistance for housing and [employer]ly[employer]arkutilities  Food stamps  Medicaid [phone] [place], [place] [id number]  [employer]  Medication assistance  Rental assistance  Food pantry  Medication assistance  Housing assistance  Emergency food distribution  Utility assistance [phone] [address] [place], [place]  [phone]  [address] [place], [place] [address] Hours: Tuesdays and Thursdays from 9am - 12 noon by appointment only  [phone] [address] [place], [place] [address]  [facility] - Lanae Boastlara F. Gunn   Accepts private insurance,  [place]are, and [place]icaid.  Payment is based on a sliding scale for those without insurance.  Hours: Mondays, Tuesdays and Thursdays, 8:30 am - 5:30 pm.   [phone] [address] [place], [place]  [facility] - Family Medicine at [facility]    Accepts private insurance, [place], and [place]icaid.  Payment is based on a sliding scale for those without insurance. [phone] [address] [place], [place]  [facility] - Pediatrics at [address]. Scientist, research (physical sciences)[place]  Accepts private insurance, [employer] EntertainmentMedicare, and [place]icaid.  Payment is based on a sliding scale for those without insurance [phone] [address], [place], [place]  [facility] - [facility] at [place]  Accepts private insurance, [place]are, and [place]icaid.  Payment is based on a sliding scale for those without insurance. [phone] [address] [place], [place]  [facility] - [facility] at [facility]  Accepts private insurance, [place], and [place]icaid.  Payment is based on a sliding scale for those without insurance. [phone], ext. [phone] [place], [place].    [facility]  Maternity care.  Accepts Medicaid and self-pay. [phone] [address] [place], [place]

## 2016-04-21 NOTE — ED Provider Notes (Signed)
CSN: [id number]     Arrival date & time 04/21/16  1240 History   First MD Initiated Contact with Patient 04/21/16 1347     Chief Complaint  Patient presents with  . Lupus     (Consider location/radiation/quality/duration/timing/severity/associated sxs/prior Treatment) HPI Patient reports that she's been out of her Lupus medication for almost a year. She reports she's been unable to see her rheumatologist due to financial inability. She states now she is had a flareup for several weeks. She reports all of her joints hurt and when she gets up in the morning she has to sleep in a recliner just to prop her self up. She reports when she is working her knees hurt so badly she can hardly stand. She also reports pain in her hands and wrists. She reports that she's getting a rash on her face as well. No fevers, no chills, no nausea, no vomiting. She reports in the past when she's had symptoms like this she usually gets prednisone and a pain medication. She describes occasionally having been on a Lupus specific immune suppressant but not chronically. Past Medical History  Diagnosis Date  . Seizure disorder (HCC)   . Lupus (HCC)   . Lupus (systemic lupus erythematosus) (HCC) 2007  . Lupus (systemic lupus erythematosus) (HCC) 2007  . Endometriosis   . Seizures (HCC)     last sz 06/2013, no meds  . DVT (deep venous thrombosis) (HCC)     right arm  . Migraine    Past Surgical History  Procedure Laterality Date  . Abdominal hysterectomy      x2 partial  . Cesarean section      x3  . Mass excision  02/2014    abd   Family History  Problem Relation Age of Onset  . Cancer Sister     breast & ovarian  . Cancer Maternal Aunt     breast & ovarian  . Birth defects Maternal Uncle    Social History  Substance Use Topics  . Smoking status: Never Smoker   . Smokeless tobacco: Never Used  . Alcohol Use: No   OB History    Gravida Para Term Preterm AB TAB SAB Ectopic Multiple Living   Review of Systems  10 Systems reviewed and are negative for acute change except as noted in the HPI.   Allergies  Diamox; Nsaids; Omnipaque; Compazine; Lactose intolerance (gi); and Reglan  Home Medications   Prior to Admission medications   Medication Sig Start Date End Date Taking? Authorizing Provider  acetaminophen (TYLENOL) 500 MG tablet Take 500-1,000 mg by mouth every 6 (six) hours as needed for moderate pain or headache.    Yes Historical Provider, MD  Cyanocobalamin (VITAMIN B-12 PO) Take 1 tablet by mouth daily.   Yes Historical Provider, MD  EPINEPHrine 0.3 mg/0.3 mL IJ SOAJ injection Inject 0.3 mg into the muscle daily as needed (allergic reaction).   Yes Historical Provider, MD  tobramycin-dexamethasone Wallene Dales) ophthalmic ointment Place 1 application into both eyes daily.   Yes Historical Provider, MD  lidocaine (LIDODERM) 5 % Place 1 patch onto the skin daily. Remove & Discard patch within 12 hours or as directed by MD Patient not taking: Reported on 04/21/2016 12/15/15   April Palumbo, MD  oxyCODONE-acetaminophen (PERCOCET) 5-325 MG tablet Take 1 tablet by mouth every 6 (six) hours as needed. Patient not taking: Reported on 04/21/2016 12/15/15   April  Palumbo, MD  oxyCODONE-acetaminophen (PERCOCET) 5-325 MG tablet Take 2 tablets by mouth every 4 (four) hours as needed. 04/21/16   Arby BarretteMarcy Edithe Dobbin, MD  predniSONE (DELTASONE) 20 MG tablet 3 tabs po day one, then 2 po daily x 4 days Patient not taking: Reported on 04/21/2016 12/15/15   April Palumbo, MD  predniSONE (DELTASONE) 20 MG tablet 3 tabs po daily x 3 days, then 2 tabs x 3 days, then 1.5 tabs x 3 days, then 1 tab x 3 days, then 0.5 tabs x 3 days 04/21/16   Arby BarretteMarcy Ashish Rossetti, MD   BP 110/83 mmHg  Pulse 84  Temp(Src) 98.8 F (37.1 C) (Oral)  Resp 17  Ht 5\' 3"  (1.6 m)  Wt 160 lb (72.576 kg)  BMI 28.35 kg/m2  SpO2 100%  LMP 06/14/2003 Physical Exam  Constitutional: She is oriented to person, place, and time. She appears  well-developed and well-nourished.  Patient appears uncomfortable but she is well in appearance. No respiratory distress and clear mental status.  HENT:  Head: Normocephalic and atraumatic.  Mouth/Throat: Oropharynx is clear and moist.  No intraoral lesions. I do not note significant malar rash. There is possibly mild increased erythema on the cheeks.  Eyes: EOM are normal. Pupils are equal, round, and reactive to light. No scleral icterus.  Neck: Neck supple.  Cardiovascular: Normal rate, regular rhythm, normal heart sounds and intact distal pulses.   Pulmonary/Chest: Effort normal and breath sounds normal.  Abdominal: Soft. Bowel sounds are normal. She exhibits no distension. There is no tenderness.  Musculoskeletal: Normal range of motion. She exhibits tenderness. She exhibits no edema.  Patient endorses tenderness to palpation over her wrists and hands. They are not erythematous or edematous at this time. She also endorses tenderness over her ankles and her feet. Again no effusions, edema or erythema. She does have intact range of motion but she reports all of these movements hurt at each joint.  Neurological: She is alert and oriented to person, place, and time. She has normal strength. No cranial nerve deficit. She exhibits normal muscle tone. Coordination normal. GCS eye subscore is 4. GCS verbal subscore is 5. GCS motor subscore is 6.  Skin: Skin is warm, dry and intact.  No significant rashes present.  Psychiatric: She has a normal mood and affect.    ED Course  Procedures (including critical care time) Labs Review Labs Reviewed - No data to display  Imaging Review No results found. I have personally reviewed and evaluated these images and lab results as part of my medical decision-making.   EKG Interpretation None      MDM   Final diagnoses:  Lupus (HCC)   Patient presents with generalized arthralgia and facial rash. She is nontoxic with stable vital signs and  otherwise normal mental status and no respiratory distress. Patient will be provided with a prednisone taper and Percocet for pain control. She is counseled to follow-up with her rheumatologist as soon as possible. She is also given social services contact numbers to assist with financial need for medical care.    Arby BarretteMarcy Shigeko Manard, MD 04/21/16 [phone]

## 2016-10-17 ENCOUNTER — Emergency Department (HOSPITAL_COMMUNITY): Payer: Self-pay

## 2016-10-17 ENCOUNTER — Encounter (HOSPITAL_COMMUNITY): Payer: Self-pay | Admitting: *Deleted

## 2016-10-17 ENCOUNTER — Emergency Department (HOSPITAL_COMMUNITY)
Admission: EM | Admit: 2016-10-17 | Discharge: 2016-10-18 | Disposition: A | Discharge status: Home / Self Care | Payer: Self-pay | Attending: Emergency Medicine | Admitting: Emergency Medicine

## 2016-10-17 DIAGNOSIS — M329 Systemic lupus erythematosus, unspecified: Secondary | ICD-10-CM | POA: Insufficient documentation

## 2016-10-17 DIAGNOSIS — R0602 Shortness of breath: Secondary | ICD-10-CM | POA: Insufficient documentation

## 2016-10-17 DIAGNOSIS — Z79899 Other long term (current) drug therapy: Secondary | ICD-10-CM | POA: Insufficient documentation

## 2016-10-17 LAB — URINALYSIS, ROUTINE W REFLEX MICROSCOPIC
Bilirubin Urine: NEGATIVE
Glucose, UA: NEGATIVE mg/dL
HGB URINE DIPSTICK: NEGATIVE
Ketones, ur: NEGATIVE mg/dL
Leukocytes, UA: NEGATIVE
Nitrite: NEGATIVE
Protein, ur: NEGATIVE mg/dL
SPECIFIC GRAVITY, URINE: 1.014 (ref 1.005–1.030)
pH: 7.5 (ref 5.0–8.0)

## 2016-10-17 LAB — COMPREHENSIVE METABOLIC PANEL
ALT: 19 U/L (ref 14–54)
ANION GAP: 9 (ref 5–15)
AST: 25 U/L (ref 15–41)
Albumin: 4.8 g/dL (ref 3.5–5.0)
Alkaline Phosphatase: 106 U/L (ref 38–126)
BILIRUBIN TOTAL: 0.6 mg/dL (ref 0.3–1.2)
BUN: 15 mg/dL (ref 6–20)
CO2: 25 mmol/L (ref 22–32)
Calcium: 9.8 mg/dL (ref 8.9–10.3)
Chloride: 107 mmol/L (ref 101–111)
Creatinine, Ser: 0.78 mg/dL (ref 0.44–1.00)
GFR calc Af Amer: >60 mL/min (ref >60)
Glucose, Bld: 95 mg/dL (ref 65–99)
POTASSIUM: 3.8 mmol/L (ref 3.5–5.1)
Sodium: 141 mmol/L (ref 135–145)
Total Protein: 7.9 g/dL (ref 6.5–8.1)

## 2016-10-17 LAB — CBC WITH DIFFERENTIAL/PLATELET
BASOS ABS: 0 10*3/uL (ref 0.0–0.1)
BASOS PCT: 0 %
EOS ABS: 0.1 10*3/uL (ref 0.0–0.7)
Eosinophils Relative: 2 %
HEMATOCRIT: 35.9 % — AB (ref 36.0–46.0)
Hemoglobin: 11.6 g/dL — ABNORMAL LOW (ref 12.0–15.0)
Lymphocytes Relative: 36 %
Lymphs Abs: 2.6 10*3/uL (ref 0.7–4.0)
MCH: 26.5 pg (ref 26.0–34.0)
MCHC: 32.3 g/dL (ref 30.0–36.0)
MCV: 82 fL (ref 78.0–100.0)
MONO ABS: 0.5 10*3/uL (ref 0.1–1.0)
Monocytes Relative: 7 %
NEUTROS ABS: 4 10*3/uL (ref 1.7–7.7)
Neutrophils Relative %: 55 %
Platelets: 337 10*3/uL (ref 150–400)
RBC: 4.38 MIL/uL (ref 3.87–5.11)
RDW: 13.8 % (ref 11.5–15.5)
WBC: 7.3 10*3/uL (ref 4.0–10.5)

## 2016-10-17 LAB — BRAIN NATRIURETIC PEPTIDE: B Natriuretic Peptide: 13.8 pg/mL (ref 0.0–100.0)

## 2016-10-17 LAB — D-DIMER, QUANTITATIVE: D-Dimer, Quant: <0.27 ug/mL-FEU (ref 0.00–0.50)

## 2016-10-17 LAB — I-STAT TROPONIN, ED: Troponin i, poc: 0 ng/mL (ref 0.00–0.08)

## 2016-10-17 LAB — SEDIMENTATION RATE: SED RATE: 15 mm/h (ref 0–22)

## 2016-10-17 LAB — LIPASE, BLOOD: LIPASE: 26 U/L (ref 11–51)

## 2016-10-17 MED ORDER — MORPHINE SULFATE (PF) 2 MG/ML IV SOLN
4.0000 mg | Freq: Once | INTRAVENOUS | Status: AC
Start: 2016-10-17 — End: 2016-10-17
  Administered 2016-10-17: 4 mg via INTRAVENOUS
  Filled 2016-10-17: qty 2

## 2016-10-17 MED ORDER — IPRATROPIUM-ALBUTEROL 0.5-2.5 (3) MG/3ML IN SOLN
3.0000 mL | Freq: Once | RESPIRATORY_TRACT | Status: AC
  Administered 2016-10-17: 3 mL via RESPIRATORY_TRACT
  Filled 2016-10-17: qty 3

## 2016-10-17 MED ORDER — ONDANSETRON HCL 4 MG/2ML IJ SOLN
4.0000 mg | Freq: Once | INTRAMUSCULAR | Status: AC
  Administered 2016-10-17: 4 mg via INTRAVENOUS
  Filled 2016-10-17: qty 2

## 2016-10-17 MED ORDER — MORPHINE SULFATE (PF) 2 MG/ML IV SOLN
4.0000 mg | Freq: Once | INTRAVENOUS | Status: AC
  Administered 2016-10-17: 4 mg via INTRAVENOUS
  Filled 2016-10-17: qty 2

## 2016-10-17 MED ORDER — DEXAMETHASONE SODIUM PHOSPHATE 10 MG/ML IJ SOLN
10.0000 mg | Freq: Once | INTRAMUSCULAR | Status: AC
  Administered 2016-10-17: 10 mg via INTRAVENOUS
  Filled 2016-10-17: qty 1

## 2016-10-17 MED ORDER — DIPHENHYDRAMINE HCL 50 MG/ML IJ SOLN
25.0000 mg | Freq: Once | INTRAMUSCULAR | Status: AC
  Administered 2016-10-17: 25 mg via INTRAVENOUS
  Filled 2016-10-17: qty 1

## 2016-10-17 MED ORDER — SODIUM CHLORIDE 0.9 % IV BOLUS (SEPSIS)
500.0000 mL | Freq: Once | INTRAVENOUS | Status: AC
  Administered 2016-10-17: 500 mL via INTRAVENOUS

## 2016-10-17 MED ORDER — IOPAMIDOL (ISOVUE-370) INJECTION 76%
100.0000 mL | Freq: Once | INTRAVENOUS | Status: AC | PRN
  Administered 2016-10-17: 100 mL via INTRAVENOUS

## 2016-10-17 NOTE — ED Triage Notes (Signed)
Pt states that she has a hx of Lupus and has had generalized body pain x 4 days; pt denies injury; pt c/o nausea

## 2016-10-17 NOTE — ED Notes (Signed)
Requested patient to urinate. 
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
## 2016-10-18 LAB — C-REACTIVE PROTEIN: CRP: <0.8 mg/dL (ref <1.0)

## 2016-10-18 MED ORDER — OMEPRAZOLE 20 MG PO CPDR: 20.0000 mg | DELAYED_RELEASE_CAPSULE | Freq: Every day | ORAL | 0 refills | Status: DC

## 2016-10-18 MED ORDER — ALBUTEROL SULFATE HFA 108 (90 BASE) MCG/ACT IN AERS
2.0000 | INHALATION_SPRAY | Freq: Once | RESPIRATORY_TRACT | Status: AC
  Administered 2016-10-18: 2 via RESPIRATORY_TRACT
  Filled 2016-10-18: qty 6.7

## 2016-10-18 MED ORDER — PREDNISONE 10 MG (21) PO TBPK: 10.0000 mg | ORAL_TABLET | Freq: Every day | ORAL | 0 refills | Status: DC

## 2016-10-23 ENCOUNTER — Emergency Department (HOSPITAL_COMMUNITY)
Admission: EM | Admit: 2016-10-23 | Discharge: 2016-10-24 | Disposition: A | Discharge status: Home / Self Care | Payer: Self-pay | Attending: Emergency Medicine | Admitting: Emergency Medicine

## 2016-10-23 ENCOUNTER — Emergency Department (HOSPITAL_COMMUNITY): Payer: Self-pay

## 2016-10-23 ENCOUNTER — Encounter (HOSPITAL_COMMUNITY): Payer: Self-pay | Admitting: Emergency Medicine

## 2016-10-23 DIAGNOSIS — Z79899 Other long term (current) drug therapy: Secondary | ICD-10-CM | POA: Insufficient documentation

## 2016-10-23 DIAGNOSIS — R109 Unspecified abdominal pain: Secondary | ICD-10-CM

## 2016-10-23 DIAGNOSIS — R1084 Generalized abdominal pain: Secondary | ICD-10-CM | POA: Insufficient documentation

## 2016-10-23 DIAGNOSIS — Z791 Long term (current) use of non-steroidal anti-inflammatories (NSAID): Secondary | ICD-10-CM | POA: Insufficient documentation

## 2016-10-23 DIAGNOSIS — R112 Nausea with vomiting, unspecified: Secondary | ICD-10-CM | POA: Insufficient documentation

## 2016-10-23 LAB — COMPREHENSIVE METABOLIC PANEL
ALT: 32 U/L (ref 14–54)
ANION GAP: 10 (ref 5–15)
AST: 37 U/L (ref 15–41)
Albumin: 5 g/dL (ref 3.5–5.0)
Alkaline Phosphatase: 111 U/L (ref 38–126)
BUN: 20 mg/dL (ref 6–20)
CHLORIDE: 105 mmol/L (ref 101–111)
CO2: 23 mmol/L (ref 22–32)
Calcium: 9.9 mg/dL (ref 8.9–10.3)
Creatinine, Ser: 0.67 mg/dL (ref 0.44–1.00)
Glucose, Bld: 148 mg/dL — ABNORMAL HIGH (ref 65–99)
POTASSIUM: 4.3 mmol/L (ref 3.5–5.1)
Sodium: 138 mmol/L (ref 135–145)
Total Bilirubin: 0.5 mg/dL (ref 0.3–1.2)
Total Protein: 8.2 g/dL — ABNORMAL HIGH (ref 6.5–8.1)

## 2016-10-23 LAB — URINALYSIS, ROUTINE W REFLEX MICROSCOPIC
BILIRUBIN URINE: NEGATIVE
GLUCOSE, UA: NEGATIVE mg/dL
HGB URINE DIPSTICK: NEGATIVE
KETONES UR: NEGATIVE mg/dL
LEUKOCYTES UA: NEGATIVE
Nitrite: NEGATIVE
PH: 6.5 (ref 5.0–8.0)
Protein, ur: NEGATIVE mg/dL
Specific Gravity, Urine: 1.016 (ref 1.005–1.030)

## 2016-10-23 LAB — CBC
HEMATOCRIT: 37 % (ref 36.0–46.0)
Hemoglobin: 12 g/dL (ref 12.0–15.0)
MCH: 26.7 pg (ref 26.0–34.0)
MCHC: 32.4 g/dL (ref 30.0–36.0)
MCV: 82.4 fL (ref 78.0–100.0)
Platelets: 327 10*3/uL (ref 150–400)
RBC: 4.49 MIL/uL (ref 3.87–5.11)
RDW: 13.9 % (ref 11.5–15.5)
WBC: 10.2 10*3/uL (ref 4.0–10.5)

## 2016-10-23 LAB — I-STAT BETA HCG BLOOD, ED (MC, WL, AP ONLY)

## 2016-10-23 LAB — LIPASE, BLOOD: LIPASE: 26 U/L (ref 11–51)

## 2016-10-23 MED ORDER — SODIUM CHLORIDE 0.9 % IV BOLUS (SEPSIS)
1000.0000 mL | Freq: Once | INTRAVENOUS | Status: AC
  Administered 2016-10-23: 1000 mL via INTRAVENOUS

## 2016-10-23 MED ORDER — HYDROMORPHONE HCL 1 MG/ML IJ SOLN
0.5000 mg | INTRAMUSCULAR | Status: DC | PRN
  Administered 2016-10-23 (×2): 0.5 mg via INTRAVENOUS
  Filled 2016-10-23 (×2): qty 1

## 2016-10-23 MED ORDER — SODIUM CHLORIDE 0.9 % IV SOLN
INTRAVENOUS | Status: DC
  Administered 2016-10-23: 23:00:00 via INTRAVENOUS

## 2016-10-23 MED ORDER — ONDANSETRON HCL 4 MG/2ML IJ SOLN
4.0000 mg | Freq: Once | INTRAMUSCULAR | Status: AC
  Administered 2016-10-23: 4 mg via INTRAVENOUS
  Filled 2016-10-23: qty 2

## 2016-10-23 NOTE — ED Notes (Signed)
Patient transported to CT 

## 2016-10-23 NOTE — Progress Notes (Addendum)
EDCM spoke to patient at bedside. Patient confirms she [place] a pcp or insurance [place].  [facility]  to [facility], informed patient of services there.  EDCM also provided patient with list of pcps who accept self pay patients, list of discount pharmacies and websites needymeds.org and GoodRX.com for medication assistance, phone number to inquire about the orange card, phone number to inquire about Medicaid, phone number to inquire about the Affordable Care Act, financial resources in the community such as local churches, [employer], urban ministries, and dental assistance for uninsured patients.  Patient thankful for resources.  No further EDCM needs at this time.  Patient reports she does not have Medicaid insurance.  Patient reports she will be getting insurance in January.  Patient reports she is looking for a pcp and a rheumatologist in [place].  She reports she has an appointment with [facility] in January.

## 2016-10-23 NOTE — ED Triage Notes (Signed)
Pt reports having vomiting and body pain that stated on 10/20/16 and was recently seen on 10/17/16 for Lupus flare up. Pt report not being able to keep fluids down since then.

## 2016-10-23 NOTE — ED Notes (Signed)
Patient returned from CT
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
## 2016-10-24 MED ORDER — TRAMADOL HCL 50 MG PO TABS: 50.0000 mg | ORAL_TABLET | Freq: Four times a day (QID) | ORAL | 0 refills | Status: DC | PRN

## 2016-10-24 MED ORDER — ONDANSETRON 8 MG PO TBDP: 8.0000 mg | ORAL_TABLET | Freq: Three times a day (TID) | ORAL | 0 refills | Status: DC | PRN

## 2017-01-17 ENCOUNTER — Encounter (HOSPITAL_COMMUNITY): Payer: Self-pay | Admitting: Emergency Medicine

## 2017-01-17 ENCOUNTER — Emergency Department (HOSPITAL_COMMUNITY): Payer: BLUE CROSS/BLUE SHIELD

## 2017-01-17 ENCOUNTER — Emergency Department (HOSPITAL_COMMUNITY)
Admission: EM | Admit: 2017-01-17 | Discharge: 2017-01-17 | Disposition: A | Discharge status: Home / Self Care | Payer: BLUE CROSS/BLUE SHIELD | Attending: Emergency Medicine | Admitting: Emergency Medicine

## 2017-01-17 DIAGNOSIS — Z79899 Other long term (current) drug therapy: Secondary | ICD-10-CM | POA: Diagnosis not present

## 2017-01-17 DIAGNOSIS — R091 Pleurisy: Secondary | ICD-10-CM

## 2017-01-17 DIAGNOSIS — Z7982 Long term (current) use of aspirin: Secondary | ICD-10-CM | POA: Insufficient documentation

## 2017-01-17 DIAGNOSIS — R0602 Shortness of breath: Secondary | ICD-10-CM | POA: Diagnosis present

## 2017-01-17 LAB — CBC
HEMATOCRIT: 39.5 % (ref 36.0–46.0)
Hemoglobin: 12.6 g/dL (ref 12.0–15.0)
MCH: 26 pg (ref 26.0–34.0)
MCHC: 31.9 g/dL (ref 30.0–36.0)
MCV: 81.6 fL (ref 78.0–100.0)
Platelets: 343 10*3/uL (ref 150–400)
RBC: 4.84 MIL/uL (ref 3.87–5.11)
RDW: 13.5 % (ref 11.5–15.5)
WBC: 5.1 10*3/uL (ref 4.0–10.5)

## 2017-01-17 LAB — BASIC METABOLIC PANEL
Anion gap: 7 (ref 5–15)
BUN: 18 mg/dL (ref 6–20)
CHLORIDE: 106 mmol/L (ref 101–111)
CO2: 26 mmol/L (ref 22–32)
CREATININE: 0.96 mg/dL (ref 0.44–1.00)
Calcium: 9.7 mg/dL (ref 8.9–10.3)
GFR calc Af Amer: >60 mL/min (ref >60)
GLUCOSE: 104 mg/dL — AB (ref 65–99)
POTASSIUM: 4.5 mmol/L (ref 3.5–5.1)
Sodium: 139 mmol/L (ref 135–145)

## 2017-01-17 LAB — I-STAT TROPONIN, ED: Troponin i, poc: 0 ng/mL (ref 0.00–0.08)

## 2017-01-17 LAB — D-DIMER, QUANTITATIVE: D-Dimer, Quant: <0.27 ug/mL-FEU (ref 0.00–0.50)

## 2017-01-17 LAB — PROTIME-INR
INR: 0.94
Prothrombin Time: 12.5 seconds (ref 11.4–15.2)

## 2017-01-17 MED ORDER — TECHNETIUM TO 99M ALBUMIN AGGREGATED
4.4000 | Freq: Once | INTRAVENOUS | Status: AC | PRN
  Administered 2017-01-17: 4.4 via INTRAVENOUS

## 2017-01-17 MED ORDER — HYDROMORPHONE HCL 1 MG/ML IJ SOLN
1.0000 mg | Freq: Once | INTRAMUSCULAR | Status: AC
  Administered 2017-01-17: 1 mg via INTRAVENOUS
  Filled 2017-01-17: qty 1

## 2017-01-17 MED ORDER — PREDNISONE 20 MG PO TABS: ORAL_TABLET | ORAL | 0 refills | Status: DC

## 2017-01-17 MED ORDER — ALBUTEROL SULFATE HFA 108 (90 BASE) MCG/ACT IN AERS: 1.0000 | INHALATION_SPRAY | Freq: Four times a day (QID) | RESPIRATORY_TRACT | 0 refills | Status: DC | PRN

## 2017-01-17 MED ORDER — ONDANSETRON HCL 4 MG/2ML IJ SOLN
4.0000 mg | Freq: Once | INTRAMUSCULAR | Status: AC
  Administered 2017-01-17: 4 mg via INTRAVENOUS
  Filled 2017-01-17: qty 2

## 2017-01-17 MED ORDER — ALBUTEROL SULFATE (2.5 MG/3ML) 0.083% IN NEBU
5.0000 mg | INHALATION_SOLUTION | Freq: Once | RESPIRATORY_TRACT | Status: AC
  Administered 2017-01-17: 5 mg via RESPIRATORY_TRACT
  Filled 2017-01-17: qty 6

## 2017-01-17 MED ORDER — BENZONATATE 100 MG PO CAPS: 100.0000 mg | ORAL_CAPSULE | Freq: Three times a day (TID) | ORAL | 0 refills | Status: DC

## 2017-01-17 MED ORDER — IPRATROPIUM-ALBUTEROL 0.5-2.5 (3) MG/3ML IN SOLN
3.0000 mL | Freq: Once | RESPIRATORY_TRACT | Status: AC
  Administered 2017-01-17: 3 mL via RESPIRATORY_TRACT
  Filled 2017-01-17: qty 3

## 2017-01-17 MED ORDER — TECHNETIUM TC 99M DIETHYLENETRIAME-PENTAACETIC ACID
30.6000 | Freq: Once | INTRAVENOUS | Status: AC | PRN
  Administered 2017-01-17: 30.6 via RESPIRATORY_TRACT

## 2017-01-17 MED ORDER — HYDROCODONE-ACETAMINOPHEN 5-325 MG PO TABS: 1.0000 | ORAL_TABLET | ORAL | 0 refills | Status: DC | PRN

## 2017-01-17 NOTE — ED Notes (Signed)
Patient transported to X-ray for [employer]

## 2017-01-17 NOTE — ED Notes (Signed)
US IV was placed by Elpidio GaleaSusan RN

## 2017-01-17 NOTE — ED Provider Notes (Signed)
VQ scan is normal. D-dimer is normal. Patient's vital signs are stable. Chest pain appears to be pleuritic in nature. Question pleurisy versus musculoskeletal. We'll discharge home with short course of prednisone and pain control. Advised to follow-up with primary physician. Return precautions given.   Loren Raceravid Kiele Heavrin, MD 01/17/17 2033

## 2017-01-17 NOTE — ED Notes (Signed)
Patient transported to X-ray 

## 2017-01-17 NOTE — ED Notes (Signed)
Attempt IV placement x2 without success.  Second RN to attempt US IV
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
## 2017-01-17 NOTE — ED Triage Notes (Signed)
Pt presents with SOB for past 2 weeks. Lung sounds are diminished with minor wheezing, labored breathing prefers to be sitting upwards. Pts sitter states pt has lupus and c/o hip pain, had difficulty getting out of bed this morning.

## 2017-05-23 ENCOUNTER — Encounter (HOSPITAL_COMMUNITY): Payer: Self-pay

## 2017-05-23 ENCOUNTER — Emergency Department (HOSPITAL_COMMUNITY): Payer: BLUE CROSS/BLUE SHIELD

## 2017-05-23 ENCOUNTER — Observation Stay (HOSPITAL_COMMUNITY)
Admission: EM | Admit: 2017-05-23 | Discharge: 2017-05-24 | Disposition: A | Discharge status: Home / Self Care | Payer: BLUE CROSS/BLUE SHIELD | Attending: Internal Medicine | Admitting: Internal Medicine

## 2017-05-23 DIAGNOSIS — H5462 Unqualified visual loss, left eye, normal vision right eye: Secondary | ICD-10-CM | POA: Diagnosis not present

## 2017-05-23 DIAGNOSIS — R519 Headache, unspecified: Secondary | ICD-10-CM

## 2017-05-23 DIAGNOSIS — G40909 Epilepsy, unspecified, not intractable, without status epilepticus: Secondary | ICD-10-CM | POA: Diagnosis not present

## 2017-05-23 DIAGNOSIS — Z888 Allergy status to other drugs, medicaments and biological substances status: Secondary | ICD-10-CM | POA: Insufficient documentation

## 2017-05-23 DIAGNOSIS — Z86718 Personal history of other venous thrombosis and embolism: Secondary | ICD-10-CM | POA: Diagnosis not present

## 2017-05-23 DIAGNOSIS — Z79899 Other long term (current) drug therapy: Secondary | ICD-10-CM | POA: Insufficient documentation

## 2017-05-23 DIAGNOSIS — I10 Essential (primary) hypertension: Secondary | ICD-10-CM | POA: Diagnosis not present

## 2017-05-23 DIAGNOSIS — R531 Weakness: Secondary | ICD-10-CM | POA: Diagnosis not present

## 2017-05-23 DIAGNOSIS — G459 Transient cerebral ischemic attack, unspecified: Secondary | ICD-10-CM | POA: Diagnosis present

## 2017-05-23 DIAGNOSIS — G43109 Migraine with aura, not intractable, without status migrainosus: Secondary | ICD-10-CM | POA: Diagnosis not present

## 2017-05-23 DIAGNOSIS — Z823 Family history of stroke: Secondary | ICD-10-CM | POA: Diagnosis not present

## 2017-05-23 DIAGNOSIS — M329 Systemic lupus erythematosus, unspecified: Secondary | ICD-10-CM | POA: Insufficient documentation

## 2017-05-23 DIAGNOSIS — Z91030 Bee allergy status: Secondary | ICD-10-CM | POA: Insufficient documentation

## 2017-05-23 DIAGNOSIS — R51 Headache: Secondary | ICD-10-CM | POA: Diagnosis not present

## 2017-05-23 DIAGNOSIS — R29810 Facial weakness: Secondary | ICD-10-CM | POA: Insufficient documentation

## 2017-05-23 DIAGNOSIS — Z8669 Personal history of other diseases of the nervous system and sense organs: Secondary | ICD-10-CM

## 2017-05-23 DIAGNOSIS — R200 Anesthesia of skin: Secondary | ICD-10-CM | POA: Insufficient documentation

## 2017-05-23 LAB — COMPREHENSIVE METABOLIC PANEL
ALK PHOS: 101 U/L (ref 38–126)
ALT: 21 U/L (ref 14–54)
AST: 29 U/L (ref 15–41)
Albumin: 4.9 g/dL (ref 3.5–5.0)
Anion gap: 11 (ref 5–15)
BILIRUBIN TOTAL: 0.5 mg/dL (ref 0.3–1.2)
BUN: 21 mg/dL — ABNORMAL HIGH (ref 6–20)
CALCIUM: 10 mg/dL (ref 8.9–10.3)
CO2: 25 mmol/L (ref 22–32)
CREATININE: 0.9 mg/dL (ref 0.44–1.00)
Chloride: 105 mmol/L (ref 101–111)
Glucose, Bld: 89 mg/dL (ref 65–99)
Potassium: 4.1 mmol/L (ref 3.5–5.1)
Sodium: 141 mmol/L (ref 135–145)
Total Protein: 8.3 g/dL — ABNORMAL HIGH (ref 6.5–8.1)

## 2017-05-23 LAB — RAPID URINE DRUG SCREEN, HOSP PERFORMED
Amphetamines: NOT DETECTED
BARBITURATES: NOT DETECTED
Benzodiazepines: NOT DETECTED
Cocaine: NOT DETECTED
Opiates: NOT DETECTED
Tetrahydrocannabinol: NOT DETECTED

## 2017-05-23 LAB — URINALYSIS, ROUTINE W REFLEX MICROSCOPIC
BILIRUBIN URINE: NEGATIVE
Glucose, UA: NEGATIVE mg/dL
Hgb urine dipstick: NEGATIVE
Ketones, ur: 5 mg/dL — AB
Leukocytes, UA: NEGATIVE
NITRITE: NEGATIVE
PROTEIN: NEGATIVE mg/dL
SPECIFIC GRAVITY, URINE: 1.02 (ref 1.005–1.030)
pH: 5 (ref 5.0–8.0)

## 2017-05-23 LAB — CBC
HEMATOCRIT: 36.9 % (ref 36.0–46.0)
Hemoglobin: 12.2 g/dL (ref 12.0–15.0)
MCH: 26.7 pg (ref 26.0–34.0)
MCHC: 33.1 g/dL (ref 30.0–36.0)
MCV: 80.7 fL (ref 78.0–100.0)
Platelets: 337 10*3/uL (ref 150–400)
RBC: 4.57 MIL/uL (ref 3.87–5.11)
RDW: 13.3 % (ref 11.5–15.5)
WBC: 9 10*3/uL (ref 4.0–10.5)

## 2017-05-23 LAB — I-STAT CHEM 8, ED
BUN: 22 mg/dL — ABNORMAL HIGH (ref 6–20)
CREATININE: 0.9 mg/dL (ref 0.44–1.00)
Calcium, Ion: 1.17 mmol/L (ref 1.15–1.40)
Chloride: 105 mmol/L (ref 101–111)
GLUCOSE: 93 mg/dL (ref 65–99)
HCT: 38 % (ref 36.0–46.0)
HEMOGLOBIN: 12.9 g/dL (ref 12.0–15.0)
POTASSIUM: 3.8 mmol/L (ref 3.5–5.1)
Sodium: 141 mmol/L (ref 135–145)
TCO2: 28 mmol/L (ref 0–100)

## 2017-05-23 LAB — I-STAT TROPONIN, ED: TROPONIN I, POC: 0 ng/mL (ref 0.00–0.08)

## 2017-05-23 LAB — DIFFERENTIAL
Basophils Absolute: 0 10*3/uL (ref 0.0–0.1)
Basophils Relative: 0 %
Eosinophils Absolute: 0.1 10*3/uL (ref 0.0–0.7)
Eosinophils Relative: 1 %
LYMPHS ABS: 3 10*3/uL (ref 0.7–4.0)
LYMPHS PCT: 33 %
MONO ABS: 0.6 10*3/uL (ref 0.1–1.0)
MONOS PCT: 7 %
NEUTROS ABS: 5.3 10*3/uL (ref 1.7–7.7)
Neutrophils Relative %: 59 %

## 2017-05-23 LAB — PROTIME-INR
INR: 1.04
Prothrombin Time: 13.6 seconds (ref 11.4–15.2)

## 2017-05-23 LAB — APTT: aPTT: 34 seconds (ref 24–36)

## 2017-05-23 MED ORDER — HYDROMORPHONE HCL 1 MG/ML IJ SOLN
1.0000 mg | Freq: Once | INTRAMUSCULAR | Status: AC
  Administered 2017-05-23: 1 mg via INTRAVENOUS
  Filled 2017-05-23: qty 1

## 2017-05-23 MED ORDER — LORAZEPAM 2 MG/ML IJ SOLN
0.5000 mg | Freq: Once | INTRAMUSCULAR | Status: AC
  Administered 2017-05-23: 0.5 mg via INTRAVENOUS
  Filled 2017-05-23: qty 1

## 2017-05-23 MED ORDER — SODIUM CHLORIDE 0.9 % IV BOLUS (SEPSIS)
500.0000 mL | Freq: Once | INTRAVENOUS | Status: AC
  Administered 2017-05-23: 500 mL via INTRAVENOUS

## 2017-05-23 MED ORDER — VALPROATE SODIUM 500 MG/5ML IV SOLN
500.0000 mg | Freq: Once | INTRAVENOUS | Status: AC
  Administered 2017-05-23: 500 mg via INTRAVENOUS
  Filled 2017-05-23: qty 5

## 2017-05-23 MED ORDER — MAGNESIUM SULFATE 2 GM/50ML IV SOLN
2.0000 g | Freq: Once | INTRAVENOUS | Status: AC
  Administered 2017-05-23: 2 g via INTRAVENOUS
  Filled 2017-05-23: qty 50

## 2017-05-23 NOTE — H&P (Signed)
History and Physical    Donna [facility]Jeneria M [facility] [name]:[id number]:[id number] DOB: 07/15/1971 DOA: 05/23/2017  PCP: Patient, No Pcp Per Consultants:  None Patient coming from: Home - lives with daughter; Jackey LogeOK: Sister, [phone]  Chief Complaint: facial numbness and droop  HPI: Donna [name] is a 46 y.o. female with medical history significant of seizures (but none since 2014, not on medications); migraines; lupus (since 2007 but not on any medications for this); and remote DVT presenting with concern for CVA.  The history was obtained in its entirety from her sister because the patient is too ill to provide information. Patient with headaches for a couple of days.  She came to her sister's house today about 7 pm and was complaining of face and left eye numbness.  She told her sister she was "too sick to go to the hospital".  Sister insisted she come in.  [facility] and was complaining about feeling bad.  Sister noticed "it was kinda droopy on the left side" of her face.  Vision loss in the left eye.  Stabbing pain in the top of her head and down her left face and down left arm and leg.  "She felt like her eyeball was going to pop out of its socket."  Depacon and magnesium did not help the pain - but she hallucinated and saw some "really pretty" flloating angels, according to her sister.  Didn't help with pain or numbness.  Received Dilaudid recently and seems to be more comfortable.  She has lupus so her sister wasn't able to tell if she had extremity weakness because she intermittently walks with a foot drag or alternating weakness.  +dysphagia.  Slight slurring in speech.  Kidney infection about a month ago but it seems like it hasn't gotten any better.  Appears to only be going to the bathroom and peeing once a day if at all, according to her sister.  +recent stress - mother in the hospital and her son is in jail.  ED Course: Code stroke activated.  Neuro  consult - complicated migraine vs. CVA.  Given Depacon and Magnesium and MRI.  Symptoms too long for tPA.  Given Dilaudid for pain.  Review of Systems: As per HPI; otherwise  review of systems reviewed and negative.   Ambulatory Status:  Ambulates without assistance  Past Medical History:  Diagnosis Date  . DVT (deep venous thrombosis) (HCC)    right arm  . Endometriosis   . Lupus (systemic lupus erythematosus) (HCC) 2007  . Migraine   . Seizures (HCC)    last sz 06/2013, no meds    Past Surgical History:  Procedure Laterality Date  . ABDOMINAL HYSTERECTOMY     x2 partial  . CESAREAN SECTION     x3  . MASS EXCISION  02/2014   abd    Social History   Social History  . Marital status: Divorced    Spouse name: N/A  . Number of children: 3  . Years of education: N/A   Occupational History  . 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 Smoking status: Never Smoker  . Smokeless tobacco: Never Used  . Alcohol use No  . Drug use: No  . Sexual activity: Not Currently    Birth control/ protection: Surgical   Other Topics Concern  . Not on file   Social History Narrative  . No narrative on file  Allergies  Allergen Reactions  . Bee Venom Anaphylaxis  . Diamox [Acetazolamide] Anaphylaxis  . Nsaids Anaphylaxis  . Omnipaque [Iohexol] Anaphylaxis  . Compazine [Prochlorperazine] Hives  . Iopamidol Hives  . Lactose Intolerance (Gi) Nausea And Vomiting  . Reglan [Metoclopramide] Hives    Family History  Problem Relation Age of Onset  . CVA Mother 29  . Cancer Sister        breast & ovarian  . Cancer Maternal Aunt        breast & ovarian  . Birth defects Maternal Uncle     Prior to Admission medications   Medication Sig Start Date End Date Taking? Authorizing Provider  acetaminophen (TYLENOL) 500 MG tablet Take 500-1,000 mg by mouth every 6 (six) hours as needed for mild pain, moderate pain or headache.    Yes [provider]    albuterol (PROVENTIL HFA;VENTOLIN HFA) 108 (90 Base) MCG/ACT inhaler Inhale 1-2 puffs into the lungs every 6 (six) hours as needed for wheezing or shortness of breath. 01/17/17  Yes Loren Racer, MD  cholecalciferol (VITAMIN D) 1000 units tablet Take 1,000 Units by mouth daily.   Yes [provider]  EPINEPHrine (EPIPEN 2-PAK) 0.3 mg/0.3 mL IJ SOAJ injection Inject 0.3 mg into the muscle once as needed (for severe allergic reaction).   Yes [provider]  vitamin B-12 (CYANOCOBALAMIN) 1000 MCG tablet Take 1,000 mcg by mouth daily.   Yes [provider]  benzonatate (TESSALON) 100 MG capsule Take 1 capsule (100 mg total) by mouth every 8 (eight) hours. Patient not taking: Reported on 05/23/2017 01/17/17   Loren Racer, MD  HYDROcodone-acetaminophen [facility]) 5-325 MG tablet Take 1-2 tablets by mouth every 4 (four) hours as needed for severe pain. Patient not taking: Reported on 05/23/2017 01/17/17   Loren Racer, MD  predniSONE (DELTASONE) 20 MG tablet 3 tabs po day one, then 2 po daily x 4 days Patient not taking: Reported on 05/23/2017 01/17/17   Loren Racer, MD    Physical Exam: Vitals:   05/24/17 0015 05/24/17 0030 05/24/17 0045 05/24/17 0100  BP: 136/72 (!) 128/96 138/74 (!) 153/98  Pulse: (!) 112 (!) 114  (!) 106  Resp: (!) 27 (!) 22 (!) 32 (!) 24  Temp:      TempSrc:      SpO2: 96% 95%  97%  Weight:      Height:         General:  Appears calm and comfortable and is NAD.  Her eyes were closed for the majority of the exam. Eyes:  PERRL, EOMI, normal lids, iris ENT:  grossly normal hearing, lips & tongue, mmm Neck:  no LAD, masses or thyromegaly Cardiovascular:  RRR, no m/r/g. No LE edema.  Respiratory:  CTA bilaterally, no w/r/r. Normal respiratory effort. Abdomen:  soft, ntnd, NABS Skin:  no rash or induration seen on limited exam Musculoskeletal:  grossly normal tone BUE/BLE, good ROM, no bony abnormality Psychiatric:  Flat mood and affect,  speech fluent and appropriate but muted, AOx3 Neurologic:  Her exam was inconsistent.  On tongue thrust, it deviated markedly to the right, quite exaggerated.  She reported numbness along her left face and body.  Inconsistent effort on UE exam but strength appeared to be symmetric when effort was compensated for.  She reports inability to move her left foot and leg at all, yet pushes very hard on the reciprocal leg, indicating a conscious effort to not move the affected limb.  Labs on Admission: I  have personally reviewed following labs and imaging studies  CBC:  Recent Labs Lab 05/23/17 2052 05/23/17 2059  WBC 9.0  --   NEUTROABS 5.3  --   HGB 12.2 12.9  HCT 36.9 38.0  MCV 80.7  --   PLT 337  --    Basic Metabolic Panel:  Recent Labs Lab 05/23/17 2052 05/23/17 2059  NA 141 141  K 4.1 3.8  CL 105 105  CO2 25  --   GLUCOSE 89 93  BUN 21* 22*  CREATININE 0.90 0.90  CALCIUM 10.0  --    GFR: Estimated Creatinine Clearance: 73.8 mL/min (by C-G formula based on SCr of 0.9 mg/dL). Liver Function Tests:  Recent Labs Lab 05/23/17 2052  AST 29  ALT 21  ALKPHOS 101  BILITOT 0.5  PROT 8.3*  ALBUMIN 4.9   No results for input(s): LIPASE, AMYLASE in the last 168 hours. No results for input(s): AMMONIA in the last 168 hours. Coagulation Profile:  Recent Labs Lab 05/23/17 2052  INR 1.04   Cardiac Enzymes: No results for input(s): CKTOTAL, CKMB, CKMBINDEX, TROPONINI in the last 168 hours. BNP (last 3 results) No results for input(s): PROBNP in the last 8760 hours. HbA1C: No results for input(s): HGBA1C in the last 72 hours. CBG: No results for input(s): GLUCAP in the last 168 hours. Lipid Profile: No results for input(s): CHOL, HDL, LDLCALC, TRIG, CHOLHDL, LDLDIRECT in the last 72 hours. Thyroid Function Tests: No results for input(s): TSH, T4TOTAL, FREET4, T3FREE, THYROIDAB in the last 72 hours. Anemia Panel: No results for input(s): VITAMINB12, FOLATE, FERRITIN,  TIBC, IRON, RETICCTPCT in the last 72 hours. Urine analysis:    Component Value Date/Time   COLORURINE YELLOW 05/23/2017 2139   APPEARANCEUR CLEAR 05/23/2017 2139   LABSPEC 1.020 05/23/2017 2139   PHURINE 5.0 05/23/2017 2139   GLUCOSEU NEGATIVE 05/23/2017 2139   HGBUR NEGATIVE 05/23/2017 2139   BILIRUBINUR NEGATIVE 05/23/2017 2139   KETONESUR 5 (A) 05/23/2017 2139   PROTEINUR NEGATIVE 05/23/2017 2139   UROBILINOGEN 0.2 04/14/2015 0300   NITRITE NEGATIVE 05/23/2017 2139   LEUKOCYTESUR NEGATIVE 05/23/2017 2139    Creatinine Clearance: Estimated Creatinine Clearance: 73.8 mL/min (by C-G formula based on SCr of 0.9 mg/dL).  Sepsis Labs: @LABRCNTIP (procalcitonin:4,lacticidven:4) )No results found for this or any previous visit (from the past 240 hour(s)).   Radiological Exams on Admission: Ct Head Code Stroke W/o Cm  Result Date: 05/23/2017 CLINICAL DATA:  Code stroke. Acute onset of left-sided numbness and weakness. Left-sided facial droop. EXAM: CT HEAD WITHOUT CONTRAST TECHNIQUE: Contiguous axial images were obtained from the base of the skull through the vertex without intravenous contrast. COMPARISON:  MRI of the brain and CT of the head performed 02/03/2015 FINDINGS: Brain: No evidence of acute infarction, hemorrhage, hydrocephalus, extra-axial collection or mass lesion/mass effect. The posterior fossa, including the cerebellum, brainstem and fourth ventricle, is within normal limits. The third and lateral ventricles, and basal ganglia are unremarkable in appearance. The cerebral hemispheres are symmetric in appearance, with normal gray-white differentiation. No mass effect or midline shift is seen. Vascular: No hyperdense vessel or unexpected calcification. Skull: There is no evidence of fracture; visualized osseous structures are unremarkable in appearance. Sinuses/Orbits: The orbits are within normal limits. The paranasal sinuses and mastoid air cells are well-aerated. Other: No  significant soft tissue abnormalities are seen. ASPECTS [facility] Stroke Program Early CT Score) - Ganglionic level infarction (caudate, lentiform nuclei, internal capsule, insula, M1-M3 cortex): 7 - Supraganglionic infarction (M4-M6 cortex): 3 Total  score (0-10 with 10 being normal): 10 IMPRESSION: 1. Unremarkable noncontrast CT of the head. 2. ASPECTS is 10 points These results were called by telephone at the time of interpretation on 05/23/2017 at 9:23 pm to Dr. Lynden Oxford, who verbally acknowledged these results. Electronically Signed   By: Roanna Raider M.D.   On: 05/23/2017 21:23    EKG: Independently reviewed.  NSR with rate 91;  no evidence of acute ischemia  Assessment/Plan Principal Problem:   TIA (transient ischemic attack)   -This appears to be very unlikely to be TIA or stroke syndrome based on very inconsistent exam at this time -However, her reported symptoms particularly in conjunction with report of mother having an early CVA and the patient having SLE raises concern for TIA/CVA -Will place in observation status for CVA/TIA evaluation -Telemetry monitoring -MRI -If MRI is negative, additional evaluation is likely not indicated at this time -Additional considerations for DDX include complex migraine and psychogenic cause for vague and inconsistent neurologic symptoms. -Will hold Carotid dopplers and Echo for now -Risk stratification with FLP; will also check TSH and UDS (negative) -No ASA due to reported anaphylactic allergy -Allow permissive HTN (does not acknowledge h/o HTN but BP is elevated, will follow) -Treat BP only if >220/120, and then with goal of 15% reduction -Reports h/o SLE but is not taking any controller medications; if she truly has this condition, she likely needs referral to rheumatology. -Will request CM consult for assistance in obtaining a PCP.   DVT prophylaxis:  Lovenox  Code Status:  Full - confirmed with patient/family Family Communication:  Sister present throughout evaluation Disposition Plan:  Home once clinically improved Consults called: Neurology  Admission status: It is my clinical opinion that referral for OBSERVATION is reasonable and necessary in this patient based on the above information provided. The aforementioned taken together are felt to place the patient at high risk for further clinical deterioration. However it is anticipated that the patient may be medically stable for discharge from the hospital within 24 to 48 hours.    Jonah Blue MD [facility]  If 7PM-7AM, please contact night-coverage [email or web address] Password [email or web address]  05/24/2017, 1:14 AM

## 2017-05-23 NOTE — Consult Note (Signed)
Neurology Consultation Reason for Consult: Left sided weakness Referring Physician: Tegeler, C  CC: Left-sided weakness  History is obtained from: Patient  HPI: Donna [name] is a 46 y.o. female with a history of lupus, migraine who presents with abnormal movements, left-sided weakness, headache. She has been under a lot of stress recently due to multiple factors including someone in the hospital. On arrival, she was not getting good effort with her left side and a code stroke was called. 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   She states that the thing that she noticed first was that she had numbness in her left hand, then she began getting a headache and then subsequently became progressively more weak and began having abnormal movements of her right side.   LKW: 2 PM tpa given?: no, Outside of window    ROS: A 14 point ROS was performed and is negative except as noted in the HPI.   Past Medical History:  Diagnosis Date  . DVT (deep venous thrombosis) (HCC)    right arm  . Endometriosis   . Lupus   . Lupus (systemic lupus erythematosus) (HCC) 2007  . Lupus (systemic lupus erythematosus) (HCC) 2007  . Migraine   . Seizure disorder (HCC)   . Seizures (HCC)    last sz 06/2013, no meds     Family History  Problem Relation Age of Onset  . Cancer Sister        breast & ovarian  . Cancer Maternal Aunt        breast & ovarian  . Birth defects Maternal Uncle      Social History:  reports that she has never smoked. She has never used smokeless tobacco. She reports that she does not drink alcohol or use drugs.   Exam: Current vital signs: BP 134/90   Pulse 99   Temp 98.1 F (36.7 C)   Resp 20   Ht 5\' 3"  (1.6 m)   Wt 72.6 kg (160 lb)   LMP 06/14/2003   SpO2 99%   BMI 28.34 kg/m  Vital signs in last 24 hours: Temp:  [98.1 F (36.7 C)] 98.1 F  (36.7 C) (06/10 2057) Pulse Rate:  [90-108] 99 (06/10 2230) Resp:  [18-25] 20 (06/10 2230) BP: (132-151)/(74-105) 134/90 (06/10 2230) SpO2:  [99 %-100 %] 99 % (06/10 2230) Weight:  [72.6 kg (160 lb)] 72.6 kg (160 lb) (06/10 2025)   Physical Exam  Constitutional: Appears well-developed and well-nourished.  Psych: Appears anxious  Eyes: No scleral injection HENT: No OP obstrucion Head: Normocephalic.  Cardiovascular: Normal rate and regular rhythm.  Respiratory: she is hyperventilating  GI: Soft.  No distension. There is no tenderness.  Skin: WDI  Neuro: Mental Status: Patient is awake, alert, oriented to person, place, month, year, and situation. Patient is able to give a clear and coherent history. No signs of aphasia or neglect Cranial Nerves: II: Visual Fields are full. Pupils are equal, round, and reactive to light.   III,IV, VI: EOMI without ptosis or diploplia.  V: Facial sensation is decreased on the left side  VII: Facial movement is  variably decreased on the left side, sometimes not at all and sometimes with weakness  VIII: hearing is intact to voice X: Uvula elevates symmetrically XI: Shoulder shrug is symmetric. XII: tongue  deviates strongly(as if intentionally) to the right  Motor: She does not  give good effort on the left side, but has a markedly inconsistent exam, with at least 4/5 strength in both the arm and leg Sensory: Sensation is diminished throughout the left side Cerebellar: She has abnormal movements throughout the right side which are very clearly distractible with contralateral movements. 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 Some of the things that she described do sound concerning for complicated migraine, and it may  be that she has some embellishment superimposed on this. With her history of lupus, if she does not improve, then it may be reasonable to get an MRI of the brain, but I think that physiologic dysfunction is relatively unlikely. Unfortunately she is allergic to multiple medications which are typically used in the treatment of migraine and therefore second line therapies will be used.  Recommendations: 1)  Magnesium, Depakote, fluid for migraine 2) if no improvement, then I would pursue brain MRI to rule out physiologic illness with superimposed embellishment   Ritta Slot, MD [facility] [phone]  If 7pm- 7am, please page neurology on call as listed in AMION.

## 2017-05-23 NOTE — ED Triage Notes (Signed)
Left sided numbness and weakness and left side of face started about 40 minutes ago with drift on left arm and facial droop on left side of face.
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
## 2017-05-24 ENCOUNTER — Encounter: Payer: Self-pay | Admitting: Neurology

## 2017-05-24 ENCOUNTER — Observation Stay (HOSPITAL_COMMUNITY): Payer: BLUE CROSS/BLUE SHIELD

## 2017-05-24 DIAGNOSIS — R51 Headache: Secondary | ICD-10-CM | POA: Diagnosis not present

## 2017-05-24 DIAGNOSIS — G43109 Migraine with aura, not intractable, without status migrainosus: Secondary | ICD-10-CM | POA: Diagnosis not present

## 2017-05-24 DIAGNOSIS — G459 Transient cerebral ischemic attack, unspecified: Secondary | ICD-10-CM | POA: Diagnosis present

## 2017-05-24 DIAGNOSIS — R200 Anesthesia of skin: Secondary | ICD-10-CM

## 2017-05-24 DIAGNOSIS — Z8669 Personal history of other diseases of the nervous system and sense organs: Secondary | ICD-10-CM

## 2017-05-24 DIAGNOSIS — R531 Weakness: Secondary | ICD-10-CM | POA: Diagnosis not present

## 2017-05-24 DIAGNOSIS — M329 Systemic lupus erythematosus, unspecified: Secondary | ICD-10-CM

## 2017-05-24 LAB — CBC WITH DIFFERENTIAL/PLATELET
BASOS ABS: 0 10*3/uL (ref 0.0–0.1)
BASOS PCT: 1 %
EOS ABS: 0.1 10*3/uL (ref 0.0–0.7)
Eosinophils Relative: 2 %
HEMATOCRIT: 35.5 % — AB (ref 36.0–46.0)
Hemoglobin: 11.5 g/dL — ABNORMAL LOW (ref 12.0–15.0)
Lymphocytes Relative: 37 %
Lymphs Abs: 1.9 10*3/uL (ref 0.7–4.0)
MCH: 26.4 pg (ref 26.0–34.0)
MCHC: 32.4 g/dL (ref 30.0–36.0)
MCV: 81.6 fL (ref 78.0–100.0)
MONO ABS: 0.3 10*3/uL (ref 0.1–1.0)
Monocytes Relative: 5 %
NEUTROS ABS: 2.9 10*3/uL (ref 1.7–7.7)
Neutrophils Relative %: 55 %
PLATELETS: 313 10*3/uL (ref 150–400)
RBC: 4.35 MIL/uL (ref 3.87–5.11)
RDW: 13.6 % (ref 11.5–15.5)
WBC: 5.2 10*3/uL (ref 4.0–10.5)

## 2017-05-24 LAB — LIPID PANEL
Cholesterol: 156 mg/dL (ref 0–200)
HDL: 69 mg/dL (ref >40)
LDL CALC: 78 mg/dL (ref 0–99)
TRIGLYCERIDES: 44 mg/dL (ref <150)
Total CHOL/HDL Ratio: 2.3 RATIO
VLDL: 9 mg/dL (ref 0–40)

## 2017-05-24 LAB — COMPREHENSIVE METABOLIC PANEL
ALBUMIN: 4.2 g/dL (ref 3.5–5.0)
ALT: 19 U/L (ref 14–54)
AST: 24 U/L (ref 15–41)
Alkaline Phosphatase: 93 U/L (ref 38–126)
Anion gap: 8 (ref 5–15)
BUN: 16 mg/dL (ref 6–20)
CHLORIDE: 104 mmol/L (ref 101–111)
CO2: 26 mmol/L (ref 22–32)
Calcium: 8.9 mg/dL (ref 8.9–10.3)
Creatinine, Ser: 0.76 mg/dL (ref 0.44–1.00)
GFR calc Af Amer: >60 mL/min (ref >60)
GFR calc non Af Amer: >60 mL/min (ref >60)
GLUCOSE: 119 mg/dL — AB (ref 65–99)
POTASSIUM: 3.8 mmol/L (ref 3.5–5.1)
SODIUM: 138 mmol/L (ref 135–145)
Total Bilirubin: 0.7 mg/dL (ref 0.3–1.2)
Total Protein: 7.3 g/dL (ref 6.5–8.1)

## 2017-05-24 LAB — TROPONIN I
Troponin I: <0.03 ng/mL (ref <0.03)
Troponin I: <0.03 ng/mL (ref <0.03)

## 2017-05-24 LAB — HIV ANTIBODY (ROUTINE TESTING W REFLEX): HIV SCREEN 4TH GENERATION: NONREACTIVE

## 2017-05-24 LAB — MAGNESIUM: Magnesium: 2.3 mg/dL (ref 1.7–2.4)

## 2017-05-24 LAB — PHOSPHORUS: Phosphorus: 4.1 mg/dL (ref 2.5–4.6)

## 2017-05-24 MED ORDER — SENNOSIDES-DOCUSATE SODIUM 8.6-50 MG PO TABS: 1.0000 | ORAL_TABLET | Freq: Every evening | ORAL | 0 refills | Status: DC | PRN

## 2017-05-24 MED ORDER — ONDANSETRON HCL 4 MG/2ML IJ SOLN
4.0000 mg | Freq: Four times a day (QID) | INTRAMUSCULAR | Status: DC | PRN
  Administered 2017-05-24: 4 mg via INTRAVENOUS
  Filled 2017-05-24: qty 2

## 2017-05-24 MED ORDER — ACETAMINOPHEN 325 MG PO TABS: 650.0000 mg | ORAL_TABLET | Freq: Four times a day (QID) | ORAL | Status: DC | PRN

## 2017-05-24 MED ORDER — DIVALPROEX SODIUM ER 500 MG PO TB24
500.0000 mg | ORAL_TABLET | Freq: Every day | ORAL | Status: DC
  Administered 2017-05-24: 500 mg via ORAL
  Filled 2017-05-24: qty 1

## 2017-05-24 MED ORDER — ACETAMINOPHEN 325 MG PO TABS: 650.0000 mg | ORAL_TABLET | ORAL | Status: DC | PRN

## 2017-05-24 MED ORDER — DIVALPROEX SODIUM ER 500 MG PO TB24: 500.0000 mg | ORAL_TABLET | Freq: Every day | ORAL | 0 refills | Status: DC

## 2017-05-24 MED ORDER — SODIUM CHLORIDE 0.9 % IV SOLN
INTRAVENOUS | Status: DC
  Administered 2017-05-24: 02:00:00 via INTRAVENOUS

## 2017-05-24 MED ORDER — STROKE: EARLY STAGES OF RECOVERY BOOK
Freq: Once | Status: DC
Start: 2017-05-24 — End: 2017-05-24

## 2017-05-24 MED ORDER — ENOXAPARIN SODIUM 40 MG/0.4ML ~~LOC~~ SOLN: 40.0000 mg | Freq: Every day | SUBCUTANEOUS | Status: DC

## 2017-05-24 MED ORDER — ACETAMINOPHEN 650 MG RE SUPP: 650.0000 mg | RECTAL | Status: DC | PRN

## 2017-05-24 MED ORDER — HYDROCODONE-ACETAMINOPHEN 5-325 MG PO TABS
1.0000 | ORAL_TABLET | ORAL | Status: DC | PRN
  Administered 2017-05-24 (×4): 2 via ORAL
  Filled 2017-05-24 (×4): qty 2

## 2017-05-24 MED ORDER — HYDROCODONE-ACETAMINOPHEN 5-325 MG PO TABS: 1.0000 | ORAL_TABLET | ORAL | 0 refills | Status: DC | PRN

## 2017-05-24 MED ORDER — ENOXAPARIN SODIUM 40 MG/0.4ML ~~LOC~~ SOLN
40.0000 mg | Freq: Every day | SUBCUTANEOUS | Status: DC
  Administered 2017-05-24: 40 mg via SUBCUTANEOUS
  Filled 2017-05-24: qty 0.4

## 2017-05-24 MED ORDER — ACETAMINOPHEN 160 MG/5ML PO SOLN: 650.0000 mg | ORAL | Status: DC | PRN

## 2017-05-24 MED ORDER — PROMETHAZINE HCL 25 MG/ML IJ SOLN
12.5000 mg | Freq: Once | INTRAMUSCULAR | Status: AC
  Administered 2017-05-24: 12.5 mg via INTRAVENOUS
  Filled 2017-05-24: qty 1

## 2017-05-24 MED ORDER — SENNOSIDES-DOCUSATE SODIUM 8.6-50 MG PO TABS: 1.0000 | ORAL_TABLET | Freq: Every evening | ORAL | Status: DC | PRN

## 2017-05-24 MED ORDER — LORAZEPAM 1 MG PO TABS
1.0000 mg | ORAL_TABLET | Freq: Once | ORAL | Status: AC | PRN
  Administered 2017-05-24: 1 mg via ORAL
  Filled 2017-05-24: qty 1

## 2017-05-24 NOTE — Progress Notes (Signed)
PT Cancellation Note  Patient Details Name: Louis MatteJeneria M Johnson MRN: [id number] DOB: 12/05/1971   Cancelled Treatment:    Reason Eval/Treat Not Completed: Medical issues which prohibited therapy (pt reports nausea, feels she'd vomit if she attempted mobility. RN notified, will attempt later today. )   Tamala SerUhlenberg, Ashlee Bewley Kistler 05/24/2017, 11:22 AM [phone]

## 2017-05-24 NOTE — Evaluation (Signed)
Physical Therapy Evaluation Patient Details Name: Donna [name] MRN: [id number] DOB: 05/24/1971 Today's Date: 05/24/2017   History of Present Illness  46 y.o. female with a history of lupus, migraine who presents with abnormal movements, left-sided weakness, headache. She has been under a lot of stress recently due to multiple factors including someone in the hospital.  Also per neurology note: "On my evaluation, there are multiple factors most consistent with psychogenic etiology and she was outside the window for IV TPA and TPA was not administered"  Clinical Impression  Pt admitted with above diagnosis. Pt currently with functional limitations due to the deficits listed below (see PT Problem List). Pt will benefit from skilled PT to increase their independence and safety with mobility to allow discharge to the venue listed below.  Pt inconsistent with MMT however presents with L sided weakness with reported pain in L side of face/head which radiates down L side of her body.  Pt able to ambulate with RW and reports she has 2 RW at home (which are her mother's).  Pt reports 24/7 assist available at home.  Pt agreeable to f/u HHPT as she reports d/c home likely later today.     Follow Up Recommendations Home health PT;Supervision/Assistance - 24 hour    Equipment Recommendations  None recommended by PT    Recommendations for Other Services       Precautions / Restrictions Precautions Precautions: Fall      Mobility  Bed Mobility Overal bed mobility: Modified Independent             General bed mobility comments: increased time  Transfers Overall transfer level: Needs assistance Equipment used: Rolling walker (2 wheeled) Transfers: Sit to/from Stand Sit to Stand: Min guard         General transfer comment: increased time and effort, more reliance on R side to self assist  Ambulation/Gait Ambulation/Gait assistance: Min guard Ambulation Distance (Feet): 160  Feet Assistive device: Rolling walker (2 wheeled) Gait Pattern/deviations: Step-to pattern;Step-through pattern;Decreased stance time - left     General Gait Details: initially presented with step to pattern and decreased stance on L LE however this improved with distance, slow gait, steady with RW, occasional standing rest breaks due to L head pain and UE tremoring  Stairs            Wheelchair Mobility    Modified Rankin (Stroke Patients Only)       Balance Overall balance assessment: Needs assistance (no hx of falls)         Standing balance support: Bilateral upper extremity supported Standing balance-Leahy Scale: Poor Standing balance comment: requiring UE support                             Pertinent Vitals/Pain Pain Assessment: 0-10 Pain Score: 7  Pain Location: L sided facial/eye pain that "radiates" down L side of body Pain Descriptors / Indicators: Sharp Pain Intervention(s): Limited activity within patient's tolerance;Repositioned;Monitored during session    Home Living Family/patient expects to be discharged to:: Private residence Living Arrangements: Children;Spouse/significant other Available Help at Discharge: Family;Available 24 hours/day   Home Access: Stairs to enter Entrance Stairs-Rails: None Entrance Stairs-Number of Steps: 3 Home Layout: One level Home Equipment: Walker - 2 wheels      Prior Function Level of Independence: Independent         Comments: walks without AD, stands to shower (reports bathing/dressing are labored at times)  Hand Dominance        Extremity/Trunk Assessment   Upper Extremity Assessment Upper Extremity Assessment: LUE deficits/detail (Reports numbness and tingling down L side of body) LUE Deficits / Details: not able to perform full AROM, poor grip    Lower Extremity Assessment Lower Extremity Assessment: RLE deficits/detail RLE Deficits / Details: grossly at least 3+/5        Communication   Communication: No difficulties  Cognition Arousal/Alertness: Awake/alert Behavior During Therapy: WFL for tasks assessed/performed Overall Cognitive Status: Within Functional Limits for tasks assessed                                        General Comments      Exercises     Assessment/Plan    PT Assessment Patient needs continued PT services  PT Problem List Decreased strength;Decreased mobility;Decreased activity tolerance;Decreased knowledge of use of DME;Pain       PT Treatment Interventions DME instruction;Gait training;Therapeutic exercise;Therapeutic activities;Patient/family education;Stair training;Functional mobility training    PT Goals (Current goals can be found in the Care Plan section)  Acute Rehab PT Goals PT Goal Formulation: With patient Time For Goal Achievement: 05/31/17 Potential to Achieve Goals: Good    Frequency Min 3X/week   Barriers to discharge        Co-evaluation               AM-PAC PT "6 Clicks" Daily Activity  Outcome Measure Difficulty turning over in bed (including adjusting bedclothes, sheets and blankets)?: None Difficulty moving from lying on back to sitting on the side of the bed? : None Difficulty sitting down on and standing up from a chair with arms (e.g., wheelchair, bedside commode, etc,.)?: A Little Help needed moving to and from a bed to chair (including a wheelchair)?: A Little Help needed walking in hospital room?: A Little Help needed climbing 3-5 steps with a railing? : A Little 6 Click Score: 20    End of Session Equipment Utilized During Treatment: Gait belt Activity Tolerance: Patient tolerated treatment well Patient left: in chair;with call bell/phone within reach   PT Visit Diagnosis: Other abnormalities of gait and mobility (R26.89)    Time: [id number] PT Time Calculation (min) (ACUTE ONLY): 21 min   Charges:   PT Evaluation $PT Eval Moderate Complexity: 1  Procedure     PT G Codes:   PT G-Codes **NOT FOR INPATIENT CLASS** Functional Assessment Tool Used: AM-PAC 6 Clicks Basic Mobility Functional Limitation: Mobility: Walking and moving around Mobility: Walking and Moving Around Current Status ([id number](G8978): At least 20 percent but less than 40 percent impaired, limited or restricted Mobility: Walking and Moving Around Goal Status [phone](G8979): At least 1 percent but less than 20 percent impaired, limited or restricted   Zenovia JarredKati Girtie Wiersma, PT, DPT 05/24/2017 Pager: [phone]   Maida SaleLEMYRE,KATHrine E 05/24/2017, 2:39 PM

## 2017-05-24 NOTE — Progress Notes (Signed)
OT Cancellation Note  Patient Details Name: Donna [name] MRN: [id number] DOB: 11/14/1971   Cancelled Treatment:    Reason Eval/Treat Not Completed: Pain limiting ability to participate RN notified.  Pt sitting in chair with head on bedside table on a towel.  Pt states pain in L side of neck and shoulder.  Noted pt with DC orders. [facility]  To ensure safety with ADL activity .  Lise AuerLori Melodie Ashworth, [place] [phone]  Einar CrowEDDING, Vinicius Brockman D 05/24/2017, 3:52 PM

## 2017-05-24 NOTE — Progress Notes (Signed)
[facility]. Could not schedule any appointments for pt at a PCP. [facility] and [facility] for HH[facility]. Pt is aware of appointment and [facility] for HHPT. [facility]

## 2017-05-24 NOTE — Discharge Summary (Signed)
Physician Discharge Summary  Donna [name] [facility] EAV:[id number] DOB: [birth year] DOA: 05/23/2017  PCP: Patient, No Pcp Per  Admit date: 05/23/2017 Discharge date: 05/24/2017  Admitted From: Home Disposition:  Home with Home Health  Recommendations for Outpatient Follow-up:  1. Follow up with and establish with PCP in 1-2 weeks; Appointment made for you on 05/31/17 at 2:00 pm 2. Follow up with [facility] on 08/27/17 at 7:45 AM 3. Please obtain CMP/CBC, Mag, Phos in one week  Home Health: YES Equipment/Devices: None recommended by PT    Discharge Condition: Stable CODE STATUS: FULL CODE  Diet recommendation: Heart Healthy  Brief/Interim Summary: Donna [name] is a 46 y.o. female with medical history significant of seizures (but none since 2014, not on medications); migraines; lupus (since 2007 but not on any medications for this); and remote DVT presenting with concern for CVA.  The history was obtained in its entirety from her sister because the patient was too ill to provide information. Patient with headaches for a couple of days.  She came to her sister's house today about 7 pm and was complaining of face and left eye numbness.  She told her sister she was "too sick to go to the hospital".  Sister insisted she come in.  She had previously been at [facility] with her mother earlier in the day and was complaining about feeling bad.  Sister noticed "it was kinda droopy on the left side" of her face.  Vision loss in the left eye.  Stabbing pain in the top of her head and down her left face and down left arm and leg.  "She felt like her eyeball was going to pop out of its socket."  Depacon and magnesium did not help the pain - but she hallucinated and saw some "really pretty" flloating angels, according to her sister.  Didn't help with pain or numbness.  Received Dilaudid recently and seems to be more comfortable.  She has lupus so her sister wasn't able to tell if she had extremity weakness because she  intermittently walks with a foot drag or alternating weakness.+dysphagia.  Slight slurring in speech. She had a Kidney infection about a month ago but it seems like it hasn't gotten any better.  Appears to only be going to the bathroom and peeing once a day if at all, according to her sister. She has had  +recent stress - mother in the hospital and her son is in jail. In the ED Code stroke activated.  Neuro consult - complicated migraine vs. CVA.  Given Depacon and Magnesium and MRI.  Symptoms too long for tPA.  Given Dilaudid for pain. MRI was negative and patient felt a little better today. PT evaluated her and recommended Home Health PT. Patient's labs remained stable and she was deemed medically stable to D/C Home where she will follow up with PCP (Sickle Cell scheduled) and Neurology Dr. Everlena Cooper as an outpatient.   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 given Stressors  -AppearEd to be very unlikely to be TIA or stroke syndrome based on very inconsistent exam at this time -However, her reported symptoms particularly in conjunction with report of mother having an early CVA and the patient having SLE raised concern for TIA/CVA -Was place in observation status for CVA/TIA evaluation but ruled out -Telemetry monitoring -MRI showed Normal MR of the Brain -Will hold Carotid dopplers and Echo for now and can  be pursued as an outpatient if patient has recurrent symptoms -Risk stratification with FLP (Normal) and UDS (negative) -No ASA due to reported anaphylactic allergy -Allowed permissive HTN (does not acknowledge h/o HTN but BP is elevated, will follow) -Reported h/o SLE but is not taking any controller medications; if she truly has this condition -Will request CM consult for assistance in obtaining a PCP. -C/w Symptomatic Management for Migraine -Given Depakote at D/C  -Follow  up with Neurology as an outpatient scheduled -Will need to follow up with PCP  Lupus -Needs to follow up with [facility]  Hx of Seizures -Follow up with Neurology; Appointment made  -C/w Depakote 500 mg po Daily for Migraine Prophylaxis -Will need to Follow up with PCP and Neurology at D/C  Discharge Instructions  Discharge Instructions    Call MD for:  difficulty breathing, headache or visual disturbances    Complete by:  As directed    Call MD for:  extreme fatigue    Complete by:  As directed    Call MD for:  hives    Complete by:  As directed    Call MD for:  persistant dizziness or light-headedness    Complete by:  As directed    Call MD for:  persistant nausea and vomiting    Complete by:  As directed    Call MD for:  severe uncontrolled pain    Complete by:  As directed    Call MD for:  temperature >100.4    Complete by:  As directed    Diet - low sodium heart healthy    Complete by:  As directed    Discharge instructions    Complete by:  As directed    Follow up with PCP and Neurology as an outpatient. Take all medications as prescribed. If symptoms change or worsen please return to the ED for evaluation.   Increase activity slowly    Complete by:  As directed      Allergies as of 05/24/2017      Reactions   Bee Venom Anaphylaxis   Diamox [acetazolamide] Anaphylaxis   Nsaids Anaphylaxis   Omnipaque [iohexol] Anaphylaxis   Compazine [prochlorperazine] Hives   Iopamidol Hives   Lactose Intolerance (gi) Nausea And Vomiting   Reglan [metoclopramide] Hives      Medication List    TAKE these medications   acetaminophen 500 MG tablet Commonly known as:  TYLENOL Take 500-1,000 mg by mouth every 6 (six) hours as needed for mild pain, moderate pain or headache.   albuterol 108 (90 Base) MCG/ACT inhaler Commonly known as:  PROVENTIL HFA;VENTOLIN HFA Inhale 1-2 puffs into the lungs every 6 (six) hours as needed for wheezing or shortness of breath.    cholecalciferol 1000 units tablet Commonly known as:  VITAMIN D Take 1,000 Units by mouth daily.   divalproex 500 MG 24 hr tablet Commonly known as:  DEPAKOTE ER Take 1 tablet (500 mg total) by mouth daily. Start taking on:  05/25/2017   EPIPEN 2-PAK 0.3 mg/0.3 mL Soaj injection Generic drug:  EPINEPHrine Inject 0.3 mg into the muscle once as needed (for severe allergic reaction).   HYDROcodone-acetaminophen 5-325 MG tablet Commonly known as:  NORCO/VICODIN Take 1-2 tablets by mouth every 4 (four) hours as needed for moderate pain or severe pain.   senna-docusate 8.6-50 MG tablet Commonly known as:  Senokot-S Take 1 tablet by mouth at bedtime as needed for mild constipation.   vitamin B-12 1000 MCG tablet Commonly known  as:  CYANOCOBALAMIN Take 1,000 mcg by mouth daily.      Follow-up Information    [facility] Follow up on 05/31/2017.   Specialty:  Internal Medicine Why:  Appointment on Monday 05/31/17 at 2:00 PM. Please keep appointment. Contact information: [address] [place] [address] [phone]       [facility]. Go in 1 month(s).   Why:  Appoinment scheudled for you for September 14th, 18 at 7:45 AM Contact information: [address] [place], [address] [place] [address] [phone]         Allergies  Allergen Reactions  . Bee Venom Anaphylaxis  . Diamox [Acetazolamide] Anaphylaxis  . Nsaids Anaphylaxis  . Omnipaque [Iohexol] Anaphylaxis  . Compazine [Prochlorperazine] Hives  . Iopamidol Hives  . Lactose Intolerance (Gi) Nausea And Vomiting  . Reglan [Metoclopramide] Hives   Consultations:  Neurology Dr. Amada Jupiter  Procedures/Studies: Mr Brain Wo Contrast  Result Date: 05/24/2017 CLINICAL DATA:  Headache, left-sided numbness and weakness. History of lupus and migraine EXAM: MRI HEAD WITHOUT CONTRAST TECHNIQUE: Multiplanar, multiecho pulse sequences of the brain and surrounding  structures were obtained without intravenous contrast. COMPARISON:  CT head 05/23/2017, MRI 02/03/2015 FINDINGS: Brain: No acute infarction, hemorrhage, hydrocephalus, extra-axial collection or mass lesion. Vascular: Normal flow voids. Skull and upper cervical spine: Negative Sinuses/Orbits: Mild mucosal edema in the ethmoid sinuses. Normal orbit. Other: None IMPRESSION: Normal MRI of the brain. Electronically Signed   By: Marlan Palau M.D.   On: 05/24/2017 07:50   Ct Head Code Stroke W/o Cm  Result Date: 05/23/2017 CLINICAL DATA:  Code stroke. Acute onset of left-sided numbness and weakness. Left-sided facial droop. EXAM: CT HEAD WITHOUT CONTRAST TECHNIQUE: Contiguous axial images were obtained from the base of the skull through the vertex without intravenous contrast. COMPARISON:  MRI of the brain and CT of the head performed 02/03/2015 FINDINGS: Brain: No evidence of acute infarction, hemorrhage, hydrocephalus, extra-axial collection or mass lesion/mass effect. The posterior fossa, including the cerebellum, brainstem and fourth ventricle, is within normal limits. The third and lateral ventricles, and basal ganglia are unremarkable in appearance. The cerebral hemispheres are symmetric in appearance, with normal gray-white differentiation. No mass effect or midline shift is seen. Vascular: No hyperdense vessel or unexpected calcification. Skull: There is no evidence of fracture; visualized osseous structures are unremarkable in appearance. Sinuses/Orbits: The orbits are within normal limits. The paranasal sinuses and mastoid air cells are well-aerated. Other: No significant soft tissue abnormalities are seen. ASPECTS [facility] Stroke Program Early CT Score) - Ganglionic level infarction (caudate, lentiform nuclei, internal capsule, insula, M1-M3 cortex): 7 - Supraganglionic infarction (M4-M6 cortex): 3 Total score (0-10 with 10 being normal): 10 IMPRESSION: 1. Unremarkable noncontrast CT of the head. 2.  ASPECTS is 10 points These results were called by telephone at the time of interpretation on 05/23/2017 at 9:23 pm to Dr. Lynden Oxford, who verbally acknowledged these results. Electronically Signed   By: Roanna Raider M.D.   On: 05/23/2017 21:23     Subjective: Seen and examined and had some nausea. States symptoms were improved and happy that she did not have a CVA or TIA. Wanting to go home.   Discharge Exam: Vitals:   05/24/17 1242 05/24/17 1455  BP: (!) 125/91 124/82  Pulse: 79 79  Resp: 16 15  Temp: 97.8 F (36.6 C) 98.1 F (36.7 C)   Vitals:   05/24/17 0837 05/24/17 1043 05/24/17 1242 05/24/17 1455  BP: 125/73  102/90 (!) 125/91 124/82  Pulse: 88 95 79 79  Resp: 16 16 16 15   Temp: 97.7 F (36.5 C) 98.1 F (36.7 C) 97.8 F (36.6 C) 98.1 F (36.7 C)  TempSrc: Oral Oral Oral Oral  SpO2: 100% 97% 100% 99%  Weight:      Height:       General: Pt is alert, awake but a little groggy, not in acute distress; Cardiovascular: RRR, S1/S2 +, no rubs, no gallops Respiratory: CTA bilaterally, no wheezing, no rhonchi Abdominal: Soft, NT, ND, bowel sounds + Extremities: no edema, no cyanosis  The results of significant diagnostics from this hospitalization (including imaging, microbiology, ancillary and laboratory) are listed below for reference.    Microbiology: No results found for this or any previous visit (from the past 240 hour(s)).   Labs: BNP (last 3 results)  Recent Labs  10/17/16 2052  BNP 13.8   Basic Metabolic Panel:  Recent Labs Lab 05/23/17 2052 05/23/17 2059 05/24/17 0829  NA 141 141 138  K 4.1 3.8 3.8  CL 105 105 104  CO2 25  --  26  GLUCOSE 89 93 119*  BUN 21* 22* 16  CREATININE 0.90 0.90 0.76  CALCIUM 10.0  --  8.9  MG  --   --  2.3  PHOS  --   --  4.1   Liver Function Tests:  Recent Labs Lab 05/23/17 2052 05/24/17 0829  AST 29 24  ALT 21 19  ALKPHOS 101 93  BILITOT 0.5 0.7  PROT 8.3* 7.3  ALBUMIN 4.9 4.2   No results for  input(s): LIPASE, AMYLASE in the last 168 hours. No results for input(s): AMMONIA in the last 168 hours. CBC:  Recent Labs Lab 05/23/17 2052 05/23/17 2059 05/24/17 0829  WBC 9.0  --  5.2  NEUTROABS 5.3  --  2.9  HGB 12.2 12.9 11.5*  HCT 36.9 38.0 35.5*  MCV 80.7  --  81.6  PLT 337  --  313   Cardiac Enzymes:  Recent Labs Lab 05/24/17 0159 05/24/17 0829 05/24/17 1316  TROPONINI <0.03 <0.03 <0.03   BNP: Invalid input(s): POCBNP CBG: No results for input(s): GLUCAP in the last 168 hours. D-Dimer No results for input(s): DDIMER in the last 72 hours. Hgb A1c No results for input(s): HGBA1C in the last 72 hours. Lipid Profile  Recent Labs  05/24/17 0159  CHOL 156  HDL 69  LDLCALC 78  TRIG 44  CHOLHDL 2.3   Thyroid function studies No results for input(s): TSH, T4TOTAL, T3FREE, THYROIDAB in the last 72 hours.  Invalid input(s): FREET3 Anemia work up No results for input(s): VITAMINB12, FOLATE, FERRITIN, TIBC, IRON, RETICCTPCT in the last 72 hours. Urinalysis    Component Value Date/Time   COLORURINE YELLOW 05/23/2017 2139   APPEARANCEUR CLEAR 05/23/2017 2139   LABSPEC 1.020 05/23/2017 2139   PHURINE 5.0 05/23/2017 2139   GLUCOSEU NEGATIVE 05/23/2017 2139   HGBUR NEGATIVE 05/23/2017 2139   BILIRUBINUR NEGATIVE 05/23/2017 2139   KETONESUR 5 (A) 05/23/2017 2139   PROTEINUR NEGATIVE 05/23/2017 2139   UROBILINOGEN 0.2 04/14/2015 0300   NITRITE NEGATIVE 05/23/2017 2139   LEUKOCYTESUR NEGATIVE 05/23/2017 2139   Sepsis Labs Invalid input(s): PROCALCITONIN,  WBC,  LACTICIDVEN Microbiology No results found for this or any previous visit (from the past 240 hour(s)).  Time coordinating discharge: 35 minutes  SIGNED:  Merlene Laughtermair Latif Almee Pelphrey, DO [facility] 05/24/2017, 3:28 PM Pager [phone]  If 7PM-7AM, please contact night-coverage [email or web address] Password [email or web address]

## 2017-05-31 ENCOUNTER — Ambulatory Visit: Payer: BLUE CROSS/BLUE SHIELD | Admitting: Family Medicine

## 2017-08-12 ENCOUNTER — Encounter (HOSPITAL_COMMUNITY): Payer: Self-pay | Admitting: Emergency Medicine

## 2017-08-12 DIAGNOSIS — S0003XA Contusion of scalp, initial encounter: Secondary | ICD-10-CM | POA: Diagnosis not present

## 2017-08-12 DIAGNOSIS — Z043 Encounter for examination and observation following other accident: Secondary | ICD-10-CM | POA: Diagnosis not present

## 2017-08-12 DIAGNOSIS — Y929 Unspecified place or not applicable: Secondary | ICD-10-CM | POA: Insufficient documentation

## 2017-08-12 DIAGNOSIS — Y939 Activity, unspecified: Secondary | ICD-10-CM | POA: Insufficient documentation

## 2017-08-12 DIAGNOSIS — Y999 Unspecified external cause status: Secondary | ICD-10-CM | POA: Insufficient documentation

## 2017-08-12 DIAGNOSIS — W109XXA Fall (on) (from) unspecified stairs and steps, initial encounter: Secondary | ICD-10-CM | POA: Insufficient documentation

## 2017-08-12 DIAGNOSIS — Z79899 Other long term (current) drug therapy: Secondary | ICD-10-CM | POA: Insufficient documentation

## 2017-08-12 NOTE — ED Triage Notes (Signed)
Pt states she fell down about 13 steps yesterday at her sister's house  Pt states she fell face first then flipped and hit the middle of her back on the step  Pt states when she got to the landing she hit her head on the wall  Denies LOC but states she had to lay there for about 15 minutes because she was dizzy  Pt states today she has pain in the middle of her back  States it feels like her spine, not muscular  Pt states she has had a headache, dizziness, nausea, and vomiting today

## 2017-08-13 ENCOUNTER — Emergency Department (HOSPITAL_COMMUNITY)
Admission: EM | Admit: 2017-08-13 | Discharge: 2017-08-13 | Disposition: A | Discharge status: Home / Self Care | Payer: BLUE CROSS/BLUE SHIELD | Attending: Emergency Medicine | Admitting: Emergency Medicine

## 2017-08-13 ENCOUNTER — Emergency Department (HOSPITAL_COMMUNITY): Payer: BLUE CROSS/BLUE SHIELD

## 2017-08-13 ENCOUNTER — Encounter (HOSPITAL_COMMUNITY): Payer: Self-pay | Admitting: Radiology

## 2017-08-13 DIAGNOSIS — W19XXXA Unspecified fall, initial encounter: Secondary | ICD-10-CM

## 2017-08-13 MED ORDER — HYDROMORPHONE HCL 1 MG/ML IJ SOLN
2.0000 mg | Freq: Once | INTRAMUSCULAR | Status: AC
  Administered 2017-08-13: 2 mg via INTRAMUSCULAR
  Filled 2017-08-13: qty 2

## 2017-08-13 NOTE — ED Notes (Signed)
Pt verbalized understanding of discharge information. Patient was able to ambulate to restroom without difficulty prior to discharge.
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
## 2017-08-13 NOTE — ED Notes (Signed)
Patient transported to CT 

## 2017-08-27 ENCOUNTER — Ambulatory Visit: Payer: BLUE CROSS/BLUE SHIELD | Admitting: Neurology

## 2017-08-31 IMAGING — CT CT ABD-PELV W/O CM
3 of 4 series · 11 of 46 positions shown, 16 images · non-contrast
Comparison: 04/14/2015 CT abdomen and pelvis

CLINICAL DATA: 45 y/o F; vomiting and body pain with history of
lupus.

EXAM:
CT ABDOMEN AND PELVIS WITHOUT CONTRAST
TECHNIQUE: Multidetector CT imaging of the abdomen and pelvis was performed
following the standard protocol without IV contrast.

[Series 2: abd/pel w/o · axial · non-contrast · 0.72mm/px · z∈[-427,-92]mm · 7 of 91 slices shown, 12 images]
[im 12/91  soft-tissue]
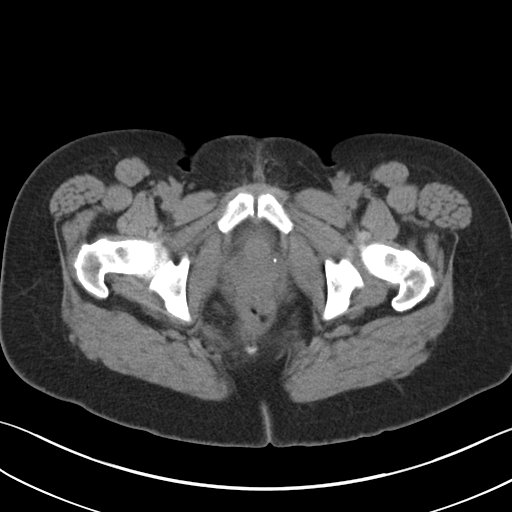
[im 12/91  bone]
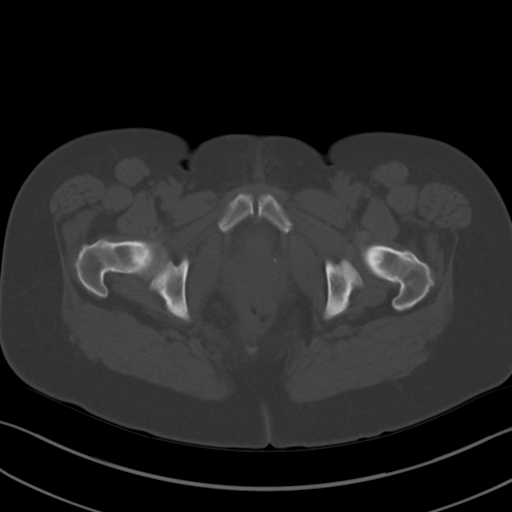
[im 23/91  soft-tissue]
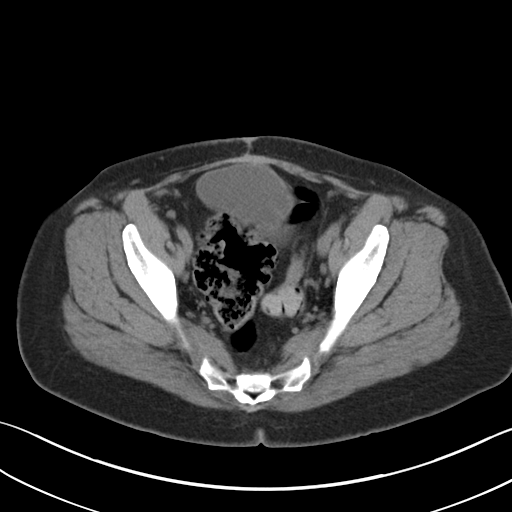
[im 34/91  soft-tissue]
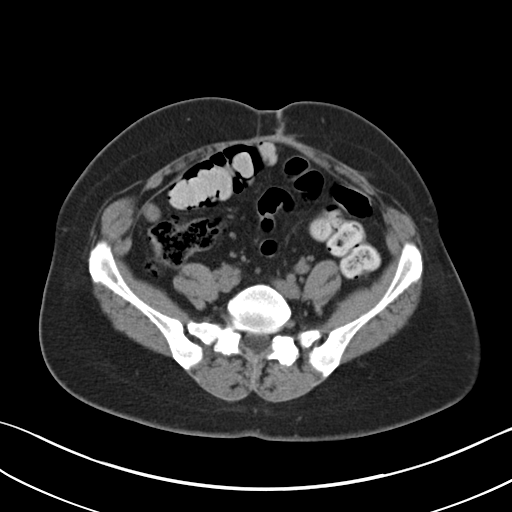
[im 46/91  soft-tissue]
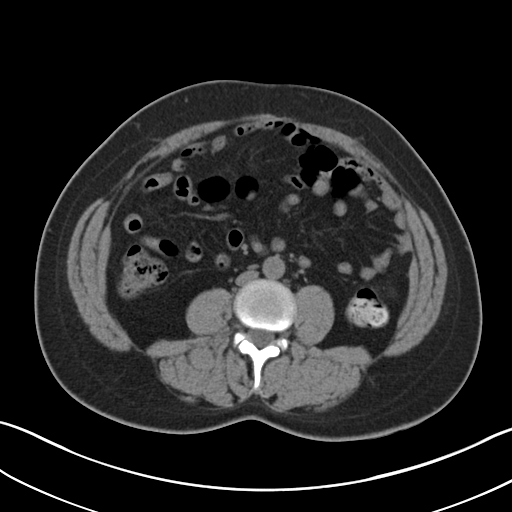
[im 46/91  lung]
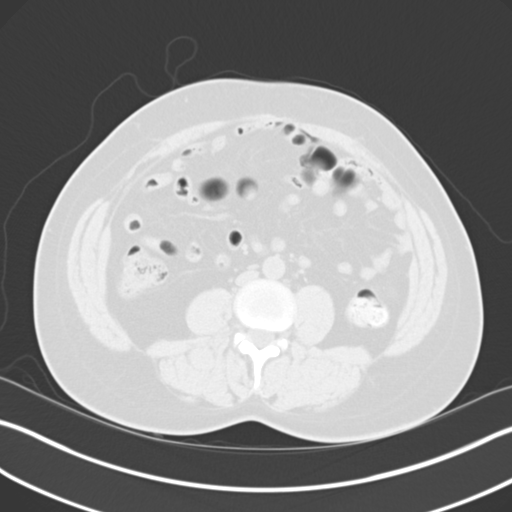
[im 57/91  soft-tissue]
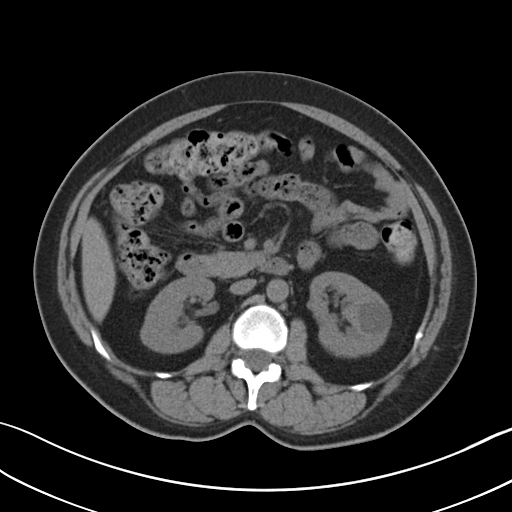
[im 57/91  lung]
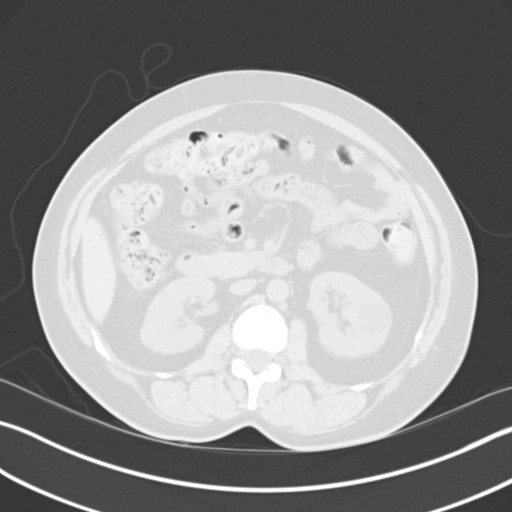
[im 68/91  soft-tissue]
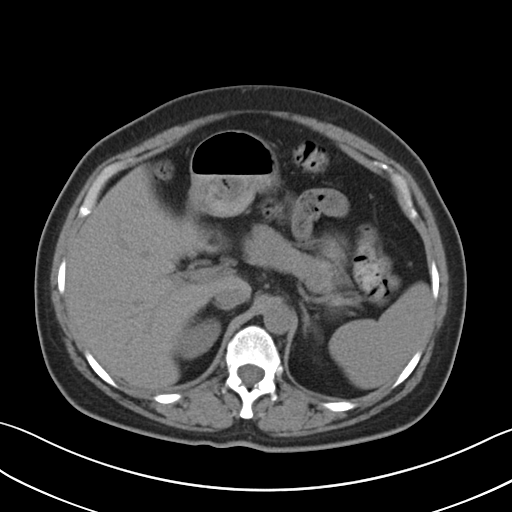
[im 68/91  lung]
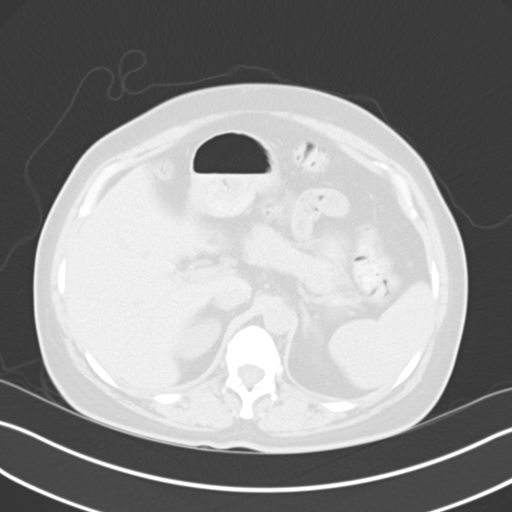
[im 79/91  soft-tissue]
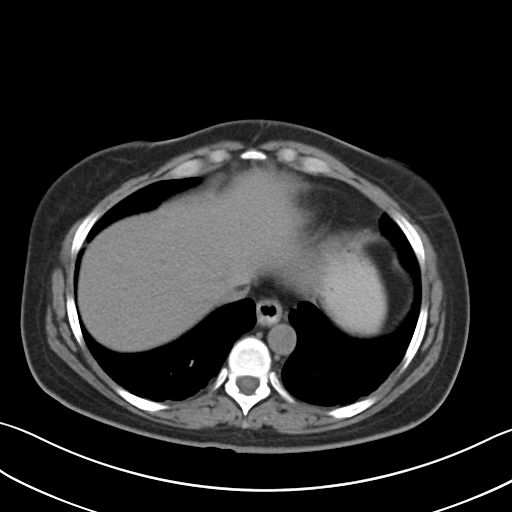
[im 79/91  lung]
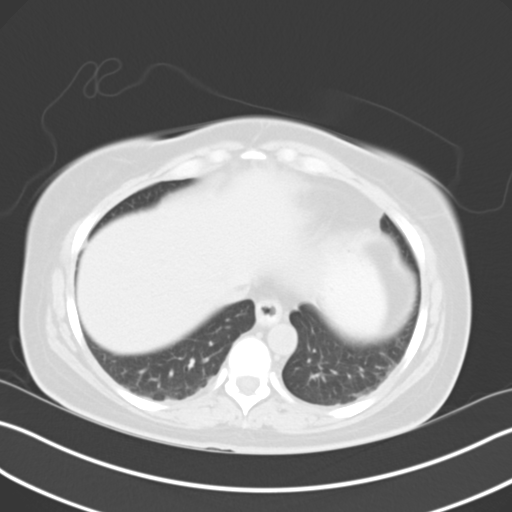

[Series 5: coronal · coronal · 0.66mm/px · 3 of 132 slices shown]
[im 44/132  soft-tissue]
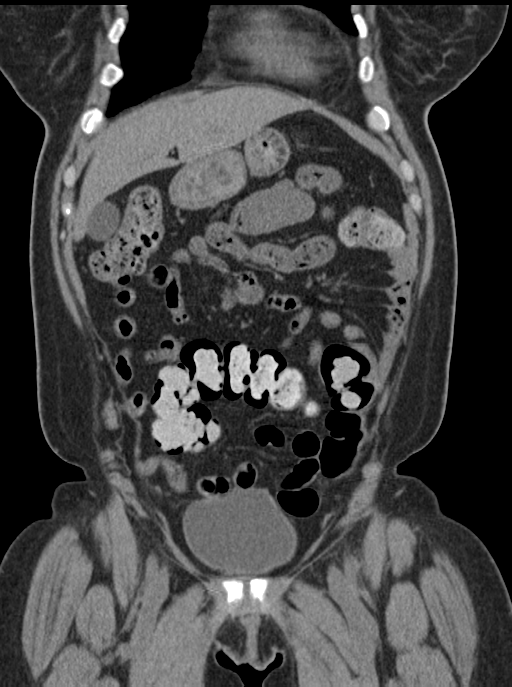
[im 59/132  soft-tissue]
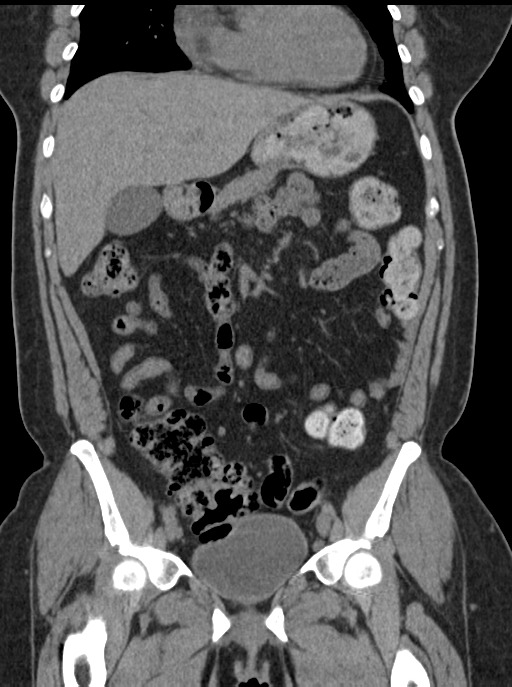
[im 73/132  soft-tissue]
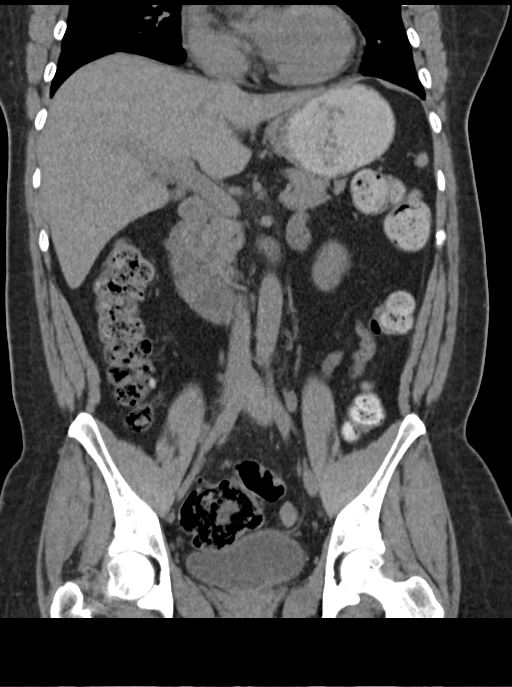

[Series 6: sagittal · sagittal · 0.53mm/px · 1 of 166 slices shown]
[im 56/166  soft-tissue]
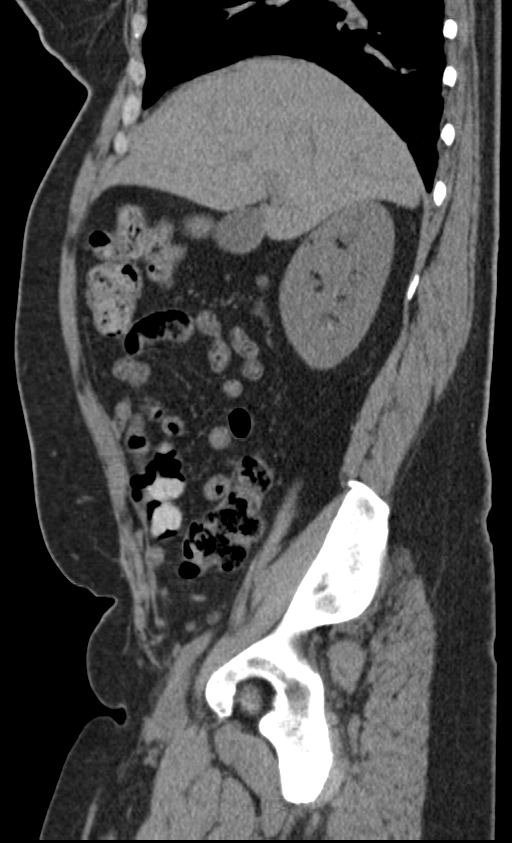

[11 of 46 positions shown; findings below may reference images not displayed]

FINDINGS: Lower chest: No acute abnormality.

Hepatobiliary: No focal liver abnormality is seen. No gallstones,
gallbladder wall thickening, or biliary dilatation.

Pancreas: Unremarkable. No pancreatic ductal dilatation or
surrounding inflammatory changes.

Spleen: Normal in size without focal abnormality. Stable 11 mm
splenule anterior to the spleen.

Adrenals/Urinary Tract: Adrenal glands are unremarkable. Left kidney
interpolar 24 mm cyst. Kidneys are otherwise normal, without renal
calculi, focal lesion, or hydronephrosis. Bladder is unremarkable.

Stomach/Bowel: Stomach is within normal limits. Appendix appears
normal. No evidence of bowel wall thickening, distention, or
inflammatory changes.

Vascular/Lymphatic: No significant vascular findings are present. No
enlarged abdominal or pelvic lymph nodes.

Reproductive: Status post hysterectomy. No adnexal masses.

Other: No ascites.

Musculoskeletal: Postsurgical changes from ventral lower anterior
abdominal incision. Multiple small bone islands in the proximal
femurs. Mild levocurvature of the lumbar spine and lower lumbar
facet arthrosis. No acute osseous abnormality identified.
IMPRESSION: 1. No acute process identified as explanation for abdominal pain.
2. No significant interval change.

By: Janiria Batuta M.D.

## 2017-12-11 ENCOUNTER — Emergency Department (HOSPITAL_COMMUNITY): Payer: BLUE CROSS/BLUE SHIELD

## 2017-12-11 ENCOUNTER — Emergency Department (HOSPITAL_COMMUNITY)
Admission: EM | Admit: 2017-12-11 | Discharge: 2017-12-12 | Disposition: A | Discharge status: Home / Self Care | Payer: BLUE CROSS/BLUE SHIELD | Attending: Emergency Medicine | Admitting: Emergency Medicine

## 2017-12-11 ENCOUNTER — Other Ambulatory Visit: Payer: Self-pay

## 2017-12-11 ENCOUNTER — Encounter (HOSPITAL_COMMUNITY): Payer: Self-pay | Admitting: Emergency Medicine

## 2017-12-11 DIAGNOSIS — M545 Low back pain: Secondary | ICD-10-CM | POA: Diagnosis present

## 2017-12-11 DIAGNOSIS — Z86718 Personal history of other venous thrombosis and embolism: Secondary | ICD-10-CM | POA: Diagnosis not present

## 2017-12-11 DIAGNOSIS — Z79899 Other long term (current) drug therapy: Secondary | ICD-10-CM | POA: Insufficient documentation

## 2017-12-11 DIAGNOSIS — M5440 Lumbago with sciatica, unspecified side: Secondary | ICD-10-CM | POA: Diagnosis not present

## 2017-12-11 DIAGNOSIS — Z8673 Personal history of transient ischemic attack (TIA), and cerebral infarction without residual deficits: Secondary | ICD-10-CM | POA: Diagnosis not present

## 2017-12-11 MED ORDER — HYDROMORPHONE HCL 1 MG/ML IJ SOLN: 1.0000 mg | Freq: Once | INTRAMUSCULAR | Status: DC

## 2017-12-11 MED ORDER — DIAZEPAM 5 MG PO TABS
5.0000 mg | ORAL_TABLET | Freq: Once | ORAL | Status: AC
  Administered 2017-12-12: 5 mg via ORAL
  Filled 2017-12-11: qty 1

## 2017-12-11 NOTE — ED Triage Notes (Signed)
Pt is c/o back pain in the center of her lower back on the left side  Pt states the pain is so bad it is starting to effect her ADLs  Pain is radiating down into her legs and feet  States it feels like bee stings in her legs and feet  Pt states the pain in her back feels like a bone is sticking out   Pt states she has had pain for a month but it has been getting progressively worse

## 2017-12-11 NOTE — ED Notes (Signed)
ED Provider at bedside. 

## 2017-12-11 NOTE — ED Notes (Signed)
Patient taken to bathroom on steady. Tolerated ok with 1 assist.

## 2017-12-11 NOTE — ED Notes (Signed)
Collected and sent a urine culture to lab as a save tube if needed.

## 2017-12-12 LAB — URINALYSIS, ROUTINE W REFLEX MICROSCOPIC
Bilirubin Urine: NEGATIVE
Glucose, UA: NEGATIVE mg/dL
KETONES UR: NEGATIVE mg/dL
Leukocytes, UA: NEGATIVE
Nitrite: NEGATIVE
PH: 6 (ref 5.0–8.0)
Protein, ur: NEGATIVE mg/dL
Specific Gravity, Urine: 1.014 (ref 1.005–1.030)

## 2017-12-12 MED ORDER — HYDROCODONE-ACETAMINOPHEN 5-325 MG PO TABS: 1.0000 | ORAL_TABLET | ORAL | 0 refills | Status: DC | PRN

## 2017-12-12 MED ORDER — HYDROMORPHONE HCL 2 MG/ML IJ SOLN
2.0000 mg | Freq: Once | INTRAMUSCULAR | Status: AC
  Administered 2017-12-12: 2 mg via INTRAMUSCULAR
  Filled 2017-12-12: qty 1

## 2017-12-12 MED ORDER — METHOCARBAMOL 500 MG PO TABS: 500.0000 mg | ORAL_TABLET | Freq: Three times a day (TID) | ORAL | 0 refills | Status: DC | PRN
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
## 2017-12-12 NOTE — ED Notes (Signed)
Pt was ambulated in the hall with 1 hand assist. She was slow to get up and get moving, but began to pick up speed, and requested to walk further because she said it helped to move around. While walking, she described a sharp pain in the lumbar spine which radiated down the left leg into the foot. When the pain radiated into the leg 6 or 7 times, she tensed up and stopped walking temporarily.

## 2017-12-12 NOTE — ED Notes (Signed)
Pt ambulating in hallway. Will update vitals when patient returns.

## 2018-03-07 ENCOUNTER — Other Ambulatory Visit: Payer: Self-pay

## 2018-03-07 ENCOUNTER — Emergency Department (HOSPITAL_COMMUNITY): Payer: BLUE CROSS/BLUE SHIELD

## 2018-03-07 ENCOUNTER — Encounter (HOSPITAL_COMMUNITY): Payer: Self-pay

## 2018-03-07 ENCOUNTER — Inpatient Hospital Stay (HOSPITAL_COMMUNITY)
Admission: EM | Admit: 2018-03-07 | Discharge: 2018-03-11 | DRG: 194 | Disposition: A | Discharge status: Home / Self Care | Payer: BLUE CROSS/BLUE SHIELD | Attending: Internal Medicine | Admitting: Internal Medicine

## 2018-03-07 DIAGNOSIS — R9431 Abnormal electrocardiogram [ECG] [EKG]: Secondary | ICD-10-CM | POA: Diagnosis present

## 2018-03-07 DIAGNOSIS — Z8673 Personal history of transient ischemic attack (TIA), and cerebral infarction without residual deficits: Secondary | ICD-10-CM

## 2018-03-07 DIAGNOSIS — Z823 Family history of stroke: Secondary | ICD-10-CM

## 2018-03-07 DIAGNOSIS — Z803 Family history of malignant neoplasm of breast: Secondary | ICD-10-CM

## 2018-03-07 DIAGNOSIS — Z888 Allergy status to other drugs, medicaments and biological substances status: Secondary | ICD-10-CM

## 2018-03-07 DIAGNOSIS — Z886 Allergy status to analgesic agent status: Secondary | ICD-10-CM

## 2018-03-07 DIAGNOSIS — Z820 Family history of epilepsy and other diseases of the nervous system: Secondary | ICD-10-CM

## 2018-03-07 DIAGNOSIS — G40909 Epilepsy, unspecified, not intractable, without status epilepticus: Secondary | ICD-10-CM | POA: Diagnosis present

## 2018-03-07 DIAGNOSIS — J189 Pneumonia, unspecified organism: Secondary | ICD-10-CM | POA: Diagnosis not present

## 2018-03-07 DIAGNOSIS — Z91030 Bee allergy status: Secondary | ICD-10-CM

## 2018-03-07 DIAGNOSIS — J45901 Unspecified asthma with (acute) exacerbation: Secondary | ICD-10-CM | POA: Diagnosis present

## 2018-03-07 DIAGNOSIS — E739 Lactose intolerance, unspecified: Secondary | ICD-10-CM | POA: Diagnosis present

## 2018-03-07 DIAGNOSIS — Z8249 Family history of ischemic heart disease and other diseases of the circulatory system: Secondary | ICD-10-CM

## 2018-03-07 DIAGNOSIS — E119 Type 2 diabetes mellitus without complications: Secondary | ICD-10-CM | POA: Diagnosis present

## 2018-03-07 DIAGNOSIS — Z86718 Personal history of other venous thrombosis and embolism: Secondary | ICD-10-CM

## 2018-03-07 DIAGNOSIS — R0602 Shortness of breath: Secondary | ICD-10-CM

## 2018-03-07 DIAGNOSIS — Z8041 Family history of malignant neoplasm of ovary: Secondary | ICD-10-CM

## 2018-03-07 DIAGNOSIS — Z90710 Acquired absence of both cervix and uterus: Secondary | ICD-10-CM

## 2018-03-07 DIAGNOSIS — R05 Cough: Secondary | ICD-10-CM | POA: Diagnosis not present

## 2018-03-07 DIAGNOSIS — J40 Bronchitis, not specified as acute or chronic: Secondary | ICD-10-CM | POA: Diagnosis present

## 2018-03-07 DIAGNOSIS — M329 Systemic lupus erythematosus, unspecified: Secondary | ICD-10-CM | POA: Diagnosis present

## 2018-03-07 DIAGNOSIS — J209 Acute bronchitis, unspecified: Secondary | ICD-10-CM | POA: Diagnosis present

## 2018-03-07 LAB — CBC
HCT: 38.9 % (ref 36.0–46.0)
HEMOGLOBIN: 12.6 g/dL (ref 12.0–15.0)
MCH: 27.1 pg (ref 26.0–34.0)
MCHC: 32.4 g/dL (ref 30.0–36.0)
MCV: 83.7 fL (ref 78.0–100.0)
Platelets: 338 10*3/uL (ref 150–400)
RBC: 4.65 MIL/uL (ref 3.87–5.11)
RDW: 13.5 % (ref 11.5–15.5)
WBC: 9 10*3/uL (ref 4.0–10.5)

## 2018-03-07 LAB — INFLUENZA PANEL BY PCR (TYPE A & B)
INFLAPCR: NEGATIVE
Influenza B By PCR: NEGATIVE

## 2018-03-07 LAB — COMPREHENSIVE METABOLIC PANEL
ALBUMIN: 4.7 g/dL (ref 3.5–5.0)
ALK PHOS: 122 U/L (ref 38–126)
ALT: 37 U/L (ref 14–54)
ANION GAP: 11 (ref 5–15)
AST: 26 U/L (ref 15–41)
BILIRUBIN TOTAL: 0.2 mg/dL — AB (ref 0.3–1.2)
BUN: 16 mg/dL (ref 6–20)
CALCIUM: 9.9 mg/dL (ref 8.9–10.3)
CO2: 28 mmol/L (ref 22–32)
Chloride: 104 mmol/L (ref 101–111)
Creatinine, Ser: 0.79 mg/dL (ref 0.44–1.00)
Glucose, Bld: 112 mg/dL — ABNORMAL HIGH (ref 65–99)
POTASSIUM: 3.7 mmol/L (ref 3.5–5.1)
Sodium: 143 mmol/L (ref 135–145)
TOTAL PROTEIN: 8.6 g/dL — AB (ref 6.5–8.1)

## 2018-03-07 LAB — BRAIN NATRIURETIC PEPTIDE: B NATRIURETIC PEPTIDE 5: 11 pg/mL (ref 0.0–100.0)

## 2018-03-07 LAB — I-STAT TROPONIN, ED: TROPONIN I, POC: 0 ng/mL (ref 0.00–0.08)

## 2018-03-07 LAB — LIPASE, BLOOD: LIPASE: 26 U/L (ref 11–51)

## 2018-03-07 MED ORDER — MAGNESIUM SULFATE 2 GM/50ML IV SOLN
2.0000 g | Freq: Once | INTRAVENOUS | Status: AC
  Administered 2018-03-07: 2 g via INTRAVENOUS
  Filled 2018-03-07: qty 50

## 2018-03-07 MED ORDER — MORPHINE SULFATE (PF) 4 MG/ML IV SOLN
6.0000 mg | Freq: Once | INTRAVENOUS | Status: AC
  Administered 2018-03-08: 6 mg via INTRAVENOUS
  Filled 2018-03-07: qty 2

## 2018-03-07 MED ORDER — MORPHINE SULFATE (PF) 4 MG/ML IV SOLN: 4.0000 mg | Freq: Once | INTRAVENOUS | Status: DC

## 2018-03-07 MED ORDER — ALBUTEROL (5 MG/ML) CONTINUOUS INHALATION SOLN
10.0000 mg/h | INHALATION_SOLUTION | Freq: Once | RESPIRATORY_TRACT | Status: AC
  Administered 2018-03-07: 10 mg/h via RESPIRATORY_TRACT
  Filled 2018-03-07: qty 20

## 2018-03-07 MED ORDER — ALBUTEROL SULFATE (2.5 MG/3ML) 0.083% IN NEBU
5.0000 mg | INHALATION_SOLUTION | Freq: Once | RESPIRATORY_TRACT | Status: AC
  Administered 2018-03-07: 5 mg via RESPIRATORY_TRACT
  Filled 2018-03-07: qty 6

## 2018-03-07 MED ORDER — METHYLPREDNISOLONE SODIUM SUCC 125 MG IJ SOLR
125.0000 mg | Freq: Once | INTRAMUSCULAR | Status: AC
  Administered 2018-03-07: 125 mg via INTRAVENOUS
  Filled 2018-03-07: qty 2

## 2018-03-07 MED ORDER — ACETAMINOPHEN 500 MG PO TABS
1000.0000 mg | ORAL_TABLET | Freq: Once | ORAL | Status: AC
  Administered 2018-03-08: 1000 mg via ORAL
  Filled 2018-03-07: qty 2

## 2018-03-07 MED ORDER — SODIUM CHLORIDE 0.9 % IV BOLUS
1000.0000 mL | Freq: Once | INTRAVENOUS | Status: AC
  Administered 2018-03-08: 1000 mL via INTRAVENOUS

## 2018-03-07 MED ORDER — ONDANSETRON HCL 4 MG/2ML IJ SOLN
4.0000 mg | Freq: Once | INTRAMUSCULAR | Status: AC
  Administered 2018-03-08: 4 mg via INTRAVENOUS
  Filled 2018-03-07: qty 2

## 2018-03-07 NOTE — ED Triage Notes (Signed)
Patient c/o expiratory wheezing, non productive cough and "all over chest pain x 1 1/2 weeks.
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
## 2018-03-07 NOTE — ED Notes (Signed)
Patient transported to CT 

## 2018-03-08 ENCOUNTER — Observation Stay (HOSPITAL_BASED_OUTPATIENT_CLINIC_OR_DEPARTMENT_OTHER): Payer: BLUE CROSS/BLUE SHIELD

## 2018-03-08 ENCOUNTER — Other Ambulatory Visit (HOSPITAL_COMMUNITY): Payer: BLUE CROSS/BLUE SHIELD

## 2018-03-08 ENCOUNTER — Observation Stay (HOSPITAL_COMMUNITY): Payer: BLUE CROSS/BLUE SHIELD

## 2018-03-08 DIAGNOSIS — R9431 Abnormal electrocardiogram [ECG] [EKG]: Secondary | ICD-10-CM | POA: Diagnosis present

## 2018-03-08 DIAGNOSIS — I5030 Unspecified diastolic (congestive) heart failure: Secondary | ICD-10-CM

## 2018-03-08 DIAGNOSIS — J40 Bronchitis, not specified as acute or chronic: Secondary | ICD-10-CM | POA: Diagnosis present

## 2018-03-08 DIAGNOSIS — Z86718 Personal history of other venous thrombosis and embolism: Secondary | ICD-10-CM

## 2018-03-08 LAB — STREP PNEUMONIAE URINARY ANTIGEN: Strep Pneumo Urinary Antigen: NEGATIVE

## 2018-03-08 LAB — BASIC METABOLIC PANEL
ANION GAP: 10 (ref 5–15)
BUN: 14 mg/dL (ref 6–20)
CALCIUM: 9.1 mg/dL (ref 8.9–10.3)
CO2: 23 mmol/L (ref 22–32)
Chloride: 104 mmol/L (ref 101–111)
Creatinine, Ser: 0.87 mg/dL (ref 0.44–1.00)
GFR calc Af Amer: >60 mL/min (ref >60)
GFR calc non Af Amer: >60 mL/min (ref >60)
Glucose, Bld: 304 mg/dL — ABNORMAL HIGH (ref 65–99)
POTASSIUM: 4.3 mmol/L (ref 3.5–5.1)
Sodium: 137 mmol/L (ref 135–145)

## 2018-03-08 LAB — URINALYSIS, ROUTINE W REFLEX MICROSCOPIC
Bilirubin Urine: NEGATIVE
GLUCOSE, UA: 50 mg/dL — AB
KETONES UR: 20 mg/dL — AB
Leukocytes, UA: NEGATIVE
NITRITE: POSITIVE — AB
PROTEIN: NEGATIVE mg/dL
Specific Gravity, Urine: 1.016 (ref 1.005–1.030)
pH: 5 (ref 5.0–8.0)

## 2018-03-08 LAB — RESPIRATORY PANEL BY PCR
Adenovirus: NOT DETECTED
Bordetella pertussis: NOT DETECTED
CORONAVIRUS 229E-RVPPCR: NOT DETECTED
CORONAVIRUS HKU1-RVPPCR: NOT DETECTED
CORONAVIRUS NL63-RVPPCR: NOT DETECTED
CORONAVIRUS OC43-RVPPCR: NOT DETECTED
Chlamydophila pneumoniae: NOT DETECTED
Influenza A: NOT DETECTED
Influenza B: NOT DETECTED
METAPNEUMOVIRUS-RVPPCR: NOT DETECTED
Mycoplasma pneumoniae: NOT DETECTED
PARAINFLUENZA VIRUS 1-RVPPCR: NOT DETECTED
PARAINFLUENZA VIRUS 2-RVPPCR: NOT DETECTED
Parainfluenza Virus 3: NOT DETECTED
Parainfluenza Virus 4: NOT DETECTED
Respiratory Syncytial Virus: NOT DETECTED
Rhinovirus / Enterovirus: NOT DETECTED

## 2018-03-08 LAB — CBC
HEMATOCRIT: 36.1 % (ref 36.0–46.0)
Hemoglobin: 11.5 g/dL — ABNORMAL LOW (ref 12.0–15.0)
MCH: 26.6 pg (ref 26.0–34.0)
MCHC: 31.9 g/dL (ref 30.0–36.0)
MCV: 83.6 fL (ref 78.0–100.0)
Platelets: 337 10*3/uL (ref 150–400)
RBC: 4.32 MIL/uL (ref 3.87–5.11)
RDW: 13.6 % (ref 11.5–15.5)
WBC: 12.6 10*3/uL — AB (ref 4.0–10.5)

## 2018-03-08 LAB — HEMOGLOBIN A1C
Hgb A1c MFr Bld: 7.2 % — ABNORMAL HIGH (ref 4.8–5.6)
MEAN PLASMA GLUCOSE: 159.94 mg/dL

## 2018-03-08 LAB — ECHOCARDIOGRAM COMPLETE
Height: 61 in
WEIGHTICAEL: 2730.18 [oz_av]

## 2018-03-08 LAB — TROPONIN I
Troponin I: <0.03 ng/mL (ref <0.03)
Troponin I: <0.03 ng/mL (ref <0.03)

## 2018-03-08 LAB — SEDIMENTATION RATE: Sed Rate: 19 mm/hr (ref 0–22)

## 2018-03-08 LAB — PROCALCITONIN: Procalcitonin: <0.1 ng/mL

## 2018-03-08 LAB — C-REACTIVE PROTEIN: CRP: <0.8 mg/dL (ref <1.0)

## 2018-03-08 MED ORDER — IOPAMIDOL (ISOVUE-370) INJECTION 76%
100.0000 mL | Freq: Once | INTRAVENOUS | Status: AC | PRN
  Administered 2018-03-08: 100 mL via INTRAVENOUS

## 2018-03-08 MED ORDER — DIPHENHYDRAMINE HCL 50 MG/ML IJ SOLN
50.0000 mg | Freq: Once | INTRAMUSCULAR | Status: AC
  Administered 2018-03-08: 50 mg via INTRAVENOUS
  Filled 2018-03-08: qty 1

## 2018-03-08 MED ORDER — SODIUM CHLORIDE 0.9 % IV SOLN
500.0000 mg | INTRAVENOUS | Status: DC
  Administered 2018-03-08 – 2018-03-10 (×3): 500 mg via INTRAVENOUS
  Filled 2018-03-08 (×4): qty 500

## 2018-03-08 MED ORDER — MORPHINE SULFATE (PF) 4 MG/ML IV SOLN: 2.0000 mg | INTRAVENOUS | Status: DC | PRN

## 2018-03-08 MED ORDER — HYDROMORPHONE HCL 1 MG/ML IJ SOLN
0.5000 mg | INTRAMUSCULAR | Status: DC | PRN
  Administered 2018-03-08 – 2018-03-11 (×13): 0.5 mg via INTRAVENOUS
  Filled 2018-03-08 (×13): qty 0.5

## 2018-03-08 MED ORDER — ONDANSETRON HCL 4 MG/2ML IJ SOLN
4.0000 mg | Freq: Four times a day (QID) | INTRAMUSCULAR | Status: DC | PRN
  Administered 2018-03-08 – 2018-03-11 (×6): 4 mg via INTRAVENOUS
  Filled 2018-03-08 (×6): qty 2

## 2018-03-08 MED ORDER — ENOXAPARIN SODIUM 80 MG/0.8ML ~~LOC~~ SOLN
80.0000 mg | SUBCUTANEOUS | Status: AC
  Administered 2018-03-08: 80 mg via SUBCUTANEOUS
  Filled 2018-03-08: qty 0.8

## 2018-03-08 MED ORDER — DIPHENHYDRAMINE HCL 50 MG PO CAPS: 50.0000 mg | ORAL_CAPSULE | Freq: Once | ORAL | Status: AC

## 2018-03-08 MED ORDER — BENZONATATE 100 MG PO CAPS
100.0000 mg | ORAL_CAPSULE | Freq: Two times a day (BID) | ORAL | Status: DC
  Administered 2018-03-08 – 2018-03-09 (×3): 100 mg via ORAL
  Filled 2018-03-08 (×3): qty 1

## 2018-03-08 MED ORDER — LORAZEPAM 2 MG/ML IJ SOLN
0.5000 mg | Freq: Once | INTRAMUSCULAR | Status: AC
  Administered 2018-03-08: 0.5 mg via INTRAVENOUS
  Filled 2018-03-08: qty 1

## 2018-03-08 MED ORDER — HYDROCODONE-ACETAMINOPHEN 5-325 MG PO TABS
1.0000 | ORAL_TABLET | Freq: Four times a day (QID) | ORAL | Status: DC | PRN
  Administered 2018-03-08 – 2018-03-11 (×2): 1 via ORAL
  Filled 2018-03-08 (×3): qty 1

## 2018-03-08 MED ORDER — DIPHENHYDRAMINE HCL 50 MG/ML IJ SOLN: 25.0000 mg | Freq: Four times a day (QID) | INTRAMUSCULAR | Status: DC | PRN

## 2018-03-08 MED ORDER — ONDANSETRON HCL 4 MG PO TABS: 4.0000 mg | ORAL_TABLET | Freq: Four times a day (QID) | ORAL | Status: DC | PRN

## 2018-03-08 MED ORDER — ENOXAPARIN SODIUM 40 MG/0.4ML ~~LOC~~ SOLN
40.0000 mg | SUBCUTANEOUS | Status: DC
  Administered 2018-03-09 – 2018-03-11 (×3): 40 mg via SUBCUTANEOUS
  Filled 2018-03-08 (×3): qty 0.4

## 2018-03-08 MED ORDER — LABETALOL HCL 5 MG/ML IV SOLN
5.0000 mg | INTRAVENOUS | Status: DC | PRN
  Filled 2018-03-08: qty 4

## 2018-03-08 MED ORDER — ACETAMINOPHEN 650 MG RE SUPP: 650.0000 mg | Freq: Four times a day (QID) | RECTAL | Status: DC | PRN

## 2018-03-08 MED ORDER — GUAIFENESIN-DM 100-10 MG/5ML PO SYRP
5.0000 mL | ORAL_SOLUTION | ORAL | Status: DC | PRN
Start: 2018-03-08 — End: 2018-03-09
  Administered 2018-03-08: 5 mL via ORAL
  Filled 2018-03-08: qty 10

## 2018-03-08 MED ORDER — IOPAMIDOL (ISOVUE-370) INJECTION 76%
INTRAVENOUS | Status: AC
  Filled 2018-03-08: qty 100

## 2018-03-08 MED ORDER — SODIUM CHLORIDE 0.9 % IV SOLN
1.0000 g | INTRAVENOUS | Status: DC
  Administered 2018-03-08 – 2018-03-11 (×4): 1 g via INTRAVENOUS
  Filled 2018-03-08 (×4): qty 1

## 2018-03-08 MED ORDER — SODIUM CHLORIDE 0.9 % IV SOLN
INTRAVENOUS | Status: DC
  Administered 2018-03-08: 02:00:00 via INTRAVENOUS

## 2018-03-08 MED ORDER — SODIUM CHLORIDE 0.9 % IV SOLN
INTRAVENOUS | Status: AC
  Administered 2018-03-08 (×2): via INTRAVENOUS

## 2018-03-08 MED ORDER — PREDNISONE 20 MG PO TABS
40.0000 mg | ORAL_TABLET | Freq: Every day | ORAL | Status: DC
  Administered 2018-03-08 – 2018-03-11 (×4): 40 mg via ORAL
  Filled 2018-03-08 (×4): qty 2

## 2018-03-08 MED ORDER — GUAIFENESIN ER 600 MG PO TB12
600.0000 mg | ORAL_TABLET | Freq: Two times a day (BID) | ORAL | Status: DC
  Administered 2018-03-08 (×2): 600 mg via ORAL
  Filled 2018-03-08 (×3): qty 1

## 2018-03-08 MED ORDER — IPRATROPIUM-ALBUTEROL 0.5-2.5 (3) MG/3ML IN SOLN
3.0000 mL | Freq: Four times a day (QID) | RESPIRATORY_TRACT | Status: DC
  Administered 2018-03-08 – 2018-03-09 (×5): 3 mL via RESPIRATORY_TRACT
  Filled 2018-03-08 (×5): qty 3

## 2018-03-08 MED ORDER — DIPHENHYDRAMINE HCL 25 MG PO CAPS: 25.0000 mg | ORAL_CAPSULE | Freq: Four times a day (QID) | ORAL | Status: DC | PRN

## 2018-03-08 MED ORDER — ACETAMINOPHEN 325 MG PO TABS: 650.0000 mg | ORAL_TABLET | Freq: Four times a day (QID) | ORAL | Status: DC | PRN

## 2018-03-08 MED ORDER — ENOXAPARIN SODIUM 80 MG/0.8ML ~~LOC~~ SOLN: 80.0000 mg | Freq: Two times a day (BID) | SUBCUTANEOUS | Status: DC

## 2018-03-08 MED ORDER — IPRATROPIUM-ALBUTEROL 0.5-2.5 (3) MG/3ML IN SOLN: 3.0000 mL | RESPIRATORY_TRACT | Status: DC | PRN

## 2018-03-08 NOTE — Progress Notes (Signed)
ANTICOAGULATION CONSULT NOTE - Initial Consult  Pharmacy Consult for enoxaparin Indication: VTE Treatment  Allergies  Allergen Reactions  . Bee Venom Anaphylaxis  . Diamox [Acetazolamide] Anaphylaxis  . Nsaids Anaphylaxis  . Omnipaque [Iohexol] Anaphylaxis  . Compazine [Prochlorperazine] Hives  . Iopamidol Hives  . Lactose Intolerance (Gi) Nausea And Vomiting  . Reglan [Metoclopramide] Hives    Patient Measurements: Height: 5\' 1"  (154.9 cm) Weight: 170 lb 10.2 oz (77.4 kg) IBW/kg (Calculated) : 47.8 Heparin Dosing Weight:   Vital Signs: Temp: 98.1 F (36.7 C) (03/26 0120) Temp Source: Oral (03/26 0120) BP: 164/105 (03/26 0120) Pulse Rate: 121 (03/26 0120)  Labs: Recent Labs    03/07/18 2106  HGB 12.6  HCT 38.9  PLT 338  CREATININE 0.79    Estimated Creatinine Clearance: 80.9 mL/min (by C-G formula based on SCr of 0.79 mg/dL).   Medical History: Past Medical History:  Diagnosis Date  . DVT (deep venous thrombosis) (HCC)    right arm  . Endometriosis   . Lupus (systemic lupus erythematosus) (HCC) 2007  . Migraine   . Seizures (HCC)    last sz 06/2013, no meds    Medications:  Medications Prior to Admission  Medication Sig Dispense Refill Last Dose  . acetaminophen (TYLENOL) 500 MG tablet Take 500-1,000 mg by mouth every 6 (six) hours as needed for mild pain, moderate pain or headache.    03/07/2018 at Unknown time  . cholecalciferol (VITAMIN D) 1000 units tablet Take 1,000 Units by mouth daily.   Past Week at Unknown time  . vitamin B-12 (CYANOCOBALAMIN) 1000 MCG tablet Take 1,000 mcg by mouth daily.   Past Week at Unknown time  . EPINEPHrine (EPIPEN 2-PAK) 0.3 mg/0.3 mL IJ SOAJ injection Inject 0.3 mg into the muscle once as needed (for severe allergic reaction).   unknown  . HYDROcodone-acetaminophen (NORCO/VICODIN) 5-325 MG tablet Take 1 tablet by mouth every 4 (four) hours as needed for moderate pain. (Patient not taking: Reported on 03/07/2018) 15 tablet  0 Completed Course at Unknown time  . methocarbamol (ROBAXIN) 500 MG tablet Take 1 tablet (500 mg total) by mouth every 8 (eight) hours as needed for muscle spasms. (Patient not taking: Reported on 03/07/2018) 12 tablet 0 Completed Course at Unknown time   Scheduled:  . diphenhydrAMINE  50 mg Oral Once   Or  . diphenhydrAMINE  50 mg Intravenous Once  . enoxaparin (LOVENOX) injection  80 mg Subcutaneous NOW  . enoxaparin (LOVENOX) injection  80 mg Subcutaneous Q12H  . guaiFENesin  600 mg Oral BID  . ipratropium-albuterol  3 mL Nebulization QID    Assessment: Patient with hx of Right arm DVT, in ED with bronchitis.  MD wants pharmacy to dose enoxaparin for r/o PE, pending VQ Scan in AM.  Goal of Therapy:  Heparin level 0.3-0.7 units/ml Monitor platelets by anticoagulation protocol: Yes   Plan:  Enoxaparin 80mg  sq q12hr   Darlina GuysGrimsley Jr, Cloie Wooden Crowford 03/08/2018,1:28 AM

## 2018-03-08 NOTE — Progress Notes (Signed)
Patient admitted to room 1515 from ED. Patient is A&Ox4. Accompanied by sister. Patient has dyspnea, expiratory wheezes. Left AC IV site infusing without difficulty. Patient had generalized redness to face, back and chest. Reports pain is in back and joints and was given MSO4 in ED but wasn't effective. Patient abd is distended with decreased BS. Patient reports last BM yesterday. Has generalized weakness and requires 2 assist to help into bed. Does not use assistive device at home but when Lupus is flaring she often needs help with some ADLs. Patient oriented to room. Call bell, bed use. Placed on telemetry. Will ct monitor.

## 2018-03-08 NOTE — Progress Notes (Signed)
On call provider Dr. Katrinka BlazingSmith paged regarding prep. Callback from Dr. Katrinka BlazingSmith stating he will contact radiology regarding prep regimen.

## 2018-03-08 NOTE — Progress Notes (Signed)
Patient is seen and examined. Please see today's H&P for the details. 47 y/o female with PMH of Lupus, right UE DVT (completed Rx), Endometriosis, seizure, presented with multiple symptoms, shortness of breath, cough, cough related chest pains, fevers. CT chest showed no pulmonary embolism, but possible infiltrates. We will start empiric iv antibiotic treatment for pneumonia. also obtain echo due to reported DOE. Check Ha1c due to hyperglycemia. inflammatory markers ESR/CRP: unremarkable. Trop: negative. F/u labs, pend. D/w patient, her family, Therapist, sports. Cont monitor Donna [name]

## 2018-03-08 NOTE — Progress Notes (Signed)
*  PRELIMINARY RESULTS* Echocardiogram 2D Echocardiogram has been performed.  Donna [name], Donna [name] J 03/08/2018, 3:23 PM

## 2018-03-08 NOTE — Progress Notes (Signed)
Pharmacy Antibiotic Note  Donna MatteJeneria M [name] is a 47 y.o. female admitted on 03/07/2018 with hx of lupus, endometriosis, seizure.  Presented with SOB, cough and fever.  Pharmacy has been consulted for ceftriaxone dosing for pneumonia.  Plan: Ceftriaxone 1gm IV q24h Continue azithromycin 500mg  IV q24h per MD Pharmacy will sign off as adjustment not needed in renal dysfunction  Height: 5\' 1"  (154.9 cm) Weight: 170 lb 10.2 oz (77.4 kg) IBW/kg (Calculated) : 47.8  Temp (24hrs), Avg:98.2 F (36.8 C), Min:98.1 F (36.7 C), Max:98.3 F (36.8 C)  Recent Labs  Lab 03/07/18 2106 03/08/18 0715  WBC 9.0 12.6*  CREATININE 0.79 0.87    Estimated Creatinine Clearance: 74.4 mL/min (by C-G formula based on SCr of 0.87 mg/dL).    Allergies  Allergen Reactions  . Bee Venom Anaphylaxis  . Diamox [Acetazolamide] Anaphylaxis  . Nsaids Anaphylaxis  . Omnipaque [Iohexol] Anaphylaxis  . Compazine [Prochlorperazine] Hives  . Iopamidol Hives  . Lactose Intolerance (Gi) Nausea And Vomiting  . Reglan [Metoclopramide] Hives     Thank you for allowing pharmacy to be a part of this patient's care.  Arley PhenixEllen Jamone Garrido RPh 03/08/2018, 11:12 AM Pager [phone]

## 2018-03-08 NOTE — H&P (Addendum)
History and Physical    Donna [name] [facility] TGY:[id number] DOB: 1971/04/24 DOA: 03/07/2018  Referring MD/NP/PA: Arbutus Leas, PA-C PCP: Patient, No Pcp Per  Patient coming from: home  Chief Complaint: Cough I have personally briefly reviewed patient's old medical records in [place]   HPI: Donna [name] is a 47 y.o. female with medical history significant of lupus, right upper extremity DVT, endometriosis, and seizures; who presents with complaints of cough and shortness of breath for last 1.5 weeks.  Patient reports having a nonproductive cough and being completely out of breath with any kind of movement.  Associated symptoms include wheezing, chest tightness, posttussive emesis, headache, rash(face, back, and legs), fever up to 101 F, joint pain/ swelling, and generalized malaise.  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   Denies having any rhinorrhea, sore throat, diarrhea, or blood in stool/urine.  She previously was being seen at [facility], but reports having a falling out with them over 1-2 years ago, and has not been on any treatment or medication since that time.  Her sister had set her up with appointment to see a physician assistant at [facility] so that she can have a referral to a new rheumatologist.  ED Course: Upon admission into the emergency department patient was noted to be afebrile, pulse 102-128, respirations 16-22, blood pressure 151/97-154/94, and O2 saturations maintained on room air.  Workup including CBC, CMP, BMP, influenza screen, troponin, urinalysis, and CT of the abdomen were relatively unremarkable.  Patient was given 125 mg of Solu-Medrol, hour-long albuterol breathing treatment, 2 g of magnesium sulfate, and 1 L of normal saline IV fluids while in the ED.  Review of Systems  Constitutional: Positive for fever and malaise/fatigue.  HENT: Negative for ear  discharge and nosebleeds.   Eyes: Negative for photophobia and pain.  Respiratory: Positive for cough, shortness of breath and wheezing.   Cardiovascular: Positive for leg swelling.       Positive for chest tightness  Gastrointestinal: Positive for nausea. Negative for abdominal pain and diarrhea.  Genitourinary: Negative for dysuria and hematuria.  Musculoskeletal: Positive for joint pain and myalgias.  Skin: Positive for rash.  Neurological: Negative for seizures and loss of consciousness.  Psychiatric/Behavioral: Negative for memory loss and suicidal ideas.    Past Medical History:  Diagnosis Date  . DVT (deep venous thrombosis) (HCC)    right arm  . Endometriosis   . Lupus (systemic lupus erythematosus) ([place]) 2007  . Migraine   . Seizures ([place])    last sz 06/2013, no meds    Past Surgical History:  Procedure Laterality Date  . ABDOMINAL HYSTERECTOMY     x2 partial  . CESAREAN SECTION     x3  . MASS EXCISION  02/2014   abd     reports that she has never smoked. She has never used smokeless tobacco. She reports that she does not drink alcohol or use drugs.  Allergies  Allergen Reactions  . Bee Venom Anaphylaxis  . Diamox [Acetazolamide] Anaphylaxis  . Nsaids Anaphylaxis  . Omnipaque [Iohexol] Anaphylaxis  . Compazine [Prochlorperazine] Hives  . Iopamidol Hives  . Lactose Intolerance (Gi) Nausea And Vomiting  . Reglan [Metoclopramide] Hives    Family History  Problem Relation Age of Onset  . CVA Mother 63  . Alzheimer's disease Father   . Cancer Sister  breast & ovarian  . Cancer Maternal Aunt        breast & ovarian  . Birth defects Maternal Uncle     Prior to Admission medications   Medication Sig Start Date End Date Taking? Authorizing Provider  acetaminophen (TYLENOL) 500 MG tablet Take 500-1,000 mg by mouth every 6 (six) hours as needed for mild pain, moderate pain or headache.    Yes [provider]  cholecalciferol (VITAMIN D) 1000  units tablet Take 1,000 Units by mouth daily.   Yes [provider]  vitamin B-12 (CYANOCOBALAMIN) 1000 MCG tablet Take 1,000 mcg by mouth daily.   Yes [provider]  EPINEPHrine (EPIPEN 2-PAK) 0.3 mg/0.3 mL IJ SOAJ injection Inject 0.3 mg into the muscle once as needed (for severe allergic reaction).    [provider]  HYDROcodone-acetaminophen (NORCO/VICODIN) 5-325 MG tablet Take 1 tablet by mouth every 4 (four) hours as needed for moderate pain. Patient not taking: Reported on 03/07/2018 12/12/17   Jola Schmidt, MD  methocarbamol (ROBAXIN) 500 MG tablet Take 1 tablet (500 mg total) by mouth every 8 (eight) hours as needed for muscle spasms. Patient not taking: Reported on 03/07/2018 12/12/17   Jola Schmidt, MD    Physical Exam:  Constitutional: Middle-aged female who appears to be in moderate discomfort Vitals:   03/07/18 1814 03/07/18 2123 03/07/18 2134 03/07/18 2230  BP: (!) 151/108  (!) 154/94 (!) 151/97  Pulse: (!) 128  (!) 102 (!) 127  Resp: (!) 22  16 (!) 22  Temp: 98.3 F (36.8 C)     TempSrc: Oral     SpO2: 98% 99% 100% 100%  Weight: 77.1 kg (170 lb)     Height: _0  (1.549 m)      Eyes: PERRL, lids and conjunctivae normal ENMT: Mucous membranes are moist. Posterior pharynx clear of any exudate or lesions.Neck: normal, supple, no masses, no thyromegaly Respiratory: Tachypneic with mild expiratory wheeze appreciated on physical exam. No significant rhonchi appreciated. Cardiovascular: Tachycardic, no murmurs / rubs / gallops. No extremity edema. 2+ pedal pulses. No carotid bruits.  Abdomen: no tenderness, no masses palpated. No hepatosplenomegaly. Bowel sounds positive.  Musculoskeletal: no clubbing / cyanosis.  Mild swelling noted several joints. Skin: Nonspecific rash noted to the face Neurologic: CN 2-12 grossly intact. Sensation intact, DTR normal. Strength 5/5 in all 4.  Psychiatric: Normal judgment and insight. Alert and oriented x 3.  Normal mood.     Labs on Admission: I have personally reviewed following labs and imaging studies  CBC: Recent Labs  Lab 03/07/18 2106  WBC 9.0  HGB 12.6  HCT 38.9  MCV 83.7  PLT 025   Basic Metabolic Panel: Recent Labs  Lab 03/07/18 2106  NA 143  K 3.7  CL 104  CO2 28  GLUCOSE 112*  BUN 16  CREATININE 0.79  CALCIUM 9.9   GFR: Estimated Creatinine Clearance: 80.8 mL/min (by C-G formula based on SCr of 0.79 mg/dL). Liver Function Tests: Recent Labs  Lab 03/07/18 2106  AST 26  ALT 37  ALKPHOS 122  BILITOT 0.2*  PROT 8.6*  ALBUMIN 4.7   Recent Labs  Lab 03/07/18 2106  LIPASE 26   No results for input(s): AMMONIA in the last 168 hours. Coagulation Profile: No results for input(s): INR, PROTIME in the last 168 hours. Cardiac Enzymes: No results for input(s): CKTOTAL, CKMB, CKMBINDEX, TROPONINI in the last 168 hours. BNP (last 3 results) No results for input(s): PROBNP in the last 8760  hours. HbA1C: No results for input(s): HGBA1C in the last 72 hours. CBG: No results for input(s): GLUCAP in the last 168 hours. Lipid Profile: No results for input(s): CHOL, HDL, LDLCALC, TRIG, CHOLHDL, LDLDIRECT in the last 72 hours. Thyroid Function Tests: No results for input(s): TSH, T4TOTAL, FREET4, T3FREE, THYROIDAB in the last 72 hours. Anemia Panel: No results for input(s): VITAMINB12, FOLATE, FERRITIN, TIBC, IRON, RETICCTPCT in the last 72 hours. Urine analysis:    Component Value Date/Time   COLORURINE YELLOW 12/11/2017 2338   APPEARANCEUR CLEAR 12/11/2017 2338   LABSPEC 1.014 12/11/2017 2338   PHURINE 6.0 12/11/2017 2338   GLUCOSEU NEGATIVE 12/11/2017 2338   HGBUR SMALL (A) 12/11/2017 2338   BILIRUBINUR NEGATIVE 12/11/2017 2338   KETONESUR NEGATIVE 12/11/2017 2338   PROTEINUR NEGATIVE 12/11/2017 2338   UROBILINOGEN 0.2 04/14/2015 0300   NITRITE NEGATIVE 12/11/2017 2338   LEUKOCYTESUR NEGATIVE 12/11/2017 2338   Sepsis Labs: No results found for this  or any previous visit (from the past 240 hour(s)).   Radiological Exams on Admission: Ct Abdomen Pelvis Wo Contrast  Result Date: 03/07/2018 CLINICAL DATA:  Initial evaluation for acute upper abdominal pain, abdominal distension. EXAM: CT ABDOMEN AND PELVIS WITHOUT CONTRAST TECHNIQUE: Multidetector CT imaging of the abdomen and pelvis was performed following the standard protocol without IV contrast. COMPARISON:  Prior CT from 12/24/2015. FINDINGS: Lower chest: Scattered linear bibasilar opacities noted within the visualized lung bases, most consistent with atelectasis. Visualized lungs are otherwise clear. Hepatobiliary: Liver demonstrates a normal unenhanced appearance. Gallbladder within normal limits. No biliary dilatation. Pancreas: Pancreas within normal limits. Spleen: Subcentimeter hypodensity within the central aspect of the spleen noted, too small the characterize. Spleen otherwise unremarkable. Adrenals/Urinary Tract: Adrenal glands are normal. Kidneys equal in size without nephrolithiasis or hydronephrosis. 3.5 cm cyst noted within the interpolar left kidney. No hydroureter. Partially distended bladder within normal limits. Stomach/Bowel: Stomach within normal limits. No evidence for bowel obstruction. Appendix within normal limits. No acute inflammatory changes seen about the bowels. Vascular/Lymphatic: Intra-abdominal aorta of normal caliber. No adenopathy. Reproductive: Uterus is absent.  Ovaries not discretely identified. Other: No free air or fluid. Postoperative scarring noted at the ventral abdominal wall. Musculoskeletal: No acute osseus abnormality. No worrisome lytic or blastic osseous lesions. Mild to moderate bilateral facet arthropathy noted at L4-5 and L5-S1. IMPRESSION: 1. No CT evidence for acute intra-abdominal or pelvic process. 2. Normal appendix. No other acute inflammatory changes identified within the abdomen and pelvis. Electronically Signed   By: Jeannine Boga M.D.    On: 03/07/2018 23:02   Dg Chest 2 View  Result Date: 03/07/2018 CLINICAL DATA:  Wheezing, shortness of Breath EXAM: CHEST - 2 VIEW COMPARISON:  01/17/2017 FINDINGS: Low lung volumes which accentuates heart size. No confluent airspace opacities. No effusions. No acute bony abnormality. IMPRESSION: Low lung volumes.  No active disease. Electronically Signed   By: Rolm Baptise M.D.   On: 03/07/2018 19:24    EKG: Independently reviewed.  Sinus tachycardia at 130 bpm with diffuse ST wave depressions noted and QTc 493  Assessment/Plan Suspected Bronchitis: Acute. Patient presents with nonproductive cough and shortness of breath.  Chest x-ray with poor inspiratory effort, but did not show any acute abnormalities.  Question respiratory viral illness causing symptoms versus related to patient's lupus. - Admit to a telemetry bed - Add on respiratory virus panel and pro-calcitonin - Solu-Medrol IV - DuoNeb's 4 times daily and prn shortness of breath/wheezing - Check CT angiogram of the chest rule out  PE given history of DVT - Lovenox per pharmacy (VTE treatment dose) until PE can be ruled out  - Tylenol as needed fever/chills - Mucinex  Suspected lupus flare: Acute.  Patient presents with complaints of rash and diffuse joint pain and swelling. Previously treated with Plaquenil at [facility], but she has not been on any medications in over 2 years.  Patient sister has set up appointment for her to 7 scale with a  physician assistant by the name of web at [place] on double-stranded DNA, ESR, CRP, and complement levels - check urinalysis - Hydrocodone/Dilaudid - Patient needs to have referral to establish care with a rheumatologist  Abnormal EKG: Patient with sinus tachycardia with diffuse ST depressions noted and prolonged QTC of 493. - Recheck EKG in a.m. - Trend cardiac troponins  - May warrant further workup  SIRS: Acute.  Patient noted to be afebrile(reported previous fever  PTA), tachycardic and tachypneic on admission, but a lactic acid was not initially obtained on initial workup.   - Follow-up pending studies  History of DVT: Patient with a history of right upper extremity DVT for which she was treated with anticoagulants for 1 year and then discontinued.  History of seizure disorder: Patient not on any anti-elliptic medications.  DVT prophylaxis:lovenox  Code Status: Full  Family Communication: Discussed plan of care with the patient and family present at bedside Disposition Plan: likely discharge home in 1-3 days Consults called: none  Admission status: observation  Norval Morton MD [facility] Pager [phone]   If 7PM-7AM, please contact night-coverage [email or web address] Password [email or web address]  03/08/2018, 12:01 AM

## 2018-03-08 NOTE — Progress Notes (Signed)
Callback from Dr. Katrinka BlazingSmith advising that he spoke with radiology and radiologist and patient is clear to do the one hour prep with benadryl alone. Reports he advised radiology of information but advised RN to followup with radiology as well. Will contact radiology

## 2018-03-08 NOTE — ED Notes (Signed)
ED TO INPATIENT HANDOFF REPORT  Name/Age/Gender Donna [name] 47 y.o. female  Code Status    Code Status Orders  (From admission, onward)        Start     Ordered   03/08/18 0019  Full code  Continuous     03/08/18 0021    Code Status History    Date Active Date Inactive Code Status Order ID Comments User Context   05/24/2017 0135 05/24/2017 1959 Full Code [id number]  Karmen Bongo, MD Inpatient   10/04/2013 1626 10/05/2013 1506 Full Code [id number]  [facility] Pain ED      Home/SNF/Other Home  Chief Complaint chest pain; cough; emesis   Level of Care/Admitting Diagnosis ED Disposition    ED Disposition Condition [facility] [[id number]]  Level of Care: Telemetry [5]  Admit to tele based on following criteria: Complex arrhythmia (Bradycardia/Tachycardia)  Diagnosis: Bronchitis [502774]  Admitting Physician: Norval Morton [[id number]]  Attending Physician: Norval Morton [[id number]]  PT Class (Do Not Modify): Observation [104]  PT Acc Code (Do Not Modify): Observation [10022]       Medical History Past Medical History:  Diagnosis Date  . DVT (deep venous thrombosis) (HCC)    right arm  . Endometriosis   . Lupus (systemic lupus erythematosus) ([place]) 2007  . Migraine   . Seizures ([place])    last sz 06/2013, no meds    Allergies Allergies  Allergen Reactions  . Bee Venom Anaphylaxis  . Diamox [Acetazolamide] Anaphylaxis  . Nsaids Anaphylaxis  . Omnipaque [Iohexol] Anaphylaxis  . Compazine [Prochlorperazine] Hives  . Iopamidol Hives  . Lactose Intolerance (Gi) Nausea And Vomiting  . Reglan [Metoclopramide] Hives    IV Location/Drains/Wounds Patient Lines/Drains/Airways Status   Active Line/Drains/Airways    Name:   Placement date:   Placement time:   Site:   Days:   Peripheral IV 03/07/18 Left Antecubital   03/07/18    2105    Antecubital   1          Labs/Imaging Results for orders placed  or performed during the hospital encounter of 03/07/18 (from the past 48 hour(s))  Influenza panel by PCR (type A & B)     Status: None   Collection Time: 03/07/18  8:36 PM  Result Value Ref Range   Influenza A By PCR NEGATIVE NEGATIVE   Influenza B By PCR NEGATIVE NEGATIVE    Comment: (NOTE) The Xpert Xpress Flu assay is intended as an aid in the diagnosis of  influenza and should not be used as a sole basis for treatment.  This  assay is FDA approved for nasopharyngeal swab specimens only. Nasal  washings and aspirates are unacceptable for Xpert Xpress Flu testing. Performed at [facility], [place] [address], [place], [place] [address]   Brain natriuretic peptide     Status: None   Collection Time: 03/07/18  9:06 PM  Result Value Ref Range   B Natriuretic Peptide 11.0 0.0 - 100.0 pg/mL    Comment: Performed at [facility], [place] [address], [place], [place] [address]  Comprehensive metabolic panel     Status: Abnormal   Collection Time: 03/07/18  9:06 PM  Result Value Ref Range   Sodium 143 135 - 145 mmol/L   Potassium 3.7 3.5 - 5.1 mmol/L   Chloride 104 101 - 111 mmol/L   CO2 28 22 - 32 mmol/L   Glucose, Bld  112 (H) 65 - 99 mg/dL   BUN 16 6 - 20 mg/dL   Creatinine, Ser 0.79 0.44 - 1.00 mg/dL   Calcium 9.9 8.9 - 10.3 mg/dL   Total Protein 8.6 (H) 6.5 - 8.1 g/dL   Albumin 4.7 3.5 - 5.0 g/dL   AST 26 15 - 41 U/L   ALT 37 14 - 54 U/L   Alkaline Phosphatase 122 38 - 126 U/L   Total Bilirubin 0.2 (L) 0.3 - 1.2 mg/dL   GFR calc non Af Amer >60 >60 mL/min   GFR calc Af Amer >60 >60 mL/min    Comment: (NOTE) The eGFR has been calculated using the CKD EPI equation. This calculation has not been validated in all clinical situations. eGFR's persistently <60 mL/min signify possible Chronic Kidney Disease.    Anion gap 11 5 - 15    Comment: Performed at [facility], [place] [address], [place], [place] [address]  Lipase, blood      Status: None   Collection Time: 03/07/18  9:06 PM  Result Value Ref Range   Lipase 26 11 - 51 U/L    Comment: Performed at [facility], [place] [address] [place], [place] [address]  CBC     Status: None   Collection Time: 03/07/18  9:06 PM  Result Value Ref Range   WBC 9.0 4.0 - 10.5 K/uL   RBC 4.65 3.87 - 5.11 MIL/uL   Hemoglobin 12.6 12.0 - 15.0 g/dL   HCT 38.9 36.0 - 46.0 %   MCV 83.7 78.0 - 100.0 fL   MCH 27.1 26.0 - 34.0 pg   MCHC 32.4 30.0 - 36.0 g/dL   RDW 13.5 11.5 - 15.5 %   Platelets 338 150 - 400 K/uL    Comment: Performed at [facility], [place] [address], [place], [place] [address]  I-stat troponin, ED     Status: None   Collection Time: 03/07/18  9:11 PM  Result Value Ref Range   Troponin i, poc 0.00 0.00 - 0.08 ng/mL   Comment 3            Comment: Due to the release kinetics of cTnI, a negative result within the first hours of the onset of symptoms does not rule out myocardial infarction with certainty. If myocardial infarction is still suspected, repeat the test at appropriate intervals.    Ct Abdomen Pelvis Wo Contrast  Result Date: 03/07/2018 CLINICAL DATA:  Initial evaluation for acute upper abdominal pain, abdominal distension. EXAM: CT ABDOMEN AND PELVIS WITHOUT CONTRAST TECHNIQUE: Multidetector CT imaging of the abdomen and pelvis was performed following the standard protocol without IV contrast. COMPARISON:  Prior CT from 12/24/2015. FINDINGS: Lower chest: Scattered linear bibasilar opacities noted within the visualized lung bases, most consistent with atelectasis. Visualized lungs are otherwise clear. Hepatobiliary: Liver demonstrates a normal unenhanced appearance. Gallbladder within normal limits. No biliary dilatation. Pancreas: Pancreas within normal limits. Spleen: Subcentimeter hypodensity within the central aspect of the spleen noted, too small the characterize. Spleen otherwise unremarkable. Adrenals/Urinary Tract:  Adrenal glands are normal. Kidneys equal in size without nephrolithiasis or hydronephrosis. 3.5 cm cyst noted within the interpolar left kidney. No hydroureter. Partially distended bladder within normal limits. Stomach/Bowel: Stomach within normal limits. No evidence for bowel obstruction. Appendix within normal limits. No acute inflammatory changes seen about the bowels. Vascular/Lymphatic: Intra-abdominal aorta of normal caliber. No adenopathy. Reproductive: Uterus is absent.  Ovaries not discretely identified. Other: No free air or fluid.  Postoperative scarring noted at the ventral abdominal wall. Musculoskeletal: No acute osseus abnormality. No worrisome lytic or blastic osseous lesions. Mild to moderate bilateral facet arthropathy noted at L4-5 and L5-S1. IMPRESSION: 1. No CT evidence for acute intra-abdominal or pelvic process. 2. Normal appendix. No other acute inflammatory changes identified within the abdomen and pelvis. Electronically Signed   By: Jeannine Boga M.D.   On: 03/07/2018 23:02   Dg Chest 2 View  Result Date: 03/07/2018 CLINICAL DATA:  Wheezing, shortness of Breath EXAM: CHEST - 2 VIEW COMPARISON:  01/17/2017 FINDINGS: Low lung volumes which accentuates heart size. No confluent airspace opacities. No effusions. No acute bony abnormality. IMPRESSION: Low lung volumes.  No active disease. Electronically Signed   By: Rolm Baptise M.D.   On: 03/07/2018 19:24    Pending Labs Unresulted Labs (From admission, onward)   Start     Ordered   03/08/18 0500  CBC  Tomorrow morning,   R     03/08/18 0021   03/08/18 4098  Basic metabolic panel  Tomorrow morning,   R     03/08/18 0021   03/08/18 0059  Urinalysis, Routine w reflex microscopic  Once,   R     03/08/18 0058   03/08/18 0053  Sedimentation rate  Add-on,   R     03/08/18 0052   03/08/18 0053  Anti-DNA antibody, double-stranded  Add-on,   R     03/08/18 0052   03/08/18 0053  C3 complement  Add-on,   R     03/08/18 0052    03/08/18 0053  C4 complement  Add-on,   R     03/08/18 0052   03/08/18 0052  C-reactive protein  Add-on,   R     03/08/18 0052   03/08/18 0014  Respiratory Panel by PCR  (Respiratory virus panel)  Add-on,   R     03/08/18 0013      Vitals/Pain Today's Vitals   03/07/18 2230 03/07/18 2330 03/08/18 0000 03/08/18 0030  BP: (!) 151/97 118/90 (!) 137/99 (!) 147/91  Pulse: (!) 127 (!) 121 (!) 115 (!) 112  Resp: (!) 22 (!) 30 (!) 24 (!) 22  Temp:      TempSrc:      SpO2: 100% 95% 97% 97%  Weight:      Height:      PainSc:        Isolation Precautions No active isolations  Medications Medications  sodium chloride 0.9 % bolus 1,000 mL (1,000 mLs Intravenous Given 03/08/18 0030)  0.9 %  sodium chloride infusion (has no administration in time range)  acetaminophen (TYLENOL) tablet 650 mg (has no administration in time range)    Or  acetaminophen (TYLENOL) suppository 650 mg (has no administration in time range)  ondansetron (ZOFRAN) tablet 4 mg (has no administration in time range)    Or  ondansetron (ZOFRAN) injection 4 mg (has no administration in time range)  ipratropium-albuterol (DUONEB) 0.5-2.5 (3) MG/3ML nebulizer solution 3 mL (has no administration in time range)  ipratropium-albuterol (DUONEB) 0.5-2.5 (3) MG/3ML nebulizer solution 3 mL (has no administration in time range)  labetalol (NORMODYNE,TRANDATE) injection 5 mg (has no administration in time range)  diphenhydrAMINE (BENADRYL) capsule 50 mg (has no administration in time range)    Or  diphenhydrAMINE (BENADRYL) injection 50 mg (has no administration in time range)  albuterol (PROVENTIL) (2.5 MG/3ML) 0.083% nebulizer solution 5 mg (5 mg Nebulization Given 03/07/18 1836)  methylPREDNISolone sodium succinate (SOLU-MEDROL) 125 mg/2  mL injection 125 mg (125 mg Intravenous Given 03/07/18 2125)  magnesium sulfate IVPB 2 g 50 mL (0 g Intravenous Stopped 03/07/18 2227)  albuterol (PROVENTIL,VENTOLIN) solution continuous neb (10  mg/hr Nebulization Given 03/07/18 2123)  ondansetron (ZOFRAN) injection 4 mg (4 mg Intravenous Given 03/08/18 0029)  morphine 4 MG/ML injection 6 mg (6 mg Intravenous Given 03/08/18 0029)  acetaminophen (TYLENOL) tablet 1,000 mg (1,000 mg Oral Given 03/08/18 0029)    Mobility walks

## 2018-03-08 NOTE — Progress Notes (Signed)
Contacted radiology and spoke with Lawson FiscalLori, advised that Dr. Katrinka BlazingSmith spoke with radiology and patient was cleared for prep with benadryl. She reports she is aware and will contact RN when she is ready for patient to be medicated. Will c/t monitor.

## 2018-03-08 NOTE — Progress Notes (Signed)
Provider on call paged due to patient c/o significant pain to back and joints and requests pain medication. Awaiting callback.

## 2018-03-08 NOTE — Progress Notes (Signed)
Contacted by radiology regarding CT angio. Asked to clarify with patient what the previous pre medication was when she had contrast since she has an allergy. Patient reports she was given steroid and benadryl in past with contrast and tolerated well with no reaction. Advised radiology of information. Radiology reports they will send up the protocol for what patient will need for pre medication and will require a minimum of four hours before testing is completed. Advised to page on call provider to input the orders for the protocol regimen. Will c/t monitor.

## 2018-03-08 NOTE — Progress Notes (Signed)
Inpatient Diabetes Program Recommendations  AACE/ADA: New Consensus Statement on Inpatient Glycemic Control (2015)  Target Ranges:  Prepandial:   less than 140 mg/dL      Peak postprandial:   less than 180 mg/dL (1-2 hours)      Critically ill patients:  140 - 180 mg/dL   Lab Results  Component Value Date   GLUCAP 76 02/03/2015   HGBA1C 7.2 (H) 03/08/2018    Review of Glycemic Control  Diabetes history: None Outpatient Diabetes medications: N/A Current orders for Inpatient glycemic control: None HgbA1C of 7.2% indicative of diabetes.  On Prednisone 40 mg QD + family hx DM  Inpatient Diabetes Program Recommendations:     Novolog 0-9 units tidwc and HS  For discharge:  Lifestyle modifications, including diet, exercise and modest weight loss to control blood sugars. Recommend glucose meter for pt to check blood sugars at home. Will need to f/u with PCP for management of Type 2 DM.  Will await MD to speak with pt about diagnosis prior to sending educational materials.  Continue to follow.  Thank you. Ailene Ardshonda Hearl Heikes, RD, LDN, CDE Inpatient Diabetes Coordinator [phone]

## 2018-03-09 DIAGNOSIS — M329 Systemic lupus erythematosus, unspecified: Secondary | ICD-10-CM | POA: Diagnosis not present

## 2018-03-09 DIAGNOSIS — R9431 Abnormal electrocardiogram [ECG] [EKG]: Secondary | ICD-10-CM

## 2018-03-09 DIAGNOSIS — J40 Bronchitis, not specified as acute or chronic: Secondary | ICD-10-CM

## 2018-03-09 DIAGNOSIS — Z86718 Personal history of other venous thrombosis and embolism: Secondary | ICD-10-CM

## 2018-03-09 DIAGNOSIS — R0602 Shortness of breath: Secondary | ICD-10-CM | POA: Diagnosis not present

## 2018-03-09 DIAGNOSIS — J209 Acute bronchitis, unspecified: Secondary | ICD-10-CM | POA: Diagnosis not present

## 2018-03-09 LAB — COMPREHENSIVE METABOLIC PANEL
ALT: 26 U/L (ref 14–54)
ANION GAP: 9 (ref 5–15)
AST: 26 U/L (ref 15–41)
Albumin: 4.2 g/dL (ref 3.5–5.0)
Alkaline Phosphatase: 102 U/L (ref 38–126)
BILIRUBIN TOTAL: 0.5 mg/dL (ref 0.3–1.2)
BUN: 15 mg/dL (ref 6–20)
CHLORIDE: 106 mmol/L (ref 101–111)
CO2: 24 mmol/L (ref 22–32)
Calcium: 9.2 mg/dL (ref 8.9–10.3)
Creatinine, Ser: 0.73 mg/dL (ref 0.44–1.00)
GFR calc Af Amer: >60 mL/min (ref >60)
Glucose, Bld: 130 mg/dL — ABNORMAL HIGH (ref 65–99)
POTASSIUM: 4.1 mmol/L (ref 3.5–5.1)
Sodium: 139 mmol/L (ref 135–145)
TOTAL PROTEIN: 7.4 g/dL (ref 6.5–8.1)

## 2018-03-09 LAB — HIV ANTIBODY (ROUTINE TESTING W REFLEX): HIV SCREEN 4TH GENERATION: NONREACTIVE

## 2018-03-09 LAB — GLUCOSE, CAPILLARY
GLUCOSE-CAPILLARY: 342 mg/dL — AB (ref 65–99)
GLUCOSE-CAPILLARY: 203 mg/dL — AB (ref 65–99)
Glucose-Capillary: 121 mg/dL — ABNORMAL HIGH (ref 65–99)
Glucose-Capillary: 150 mg/dL — ABNORMAL HIGH (ref 65–99)

## 2018-03-09 LAB — CBC WITH DIFFERENTIAL/PLATELET
BASOS PCT: 0 %
Basophils Absolute: 0 10*3/uL (ref 0.0–0.1)
EOS PCT: 0 %
Eosinophils Absolute: 0 10*3/uL (ref 0.0–0.7)
HEMATOCRIT: 36.2 % (ref 36.0–46.0)
Hemoglobin: 11.3 g/dL — ABNORMAL LOW (ref 12.0–15.0)
LYMPHS PCT: 14 %
Lymphs Abs: 3.1 10*3/uL (ref 0.7–4.0)
MCH: 26.7 pg (ref 26.0–34.0)
MCHC: 31.2 g/dL (ref 30.0–36.0)
MCV: 85.6 fL (ref 78.0–100.0)
MONO ABS: 1.1 10*3/uL — AB (ref 0.1–1.0)
MONOS PCT: 5 %
Neutro Abs: 17.1 10*3/uL — ABNORMAL HIGH (ref 1.7–7.7)
Neutrophils Relative %: 81 %
PLATELETS: 345 10*3/uL (ref 150–400)
RBC: 4.23 MIL/uL (ref 3.87–5.11)
RDW: 14.3 % (ref 11.5–15.5)
WBC: 21.4 10*3/uL — ABNORMAL HIGH (ref 4.0–10.5)

## 2018-03-09 LAB — ANTI-DNA ANTIBODY, DOUBLE-STRANDED: ds DNA Ab: <1 IU/mL (ref 0–9)

## 2018-03-09 LAB — MAGNESIUM: MAGNESIUM: 2.3 mg/dL (ref 1.7–2.4)

## 2018-03-09 LAB — C3 COMPLEMENT: C3 Complement: 169 mg/dL — ABNORMAL HIGH (ref 82–167)

## 2018-03-09 LAB — LEGIONELLA PNEUMOPHILA SEROGP 1 UR AG: L. PNEUMOPHILA SEROGP 1 UR AG: NEGATIVE

## 2018-03-09 LAB — C4 COMPLEMENT: COMPLEMENT C4, BODY FLUID: 66 mg/dL — AB (ref 14–44)

## 2018-03-09 LAB — PHOSPHORUS: PHOSPHORUS: 2.8 mg/dL (ref 2.5–4.6)

## 2018-03-09 MED ORDER — BENZONATATE 100 MG PO CAPS
100.0000 mg | ORAL_CAPSULE | Freq: Three times a day (TID) | ORAL | Status: DC
  Administered 2018-03-09 – 2018-03-10 (×3): 100 mg via ORAL
  Filled 2018-03-09 (×3): qty 1

## 2018-03-09 MED ORDER — INSULIN ASPART 100 UNIT/ML ~~LOC~~ SOLN
0.0000 [IU] | Freq: Three times a day (TID) | SUBCUTANEOUS | Status: DC
  Administered 2018-03-09: 7 [IU] via SUBCUTANEOUS
  Administered 2018-03-09: 3 [IU] via SUBCUTANEOUS
  Administered 2018-03-10: 2 [IU] via SUBCUTANEOUS
  Administered 2018-03-10: 1 [IU] via SUBCUTANEOUS
  Administered 2018-03-10: 3 [IU] via SUBCUTANEOUS

## 2018-03-09 MED ORDER — HYDROCOD POLST-CPM POLST ER 10-8 MG/5ML PO SUER
5.0000 mL | Freq: Two times a day (BID) | ORAL | Status: DC | PRN
  Administered 2018-03-10 – 2018-03-11 (×4): 5 mL via ORAL
  Filled 2018-03-09 (×4): qty 5

## 2018-03-09 MED ORDER — INSULIN ASPART 100 UNIT/ML ~~LOC~~ SOLN: 0.0000 [IU] | Freq: Every day | SUBCUTANEOUS | Status: DC

## 2018-03-09 MED ORDER — LIDOCAINE 5 % EX PTCH
1.0000 | MEDICATED_PATCH | CUTANEOUS | Status: DC
  Administered 2018-03-09 – 2018-03-11 (×3): 1 via TRANSDERMAL
  Filled 2018-03-09 (×3): qty 1

## 2018-03-09 MED ORDER — GUAIFENESIN ER 600 MG PO TB12
1200.0000 mg | ORAL_TABLET | Freq: Two times a day (BID) | ORAL | Status: DC
  Administered 2018-03-09 – 2018-03-11 (×5): 1200 mg via ORAL
  Filled 2018-03-09 (×5): qty 2

## 2018-03-09 MED ORDER — IPRATROPIUM-ALBUTEROL 0.5-2.5 (3) MG/3ML IN SOLN
3.0000 mL | Freq: Two times a day (BID) | RESPIRATORY_TRACT | Status: DC
Start: 2018-03-09 — End: 2018-03-11
  Administered 2018-03-09 – 2018-03-11 (×4): 3 mL via RESPIRATORY_TRACT
  Filled 2018-03-09 (×4): qty 3

## 2018-03-09 NOTE — Progress Notes (Signed)
PROGRESS NOTE    Donna [name] [facility]  OMV:[id number] DOB: Mar 26, 1971 DOA: 03/07/2018 PCP: Patient, No Pcp Per   Brief Narrative:  Donna [name] is a 47 y.o. female with medical history significant of lupus, right upper extremity DVT, endometriosis, and seizures and other comorbids who presented with complaints of cough and shortness of breath for last 1.5 weeks.  Patient reports having a nonproductive cough and being completely out of breath with any kind of movement.  Associated symptoms include wheezing, chest tightness, posttussive emesis, headache, rash (face, back, and legs), fever up to 101 F, joint pain/ swelling, and generalized malaise.  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 Denied having any rhinorrhea, sore throat, diarrhea, or blood in stool/urine.  She previously was being seen at [place], but reports having a falling out with them over 1-2 years ago, and has not been on any treatment or medication since that time.  Her sister had set her up with appointment to see a physician assistant at [facility] so that she can have a referral to a new Rheumatologist.  In the ED the patient was given 125 mg of Solu-Medrol, hour-long albuterol breathing treatment, 2 g of magnesium sulfate, and 1 L of normal saline IV fluids. CT of the Abdomen showed no CT evidence of acute intra-abdminal or pelvic process and a normal appendix but CTA of Chest showed no PE but did show Bibasilar Atelectasis/Scarring vs. possible Infiltrate. She was started on IV Abx. She continues to cough significantly   Assessment & Plan:   Principal Problem:   Bronchitis Active Problems:   Lupus   Abnormal EKG   History of DVT (deep vein thrombosis)  Acute Bronchitis/Community Acquired PNA -Patient presented with nonproductive cough and shortness of breath.   -Chest x-ray with poor inspiratory effort, but did  not show any acute abnormalities.  -CTA showed no CT evidence of Central Pulmonary Artery Embolus, mild Cardiomegaly, and Bibasilar Atelectasis/Scarring vs. Infiltrate.  -Placed in Telemetry Bed -Influenza A/B PCR Negative and Respiratory Virus Negative -Procalcitonin was <0.10 -Strep Pneumo Urinary Ag and Legionella Pneumophilia Ag Negative  -Solu-Medrol IV changed to po Prednisone 40 mg Daily  -C/w DuoNeb 3 mL Neb BID and 3 mL Neb q4hprn Wheezing/SOB -CTA Negative  -Increased Mucinex 1200 mg po BID; C/w Benzonatate 100 mg po TID, D/C'd Guaifenesin-Dextromethorphan and Started Tussinex -Added Incentive Spirometry and Flutter Valve -C/w Azithromycin and Ceftriaxone  -If not significantly improving may benefit from Pulmonary Consultation   SLE -Patient presents with complaints of rash and diffuse joint pain and swelling.  -Previously treated with Plaquenil at [facility], but she has not been on any medications in over 2 years. Currently going to be seen at [facility]  -CRP was <0.8, ESR was 19; C3 Complement was slightly elevated 169; C4 Complement was elevated at 66 -ds DNA Ab was <1 -Urinalysis done showed Moderate Hgb, Positive Nitrite, Rare Bacteria -C/w Benadryl 25 mg po q6hprn, and Pain control with Hydrocodone-Acetaminophen 1 tab po q6hprn and Hydromorphone 0.5 mg q3hprn Severe Pain  -C/w Prednisone 40 mg po daily  -Patient needs to have referral to establish care with a rheumatologist  Diabetes Mellitus Type 2 -HbA1c was 7.2 -CBG's ranging from 121-203 -C/w Sensitive Novolog SSI AC/HS -Consult Diabetic Education Coordinator for Evaluation  Abnormal EKG -Patient presented with sinus tachycardia with diffuse ST depressions noted and prolonged QTC of 493. -  Repeat EKG improved slightly and QTC was 479 -Trended Cardiac Troponins and was <0.03 x 2 -ECHOCardiogram showed Normal LV systolic function; mild diastolic dysfunction; mild LVH  SIRS - Acute. Patient noted to be  afebrile(reported previous fever PTA), tachycardic and tachypneic on admission, but a lactic acid was not initially obtained on initial workup.   -As Above -Blood Cx never obtained on Admission and will obtain now   Leukocytosis, worsening  -Likely Mediated by Steroid Demargination -WBC went from 9.0 -> 12.6 -> 21.4 -IV Solumedrol changed to po Prednisone 40 mg po Daily  -Continue to Monitor and Repeat CBC in AM   History of DVT -Patient with a history of right upper extremity DVT for which she was treated with anticoagulants for 1 year and then discontinued. -Recent CTA of Chest showed PE  History of Seizure Disorder -Patient not on any anti-elliptic medications.  DVT prophylaxis: Enoxaparin 40 mg sq q24h  Code Status: FULL CODE Family Communication: No family present at bedside  Disposition Plan: Remain Inpatient for continued Treatment and evaluation   Consultants:   None   Procedures:  ECHOCARDIOGRAM ------------------------------------------------------------------- Study Conclusions  - Left ventricle: The cavity size was normal. Wall thickness was   increased in a pattern of mild LVH. Systolic function was normal.   The estimated ejection fraction was in the range of 60% to 65%.   Wall motion was normal; there were no regional wall motion   abnormalities. Doppler parameters are consistent with abnormal   left ventricular relaxation (grade 1 diastolic dysfunction).  Impressions:  - Normal LV systolic function; mild diastolic dysfunction; mild   LVH.   Antimicrobials:  Anti-infectives (From admission, onward)   Start     Dose/Rate Route Frequency Ordered Stop   03/08/18 1000  azithromycin (ZITHROMAX) 500 mg in sodium chloride 0.9 % 250 mL IVPB     500 mg 250 mL/hr over 60 Minutes Intravenous Every 24 hours 03/08/18 0857     03/08/18 1000  cefTRIAXone (ROCEPHIN) 1 g in sodium chloride 0.9 % 100 mL IVPB     1 g 200 mL/hr over 30 Minutes Intravenous Every 24  hours 03/08/18 0912       Subjective: Seen and examined and had uncontrolled coughing with Chest pain associated with coughing. No nausea or vomiting. Felt ok But still dyspneic. No lightheadedness or dizziness. No other concerns or complaints at this time.   Objective: Vitals:   03/08/18 2134 03/09/18 0517 03/09/18 0911 03/09/18 1353  BP: (!) 142/78 133/83  128/82  Pulse:  93  92  Resp:  (!) 24  18  Temp:  97.6 F (36.4 C)  98.5 F (36.9 C)  TempSrc:  Oral  Oral  SpO2:  98% 99% 96%  Weight:      Height:       No intake or output data in the 24 hours ending 03/09/18 1743 Filed Weights   03/07/18 1814 03/08/18 0120  Weight: 77.1 kg (170 lb) 77.4 kg (170 lb 10.2 oz)   Examination: Physical Exam:  Constitutional: WN/WD obese AAF in NAD and appears uncomfortable from coughing  Eyes: Lids and conjunctivae normal, sclerae anicteric  ENMT: External Ears, Nose appear normal. Grossly normal hearing. Mucous membranes are moist.  Neck: Appears normal, supple, no cervical masses, normal ROM, no appreciable thyromegaly; no appreciable JVD Respiratory: Diminished to auscultation bilaterally, no wheezing, but mild rhonchi. Normal respiratory effort and patient is not tachypenic. No accessory muscle use. Significant coughing  Cardiovascular: RRR, no murmurs /   rubs / gallops. S1 and S2 auscultated. No extremity edema. Abdomen: Soft, non-tender, non-distended. No masses palpated. No appreciable hepatosplenomegaly. Bowel sounds positive x4.  GU: Deferred. Musculoskeletal: No clubbing / cyanosis of digits/nails. No joint deformity upper and lower extremities. Good ROM, no contractures. Skin: No induration; Warm and dry. No appreciable lesions noted on a limited skin eval Neurologic: CN 2-12 grossly intact with no focal deficits. Sensation intact in all 4 Extremities. Romberg sign and cerebellar reflexes not assessed.  Psychiatric: Normal judgment and insight. Alert and oriented x 3. Normal mood  and appropriate affect.   Data Reviewed: I have personally reviewed following labs and imaging studies  CBC: Recent Labs  Lab 03/07/18 2106 03/08/18 0715 03/09/18 0816  WBC 9.0 12.6* 21.4*  NEUTROABS  --   --  17.1*  HGB 12.6 11.5* 11.3*  HCT 38.9 36.1 36.2  MCV 83.7 83.6 85.6  PLT 338 337 345   Basic Metabolic Panel: Recent Labs  Lab 03/07/18 2106 03/08/18 0715 03/09/18 0816  NA 143 137 139  K 3.7 4.3 4.1  CL 104 104 106  CO2 28 23 24  GLUCOSE 112* 304* 130*  BUN 16 14 15  CREATININE 0.79 0.87 0.73  CALCIUM 9.9 9.1 9.2  MG  --   --  2.3  PHOS  --   --  2.8   GFR: Estimated Creatinine Clearance: 80.9 mL/min (by C-G formula based on SCr of 0.73 mg/dL). Liver Function Tests: Recent Labs  Lab 03/07/18 2106 03/09/18 0816  AST 26 26  ALT 37 26  ALKPHOS 122 102  BILITOT 0.2* 0.5  PROT 8.6* 7.4  ALBUMIN 4.7 4.2   Recent Labs  Lab 03/07/18 2106  LIPASE 26   No results for input(s): AMMONIA in the last 168 hours. Coagulation Profile: No results for input(s): INR, PROTIME in the last 168 hours. Cardiac Enzymes: Recent Labs  Lab 03/08/18 0142 03/08/18 0715  TROPONINI <0.03 <0.03   BNP (last 3 results) No results for input(s): PROBNP in the last 8760 hours. HbA1C: Recent Labs    03/08/18 0715  HGBA1C 7.2*   CBG: Recent Labs  Lab 03/09/18 0822 03/09/18 1059 03/09/18 1643  GLUCAP 121* 342* 203*   Lipid Profile: No results for input(s): CHOL, HDL, LDLCALC, TRIG, CHOLHDL, LDLDIRECT in the last 72 hours. Thyroid Function Tests: No results for input(s): TSH, T4TOTAL, FREET4, T3FREE, THYROIDAB in the last 72 hours. Anemia Panel: No results for input(s): VITAMINB12, FOLATE, FERRITIN, TIBC, IRON, RETICCTPCT in the last 72 hours. Sepsis Labs: Recent Labs  Lab 03/08/18 0142  PROCALCITON <0.10    Recent Results (from the past 240 hour(s))  Respiratory Panel by PCR     Status: None   Collection Time: 03/07/18  8:36 PM  Result Value Ref Range  Status   Adenovirus NOT DETECTED NOT DETECTED Final   Coronavirus 229E NOT DETECTED NOT DETECTED Final   Coronavirus HKU1 NOT DETECTED NOT DETECTED Final   Coronavirus NL63 NOT DETECTED NOT DETECTED Final   Coronavirus OC43 NOT DETECTED NOT DETECTED Final   Metapneumovirus NOT DETECTED NOT DETECTED Final   Rhinovirus / Enterovirus NOT DETECTED NOT DETECTED Final   Influenza A NOT DETECTED NOT DETECTED Final   Influenza B NOT DETECTED NOT DETECTED Final   Parainfluenza Virus 1 NOT DETECTED NOT DETECTED Final   Parainfluenza Virus 2 NOT DETECTED NOT DETECTED Final   Parainfluenza Virus 3 NOT DETECTED NOT DETECTED Final   Parainfluenza Virus 4 NOT DETECTED NOT DETECTED Final     Respiratory Syncytial Virus NOT DETECTED NOT DETECTED Final   Bordetella pertussis NOT DETECTED NOT DETECTED Final   Chlamydophila pneumoniae NOT DETECTED NOT DETECTED Final   Mycoplasma pneumoniae NOT DETECTED NOT DETECTED Final    Comment: Performed at [facility], [place] [address], [place], [place] [address]    Radiology Studies: Ct Abdomen Pelvis Wo Contrast  Result Date: 03/07/2018 CLINICAL DATA:  Initial evaluation for acute upper abdominal pain, abdominal distension. EXAM: CT ABDOMEN AND PELVIS WITHOUT CONTRAST TECHNIQUE: Multidetector CT imaging of the abdomen and pelvis was performed following the standard protocol without IV contrast. COMPARISON:  Prior CT from 12/24/2015. FINDINGS: Lower chest: Scattered linear bibasilar opacities noted within the visualized lung bases, most consistent with atelectasis. Visualized lungs are otherwise clear. Hepatobiliary: Liver demonstrates a normal unenhanced appearance. Gallbladder within normal limits. No biliary dilatation. Pancreas: Pancreas within normal limits. Spleen: Subcentimeter hypodensity within the central aspect of the spleen noted, too small the characterize. Spleen otherwise unremarkable. Adrenals/Urinary Tract: Adrenal glands are normal. Kidneys equal in  size without nephrolithiasis or hydronephrosis. 3.5 cm cyst noted within the interpolar left kidney. No hydroureter. Partially distended bladder within normal limits. Stomach/Bowel: Stomach within normal limits. No evidence for bowel obstruction. Appendix within normal limits. No acute inflammatory changes seen about the bowels. Vascular/Lymphatic: Intra-abdominal aorta of normal caliber. No adenopathy. Reproductive: Uterus is absent.  Ovaries not discretely identified. Other: No free air or fluid. Postoperative scarring noted at the ventral abdominal wall. Musculoskeletal: No acute osseus abnormality. No worrisome lytic or blastic osseous lesions. Mild to moderate bilateral facet arthropathy noted at L4-5 and L5-S1. IMPRESSION: 1. No CT evidence for acute intra-abdominal or pelvic process. 2. Normal appendix. No other acute inflammatory changes identified within the abdomen and pelvis. Electronically Signed   By: Jeannine Boga M.D.   On: 03/07/2018 23:02   Dg Chest 2 View  Result Date: 03/07/2018 CLINICAL DATA:  Wheezing, shortness of Breath EXAM: CHEST - 2 VIEW COMPARISON:  01/17/2017 FINDINGS: Low lung volumes which accentuates heart size. No confluent airspace opacities. No effusions. No acute bony abnormality. IMPRESSION: Low lung volumes.  No active disease. Electronically Signed   By: Rolm Baptise M.D.   On: 03/07/2018 19:24   Ct Angio Chest Pe W Or Wo Contrast  Result Date: 03/08/2018 CLINICAL DATA:  47 year old female with wheezing and nonproductive cough. EXAM: CT ANGIOGRAPHY CHEST WITH CONTRAST TECHNIQUE: Multidetector CT imaging of the chest was performed using the standard protocol during bolus administration of intravenous contrast. Multiplanar CT image reconstructions and MIPs were obtained to evaluate the vascular anatomy. CONTRAST:  130m ISOVUE-370 IOPAMIDOL (ISOVUE-370) INJECTION 76% COMPARISON:  Chest radiograph dated 03/07/2018 FINDINGS: Cardiovascular: Mild cardiomegaly. No  pericardial effusion. The thoracic aorta is unremarkable. The origins of the great vessels of the aortic arch are patent. Evaluation of the pulmonary arteries is limited due to respiratory motion artifact and suboptimal opacification of the peripheral branches. No large or central pulmonary artery embolus identified. Mediastinum/Nodes: There is no hilar or mediastinal adenopathy. Esophagus is grossly unremarkable. Subcentimeter bilateral thyroid hypodense nodules. Ultrasound may provide better evaluation. Lungs/Pleura: Patchy bibasilar streaky densities and areas of consolidative changes noted which may represent atelectasis/scarring or pneumonia. Clinical correlation is recommended. There is no pleural effusion or pneumothorax. The central airways are patent. Upper Abdomen: Probable fatty liver. Musculoskeletal: No chest wall abnormality. No acute or significant osseous findings. Review of the MIP images confirms the above findings. IMPRESSION: 1. No CT evidence of central pulmonary artery embolus. 2. Mild  cardiomegaly. 3. Bibasilar atelectasis/scarring versus infiltrate. Clinical correlation is recommended. Electronically Signed   By: Arash  Radparvar M.D.   On: 03/08/2018 06:06   Scheduled Meds: . benzonatate  100 mg Oral TID  . enoxaparin (LOVENOX) injection  40 mg Subcutaneous Q24H  . guaiFENesin  1,200 mg Oral BID  . insulin aspart  0-5 Units Subcutaneous QHS  . insulin aspart  0-9 Units Subcutaneous TID WC  . ipratropium-albuterol  3 mL Nebulization BID  . lidocaine  1 patch Transdermal Q24H  . predniSONE  40 mg Oral Q breakfast   Continuous Infusions: . azithromycin Stopped (03/09/18 1324)  . cefTRIAXone (ROCEPHIN)  IV Stopped (03/09/18 1048)    LOS: 0 days   Omair Latif Sheikh, DO [facility] Pager [phone]  If 7PM-7AM, please contact night-coverage [email or web address] Password [email or web address] 03/09/2018, 5:43 PM  

## 2018-03-10 ENCOUNTER — Observation Stay (HOSPITAL_COMMUNITY): Payer: BLUE CROSS/BLUE SHIELD

## 2018-03-10 DIAGNOSIS — R05 Cough: Secondary | ICD-10-CM | POA: Diagnosis present

## 2018-03-10 DIAGNOSIS — R0602 Shortness of breath: Secondary | ICD-10-CM | POA: Diagnosis not present

## 2018-03-10 DIAGNOSIS — R9431 Abnormal electrocardiogram [ECG] [EKG]: Secondary | ICD-10-CM | POA: Diagnosis not present

## 2018-03-10 DIAGNOSIS — J209 Acute bronchitis, unspecified: Secondary | ICD-10-CM | POA: Diagnosis present

## 2018-03-10 DIAGNOSIS — Z820 Family history of epilepsy and other diseases of the nervous system: Secondary | ICD-10-CM | POA: Diagnosis not present

## 2018-03-10 DIAGNOSIS — G40909 Epilepsy, unspecified, not intractable, without status epilepticus: Secondary | ICD-10-CM | POA: Diagnosis present

## 2018-03-10 DIAGNOSIS — L930 Discoid lupus erythematosus: Secondary | ICD-10-CM | POA: Diagnosis not present

## 2018-03-10 DIAGNOSIS — E739 Lactose intolerance, unspecified: Secondary | ICD-10-CM | POA: Diagnosis present

## 2018-03-10 DIAGNOSIS — Z823 Family history of stroke: Secondary | ICD-10-CM | POA: Diagnosis not present

## 2018-03-10 DIAGNOSIS — M329 Systemic lupus erythematosus, unspecified: Secondary | ICD-10-CM | POA: Diagnosis present

## 2018-03-10 DIAGNOSIS — J45901 Unspecified asthma with (acute) exacerbation: Secondary | ICD-10-CM | POA: Diagnosis present

## 2018-03-10 DIAGNOSIS — E119 Type 2 diabetes mellitus without complications: Secondary | ICD-10-CM | POA: Diagnosis present

## 2018-03-10 DIAGNOSIS — Z86718 Personal history of other venous thrombosis and embolism: Secondary | ICD-10-CM | POA: Diagnosis not present

## 2018-03-10 DIAGNOSIS — Z8041 Family history of malignant neoplasm of ovary: Secondary | ICD-10-CM | POA: Diagnosis not present

## 2018-03-10 DIAGNOSIS — Z886 Allergy status to analgesic agent status: Secondary | ICD-10-CM | POA: Diagnosis not present

## 2018-03-10 DIAGNOSIS — Z91030 Bee allergy status: Secondary | ICD-10-CM | POA: Diagnosis not present

## 2018-03-10 DIAGNOSIS — Z8673 Personal history of transient ischemic attack (TIA), and cerebral infarction without residual deficits: Secondary | ICD-10-CM | POA: Diagnosis not present

## 2018-03-10 DIAGNOSIS — J40 Bronchitis, not specified as acute or chronic: Secondary | ICD-10-CM | POA: Diagnosis not present

## 2018-03-10 DIAGNOSIS — Z90710 Acquired absence of both cervix and uterus: Secondary | ICD-10-CM | POA: Diagnosis not present

## 2018-03-10 DIAGNOSIS — J189 Pneumonia, unspecified organism: Secondary | ICD-10-CM | POA: Diagnosis present

## 2018-03-10 DIAGNOSIS — Z8249 Family history of ischemic heart disease and other diseases of the circulatory system: Secondary | ICD-10-CM | POA: Diagnosis not present

## 2018-03-10 DIAGNOSIS — Z888 Allergy status to other drugs, medicaments and biological substances status: Secondary | ICD-10-CM | POA: Diagnosis not present

## 2018-03-10 DIAGNOSIS — Z803 Family history of malignant neoplasm of breast: Secondary | ICD-10-CM | POA: Diagnosis not present

## 2018-03-10 LAB — CBC WITH DIFFERENTIAL/PLATELET
BASOS PCT: 0 %
Basophils Absolute: 0 10*3/uL (ref 0.0–0.1)
Eosinophils Absolute: 0.1 10*3/uL (ref 0.0–0.7)
Eosinophils Relative: 1 %
HEMATOCRIT: 36.8 % (ref 36.0–46.0)
HEMOGLOBIN: 11.5 g/dL — AB (ref 12.0–15.0)
LYMPHS PCT: 27 %
Lymphs Abs: 3.9 10*3/uL (ref 0.7–4.0)
MCH: 26.4 pg (ref 26.0–34.0)
MCHC: 31.3 g/dL (ref 30.0–36.0)
MCV: 84.6 fL (ref 78.0–100.0)
MONO ABS: 0.8 10*3/uL (ref 0.1–1.0)
Monocytes Relative: 6 %
NEUTROS ABS: 9.7 10*3/uL — AB (ref 1.7–7.7)
NEUTROS PCT: 66 %
Platelets: 342 10*3/uL (ref 150–400)
RBC: 4.35 MIL/uL (ref 3.87–5.11)
RDW: 13.8 % (ref 11.5–15.5)
WBC: 14.6 10*3/uL — ABNORMAL HIGH (ref 4.0–10.5)

## 2018-03-10 LAB — COMPREHENSIVE METABOLIC PANEL
ALBUMIN: 4.1 g/dL (ref 3.5–5.0)
ALT: 28 U/L (ref 14–54)
AST: 23 U/L (ref 15–41)
Alkaline Phosphatase: 96 U/L (ref 38–126)
Anion gap: 8 (ref 5–15)
BILIRUBIN TOTAL: 0.6 mg/dL (ref 0.3–1.2)
BUN: 19 mg/dL (ref 6–20)
CALCIUM: 9.1 mg/dL (ref 8.9–10.3)
CO2: 27 mmol/L (ref 22–32)
Chloride: 104 mmol/L (ref 101–111)
Creatinine, Ser: 0.77 mg/dL (ref 0.44–1.00)
GFR calc Af Amer: >60 mL/min (ref >60)
GFR calc non Af Amer: >60 mL/min (ref >60)
GLUCOSE: 127 mg/dL — AB (ref 65–99)
POTASSIUM: 3.8 mmol/L (ref 3.5–5.1)
Sodium: 139 mmol/L (ref 135–145)
TOTAL PROTEIN: 7.4 g/dL (ref 6.5–8.1)

## 2018-03-10 LAB — GLUCOSE, CAPILLARY
GLUCOSE-CAPILLARY: 223 mg/dL — AB (ref 65–99)
GLUCOSE-CAPILLARY: 181 mg/dL — AB (ref 65–99)
Glucose-Capillary: 132 mg/dL — ABNORMAL HIGH (ref 65–99)
Glucose-Capillary: 255 mg/dL — ABNORMAL HIGH (ref 65–99)
Glucose-Capillary: 194 mg/dL — ABNORMAL HIGH (ref 65–99)
Glucose-Capillary: 320 mg/dL — ABNORMAL HIGH (ref 65–99)

## 2018-03-10 LAB — PHOSPHORUS: Phosphorus: 3.5 mg/dL (ref 2.5–4.6)

## 2018-03-10 LAB — MAGNESIUM: Magnesium: 2.2 mg/dL (ref 1.7–2.4)

## 2018-03-10 MED ORDER — INSULIN ASPART 100 UNIT/ML ~~LOC~~ SOLN
0.0000 [IU] | Freq: Three times a day (TID) | SUBCUTANEOUS | Status: DC
  Administered 2018-03-11: 3 [IU] via SUBCUTANEOUS

## 2018-03-10 MED ORDER — BENZONATATE 100 MG PO CAPS
200.0000 mg | ORAL_CAPSULE | Freq: Three times a day (TID) | ORAL | Status: DC
  Administered 2018-03-10 – 2018-03-11 (×3): 200 mg via ORAL
  Filled 2018-03-10 (×3): qty 2

## 2018-03-10 MED ORDER — INSULIN ASPART 100 UNIT/ML ~~LOC~~ SOLN: 0.0000 [IU] | Freq: Every day | SUBCUTANEOUS | Status: DC

## 2018-03-10 MED ORDER — LIVING WELL WITH DIABETES BOOK
Freq: Once | Status: AC
  Administered 2018-03-10: 16:00:00
  Filled 2018-03-10: qty 1

## 2018-03-10 NOTE — Progress Notes (Signed)
PROGRESS NOTE    Donna [name] [facility]  KZS:[id number] DOB: 1971/04/30 DOA: 03/07/2018 PCP: Patient, No Pcp Per   Brief Narrative:  Donna [name] is a 47 y.o. female with medical history significant of lupus, right upper extremity DVT, endometriosis, and seizures and other comorbids who presented with complaints of cough and shortness of breath for last 1.5 weeks.  Patient reports having a nonproductive cough and being completely out of breath with any kind of movement.  Associated symptoms include wheezing, chest tightness, posttussive emesis, headache, rash (face, back, and legs), fever up to 101 F, joint pain/ swelling, and generalized malaise.  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 Denied having any rhinorrhea, sore throat, diarrhea, or blood in stool/urine.  She previously was being seen at [facility], but reports having a falling out with them over 1-2 years ago, and has not been on any treatment or medication since that time.  Her sister had set her up with appointment to see a physician assistant at [facility] so that she can have a referral to a new Rheumatologist.  In the ED the patient was given 125 mg of Solu-Medrol, hour-long albuterol breathing treatment, 2 g of magnesium sulfate, and 1 L of normal saline IV fluids. CT of the Abdomen showed no CT evidence of acute intra-abdminal or pelvic process and a normal appendix but CTA of Chest showed no PE but did show Bibasilar Atelectasis/Scarring vs. possible Infiltrate. She was started on IV Abx. She continues to cough significantly but is improving with treatment. States her cough has subsided some and has become more productive.   Assessment & Plan:   Principal Problem:   Bronchitis Active Problems:   Lupus   Abnormal EKG   History of DVT (deep vein thrombosis)   Acute bronchitis  Acute Bronchitis/Community Acquired  PNA -Patient presented with nonproductive cough and shortness of breath.   -Chest x-ray with poor inspiratory effort, but did not show any acute abnormalities.  -CTA showed no CT evidence of Central Pulmonary Artery Embolus, mild Cardiomegaly, and Bibasilar Atelectasis/Scarring vs. Infiltrate.  -Placed in Telemetry Bed -Influenza A/B PCR Negative and Respiratory Virus Negative -Procalcitonin was <0.10 -Strep Pneumo Urinary Ag and Legionella Pneumophilia Ag Negative  -Solu-Medrol IV changed to po Prednisone 40 mg Daily and will continue for now   -C/w DuoNeb 3 mL Neb BID and 3 mL Neb q4hprn Wheezing/SOB -CTA Negative for PE -Increased Mucinex 1200 mg po BID; C/w Benzonatate 100 mg po TID, D/C'd Guaifenesin-Dextromethorphan and Started Tussinex; Patient states she gets some cough relief from Tussinex -Added Incentive Spirometry and Flutter Valve -C/w Azithromycin and Ceftriaxone for now -Check Ambulatory Pulse Oximetry Screen in AM  -If not significantly improving may benefit from Pulmonary Consultation   SLE -Patient presented with complaints of rash and diffuse joint pain and swelling.  -Previously treated with Plaquenil at [facility], but she has not been on any medications in over 2 years. Currently going to be seen at [facility]  -CRP was <0.8, ESR was 19; C3 Complement was slightly elevated 169; C4 Complement was elevated at 66 -ds DNA Ab was <1 -Urinalysis done showed Moderate Hgb, Positive Nitrite, Rare Bacteria -C/w Benadryl 25 mg po q6hprn, and Pain control with Hydrocodone-Acetaminophen 1 tab po q6hprn and Hydromorphone 0.5 mg q3hprn Severe Pain  -C/w Prednisone 40 mg po daily for now -Patient needs to have referral to  establish care with a Rheumatologist  Diabetes Mellitus Type 2 -HbA1c was 7.2 -CBG's ranging from 132-320 -Increase Sensitive Novolog SSI AC/HS to Moderate Scale -Consult Diabetic Education Coordinator for Evaluation  Abnormal EKG -Patient presented with  sinus tachycardia with diffuse ST depressions noted and prolonged QTC of 493. -Repeat EKG improved slightly and QTC was 479 -Trended Cardiac Troponins and was <0.03 x 2 -ECHOCardiogram showed Normal LV systolic function; mild diastolic dysfunction; mild LVH -Follow up with Cardiology as an outpatient   SIRS - Acute. Patient noted to be afebrile(reported previous fever PTA), tachycardic and tachypneic on admission, but a lactic acid was not initially obtained on initial workup.   -As Above -Blood Cx obtained and showed NGTD <24 hours  Leukocytosis -Likely Mediated by Steroid Demargination -WBC went from 9.0 -> 12.6 -> 21.4 -> 14.6 -IV Solumedrol changed to po Prednisone 40 mg po Daily  -Continue to Monitor and Repeat CBC in AM   History of DVT -Patient with a history of right upper extremity DVT for which she was treated with anticoagulants for 1 year and then discontinued. -Recent CTA of Chest showed PE  History of Seizure Disorder -Patient not on any anti-elliptic medications. -Continue to Monitor   DVT prophylaxis: Enoxaparin 40 mg sq q24h  Code Status: FULL CODE Family Communication: No family present at bedside  Disposition Plan: Remain Inpatient for continued Treatment and evaluation and Anticipated D/C Home in Next 24-48 hours  Consultants:   None   Procedures:  ECHOCARDIOGRAM ------------------------------------------------------------------- Study Conclusions  - Left ventricle: The cavity size was normal. Wall thickness was   increased in a pattern of mild LVH. Systolic function was normal.   The estimated ejection fraction was in the range of 60% to 65%.   Wall motion was normal; there were no regional wall motion   abnormalities. Doppler parameters are consistent with abnormal   left ventricular relaxation (grade 1 diastolic dysfunction).  Impressions:  - Normal LV systolic function; mild diastolic dysfunction; mild   LVH.   Antimicrobials:    Anti-infectives (From admission, onward)   Start     Dose/Rate Route Frequency Ordered Stop   03/08/18 1000  azithromycin (ZITHROMAX) 500 mg in sodium chloride 0.9 % 250 mL IVPB     500 mg 250 mL/hr over 60 Minutes Intravenous Every 24 hours 03/08/18 0857     03/08/18 1000  cefTRIAXone (ROCEPHIN) 1 g in sodium chloride 0.9 % 100 mL IVPB     1 g 200 mL/hr over 30 Minutes Intravenous Every 24 hours 03/08/18 0912       Subjective: Seen and examined and coughing had improved. CP had improved with lidocaine patch, Still SOB but improved. States cough is more productive and that Tussinex helps.    Objective: Vitals:   03/09/18 2026 03/10/18 0543 03/10/18 0904 03/10/18 1426  BP:  114/64  (!) 131/97  Pulse:  85  92  Resp:  18  20  Temp:  97.9 F (36.6 C)  97.7 F (36.5 C)  TempSrc:  Oral  Oral  SpO2: 98% 96% 96% 97%  Weight:      Height:        Intake/Output Summary (Last 24 hours) at 03/10/2018 1901 Last data filed at 03/10/2018 1300 Gross per 24 hour  Intake 480 ml  Output -  Net 480 ml   Filed Weights   03/07/18 1814 03/08/18 0120  Weight: 77.1 kg (170 lb) 77.4 kg (170 lb 10.2 oz)   Examination: Physical Exam:  Constitutional:  WN/WD obese AAF in NAD appears somewhat improved from yesterday Eyes: Sclerae anicteric. Lids normal ENMT: External Ears and nose appear normal Neck: Appears Supple with no JVD Respiratory: Diminished to auscultation; Still coughing but not as bad. Very slight expiratory wheezing but no appreciable rhonchi or rales. Unlabored breathing Cardiovascular: RRR; No appreciable m/r/g.  Abdomen: Soft, NT, Distended due to body habitus GU: Deferred Musculoskeletal: No contractures; No cyanois Skin: Warm and Dry; No appreciable rashes or lesions Neurologic: CN 2-12 grossly intact. Psychiatric: Normal and pleasant mood and affect. Intact judgement and insight  Data Reviewed: I have personally reviewed following labs and imaging studies  CBC: Recent  Labs  Lab 03/07/18 2106 03/08/18 0715 03/09/18 0816 03/10/18 0549  WBC 9.0 12.6* 21.4* 14.6*  NEUTROABS  --   --  17.1* 9.7*  HGB 12.6 11.5* 11.3* 11.5*  HCT 38.9 36.1 36.2 36.8  MCV 83.7 83.6 85.6 84.6  PLT 338 337 345 656   Basic Metabolic Panel: Recent Labs  Lab 03/07/18 2106 03/08/18 0715 03/09/18 0816 03/10/18 0549  NA 143 137 139 139  K 3.7 4.3 4.1 3.8  CL 104 104 106 104  CO2 '28 23 24 27  '$ GLUCOSE 112* 304* 130* 127*  BUN '16 14 15 19  '$ CREATININE 0.79 0.87 0.73 0.77  CALCIUM 9.9 9.1 9.2 9.1  MG  --   --  2.3 2.2  PHOS  --   --  2.8 3.5   GFR: Estimated Creatinine Clearance: 80.9 mL/min (by C-G formula based on SCr of 0.77 mg/dL). Liver Function Tests: Recent Labs  Lab 03/07/18 2106 03/09/18 0816 03/10/18 0549  AST '26 26 23  '$ ALT 37 26 28  ALKPHOS 122 102 96  BILITOT 0.2* 0.5 0.6  PROT 8.6* 7.4 7.4  ALBUMIN 4.7 4.2 4.1   Recent Labs  Lab 03/07/18 2106  LIPASE 26   No results for input(s): AMMONIA in the last 168 hours. Coagulation Profile: No results for input(s): INR, PROTIME in the last 168 hours. Cardiac Enzymes: Recent Labs  Lab 03/08/18 0142 03/08/18 0715  TROPONINI <0.03 <0.03   BNP (last 3 results) No results for input(s): PROBNP in the last 8760 hours. HbA1C: Recent Labs    03/08/18 0715  HGBA1C 7.2*   CBG: Recent Labs  Lab 03/10/18 0743 03/10/18 1206 03/10/18 1425 03/10/18 1601 03/10/18 1822  GLUCAP 132* 255* 194* 320* 223*   Lipid Profile: No results for input(s): CHOL, HDL, LDLCALC, TRIG, CHOLHDL, LDLDIRECT in the last 72 hours. Thyroid Function Tests: No results for input(s): TSH, T4TOTAL, FREET4, T3FREE, THYROIDAB in the last 72 hours. Anemia Panel: No results for input(s): VITAMINB12, FOLATE, FERRITIN, TIBC, IRON, RETICCTPCT in the last 72 hours. Sepsis Labs: Recent Labs  Lab 03/08/18 0142  PROCALCITON <0.10    Recent Results (from the past 240 hour(s))  Respiratory Panel by PCR     Status: None    Collection Time: 03/07/18  8:36 PM  Result Value Ref Range Status   Adenovirus NOT DETECTED NOT DETECTED Final   Coronavirus 229E NOT DETECTED NOT DETECTED Final   Coronavirus HKU1 NOT DETECTED NOT DETECTED Final   Coronavirus NL63 NOT DETECTED NOT DETECTED Final   Coronavirus OC43 NOT DETECTED NOT DETECTED Final   Metapneumovirus NOT DETECTED NOT DETECTED Final   Rhinovirus / Enterovirus NOT DETECTED NOT DETECTED Final   Influenza A NOT DETECTED NOT DETECTED Final   Influenza B NOT DETECTED NOT DETECTED Final   Parainfluenza Virus 1 NOT DETECTED NOT DETECTED Final  Parainfluenza Virus 2 NOT DETECTED NOT DETECTED Final   Parainfluenza Virus 3 NOT DETECTED NOT DETECTED Final   Parainfluenza Virus 4 NOT DETECTED NOT DETECTED Final   Respiratory Syncytial Virus NOT DETECTED NOT DETECTED Final   Bordetella pertussis NOT DETECTED NOT DETECTED Final   Chlamydophila pneumoniae NOT DETECTED NOT DETECTED Final   Mycoplasma pneumoniae NOT DETECTED NOT DETECTED Final    Comment: Performed at [facility], [place] [address] [place], [place] [address]  Culture, blood (routine x 2)     Status: None (Preliminary result)   Collection Time: 03/09/18  5:47 PM  Result Value Ref Range Status   Specimen Description   Final    BLOOD RIGHT ANTECUBITAL Performed at [facility] [address], [place], [place] [address]    Special Requests   Final    BOTTLES DRAWN AEROBIC ONLY Blood Culture adequate volume Performed at [facility] [address], [place], [place] [address]    Culture   Final    NO GROWTH < 24 HOURS Performed at [facility] [address], [place], [place] [address]    Report Status PENDING  Incomplete  Culture, blood (routine x 2)     Status: None (Preliminary result)   Collection Time: 03/09/18  6:06 PM  Result Value Ref Range Status   Specimen Description   Final    BLOOD RIGHT HAND Performed at [facility] [address] [place], [place] [address]    Special Requests   Final    BOTTLES DRAWN AEROBIC ONLY Blood Culture adequate volume Performed at [facility] [address], [place], [place] [address]    Culture   Final    NO GROWTH < 24 HOURS Performed at [facility] [address], [place], [place] [address]    Report Status PENDING  Incomplete    Radiology Studies: Dg Chest Port 1 View  Result Date: 03/10/2018 CLINICAL DATA:  Short of breath EXAM: PORTABLE CHEST 1 VIEW COMPARISON:  03/07/2018 FINDINGS: Cardiac enlargement without heart failure or edema. No effusion. Lungs now clear. IMPRESSION: No active disease. Electronically Signed   By: Franchot Gallo M.D.   On: 03/10/2018 07:20   Scheduled Meds: . benzonatate  200 mg Oral TID  . enoxaparin (LOVENOX) injection  40 mg Subcutaneous Q24H  . guaiFENesin  1,200 mg Oral BID  . insulin aspart  0-5 Units Subcutaneous QHS  . insulin aspart  0-9 Units Subcutaneous TID WC  . ipratropium-albuterol  3 mL Nebulization BID  . lidocaine  1 patch Transdermal Q24H  . predniSONE  40 mg Oral Q breakfast   Continuous Infusions: . azithromycin Stopped (03/10/18 1250)  . cefTRIAXone (ROCEPHIN)  IV Stopped (03/10/18 1015)    LOS: 0 days   Kerney Elbe, DO [facility] Pager [phone]  If 7PM-7AM, please contact night-coverage [email or web address] Password [facility] 03/10/2018, 7:01 PM

## 2018-03-11 DIAGNOSIS — E119 Type 2 diabetes mellitus without complications: Secondary | ICD-10-CM

## 2018-03-11 DIAGNOSIS — L930 Discoid lupus erythematosus: Secondary | ICD-10-CM

## 2018-03-11 LAB — CBC WITH DIFFERENTIAL/PLATELET
BASOS ABS: 0 10*3/uL (ref 0.0–0.1)
BASOS PCT: 0 %
EOS PCT: 1 %
Eosinophils Absolute: 0.2 10*3/uL (ref 0.0–0.7)
HEMATOCRIT: 37.5 % (ref 36.0–46.0)
Hemoglobin: 12 g/dL (ref 12.0–15.0)
LYMPHS PCT: 31 %
Lymphs Abs: 4.2 10*3/uL — ABNORMAL HIGH (ref 0.7–4.0)
MCH: 26.8 pg (ref 26.0–34.0)
MCHC: 32 g/dL (ref 30.0–36.0)
MCV: 83.7 fL (ref 78.0–100.0)
MONO ABS: 0.9 10*3/uL (ref 0.1–1.0)
Monocytes Relative: 7 %
NEUTROS ABS: 8.3 10*3/uL — AB (ref 1.7–7.7)
Neutrophils Relative %: 61 %
PLATELETS: 351 10*3/uL (ref 150–400)
RBC: 4.48 MIL/uL (ref 3.87–5.11)
RDW: 13.6 % (ref 11.5–15.5)
WBC: 13.6 10*3/uL — ABNORMAL HIGH (ref 4.0–10.5)

## 2018-03-11 LAB — COMPREHENSIVE METABOLIC PANEL
ALBUMIN: 4.3 g/dL (ref 3.5–5.0)
ALT: 30 U/L (ref 14–54)
AST: 25 U/L (ref 15–41)
Alkaline Phosphatase: 102 U/L (ref 38–126)
Anion gap: 9 (ref 5–15)
BUN: 22 mg/dL — AB (ref 6–20)
CO2: 26 mmol/L (ref 22–32)
CREATININE: 0.71 mg/dL (ref 0.44–1.00)
Calcium: 9.2 mg/dL (ref 8.9–10.3)
Chloride: 104 mmol/L (ref 101–111)
GFR calc Af Amer: >60 mL/min (ref >60)
GFR calc non Af Amer: >60 mL/min (ref >60)
GLUCOSE: 120 mg/dL — AB (ref 65–99)
POTASSIUM: 3.8 mmol/L (ref 3.5–5.1)
Sodium: 139 mmol/L (ref 135–145)
Total Bilirubin: 0.5 mg/dL (ref 0.3–1.2)
Total Protein: 7.9 g/dL (ref 6.5–8.1)

## 2018-03-11 LAB — GLUCOSE, CAPILLARY
GLUCOSE-CAPILLARY: 167 mg/dL — AB (ref 65–99)
GLUCOSE-CAPILLARY: 192 mg/dL — AB (ref 65–99)
Glucose-Capillary: 120 mg/dL — ABNORMAL HIGH (ref 65–99)

## 2018-03-11 LAB — MAGNESIUM: Magnesium: 2.3 mg/dL (ref 1.7–2.4)

## 2018-03-11 LAB — PHOSPHORUS: Phosphorus: 4.1 mg/dL (ref 2.5–4.6)

## 2018-03-11 MED ORDER — GUAIFENESIN ER 600 MG PO TB12: 1200.0000 mg | ORAL_TABLET | Freq: Two times a day (BID) | ORAL | 0 refills | Status: DC

## 2018-03-11 MED ORDER — PREDNISONE 20 MG PO TABS: 40.0000 mg | ORAL_TABLET | Freq: Every day | ORAL | 0 refills | Status: DC

## 2018-03-11 MED ORDER — CEFPODOXIME PROXETIL 200 MG PO TABS: 200.0000 mg | ORAL_TABLET | Freq: Two times a day (BID) | ORAL | Status: DC

## 2018-03-11 MED ORDER — BLOOD GLUCOSE MONITOR KIT: PACK | 0 refills | Status: DC

## 2018-03-11 MED ORDER — BENZONATATE 200 MG PO CAPS: 200.0000 mg | ORAL_CAPSULE | Freq: Three times a day (TID) | ORAL | 0 refills | Status: DC

## 2018-03-11 MED ORDER — IPRATROPIUM-ALBUTEROL 0.5-2.5 (3) MG/3ML IN SOLN: 3.0000 mL | RESPIRATORY_TRACT | 0 refills | Status: DC | PRN

## 2018-03-11 MED ORDER — CEFPODOXIME PROXETIL 200 MG PO TABS: 200.0000 mg | ORAL_TABLET | Freq: Two times a day (BID) | ORAL | 0 refills | Status: DC

## 2018-03-11 MED ORDER — HYDROCOD POLST-CPM POLST ER 10-8 MG/5ML PO SUER: 5.0000 mL | Freq: Two times a day (BID) | ORAL | 0 refills | Status: DC | PRN

## 2018-03-11 NOTE — Progress Notes (Signed)
Inpatient Diabetes Program Recommendations  AACE/ADA: New Consensus Statement on Inpatient Glycemic Control (2015)  Target Ranges:  Prepandial:   less than 140 mg/dL      Peak postprandial:   less than 180 mg/dL (1-2 hours)      Critically ill patients:  140 - 180 mg/dL   Lab Results  Component Value Date   GLUCAP 120 (H) 03/11/2018   HGBA1C 7.2 (H) 03/08/2018    Review of Glycemic Control  Spoke with pt regarding her HgbA1C of 7.2%. Pt states she plans to exercise on a treadmill and eat a healthier diet. Pt states she has been drinking the large cartons of cranberry juice. Gave substitutes such as Crystal Light. Discussed importance of keeping check on blood sugars and following up with PCP. Will need another HgbA1C in 2-3 months. Discussed how diet, exercise and stress affect blood sugars and ideally HgbA1C would decrease to > 7%. Instructed pt to check blood sugars 2x/day at different times and record in logbook. Take logbook to PCP for review. Can likely control her diabetes with diet, exercise and small amount of weight loss.  MD - please give prescription for glucose meter and supplies. Will order OP Diabetes Education consult for new-onset DM.  Thank you. Donna [name], RD, LDN, CDE Inpatient Diabetes Coordinator [phone]

## 2018-03-11 NOTE — Discharge Summary (Signed)
Physician Discharge Summary  Donna [name] [facility] JJK:[id number] DOB: 1971-06-07 DOA: 03/07/2018  PCP: Patient, No Pcp Per  Admit date: 03/07/2018 Discharge date: 03/11/2018  Admitted From: Home Disposition: Home  Recommendations for Outpatient Follow-up:  1. Follow up with PCP in 1-2 weeks 2. Follow up with Rheumatology at [facility] 3. Follow up with Cardiology as an outpatient  4. Please obtain CMP/CBC, Mag, Phos in one week 5. Repeat CXR in 3-6 weeks  6. Please follow up on the following pending results:  Home Health: No  Equipment/Devices: None  Discharge Condition: Stable CODE STATUS: FULL CODE Diet recommendation: Heart Healthy Carb Modified Diet   Brief/Interim Summary: Donna Denio Durhamis a 47 y.o.femalewith medical history significant oflupus,right upper extremityDVT, endometriosis, and seizures and other comorbidswho presented with complaints of cough and shortness of breath for last 1.5weeks. Patient reports having a nonproductive cough and being completely out of breath with any kind of movement. Associated symptoms include wheezing, chest tightness, posttussive emesis, headache, rash (face, back,andlegs), fever up to 101 F, joint pain/swelling, and generalized malaise. 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 Deniedhaving any rhinorrhea, sore throat, diarrhea, or blood in stool/urine. She previously was being seen at [facility], but reports having a falling out with them over 1-2years ago,and has not been on any treatment or medication since that time. Her sister had set her up with appointment to see a physician assistant at [facility] so that she can have a referral to a new Rheumatologist.  In the ED the patient was given 125 mg of Solu-Medrol, hour-long albuterol breathing treatment, 2 g of magnesium sulfate, and 1 L of normal saline IV fluids. CT of the  Abdomen showed no CT evidence of acute intra-abdminal or pelvic process and a normal appendix but CTA of Chest showed no PE but did show Bibasilar Atelectasis/Scarring vs. possible Infiltrate. She was started on IV Abx that was transitioned to po. She continued to cough significantly but improved with treatment. States her cough has subsided some and has become more productive. She improved with intervention and was deemed medically stable to D/C Home. She will be given a Nebulizer for D/C and needs to follow up with PCP and Rheumatology at D/C.   Discharge Diagnoses:  Principal Problem:   Bronchitis Active Problems:   Lupus ([place])   Abnormal EKG   History of DVT (deep vein thrombosis)   Acute bronchitis  Acute Bronchitis/Community Acquired PNA, improved  -Patient presented with nonproductive cough and shortness of breath.  -Chest x-ray with poor inspiratory effort,but did not show any acute abnormalities.  -CTA showed no CT evidence of Central Pulmonary Artery Embolus, mild Cardiomegaly, and Bibasilar Atelectasis/Scarring vs. Infiltrate.  -Placed in Telemetry Bed -Influenza A/B PCR Negative and Respiratory Virus Negative -Procalcitonin was <0.10 -Strep Pneumo Urinary Ag and Legionella Pneumophilia Ag Negative  -Solu-Medrol IV changed to po Prednisone 40 mg Daily and will continue for now to complete course  -C/w DuoNeb 3 mL Neb BID and 3 mL Neb q4hprn Wheezing/SOB -CTA Negative for PE -Increased Mucinex 1200 mg po BID; C/w Benzonatate 100 mg po TID, D/C'd Guaifenesin-Dextromethorphan and Started Tussinex; Patient states she gets some cough relief from Tussinex -Added Incentive Spirometry and Flutter Valve -Changed Azithromycin and Ceftriaxone to po Abx -Checked Ambulatory Pulse Oximetry Screen in AM and did not desaturate  -Given Nebulizer at D/C  -Have outpatient Pulmonary evaluation if necessary and  repeat CXR in 3-6 weeks   SLE -Patient presented with complaints of rash  anddiffuse joint pain and swelling.  -Previously treated with Plaquenil at [facility], but shehas not been on any medications in over 2 years.Currently going to be seen at [facility] and has an appointment  -CRP was <0.8, ESR was 19; C3 Complement was slightly elevated 169; C4 Complement was elevated at 66 -ds DNA Ab was <1 -Urinalysis done showed Moderate Hgb, Positive Nitrite, Rare Bacteria -C/w Benadryl 25 mg po q6hprn, and Pain control with Hydrocodone-Acetaminophen 1 tab po q6hprn and Hydromorphone 0.5 mg q3hprn Severe Pain  -C/w Prednisone 40 mg po daily for now -Patient needs to have referral to establish care with a Rheumatologist and being arranged by sister  New Onset Diabetes Mellitus Type 2 -HbA1c was 7.2 -CBG's ranging from 120-192 -Changed Sensitive Novolog SSI AC/HS to Moderate Scale while hospitalized -Consult Diabetic Education Coordinator for Evaluation -Counseled on Diet and Exercise and will defer to PCP to start Treatment   Abnormal EKG -Patient presented with sinus tachycardia with diffuse ST depressions noted and prolonged QTC of 493. -Repeat EKG improved slightly and QTC was 479 -Trended Cardiac Troponinsand was <0.03 x 2 -ECHOCardiogram showed Normal LV systolic function; mild diastolic dysfunction; mild LVH -Follow up with Cardiology as an outpatient for further evaluation   SIRS -Acute. Patient noted to be afebrile(reportedprevious fever PTA), tachycardic and tachypneic on admission,but alactic acid was not initially obtained on initial workup.  -As Above -Blood Cx obtained and showed NGTD at 2 Days   Leukocytosis -Likely Mediated by Steroid Demargination -WBC went from 9.0 -> 12.6 -> 21.4 -> 14.6 -> 13.6 -IV Solumedrol changed to po Prednisone 40 mg po Daily and will continue at D/C  -Continue to Monitor and Repeat CBC in AM   History of DVT -Patient with a history of right upper extremity DVT for which she was treated with  anticoagulants for 1 year and then discontinued. -Recent CTA of Chest showed PE  History of Seizure Disorder -Patient not on any anti-elliptic medications. -Continue to Monitor   Discharge Instructions  Discharge Instructions    Call MD for:  difficulty breathing, headache or visual disturbances   Complete by:  As directed    Call MD for:  extreme fatigue   Complete by:  As directed    Call MD for:  hives   Complete by:  As directed    Call MD for:  persistant dizziness or light-headedness   Complete by:  As directed    Call MD for:  persistant nausea and vomiting   Complete by:  As directed    Call MD for:  redness, tenderness, or signs of infection (pain, swelling, redness, odor or green/yellow discharge around incision site)   Complete by:  As directed    Call MD for:  severe uncontrolled pain   Complete by:  As directed    Call MD for:  temperature >100.4   Complete by:  As directed    DME Nebulizer/meds   Complete by:  As directed    Patient needs a nebulizer to treat with the following condition:  Acute bronchitis   Diet - low sodium heart healthy   Complete by:  As directed    Diet Carb Modified   Complete by:  As directed    Increase activity slowly   Complete by:  As directed    Follow up with PCP and Rhuematology as an outpatient. Repeat Chest X-Ray in 3-6  weeks. Take all medications as prescribed. If symptoms change or worsen please return to the ED for evaluation.     Allergies as of 03/11/2018      Reactions   Bee Venom Anaphylaxis   Diamox [acetazolamide] Anaphylaxis   Nsaids Anaphylaxis   Omnipaque [iohexol] Anaphylaxis   Compazine [prochlorperazine] Hives   Iopamidol Hives   Lactose Intolerance (gi) Nausea And Vomiting   Reglan [metoclopramide] Hives      Medication List    TAKE these medications   acetaminophen 500 MG tablet Commonly known as:  TYLENOL Take 500-1,000 mg by mouth every 6 (six) hours as needed for mild pain, moderate pain or  headache.   benzonatate 200 MG capsule Commonly known as:  TESSALON Take 1 capsule (200 mg total) by mouth 3 (three) times daily.   blood glucose meter kit and supplies Kit Dispense based on patient and insurance preference. Use up to four times daily as directed. (FOR ICD-9 250.00, 250.01).   cefpodoxime 200 MG tablet Commonly known as:  VANTIN Take 1 tablet (200 mg total) by mouth every 12 (twelve) hours.   chlorpheniramine-HYDROcodone 10-8 MG/5ML Suer Commonly known as:  TUSSIONEX Take 5 mLs by mouth every 12 (twelve) hours as needed for cough.   cholecalciferol 1000 units tablet Commonly known as:  VITAMIN D Take 1,000 Units by mouth daily.   EPIPEN 2-PAK 0.3 mg/0.3 mL Soaj injection Generic drug:  EPINEPHrine Inject 0.3 mg into the muscle once as needed (for severe allergic reaction).   guaiFENesin 600 MG 12 hr tablet Commonly known as:  MUCINEX Take 2 tablets (1,200 mg total) by mouth 2 (two) times daily.   HYDROcodone-acetaminophen 5-325 MG tablet Commonly known as:  NORCO/VICODIN Take 1 tablet by mouth every 4 (four) hours as needed for moderate pain.   ipratropium-albuterol 0.5-2.5 (3) MG/3ML Soln Commonly known as:  DUONEB Take 3 mLs by nebulization every 4 (four) hours as needed.   methocarbamol 500 MG tablet Commonly known as:  ROBAXIN Take 1 tablet (500 mg total) by mouth every 8 (eight) hours as needed for muscle spasms.   predniSONE 20 MG tablet Commonly known as:  DELTASONE Take 2 tablets (40 mg total) by mouth daily with breakfast.   vitamin B-12 1000 MCG tablet Commonly known as:  CYANOCOBALAMIN Take 1,000 mcg by mouth daily.            Durable Medical Equipment  (From admission, onward)        Start     Ordered   03/11/18 0000  DME Nebulizer/meds    Question:  Patient needs a nebulizer to treat with the following condition  Answer:  Acute bronchitis   03/11/18 1402      Allergies  Allergen Reactions  . Bee Venom Anaphylaxis  .  Diamox [Acetazolamide] Anaphylaxis  . Nsaids Anaphylaxis  . Omnipaque [Iohexol] Anaphylaxis  . Compazine [Prochlorperazine] Hives  . Iopamidol Hives  . Lactose Intolerance (Gi) Nausea And Vomiting  . Reglan [Metoclopramide] Hives   Consultations:  None   Procedures/Studies: Ct Abdomen Pelvis Wo Contrast  Result Date: 03/07/2018 CLINICAL DATA:  Initial evaluation for acute upper abdominal pain, abdominal distension. EXAM: CT ABDOMEN AND PELVIS WITHOUT CONTRAST TECHNIQUE: Multidetector CT imaging of the abdomen and pelvis was performed following the standard protocol without IV contrast. COMPARISON:  Prior CT from 12/24/2015. FINDINGS: Lower chest: Scattered linear bibasilar opacities noted within the visualized lung bases, most consistent with atelectasis. Visualized lungs are otherwise clear. Hepatobiliary: Liver demonstrates a normal unenhanced  appearance. Gallbladder within normal limits. No biliary dilatation. Pancreas: Pancreas within normal limits. Spleen: Subcentimeter hypodensity within the central aspect of the spleen noted, too small the characterize. Spleen otherwise unremarkable. Adrenals/Urinary Tract: Adrenal glands are normal. Kidneys equal in size without nephrolithiasis or hydronephrosis. 3.5 cm cyst noted within the interpolar left kidney. No hydroureter. Partially distended bladder within normal limits. Stomach/Bowel: Stomach within normal limits. No evidence for bowel obstruction. Appendix within normal limits. No acute inflammatory changes seen about the bowels. Vascular/Lymphatic: Intra-abdominal aorta of normal caliber. No adenopathy. Reproductive: Uterus is absent.  Ovaries not discretely identified. Other: No free air or fluid. Postoperative scarring noted at the ventral abdominal wall. Musculoskeletal: No acute osseus abnormality. No worrisome lytic or blastic osseous lesions. Mild to moderate bilateral facet arthropathy noted at L4-5 and L5-S1. IMPRESSION: 1. No CT evidence  for acute intra-abdominal or pelvic process. 2. Normal appendix. No other acute inflammatory changes identified within the abdomen and pelvis. Electronically Signed   By: Jeannine Boga M.D.   On: 03/07/2018 23:02   Dg Chest 2 View  Result Date: 03/07/2018 CLINICAL DATA:  Wheezing, shortness of Breath EXAM: CHEST - 2 VIEW COMPARISON:  01/17/2017 FINDINGS: Low lung volumes which accentuates heart size. No confluent airspace opacities. No effusions. No acute bony abnormality. IMPRESSION: Low lung volumes.  No active disease. Electronically Signed   By: Rolm Baptise M.D.   On: 03/07/2018 19:24   Ct Angio Chest Pe W Or Wo Contrast  Result Date: 03/08/2018 CLINICAL DATA:  47 year old female with wheezing and nonproductive cough. EXAM: CT ANGIOGRAPHY CHEST WITH CONTRAST TECHNIQUE: Multidetector CT imaging of the chest was performed using the standard protocol during bolus administration of intravenous contrast. Multiplanar CT image reconstructions and MIPs were obtained to evaluate the vascular anatomy. CONTRAST:  114m ISOVUE-370 IOPAMIDOL (ISOVUE-370) INJECTION 76% COMPARISON:  Chest radiograph dated 03/07/2018 FINDINGS: Cardiovascular: Mild cardiomegaly. No pericardial effusion. The thoracic aorta is unremarkable. The origins of the great vessels of the aortic arch are patent. Evaluation of the pulmonary arteries is limited due to respiratory motion artifact and suboptimal opacification of the peripheral branches. No large or central pulmonary artery embolus identified. Mediastinum/Nodes: There is no hilar or mediastinal adenopathy. Esophagus is grossly unremarkable. Subcentimeter bilateral thyroid hypodense nodules. Ultrasound may provide better evaluation. Lungs/Pleura: Patchy bibasilar streaky densities and areas of consolidative changes noted which may represent atelectasis/scarring or pneumonia. Clinical correlation is recommended. There is no pleural effusion or pneumothorax. The central airways are  patent. Upper Abdomen: Probable fatty liver. Musculoskeletal: No chest wall abnormality. No acute or significant osseous findings. Review of the MIP images confirms the above findings. IMPRESSION: 1. No CT evidence of central pulmonary artery embolus. 2. Mild cardiomegaly. 3. Bibasilar atelectasis/scarring versus infiltrate. Clinical correlation is recommended. Electronically Signed   By: AAnner CreteM.D.   On: 03/08/2018 06:06   Dg Chest Port 1 View  Result Date: 03/10/2018 CLINICAL DATA:  Short of breath EXAM: PORTABLE CHEST 1 VIEW COMPARISON:  03/07/2018 FINDINGS: Cardiac enlargement without heart failure or edema. No effusion. Lungs now clear. IMPRESSION: No active disease. Electronically Signed   By: CFranchot GalloM.D.   On: 03/10/2018 07:20   ECHOCARDIOGRAM ------------------------------------------------------------------- Study Conclusions  - Left ventricle: The cavity size was normal. Wall thickness was increased in a pattern of mild LVH. Systolic function was normal. The estimated ejection fraction was in the range of 60% to 65%. Wall motion was normal; there were no regional wall motion abnormalities. Doppler parameters are consistent with  abnormal left ventricular relaxation (grade 1 diastolic dysfunction).  Impressions:  - Normal LV systolic function; mild diastolic dysfunction; mild LVH.  Subjective: Seen and examined at bedside and stated breathing and cough were significantly better than when coming into the hospital. No CP or SOB. No other concerns or complaints at this time and ready to go home.  Discharge Exam: Vitals:   03/11/18 0754 03/11/18 1431  BP:  126/78  Pulse:  (!) 104  Resp:    Temp:  97.7 F (36.5 C)  SpO2: 98% 97%   Vitals:   03/10/18 2030 03/11/18 0528 03/11/18 0754 03/11/18 1431  BP: 127/90 (!) 123/92  126/78  Pulse: (!) 116 89  (!) 104  Resp: (!) 22 (!) 24    Temp: 98.4 F (36.9 C) 98.3 F (36.8 C)  97.7 F (36.5 C)   TempSrc: Oral Oral  Oral  SpO2: 97% 99% 98% 97%  Weight:      Height:       General: Pt is an obese AAF who is alert, awake, not in acute distress Cardiovascular: RRR, S1/S2 +, no rubs, no gallops Respiratory: Diminished bilaterally, very so slight expiratory wheezing, no rhonchi; Unlabored breathing and cough is more productive Abdominal: Soft, NT, Distended due to body habitus. bowel sounds + Extremities: no edema, no cyanosis  The results of significant diagnostics from this hospitalization (including imaging, microbiology, ancillary and laboratory) are listed below for reference.    Microbiology: Recent Results (from the past 240 hour(s))  Respiratory Panel by PCR     Status: None   Collection Time: 03/07/18  8:36 PM  Result Value Ref Range Status   Adenovirus NOT DETECTED NOT DETECTED Final   Coronavirus 229E NOT DETECTED NOT DETECTED Final   Coronavirus HKU1 NOT DETECTED NOT DETECTED Final   Coronavirus NL63 NOT DETECTED NOT DETECTED Final   Coronavirus OC43 NOT DETECTED NOT DETECTED Final   Metapneumovirus NOT DETECTED NOT DETECTED Final   Rhinovirus / Enterovirus NOT DETECTED NOT DETECTED Final   Influenza A NOT DETECTED NOT DETECTED Final   Influenza B NOT DETECTED NOT DETECTED Final   Parainfluenza Virus 1 NOT DETECTED NOT DETECTED Final   Parainfluenza Virus 2 NOT DETECTED NOT DETECTED Final   Parainfluenza Virus 3 NOT DETECTED NOT DETECTED Final   Parainfluenza Virus 4 NOT DETECTED NOT DETECTED Final   Respiratory Syncytial Virus NOT DETECTED NOT DETECTED Final   Bordetella pertussis NOT DETECTED NOT DETECTED Final   Chlamydophila pneumoniae NOT DETECTED NOT DETECTED Final   Mycoplasma pneumoniae NOT DETECTED NOT DETECTED Final    Comment: Performed at [facility], [address] [place], [place] [address]  Culture, blood (routine x 2)     Status: None   Collection Time: 03/09/18  5:47 PM  Result Value Ref Range Status   Specimen Description   Final     BLOOD RIGHT ANTECUBITAL Performed at [facility] [address], [place], [place] [address]    Special Requests   Final    BOTTLES DRAWN AEROBIC ONLY Blood Culture adequate volume Performed at [facility] [address], [place], [place] [address]    Culture   Final    NO GROWTH 5 DAYS Performed at [facility], [place] [address] [place], [place] [address]    Report Status 03/14/2018 FINAL  Final  Culture, blood (routine x 2)     Status: None   Collection Time: 03/09/18  6:06 PM  Result Value  Ref Range Status   Specimen Description   Final    BLOOD RIGHT HAND Performed at [facility] [address] [place], [place] [address]    Special Requests   Final    BOTTLES DRAWN AEROBIC ONLY Blood Culture adequate volume Performed at [facility] [address], [place], [place] [address]    Culture   Final    NO GROWTH 5 DAYS Performed at [facility], [place] [address], [place],  [address]    Report Status 03/14/2018 FINAL  Final     Labs: BNP (last 3 results) Recent Labs    03/07/18 2106  BNP 78.9   Basic Metabolic Panel: Recent Labs  Lab 03/09/18 0816 03/10/18 0549 03/11/18 0537  NA 139 139 139  K 4.1 3.8 3.8  CL 106 104 104  CO2 '24 27 26  '$ GLUCOSE 130* 127* 120*  BUN 15 19 22*  CREATININE 0.73 0.77 0.71  CALCIUM 9.2 9.1 9.2  MG 2.3 2.2 2.3  PHOS 2.8 3.5 4.1   Liver Function Tests: Recent Labs  Lab 03/09/18 0816 03/10/18 0549 03/11/18 0537  AST '26 23 25  '$ ALT '26 28 30  '$ ALKPHOS 102 96 102  BILITOT 0.5 0.6 0.5  PROT 7.4 7.4 7.9  ALBUMIN 4.2 4.1 4.3   No results for input(s): LIPASE, AMYLASE in the last 168 hours. No results for input(s): AMMONIA in the last 168 hours. CBC: Recent Labs  Lab 03/09/18 0816 03/10/18 0549 03/11/18 0537  WBC 21.4* 14.6* 13.6*  NEUTROABS 17.1* 9.7* 8.3*  HGB 11.3* 11.5* 12.0  HCT 36.2 36.8 37.5  MCV 85.6 84.6 83.7  PLT 345  342 351   Cardiac Enzymes: No results for input(s): CKTOTAL, CKMB, CKMBINDEX, TROPONINI in the last 168 hours. BNP: Invalid input(s): POCBNP CBG: Recent Labs  Lab 03/10/18 1822 03/10/18 2134 03/11/18 0729 03/11/18 1123 03/11/18 1259  GLUCAP 223* 181* 120* 167* 192*   D-Dimer No results for input(s): DDIMER in the last 72 hours. Hgb A1c No results for input(s): HGBA1C in the last 72 hours. Lipid Profile No results for input(s): CHOL, HDL, LDLCALC, TRIG, CHOLHDL, LDLDIRECT in the last 72 hours. Thyroid function studies No results for input(s): TSH, T4TOTAL, T3FREE, THYROIDAB in the last 72 hours.  Invalid input(s): FREET3 Anemia work up No results for input(s): VITAMINB12, FOLATE, FERRITIN, TIBC, IRON, RETICCTPCT in the last 72 hours. Urinalysis    Component Value Date/Time   COLORURINE YELLOW 03/08/2018 0207   APPEARANCEUR CLEAR 03/08/2018 0207   LABSPEC 1.016 03/08/2018 0207   PHURINE 5.0 03/08/2018 0207   GLUCOSEU 50 (A) 03/08/2018 0207   HGBUR MODERATE (A) 03/08/2018 0207   BILIRUBINUR NEGATIVE 03/08/2018 0207   KETONESUR 20 (A) 03/08/2018 0207   PROTEINUR NEGATIVE 03/08/2018 0207   UROBILINOGEN 0.2 04/14/2015 0300   NITRITE POSITIVE (A) 03/08/2018 0207   LEUKOCYTESUR NEGATIVE 03/08/2018 0207   Sepsis Labs Invalid input(s): PROCALCITONIN,  WBC,  LACTICIDVEN Microbiology Recent Results (from the past 240 hour(s))  Respiratory Panel by PCR     Status: None   Collection Time: 03/07/18  8:36 PM  Result Value Ref Range Status   Adenovirus NOT DETECTED NOT DETECTED Final   Coronavirus 229E NOT DETECTED NOT DETECTED Final   Coronavirus HKU1 NOT DETECTED NOT DETECTED Final   Coronavirus NL63 NOT DETECTED NOT DETECTED Final   Coronavirus OC43 NOT DETECTED NOT DETECTED Final   Metapneumovirus NOT DETECTED NOT DETECTED Final   Rhinovirus / Enterovirus NOT DETECTED  NOT DETECTED Final   Influenza A NOT DETECTED NOT DETECTED Final   Influenza B NOT DETECTED NOT DETECTED  Final   Parainfluenza Virus 1 NOT DETECTED NOT DETECTED Final   Parainfluenza Virus 2 NOT DETECTED NOT DETECTED Final   Parainfluenza Virus 3 NOT DETECTED NOT DETECTED Final   Parainfluenza Virus 4 NOT DETECTED NOT DETECTED Final   Respiratory Syncytial Virus NOT DETECTED NOT DETECTED Final   Bordetella pertussis NOT DETECTED NOT DETECTED Final   Chlamydophila pneumoniae NOT DETECTED NOT DETECTED Final   Mycoplasma pneumoniae NOT DETECTED NOT DETECTED Final    Comment: Performed at [facility], [place] [address] [place], [place] [address]  Culture, blood (routine x 2)     Status: None   Collection Time: 03/09/18  5:47 PM  Result Value Ref Range Status   Specimen Description   Final    BLOOD RIGHT ANTECUBITAL Performed at [facility] [address] [place], [place] [address]    Special Requests   Final    BOTTLES DRAWN AEROBIC ONLY Blood Culture adequate volume Performed at [facility] [address], [place], [place] [address]    Culture   Final    NO GROWTH 5 DAYS Performed at [facility], [place] [address], [place], [place] [address]    Report Status 03/14/2018 FINAL  Final  Culture, blood (routine x 2)     Status: None   Collection Time: 03/09/18  6:06 PM  Result Value Ref Range Status   Specimen Description   Final    BLOOD RIGHT HAND Performed at [facility] [address] [place], [place] [address]    Special Requests   Final    BOTTLES DRAWN AEROBIC ONLY Blood Culture adequate volume Performed at [facility] [address], [place], [place] [address]    Culture   Final    NO GROWTH 5 DAYS Performed at [facility], [place] [address] [place],  [address]    Report Status 03/14/2018 FINAL  Final   Time coordinating discharge: 35 minutes  SIGNED:  Kerney Elbe, DO [facility] 03/16/2018, 12:04 AM Pager [phone]  If 7PM-7AM, please contact  night-coverage [email or web address] Password [email or web address]

## 2018-03-11 NOTE — Progress Notes (Signed)
O2 sats 95%-97% while ambulating on room air.

## 2018-03-11 NOTE — Progress Notes (Signed)
Nebulizer machine delivered to patient's room.  Letter for work given to patient.

## 2018-03-11 NOTE — Progress Notes (Signed)
Discharge instructions and medications discussed with patient.  Prescription and AVS given to patient. All questions answered. Patient requesting letter for work & nebulizer machine.  MD notified via text page.

## 2018-03-14 LAB — CULTURE, BLOOD (ROUTINE X 2)
CULTURE: NO GROWTH
Culture: NO GROWTH
Special Requests: ADEQUATE
Special Requests: ADEQUATE

## 2018-05-21 ENCOUNTER — Encounter (HOSPITAL_COMMUNITY): Payer: Self-pay | Admitting: Emergency Medicine

## 2018-05-21 ENCOUNTER — Emergency Department (HOSPITAL_COMMUNITY)
Admission: EM | Admit: 2018-05-21 | Discharge: 2018-05-22 | Disposition: A | Discharge status: Home / Self Care | Payer: BLUE CROSS/BLUE SHIELD | Attending: Emergency Medicine | Admitting: Emergency Medicine

## 2018-05-21 DIAGNOSIS — Z79899 Other long term (current) drug therapy: Secondary | ICD-10-CM | POA: Diagnosis not present

## 2018-05-21 DIAGNOSIS — M545 Low back pain, unspecified: Secondary | ICD-10-CM

## 2018-05-21 DIAGNOSIS — Z8673 Personal history of transient ischemic attack (TIA), and cerebral infarction without residual deficits: Secondary | ICD-10-CM | POA: Diagnosis not present

## 2018-05-21 DIAGNOSIS — R339 Retention of urine, unspecified: Secondary | ICD-10-CM | POA: Diagnosis not present

## 2018-05-21 LAB — CBC WITH DIFFERENTIAL/PLATELET
Basophils Absolute: 0 10*3/uL (ref 0.0–0.1)
Basophils Relative: 0 %
Eosinophils Absolute: 0.1 10*3/uL (ref 0.0–0.7)
Eosinophils Relative: 1 %
HEMATOCRIT: 36.3 % (ref 36.0–46.0)
Hemoglobin: 11.7 g/dL — ABNORMAL LOW (ref 12.0–15.0)
LYMPHS ABS: 2.8 10*3/uL (ref 0.7–4.0)
LYMPHS PCT: 31 %
MCH: 26.9 pg (ref 26.0–34.0)
MCHC: 32.2 g/dL (ref 30.0–36.0)
MCV: 83.4 fL (ref 78.0–100.0)
MONO ABS: 0.5 10*3/uL (ref 0.1–1.0)
Monocytes Relative: 6 %
NEUTROS ABS: 5.6 10*3/uL (ref 1.7–7.7)
Neutrophils Relative %: 62 %
Platelets: 363 10*3/uL (ref 150–400)
RBC: 4.35 MIL/uL (ref 3.87–5.11)
RDW: 13.7 % (ref 11.5–15.5)
WBC: 9.1 10*3/uL (ref 4.0–10.5)

## 2018-05-21 LAB — BASIC METABOLIC PANEL
ANION GAP: 13 (ref 5–15)
BUN: 17 mg/dL (ref 6–20)
CHLORIDE: 101 mmol/L (ref 101–111)
CO2: 24 mmol/L (ref 22–32)
Calcium: 9.4 mg/dL (ref 8.9–10.3)
Creatinine, Ser: 0.8 mg/dL (ref 0.44–1.00)
GFR calc Af Amer: >60 mL/min (ref >60)
GFR calc non Af Amer: >60 mL/min (ref >60)
GLUCOSE: 106 mg/dL — AB (ref 65–99)
POTASSIUM: 3.7 mmol/L (ref 3.5–5.1)
Sodium: 138 mmol/L (ref 135–145)

## 2018-05-21 MED ORDER — HYDROCODONE-ACETAMINOPHEN 5-325 MG PO TABS
1.0000 | ORAL_TABLET | Freq: Once | ORAL | Status: AC
  Administered 2018-05-22: 1 via ORAL
  Filled 2018-05-21: qty 1

## 2018-05-21 MED ORDER — ONDANSETRON 8 MG PO TBDP
8.0000 mg | ORAL_TABLET | Freq: Once | ORAL | Status: AC
  Administered 2018-05-21: 8 mg via ORAL
  Filled 2018-05-21: qty 1

## 2018-05-21 MED ORDER — DIAZEPAM 5 MG PO TABS
5.0000 mg | ORAL_TABLET | Freq: Once | ORAL | Status: AC
  Administered 2018-05-21: 5 mg via ORAL
  Filled 2018-05-21: qty 1

## 2018-05-21 MED ORDER — HYDROMORPHONE HCL 2 MG/ML IJ SOLN
2.0000 mg | Freq: Once | INTRAMUSCULAR | Status: AC
  Administered 2018-05-21: 2 mg via INTRAMUSCULAR
  Filled 2018-05-21: qty 1

## 2018-05-21 NOTE — ED Notes (Signed)
Bladder scanned with volume  Noted.

## 2018-05-21 NOTE — ED Notes (Signed)
Attempted to get urine sample, pt stated that she felt as if she were peeing but couldn't.

## 2018-05-21 NOTE — ED Triage Notes (Signed)
Patient c/o left lower back pain radiating down left leg x months. Reports increased falls x2 days due to pain. Denies head injury and LOC. Denies neck pain. Hx Lupus.

## 2018-05-21 NOTE — Discharge Instructions (Addendum)
Your MRIs in the emergency department were negative.  They did not reveal any concerning or emergent cause of your pain.  We suspect that your pain is due to inflammation and muscle spasm.  Take prednisone as prescribed until finished.  You may supplement this with Robaxin for muscle spasms and Norco for pain control.  Do not drive or drink alcohol after taking Robaxin or Norco as it may make you drowsy and impair your judgment.  Follow-up with your primary care doctor for symptom recheck.  You would also benefit from follow-up with neurosurgery if your pain persists.

## 2018-05-22 ENCOUNTER — Emergency Department (HOSPITAL_COMMUNITY): Payer: BLUE CROSS/BLUE SHIELD

## 2018-05-22 MED ORDER — PREDNISONE 20 MG PO TABS: 40.0000 mg | ORAL_TABLET | Freq: Every day | ORAL | 0 refills | Status: DC

## 2018-05-22 MED ORDER — HYDROCODONE-ACETAMINOPHEN 5-325 MG PO TABS: 1.0000 | ORAL_TABLET | ORAL | 0 refills | Status: DC | PRN

## 2018-05-22 MED ORDER — HYDROCODONE-ACETAMINOPHEN 5-325 MG PO TABS
1.0000 | ORAL_TABLET | Freq: Once | ORAL | Status: AC
  Administered 2018-05-22: 1 via ORAL
  Filled 2018-05-22: qty 1

## 2018-05-22 MED ORDER — PROMETHAZINE HCL 25 MG/ML IJ SOLN
25.0000 mg | Freq: Once | INTRAMUSCULAR | Status: AC
  Administered 2018-05-22: 25 mg via INTRAMUSCULAR
  Filled 2018-05-22: qty 1

## 2018-05-22 MED ORDER — METHOCARBAMOL 500 MG PO TABS: 500.0000 mg | ORAL_TABLET | Freq: Three times a day (TID) | ORAL | 0 refills | Status: DC | PRN

## 2018-05-22 MED ORDER — LIDOCAINE 5 % EX PTCH
1.0000 | MEDICATED_PATCH | CUTANEOUS | Status: DC
  Administered 2018-05-22: 1 via TRANSDERMAL
  Filled 2018-05-22: qty 1

## 2018-05-22 MED ORDER — DEXAMETHASONE SODIUM PHOSPHATE 10 MG/ML IJ SOLN
10.0000 mg | Freq: Once | INTRAMUSCULAR | Status: AC
  Administered 2018-05-22: 10 mg via INTRAMUSCULAR
  Filled 2018-05-22: qty 1

## 2018-05-22 NOTE — ED Notes (Signed)
Pt ambulated minimally to attempt to go to the bathroom, required a wheelchair to go to the bathroom. Pt refused alternative options that allowed her not to have to walk.

## 2018-05-22 NOTE — ED Provider Notes (Signed)
3:45 AM Patient care assumed from Dr. Rhunette CroftNanavati following [facility].  Patient with severe worsening lower back pain.  She was found to have a bladder scan of approximately 340 cc prior to arrival.  No lower extremity numbness or genital paresthesias.  Patient currently an MRI to evaluate thoracic and lumbar spine.  We will continue to follow.  4:45 AM MRI results as follows:  IMPRESSION: MR THORACIC SPINE IMPRESSION  Normal MRI of the thoracic spine. No acute abnormality identified. No significant disc pathology or stenosis.  MR LUMBAR SPINE IMPRESSION  1. No acute abnormality within the lumbar spine. 2. Minimal facet hypertrophy at L4-5 and L5-S1. 3. Otherwise normal MRI of the lumbar spine. No significant disc pathology. No stenosis or neural impingement.  Pain likely 2/2 MSK etiology. Will discuss imaging results with patient.  5:08 AM Discussed MRI results with patient.  She verbalizes understanding.  She is mostly complaining of persistent pain and nausea at this time.  She states the Dilaudid made her nauseous because she did not have anything on her stomach.  Will give IM Phenergan as well as IM Decadron for management of inflammation.  Discussed plan for home pain control with prednisone taper, muscle relaxers, short course of pain medicine.   Vitals:   05/21/18 2243 05/22/18 0009 05/22/18 0615 05/22/18 0637  BP: (!) 163/92 134/81 117/78 114/75  Pulse: 91 98 74 79  Resp: 18 18  18   Temp:      TempSrc:      SpO2: 97% 98% 98% 98%  Weight:      Height:          Antony MaduraHumes, Jeremie Abdelaziz, PA-C 05/22/18 0640    Zadie RhineWickline, Donald, MD 05/22/18 [phone]

## 2018-05-22 NOTE — ED Notes (Signed)
Pt in MRI.
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
## 2018-09-22 ENCOUNTER — Other Ambulatory Visit: Payer: Self-pay

## 2018-09-22 ENCOUNTER — Emergency Department (HOSPITAL_COMMUNITY): Payer: BLUE CROSS/BLUE SHIELD

## 2018-09-22 ENCOUNTER — Encounter (HOSPITAL_COMMUNITY): Payer: Self-pay | Admitting: Emergency Medicine

## 2018-09-22 ENCOUNTER — Emergency Department (HOSPITAL_COMMUNITY)
Admission: EM | Admit: 2018-09-22 | Discharge: 2018-09-23 | Disposition: A | Discharge status: Home / Self Care | Payer: BLUE CROSS/BLUE SHIELD | Attending: Emergency Medicine | Admitting: Emergency Medicine

## 2018-09-22 DIAGNOSIS — R5383 Other fatigue: Secondary | ICD-10-CM | POA: Diagnosis not present

## 2018-09-22 DIAGNOSIS — Z7982 Long term (current) use of aspirin: Secondary | ICD-10-CM | POA: Diagnosis not present

## 2018-09-22 DIAGNOSIS — R21 Rash and other nonspecific skin eruption: Secondary | ICD-10-CM | POA: Diagnosis not present

## 2018-09-22 DIAGNOSIS — R1013 Epigastric pain: Secondary | ICD-10-CM | POA: Insufficient documentation

## 2018-09-22 DIAGNOSIS — M791 Myalgia, unspecified site: Secondary | ICD-10-CM | POA: Insufficient documentation

## 2018-09-22 DIAGNOSIS — R0789 Other chest pain: Secondary | ICD-10-CM | POA: Diagnosis not present

## 2018-09-22 DIAGNOSIS — R0601 Orthopnea: Secondary | ICD-10-CM | POA: Insufficient documentation

## 2018-09-22 DIAGNOSIS — R0602 Shortness of breath: Secondary | ICD-10-CM | POA: Insufficient documentation

## 2018-09-22 DIAGNOSIS — Z9114 Patient's other noncompliance with medication regimen: Secondary | ICD-10-CM | POA: Diagnosis not present

## 2018-09-22 DIAGNOSIS — R112 Nausea with vomiting, unspecified: Secondary | ICD-10-CM | POA: Insufficient documentation

## 2018-09-22 DIAGNOSIS — Z79899 Other long term (current) drug therapy: Secondary | ICD-10-CM | POA: Insufficient documentation

## 2018-09-22 LAB — COMPREHENSIVE METABOLIC PANEL
ALT: 26 U/L (ref 0–44)
AST: 21 U/L (ref 15–41)
Albumin: 4.6 g/dL (ref 3.5–5.0)
Alkaline Phosphatase: 114 U/L (ref 38–126)
Anion gap: 11 (ref 5–15)
BILIRUBIN TOTAL: 0.2 mg/dL — AB (ref 0.3–1.2)
BUN: 16 mg/dL (ref 6–20)
CALCIUM: 9.6 mg/dL (ref 8.9–10.3)
CO2: 27 mmol/L (ref 22–32)
Chloride: 104 mmol/L (ref 98–111)
Creatinine, Ser: 0.76 mg/dL (ref 0.44–1.00)
GFR calc Af Amer: >60 mL/min (ref >60)
Glucose, Bld: 104 mg/dL — ABNORMAL HIGH (ref 70–99)
Potassium: 4 mmol/L (ref 3.5–5.1)
Sodium: 142 mmol/L (ref 135–145)
TOTAL PROTEIN: 8.6 g/dL — AB (ref 6.5–8.1)

## 2018-09-22 LAB — LIPASE, BLOOD: Lipase: 30 U/L (ref 11–51)

## 2018-09-22 LAB — CBC
HCT: 39.8 % (ref 36.0–46.0)
HEMOGLOBIN: 12 g/dL (ref 12.0–15.0)
MCH: 25.8 pg — ABNORMAL LOW (ref 26.0–34.0)
MCHC: 30.2 g/dL (ref 30.0–36.0)
MCV: 85.4 fL (ref 80.0–100.0)
NRBC: 0 % (ref 0.0–0.2)
PLATELETS: 414 10*3/uL — AB (ref 150–400)
RBC: 4.66 MIL/uL (ref 3.87–5.11)
RDW: 13.1 % (ref 11.5–15.5)
WBC: 8.2 10*3/uL (ref 4.0–10.5)

## 2018-09-22 LAB — I-STAT BETA HCG BLOOD, ED (MC, WL, AP ONLY)

## 2018-09-22 LAB — D-DIMER, QUANTITATIVE (NOT AT ARMC): D DIMER QUANT: 0.4 ug{FEU}/mL (ref 0.00–0.50)

## 2018-09-22 MED ORDER — HYDROCODONE-ACETAMINOPHEN 5-325 MG PO TABS: 1.0000 | ORAL_TABLET | Freq: Four times a day (QID) | ORAL | Status: DC | PRN

## 2018-09-22 MED ORDER — ONDANSETRON HCL 4 MG/2ML IJ SOLN
4.0000 mg | Freq: Once | INTRAMUSCULAR | Status: AC
  Administered 2018-09-22: 4 mg via INTRAVENOUS
  Filled 2018-09-22: qty 2

## 2018-09-22 MED ORDER — FENTANYL CITRATE (PF) 100 MCG/2ML IJ SOLN
50.0000 ug | Freq: Once | INTRAMUSCULAR | Status: AC
  Administered 2018-09-22: 50 ug via INTRAVENOUS
  Filled 2018-09-22: qty 2

## 2018-09-22 NOTE — ED Triage Notes (Signed)
Patient c/o generalized body aches with cough x2 week and upper abdominal pain and nausea x3 days. Denies V/D and fevers.
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
## 2018-09-23 ENCOUNTER — Emergency Department (HOSPITAL_COMMUNITY): Payer: BLUE CROSS/BLUE SHIELD

## 2018-09-23 MED ORDER — PREDNISONE 10 MG PO TABS: ORAL_TABLET | ORAL | 0 refills | Status: DC

## 2018-09-23 MED ORDER — ONDANSETRON 4 MG PO TBDP: 4.0000 mg | ORAL_TABLET | Freq: Three times a day (TID) | ORAL | 0 refills | Status: DC | PRN

## 2018-09-23 MED ORDER — OMEPRAZOLE 20 MG PO CPDR: 20.0000 mg | DELAYED_RELEASE_CAPSULE | Freq: Every day | ORAL | 0 refills | Status: DC

## 2018-09-23 NOTE — ED Notes (Signed)
PT INFORMED UA IS NEEDED! 

## 2018-09-23 NOTE — ED Provider Notes (Signed)
Assumed care from Resident and Dr. Madilyn Hook at shift change.  See prior notes for full H&P.  Briefly, 47 y.o. F here with generalized body aches, cough, and epigastric abdominal pain for the past week.  Work-up thus far including d-dimer has been negative.  She does have history of lupus, not currently on any immune suppressants or steroids.    Plan:  CT A/P pending.  If negative, home on her steroids for possible flare-up, anti-emetics, PPI.  Results for orders placed or performed during the hospital encounter of 09/22/18  Lipase, blood  Result Value Ref Range   Lipase 30 11 - 51 U/L  Comprehensive metabolic panel  Result Value Ref Range   Sodium 142 135 - 145 mmol/L   Potassium 4.0 3.5 - 5.1 mmol/L   Chloride 104 98 - 111 mmol/L   CO2 27 22 - 32 mmol/L   Glucose, Bld 104 (H) 70 - 99 mg/dL   BUN 16 6 - 20 mg/dL   Creatinine, Ser 4.54 0.44 - 1.00 mg/dL   Calcium 9.6 8.9 - 09.8 mg/dL   Total Protein 8.6 (H) 6.5 - 8.1 g/dL   Albumin 4.6 3.5 - 5.0 g/dL   AST 21 15 - 41 U/L   ALT 26 0 - 44 U/L   Alkaline Phosphatase 114 38 - 126 U/L   Total Bilirubin 0.2 (L) 0.3 - 1.2 mg/dL   GFR calc non Af Amer >60 >60 mL/min   GFR calc Af Amer >60 >60 mL/min   Anion gap 11 5 - 15  CBC  Result Value Ref Range   WBC 8.2 4.0 - 10.5 K/uL   RBC 4.66 3.87 - 5.11 MIL/uL   Hemoglobin 12.0 12.0 - 15.0 g/dL   HCT 11.9 14.7 - 82.9 %   MCV 85.4 80.0 - 100.0 fL   MCH 25.8 (L) 26.0 - 34.0 pg   MCHC 30.2 30.0 - 36.0 g/dL   RDW 56.2 13.0 - 86.5 %   Platelets 414 (H) 150 - 400 K/uL   nRBC 0.0 0.0 - 0.2 %  D-dimer, quantitative (not at [facility])  Result Value Ref Range   D-Dimer, Quant 0.40 0.00 - 0.50 ug/mL-FEU  I-Stat beta hCG blood, ED  Result Value Ref Range   I-stat hCG, quantitative <5.0 <5 mIU/mL   Comment 3           Ct Abdomen Pelvis Wo Contrast  Result Date: 09/23/2018 CLINICAL DATA:  Upper abdominal pain and nausea for 3 days. History of lupus, abdominal tumor removed, endometriosis, contrast  allergy. EXAM: CT ABDOMEN AND PELVIS WITHOUT CONTRAST TECHNIQUE: Multidetector CT imaging of the abdomen and pelvis was performed following the standard protocol without IV contrast. COMPARISON:  03/07/2018 FINDINGS: Lower chest: Mild dependent changes in the lung bases. Hepatobiliary: No focal liver abnormality is seen. No gallstones, gallbladder wall thickening, or biliary dilatation. Pancreas: Unremarkable. No pancreatic ductal dilatation or surrounding inflammatory changes. Spleen: Normal in size without focal abnormality. Adrenals/Urinary Tract: No adrenal gland nodules. Cyst in the left kidney midpole unchanged. No hydronephrosis or hydroureter. No renal or ureteral stones. No bladder stone or bladder wall thickening. Stomach/Bowel: Stomach is within normal limits. Appendix appears normal. No evidence of bowel wall thickening, distention, or inflammatory changes. Vascular/Lymphatic: No significant vascular findings are present. No enlarged abdominal or pelvic lymph nodes. Reproductive: Status post hysterectomy. No adnexal masses. Other: Scarring in the anterior abdominal wall consistent with postoperative change. No free air or free fluid in the abdomen. Musculoskeletal: No acute or  significant osseous findings. IMPRESSION: No acute process demonstrated in the abdomen or pelvis. No evidence of bowel obstruction or inflammation. Normal appendix. No renal or ureteral stones or obstruction. Stable left renal cyst. Electronically Signed   By: Burman Nieves M.D.   On: 09/23/2018 02:35   Dg Chest Portable 1 View  Result Date: 09/23/2018 CLINICAL DATA:  Chest pain SOB, nausea vomiting fever x 2 weeks EXAM: PORTABLE CHEST 1 VIEW COMPARISON:  04/12/2018 FINDINGS: Mild enlargement of the cardiopericardial silhouette. Low lung volumes. No edema. The lungs appear clear. No pleural effusion identified. IMPRESSION: 1. Mild enlargement of the cardiopericardial silhouette, without edema. Electronically Signed   By:  Gaylyn Rong M.D.   On: 09/23/2018 00:08   CT negative for acute findings.  Will d/c home with prednisone taper, prilosec and zofran.  She will follow-up with her PCP.  Return here for any new/acute changes.   Garlon Hatchet, PA-C 09/23/18 0432    Paula Libra, MD 09/23/18 [phone]

## 2018-09-23 NOTE — Discharge Instructions (Signed)
Take the prescribed medication as directed. °Follow-up with your primary care doctor. °Return to the ED for new or worsening symptoms. °

## 2018-11-20 ENCOUNTER — Emergency Department (HOSPITAL_COMMUNITY): Payer: BLUE CROSS/BLUE SHIELD

## 2018-11-20 ENCOUNTER — Encounter (HOSPITAL_COMMUNITY): Payer: Self-pay | Admitting: Emergency Medicine

## 2018-11-20 ENCOUNTER — Emergency Department (HOSPITAL_COMMUNITY)
Admission: EM | Admit: 2018-11-20 | Discharge: 2018-11-20 | Disposition: A | Discharge status: Home / Self Care | Payer: BLUE CROSS/BLUE SHIELD | Attending: Emergency Medicine | Admitting: Emergency Medicine

## 2018-11-20 DIAGNOSIS — M25561 Pain in right knee: Secondary | ICD-10-CM | POA: Insufficient documentation

## 2018-11-20 DIAGNOSIS — M542 Cervicalgia: Secondary | ICD-10-CM | POA: Diagnosis not present

## 2018-11-20 DIAGNOSIS — R51 Headache: Secondary | ICD-10-CM | POA: Insufficient documentation

## 2018-11-20 DIAGNOSIS — W01198A Fall on same level from slipping, tripping and stumbling with subsequent striking against other object, initial encounter: Secondary | ICD-10-CM | POA: Diagnosis not present

## 2018-11-20 DIAGNOSIS — W19XXXA Unspecified fall, initial encounter: Secondary | ICD-10-CM

## 2018-11-20 DIAGNOSIS — M25511 Pain in right shoulder: Secondary | ICD-10-CM | POA: Diagnosis not present

## 2018-11-20 DIAGNOSIS — Y939 Activity, unspecified: Secondary | ICD-10-CM | POA: Diagnosis not present

## 2018-11-20 DIAGNOSIS — Z79899 Other long term (current) drug therapy: Secondary | ICD-10-CM | POA: Diagnosis not present

## 2018-11-20 DIAGNOSIS — Y999 Unspecified external cause status: Secondary | ICD-10-CM | POA: Diagnosis not present

## 2018-11-20 DIAGNOSIS — Y9222 Religious institution as the place of occurrence of the external cause: Secondary | ICD-10-CM | POA: Diagnosis not present

## 2018-11-20 MED ORDER — KETAMINE HCL 50 MG/5ML IJ SOSY: 0.3000 mg/kg | PREFILLED_SYRINGE | Freq: Once | INTRAMUSCULAR | Status: DC

## 2018-11-20 MED ORDER — PROMETHAZINE HCL 25 MG/ML IJ SOLN: 25.0000 mg | Freq: Once | INTRAMUSCULAR | Status: DC

## 2018-11-20 MED ORDER — HYDROCODONE-ACETAMINOPHEN 5-325 MG PO TABS
2.0000 | ORAL_TABLET | ORAL | Status: DC | PRN
  Administered 2018-11-20: 2 via ORAL
  Filled 2018-11-20: qty 2

## 2018-11-20 MED ORDER — FENTANYL CITRATE (PF) 100 MCG/2ML IJ SOLN
50.0000 ug | Freq: Once | INTRAMUSCULAR | Status: AC
  Administered 2018-11-20: 50 ug via INTRAVENOUS
  Filled 2018-11-20: qty 2

## 2018-11-20 MED ORDER — HYDROCODONE-ACETAMINOPHEN 5-325 MG PO TABS: 1.0000 | ORAL_TABLET | ORAL | Status: DC | PRN

## 2018-11-20 NOTE — ED Notes (Signed)
Patient back from imaging and c/o pain worsening. MD aware.

## 2018-11-20 NOTE — ED Notes (Signed)
Patient left without notifying her primary RN , did not received her discharge instructions .

## 2018-11-20 NOTE — ED Notes (Signed)
When sitting up patient in bed to give ordered pain medication, she yelled and stated "I don't want the pills, I'll wait for the scans to come back." Patient placed back in comfortable position. MD aware.
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
## 2019-02-19 ENCOUNTER — Emergency Department (HOSPITAL_COMMUNITY): Payer: Self-pay

## 2019-02-19 ENCOUNTER — Other Ambulatory Visit: Payer: Self-pay

## 2019-02-19 ENCOUNTER — Emergency Department (HOSPITAL_COMMUNITY)
Admission: EM | Admit: 2019-02-19 | Discharge: 2019-02-20 | Disposition: A | Discharge status: Home / Self Care | Payer: Self-pay | Attending: Emergency Medicine | Admitting: Emergency Medicine

## 2019-02-19 DIAGNOSIS — R140 Abdominal distension (gaseous): Secondary | ICD-10-CM | POA: Insufficient documentation

## 2019-02-19 DIAGNOSIS — Z79899 Other long term (current) drug therapy: Secondary | ICD-10-CM | POA: Insufficient documentation

## 2019-02-19 DIAGNOSIS — R1084 Generalized abdominal pain: Secondary | ICD-10-CM | POA: Insufficient documentation

## 2019-02-19 DIAGNOSIS — K5900 Constipation, unspecified: Secondary | ICD-10-CM

## 2019-02-19 LAB — COMPREHENSIVE METABOLIC PANEL
ALK PHOS: 120 U/L (ref 38–126)
ALT: 26 U/L (ref 0–44)
AST: 25 U/L (ref 15–41)
Albumin: 5 g/dL (ref 3.5–5.0)
Anion gap: 9 (ref 5–15)
BUN: 20 mg/dL (ref 6–20)
CO2: 25 mmol/L (ref 22–32)
Calcium: 9.9 mg/dL (ref 8.9–10.3)
Chloride: 102 mmol/L (ref 98–111)
Creatinine, Ser: 0.69 mg/dL (ref 0.44–1.00)
GFR calc Af Amer: >60 mL/min (ref >60)
GFR calc non Af Amer: >60 mL/min (ref >60)
Glucose, Bld: 121 mg/dL — ABNORMAL HIGH (ref 70–99)
Potassium: 3.6 mmol/L (ref 3.5–5.1)
Sodium: 136 mmol/L (ref 135–145)
Total Bilirubin: 0.6 mg/dL (ref 0.3–1.2)
Total Protein: 8.4 g/dL — ABNORMAL HIGH (ref 6.5–8.1)

## 2019-02-19 LAB — CBC
HCT: 39.8 % (ref 36.0–46.0)
HEMOGLOBIN: 11.9 g/dL — AB (ref 12.0–15.0)
MCH: 25.6 pg — ABNORMAL LOW (ref 26.0–34.0)
MCHC: 29.9 g/dL — ABNORMAL LOW (ref 30.0–36.0)
MCV: 85.8 fL (ref 80.0–100.0)
Platelets: 329 10*3/uL (ref 150–400)
RBC: 4.64 MIL/uL (ref 3.87–5.11)
RDW: 13.3 % (ref 11.5–15.5)
WBC: 7.4 10*3/uL (ref 4.0–10.5)
nRBC: 0 % (ref 0.0–0.2)

## 2019-02-19 LAB — LIPASE, BLOOD: Lipase: 30 U/L (ref 11–51)

## 2019-02-19 MED ORDER — ONDANSETRON HCL 4 MG/2ML IJ SOLN
4.0000 mg | Freq: Once | INTRAMUSCULAR | Status: AC | PRN
  Administered 2019-02-19: 4 mg via INTRAVENOUS
  Filled 2019-02-19: qty 2

## 2019-02-19 MED ORDER — ONDANSETRON HCL 4 MG/2ML IJ SOLN
4.0000 mg | Freq: Once | INTRAMUSCULAR | Status: AC
  Administered 2019-02-20: 4 mg via INTRAVENOUS
  Filled 2019-02-19: qty 2

## 2019-02-19 MED ORDER — SODIUM CHLORIDE 0.9 % IV BOLUS
1000.0000 mL | Freq: Once | INTRAVENOUS | Status: AC
  Administered 2019-02-19: 1000 mL via INTRAVENOUS

## 2019-02-19 MED ORDER — HYDROMORPHONE HCL 1 MG/ML IJ SOLN
0.5000 mg | Freq: Once | INTRAMUSCULAR | Status: AC
  Administered 2019-02-19: 0.5 mg via INTRAVENOUS
  Filled 2019-02-19: qty 1

## 2019-02-19 MED ORDER — MORPHINE SULFATE (PF) 4 MG/ML IV SOLN
4.0000 mg | Freq: Once | INTRAVENOUS | Status: AC
  Administered 2019-02-19: 4 mg via INTRAVENOUS
  Filled 2019-02-19: qty 1

## 2019-02-19 MED ORDER — BARIUM SULFATE 2 % PO SUSP
450.0000 mL | Freq: Once | ORAL | Status: AC
  Administered 2019-02-20: 450 mL via ORAL

## 2019-02-19 MED ORDER — SODIUM CHLORIDE 0.9% FLUSH
3.0000 mL | Freq: Once | INTRAVENOUS | Status: AC
  Administered 2019-02-19: 3 mL via INTRAVENOUS

## 2019-02-19 NOTE — ED Provider Notes (Signed)
  Face-to-face evaluation   History: She complains of nausea, vomiting and diarrhea for several days.  Her diarrhea is improving but she still continues to be uncomfortable.  No similar problems in the past.  Physical exam: Alert, uncomfortable.  Abdomen is very distended, and tense to palpation.  The abdomen is diffusely tender.  Patient has a midline vertical scar, from prior surgery.  MDM-we will evaluate abdominal pain with CT abdomen pelvis.  Clinical concern for bowel obstruction.  Medical screening examination/treatment/procedure(s) were conducted as a shared visit with non-physician practitioner(s) and myself.  I personally evaluated the patient during the encounter    Mancel Bale, MD 02/20/19 1106

## 2019-02-19 NOTE — ED Triage Notes (Signed)
Pt reports N/V/D and abdominal pain x4 days. Pt reports she thought when her diarrhea decreased she would feel better but it has gotten worse.

## 2019-02-20 ENCOUNTER — Emergency Department (HOSPITAL_COMMUNITY): Payer: Self-pay

## 2019-02-20 LAB — URINALYSIS, ROUTINE W REFLEX MICROSCOPIC
Bilirubin Urine: NEGATIVE
GLUCOSE, UA: NEGATIVE mg/dL
Hgb urine dipstick: NEGATIVE
KETONES UR: 5 mg/dL — AB
Nitrite: NEGATIVE
Protein, ur: NEGATIVE mg/dL
Specific Gravity, Urine: 1.017 (ref 1.005–1.030)
pH: 5 (ref 5.0–8.0)

## 2019-02-20 MED ORDER — PANTOPRAZOLE SODIUM 40 MG IV SOLR
40.0000 mg | Freq: Once | INTRAVENOUS | Status: AC
  Administered 2019-02-20: 40 mg via INTRAVENOUS
  Filled 2019-02-20: qty 40

## 2019-02-20 MED ORDER — SUCRALFATE 1 G PO TABS: 1.0000 g | ORAL_TABLET | Freq: Three times a day (TID) | ORAL | 0 refills | Status: DC

## 2019-02-20 MED ORDER — DICYCLOMINE HCL 20 MG PO TABS: 20.0000 mg | ORAL_TABLET | Freq: Two times a day (BID) | ORAL | 0 refills | Status: DC | PRN

## 2019-02-20 MED ORDER — LIDOCAINE VISCOUS HCL 2 % MT SOLN
15.0000 mL | Freq: Once | OROMUCOSAL | Status: AC
  Administered 2019-02-20: 15 mL via ORAL
  Filled 2019-02-20: qty 15

## 2019-02-20 MED ORDER — POLYETHYLENE GLYCOL 3350 17 GM/SCOOP PO POWD: 1.0000 | Freq: Once | ORAL | 0 refills | Status: AC

## 2019-02-20 MED ORDER — FAMOTIDINE 20 MG PO TABS: 20.0000 mg | ORAL_TABLET | Freq: Two times a day (BID) | ORAL | 0 refills | Status: DC

## 2019-02-20 MED ORDER — ALUM & MAG HYDROXIDE-SIMETH 200-200-20 MG/5ML PO SUSP
30.0000 mL | Freq: Once | ORAL | Status: AC
  Administered 2019-02-20: 30 mL via ORAL
  Filled 2019-02-20: qty 30

## 2019-02-20 NOTE — ED Notes (Signed)
Pt started drinking PO contrast.
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
## 2019-02-20 NOTE — ED Provider Notes (Signed)
Received patient at signout from [facility].  Refer to provider note for full history and physical examination.  Briefly, patient is a 48 year old female with history of lupus, seizures, migraines, endometriosis, status post abdominal hysterectomy and excision of abdominal mass presenting with epigastric and periumbilical abdominal pains for approximately 1 week.  Initially associated with some loose stools and nausea with some vomiting.  The symptoms improved however the pain worsened significantly in the last 2 days.  Pending CT of the abdomen and pelvis to rule out obstruction. Physical Exam  BP 132/84   Pulse 87   Temp 98.3 F (36.8 C) (Oral)   Resp 18   Ht 5' (1.524 m)   Wt 75.8 kg   LMP 06/14/2003   SpO2 96%   BMI 32.61 kg/m   Physical Exam Vitals signs and nursing note reviewed.  Constitutional:      General: She is not in acute distress.    Appearance: She is well-developed.     Comments: Appears uncomfortable  HENT:     Head: Normocephalic and atraumatic.  Eyes:     General:        Right eye: No discharge.        Left eye: No discharge.     Conjunctiva/sclera: Conjunctivae normal.  Neck:     Vascular: No JVD.     Trachea: No tracheal deviation.  Cardiovascular:     Rate and Rhythm: Normal rate.  Pulmonary:     Effort: Pulmonary effort is normal.  Abdominal:     General: Abdomen is protuberant. A surgical scar is present. Bowel sounds are decreased. There is distension.     Tenderness: There is generalized abdominal tenderness. There is no guarding.  Skin:    Findings: No erythema.  Neurological:     Mental Status: She is alert.  Psychiatric:        Behavior: Behavior normal.       MDM  IMPRESSION: 1. 4.1 cm left interpolar simple renal cyst. 2. Moderate to large amount of stool retention throughout the colon consistent with constipation. No acute bowel obstruction or inflammation.   Informed patient of CT scan findings including incidental finding of renal  cyst.  Patient reports longstanding history of constipation but states this feels different in location.  I offered her a GI cocktail and Protonix which she was open to and on reevaluation reports improvement in her pain.  A suspect possible PUD/GERD component to her symptoms but she does have moderate to large stool retention throughout the colon.  I offered her an enema and manual disimpaction in the ED but she refused both.  Will discharge with bowel regimen.  No evidence of acute surgical abdominal pathology. We will also discharge with Pepcid and Carafate.  Recommend follow-up with PCP or gastroenterology for reevaluation of symptoms.  Discussed strict ED return precautions. Pt verbalized understanding of and agreement with plan and is safe for discharge home at this time.        Jeanie Sewer, PA-C 02/20/19 0312    Paula Libra, MD 02/20/19 [phone]

## 2019-02-20 NOTE — Discharge Instructions (Addendum)
1. Medications: Start taking Pepcid twice daily.  This is an acid reflux medicine.  You can also take Carafate with meals and at night.  This medicine coats the stomach and helps soothe irritation.  Take Bentyl as needed for abdominal cramping.  Start taking MiraLAX.  I recommend taking 7 capfuls in 32 ounces of liquid for 1 dose and then 1-2 capfuls daily with 8 ounces of water. 2. Treatment: rest, drink plenty of fluids, eat a high-fiber diet.  Stay active. 3. Follow Up: Please followup with your primary doctor or a gastroenterologist in 3 days for discussion of your diagnoses and further evaluation after today's visit; if you do not have a primary care doctor use the resource guide provided to find one; Please return to the ER for persistent vomiting, high fevers or worsening symptoms

## 2019-06-01 ENCOUNTER — Encounter: Payer: Self-pay | Admitting: *Deleted

## 2019-06-06 ENCOUNTER — Emergency Department (HOSPITAL_COMMUNITY)
Admission: EM | Admit: 2019-06-06 | Discharge: 2019-06-07 | Disposition: A | Discharge status: Home / Self Care | Payer: Self-pay | Attending: Emergency Medicine | Admitting: Emergency Medicine

## 2019-06-06 ENCOUNTER — Other Ambulatory Visit: Payer: Self-pay

## 2019-06-06 ENCOUNTER — Emergency Department (HOSPITAL_COMMUNITY): Payer: Self-pay

## 2019-06-06 ENCOUNTER — Encounter (HOSPITAL_COMMUNITY): Payer: Self-pay

## 2019-06-06 DIAGNOSIS — R1013 Epigastric pain: Secondary | ICD-10-CM | POA: Insufficient documentation

## 2019-06-06 DIAGNOSIS — R1011 Right upper quadrant pain: Secondary | ICD-10-CM | POA: Insufficient documentation

## 2019-06-06 DIAGNOSIS — Z79899 Other long term (current) drug therapy: Secondary | ICD-10-CM | POA: Insufficient documentation

## 2019-06-06 DIAGNOSIS — Z8673 Personal history of transient ischemic attack (TIA), and cerebral infarction without residual deficits: Secondary | ICD-10-CM | POA: Insufficient documentation

## 2019-06-06 LAB — COMPREHENSIVE METABOLIC PANEL
ALT: 26 U/L (ref 0–44)
AST: 23 U/L (ref 15–41)
Albumin: 4.9 g/dL (ref 3.5–5.0)
Alkaline Phosphatase: 112 U/L (ref 38–126)
Anion gap: 13 (ref 5–15)
BUN: 14 mg/dL (ref 6–20)
CO2: 24 mmol/L (ref 22–32)
Calcium: 10.1 mg/dL (ref 8.9–10.3)
Chloride: 102 mmol/L (ref 98–111)
Creatinine, Ser: 0.74 mg/dL (ref 0.44–1.00)
GFR calc Af Amer: >60 mL/min (ref >60)
GFR calc non Af Amer: >60 mL/min (ref >60)
Glucose, Bld: 108 mg/dL — ABNORMAL HIGH (ref 70–99)
Potassium: 3.6 mmol/L (ref 3.5–5.1)
Sodium: 139 mmol/L (ref 135–145)
Total Bilirubin: 0.6 mg/dL (ref 0.3–1.2)
Total Protein: 8.8 g/dL — ABNORMAL HIGH (ref 6.5–8.1)

## 2019-06-06 LAB — CBC
HCT: 41.9 % (ref 36.0–46.0)
Hemoglobin: 12.7 g/dL (ref 12.0–15.0)
MCH: 26.5 pg (ref 26.0–34.0)
MCHC: 30.3 g/dL (ref 30.0–36.0)
MCV: 87.5 fL (ref 80.0–100.0)
Platelets: 353 10*3/uL (ref 150–400)
RBC: 4.79 MIL/uL (ref 3.87–5.11)
RDW: 13.1 % (ref 11.5–15.5)
WBC: 7.2 10*3/uL (ref 4.0–10.5)
nRBC: 0 % (ref 0.0–0.2)

## 2019-06-06 LAB — LIPASE, BLOOD: Lipase: 27 U/L (ref 11–51)

## 2019-06-06 LAB — URINALYSIS, ROUTINE W REFLEX MICROSCOPIC
Bilirubin Urine: NEGATIVE
Glucose, UA: NEGATIVE mg/dL
Hgb urine dipstick: NEGATIVE
Ketones, ur: NEGATIVE mg/dL
Nitrite: NEGATIVE
Protein, ur: NEGATIVE mg/dL
Specific Gravity, Urine: 1.02 (ref 1.005–1.030)
pH: 5 (ref 5.0–8.0)

## 2019-06-06 MED ORDER — MORPHINE SULFATE (PF) 4 MG/ML IV SOLN
4.0000 mg | Freq: Once | INTRAVENOUS | Status: AC
  Administered 2019-06-06: 4 mg via INTRAVENOUS
  Filled 2019-06-06: qty 1

## 2019-06-06 MED ORDER — HALOPERIDOL LACTATE 5 MG/ML IJ SOLN
2.0000 mg | Freq: Once | INTRAMUSCULAR | Status: AC
  Administered 2019-06-06: 2 mg via INTRAVENOUS
  Filled 2019-06-06: qty 1

## 2019-06-06 MED ORDER — SODIUM CHLORIDE 0.9% FLUSH: 3.0000 mL | Freq: Once | INTRAVENOUS | Status: DC

## 2019-06-06 MED ORDER — SODIUM CHLORIDE 0.9 % IV BOLUS
1000.0000 mL | Freq: Once | INTRAVENOUS | Status: AC
  Administered 2019-06-06: 1000 mL via INTRAVENOUS

## 2019-06-06 MED ORDER — ONDANSETRON HCL 4 MG/2ML IJ SOLN: 4.0000 mg | Freq: Once | INTRAMUSCULAR | Status: DC

## 2019-06-06 NOTE — ED Triage Notes (Signed)
Patient states she has been having abdominal pain "for a while" but has gotten worse the past couple of weeks.   5/10 sharp mid to right abdominal pain Patient states after she eats she has 10/10 pain.   Patient states a few months ago she was here and told she was constipated. Patient has been taking stool softener ever since.    Last BM- Normal for patient.   Also C/O N/V  2 occurences of emesis today.  Denies diarrhea.     A/ox4 Ambulatory in triage.

## 2019-06-06 NOTE — ED Notes (Signed)
Pt ambulated to bathroom with steady gait, without assistance.

## 2019-06-07 ENCOUNTER — Emergency Department (HOSPITAL_COMMUNITY): Payer: Self-pay

## 2019-06-07 MED ORDER — PROMETHAZINE HCL 25 MG RE SUPP: 25.0000 mg | Freq: Four times a day (QID) | RECTAL | 3 refills | Status: DC | PRN

## 2019-06-07 MED ORDER — OMEPRAZOLE 20 MG PO CPDR: 20.0000 mg | DELAYED_RELEASE_CAPSULE | Freq: Two times a day (BID) | ORAL | 0 refills | Status: DC

## 2019-06-07 NOTE — ED Provider Notes (Signed)
Patient signed out to me with CT pending.  CT has been performed and reviewed by radiologist.  No pathology is noted.  Patient will likely benefit from outpatient GI follow-up, but otherwise does not require any further work-up here from an emergency standpoint   Orpah Greek, MD 06/07/19 [phone]
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
## 2019-06-07 NOTE — ED Notes (Signed)
Pt transported to CT ?

## 2019-06-24 ENCOUNTER — Encounter: Payer: Self-pay | Admitting: Internal Medicine

## 2019-08-07 ENCOUNTER — Ambulatory Visit: Payer: Self-pay | Admitting: Nurse Practitioner

## 2019-08-18 ENCOUNTER — Ambulatory Visit (INDEPENDENT_AMBULATORY_CARE_PROVIDER_SITE_OTHER): Payer: BLUE CROSS/BLUE SHIELD | Admitting: Nurse Practitioner

## 2019-08-18 ENCOUNTER — Other Ambulatory Visit: Payer: Self-pay

## 2019-08-18 ENCOUNTER — Encounter: Payer: Self-pay | Admitting: Nurse Practitioner

## 2019-08-18 VITALS — BP 124/80 | HR 88 | Temp 97.6°F | Ht 60.0 in | Wt 170.0 lb

## 2019-08-18 DIAGNOSIS — R1013 Epigastric pain: Secondary | ICD-10-CM | POA: Diagnosis not present

## 2019-08-18 DIAGNOSIS — Z1211 Encounter for screening for malignant neoplasm of colon: Secondary | ICD-10-CM

## 2019-08-18 DIAGNOSIS — R194 Change in bowel habit: Secondary | ICD-10-CM | POA: Diagnosis not present

## 2019-08-18 DIAGNOSIS — R112 Nausea with vomiting, unspecified: Secondary | ICD-10-CM

## 2019-08-18 MED ORDER — ONDANSETRON 4 MG PO TBDP: 4.0000 mg | ORAL_TABLET | Freq: Three times a day (TID) | ORAL | 2 refills | Status: DC | PRN

## 2019-08-18 MED ORDER — NA SULFATE-K SULFATE-MG SULF 17.5-3.13-1.6 GM/177ML PO SOLN: ORAL | 0 refills | Status: DC

## 2019-08-18 NOTE — Progress Notes (Signed)
ASSESSMENT / PLAN:   68.  48 year old female for colon cancer screening.  Colonoscopy 2014 with inadequate prep. -The risks and benefits of colonoscopy with possible polypectomy / biopsies were discussed and the patient agrees to proceed.  -She will need a 2-day bowel prep given history of chronic constipation and inadequate bowel prep on last colonoscopy  2.  Several month history of postprandial upper abdominal pain, nausea and vomiting.  Evaluated twice by the ED for the symptoms.  CTAP without contrast and labs unremarkable.  Absence of weight loss, remarkable imaging and labs is reassuring -For further evaluation will schedule for EGD to be done at time of colonoscopy. The risks and benefits of EGD were discussed and the patient agrees to proceed.  -Refill Zofran per her request  3.  GERD, several month history of heartburn relieved with Pepcid or Prilosec which she needs about twice a week.  -Continue PPI or H2 blocker as needed for heartburn.  Recent weight gain likely contributing to symptoms   HPI:    Chief Complaint:   Abdominal pain   Patient is a 48 year old female with a history of DVT, lupus, and seizures (none recent). She is known to Pyrtle from a screening colonoscopy in 2014.  The bowel prep was inadequate, she is overdue for another colonoscopy.  Patient received her colonoscopy recall letter but came in for an appointment to discuss some of the problems she has been having.  Over the last 4 months she has been having upper abdominal pain, nausea and vomiting and minor bowel changes.  The upper abdominal pain is sharp, stabbing and very intense.  It is 100 times worse with food.  It can last for hours then slowly subsides.  Even water hurts sometimes.  Patient does not take any NSAIDs.  After a meal when the upper abdominal pain starts she frequently has associated nausea and vomiting.  She also has the urge to defecate but sits on the toilet for extended  amount of time and may or may not have a bowel movement.  Of note, patient says that all of her life she is averaged 1 bowel movement a month.  In the last 4 months she has been having about 3 a month which is a big change for her.  Despite all of the symptoms she has not had any associated weight loss in fact she has gained weight.  Se was seen in the ED in March as well as late June for the symptoms.  Labs unremarkable.  CT AP without contrast at the time of her June visit was negative for any acute abdominal finding. She reports occasional hemorrhoid bleeding  In addition to above patient developed GERD symptoms several months ago.  She has been taking either Prilosec or Pepcid as needed which is about twice a week and both work equally well.   Past Medical History:  Diagnosis Date  . DVT (deep venous thrombosis) (HCC)    right arm  . Endometriosis   . Lupus (systemic lupus erythematosus) ([place]) 2007  . Migraine   . Seizures ([place])    last sz 06/2013, no meds     Past Surgical History:  Procedure Laterality Date  . ABDOMINAL HYSTERECTOMY     x2 partial  . CESAREAN SECTION     x3  . MASS EXCISION  02/2014   abd   Family History  Problem Relation Age  of Onset  . CVA Mother 16  . Alzheimer's disease Father   . Breast cancer Sister   . Ovarian cancer Sister   . Stomach cancer Sister   . Breast cancer Maternal Aunt   . Ovarian cancer Maternal Aunt   . Stomach cancer Maternal Aunt   . Birth defects Maternal Uncle   . Colon cancer Neg Hx   . Esophageal cancer Neg Hx   . Pancreatic cancer Neg Hx    Social History   Tobacco Use  . Smoking status: Never Smoker  . Smokeless tobacco: Never Used  Substance Use Topics  . Alcohol use: No  . Drug use: No   Current Outpatient Medications  Medication Sig Dispense Refill  . acetaminophen (TYLENOL) 500 MG tablet Take 500-1,000 mg by mouth every 6 (six) hours as needed for mild pain, moderate pain or headache.     . blood glucose meter  kit and supplies KIT Dispense based on patient and insurance preference. Use up to four times daily as directed. (FOR ICD-9 250.00, 250.01). 1 each 0  . cholecalciferol (VITAMIN D) 1000 units tablet Take 1,000 Units by mouth daily.    Marland Kitchen dicyclomine (BENTYL) 20 MG tablet Take 1 tablet (20 mg total) by mouth 2 (two) times daily as needed for spasms. 20 tablet 0  . EPINEPHrine (EPIPEN 2-PAK) 0.3 mg/0.3 mL IJ SOAJ injection Inject 0.3 mg into the muscle once as needed (for severe allergic reaction).    . famotidine (PEPCID) 20 MG tablet Take 1 tablet (20 mg total) by mouth 2 (two) times daily. 30 tablet 0  . omeprazole (PRILOSEC) 20 MG capsule Take 1 capsule (20 mg total) by mouth 2 (two) times daily before a meal. 28 capsule 0  . ondansetron (ZOFRAN ODT) 4 MG disintegrating tablet Take 1 tablet (4 mg total) by mouth every 8 (eight) hours as needed for nausea. 10 tablet 0  . promethazine (PHENERGAN) 25 MG suppository Place 1 suppository (25 mg total) rectally every 6 (six) hours as needed for nausea or vomiting. 12 each 3  . vitamin B-12 (CYANOCOBALAMIN) 1000 MCG tablet Take 1,000 mcg by mouth daily.     No current facility-administered medications for this visit.    Allergies  Allergen Reactions  . Bee Venom Anaphylaxis  . Diamox [Acetazolamide] Anaphylaxis  . Nsaids Anaphylaxis  . Omnipaque [Iohexol] Anaphylaxis  . Tolmetin Anaphylaxis  . Compazine [Prochlorperazine] Hives  . Iopamidol Hives  . Lactose Intolerance (Gi) Nausea And Vomiting  . Reglan [Metoclopramide] Hives     Review of Systems: Positive for back pain, muscle pain and cramps, night sweats, sleeping problems and swelling of feet and legs . All other systems reviewed and negative except where noted in HPI.   Physical Exam:    Wt Readings from Last 3 Encounters:  08/18/19 170 lb (77.1 kg)  02/19/19 167 lb (75.8 kg)  11/20/18 167 lb (75.8 kg)    BP 124/80   Pulse 88   Temp 97.6 F (36.4 C)   Ht 5' (1.524 m)   Wt 170  lb (77.1 kg)   LMP 06/14/2003   BMI 33.20 kg/m  Constitutional:  Pleasant female in no acute distress. Psychiatric: Normal mood and affect. Behavior is normal. EENT: Pupils normal.  Conjunctivae are normal. No scleral icterus. Neck supple.  Cardiovascular: Normal rate, regular rhythm. No edema Pulmonary/chest: Effort normal and breath sounds normal. No wheezing, rales or rhonchi. Abdominal: Soft, nondistended, nontender. Bowel sounds active throughout. There are no masses palpable.  No hepatomegaly, + diastasis recti. Neurological: Alert and oriented to person place and time. Skin: Skin is warm and dry. No rashes noted.  Tye Savoy, NP  08/18/2019, 9:03 AM

## 2019-08-18 NOTE — Patient Instructions (Signed)
If you are age 50 or older, your body mass index should be between 23-30. Your Body mass index is 33.2 kg/m. If this is out of the aforementioned range listed, please consider follow up with your Primary Care Provider.  If you are age 5 or younger, your body mass index should be between 19-25. Your Body mass index is 33.2 kg/m. If this is out of the aformentioned range listed, please consider follow up with your Primary Care Provider.   You have been scheduled for an endoscopy and colonoscopy. Please follow the written instructions given to you at your visit today. Please pick up your prep supplies at the pharmacy within the next 1-3 days. If you use inhalers (even only as needed), please bring them with you on the day of your procedure. Your physician has requested that you go to www.startemmi.com and enter the access code given to you at your visit today. This web site gives a general overview about your procedure. However, you should still follow specific instructions given to you by our office regarding your preparation for the procedure.  We have sent the following medications to your pharmacy for you to pick up at your convenience: Suprep Zofran  Thank you for choosing me and [facility].   Tye Savoy, NP

## 2019-08-25 ENCOUNTER — Telehealth: Payer: Self-pay

## 2019-08-25 NOTE — Telephone Encounter (Signed)
Patient does want help finding a new GI doctor.

## 2019-08-25 NOTE — Telephone Encounter (Signed)
Patient's insurance will not cover visits or procedures done by any [facility] practice. Patient notified of this by CMA. I have called the patient to offer help with getting her record of the recent visit to any physician of her choice that participates with her insurance. She did not answer. I have left a message with the offer to assist her if she wants me to.

## 2019-08-25 NOTE — Telephone Encounter (Signed)
Pt returned your call.  

## 2019-08-28 ENCOUNTER — Telehealth: Payer: Self-pay

## 2019-08-28 NOTE — Telephone Encounter (Signed)
Donna [name] has already taken care of this.

## 2019-08-28 NOTE — Telephone Encounter (Signed)
-----   Message from Willia Craze, NP sent at 08/26/2019 11:41 AM EDT ----- Peter Congo, if you have not already please let patient know that we are out of network. I am sorry that this wasn't known prior to her being seen here. Please ask her to make arrangements to see [facility] GI provider since it will be less expensive for her if she stays in network. Let her know too that they can access our records through [facility]. Please convert this to a phone note for our records. Thanks [employer]

## 2019-08-28 NOTE — Telephone Encounter (Signed)
Records faxed to Dr Exie Parody. She is affiliated with [facility] Office [phone] fax [phone]

## 2019-09-04 ENCOUNTER — Encounter: Payer: BLUE CROSS/BLUE SHIELD | Admitting: Internal Medicine

## 2019-09-13 NOTE — Progress Notes (Signed)
Addendum: Reviewed and agree with assessment and management plan. Pyrtle, Jay M, MD  

## 2020-06-27 ENCOUNTER — Encounter (HOSPITAL_BASED_OUTPATIENT_CLINIC_OR_DEPARTMENT_OTHER): Payer: Self-pay | Admitting: Emergency Medicine

## 2020-06-27 ENCOUNTER — Other Ambulatory Visit: Payer: Self-pay

## 2020-06-27 ENCOUNTER — Emergency Department (HOSPITAL_BASED_OUTPATIENT_CLINIC_OR_DEPARTMENT_OTHER): Payer: Self-pay

## 2020-06-27 ENCOUNTER — Emergency Department (HOSPITAL_BASED_OUTPATIENT_CLINIC_OR_DEPARTMENT_OTHER)
Admission: EM | Admit: 2020-06-27 | Discharge: 2020-06-28 | Disposition: A | Discharge status: Home / Self Care | Payer: Self-pay | Attending: Emergency Medicine | Admitting: Emergency Medicine

## 2020-06-27 DIAGNOSIS — T781XXA Other adverse food reactions, not elsewhere classified, initial encounter: Secondary | ICD-10-CM | POA: Insufficient documentation

## 2020-06-27 DIAGNOSIS — R1032 Left lower quadrant pain: Secondary | ICD-10-CM | POA: Insufficient documentation

## 2020-06-27 LAB — URINALYSIS, ROUTINE W REFLEX MICROSCOPIC
Bilirubin Urine: NEGATIVE
Glucose, UA: 100 mg/dL — AB
Ketones, ur: NEGATIVE mg/dL
Nitrite: NEGATIVE
Protein, ur: NEGATIVE mg/dL
Specific Gravity, Urine: 1.025 (ref 1.005–1.030)
pH: 6 (ref 5.0–8.0)

## 2020-06-27 LAB — URINALYSIS, MICROSCOPIC (REFLEX)

## 2020-06-27 LAB — RAPID URINE DRUG SCREEN, HOSP PERFORMED
Amphetamines: NOT DETECTED
Barbiturates: NOT DETECTED
Benzodiazepines: NOT DETECTED
Cocaine: NOT DETECTED
Opiates: NOT DETECTED
Tetrahydrocannabinol: NOT DETECTED

## 2020-06-27 MED ORDER — FENTANYL CITRATE (PF) 100 MCG/2ML IJ SOLN
100.0000 ug | Freq: Once | INTRAMUSCULAR | Status: AC
  Administered 2020-06-28: 100 ug via INTRAVENOUS
  Filled 2020-06-27: qty 2

## 2020-06-27 MED ORDER — ONDANSETRON HCL 4 MG/2ML IJ SOLN
4.0000 mg | Freq: Once | INTRAMUSCULAR | Status: AC
  Administered 2020-06-28: 4 mg via INTRAVENOUS
  Filled 2020-06-27: qty 2

## 2020-06-27 MED ORDER — SODIUM CHLORIDE 0.9 % IV BOLUS
1000.0000 mL | Freq: Once | INTRAVENOUS | Status: AC
  Administered 2020-06-28: 1000 mL via INTRAVENOUS

## 2020-06-27 NOTE — ED Triage Notes (Addendum)
Pt reports left sided abdominal pain and N/V for about two weeks. Recent endoscopy and colonoscopy with results of severe acid reflux. Gas x and phenergan not effective for nausea/vomiting/pain. Reports more frequent stools but no loose stools/diarrhea.

## 2020-06-27 NOTE — ED Provider Notes (Signed)
[facility] Provider Note: [name], MD, FACEP  CSN: [id number] MRN: [id number] ARRIVAL: 06/27/20 at 2049 ROOM: [place]  Abdominal Pain   HISTORY OF PRESENT ILLNESS  06/27/20 11:09 PM Donna [name] is a 49 y.o. female with a history of GERD.  She is here with about 2 weeks of abdominal pain, nausea and vomiting that occur after she eats.  This occurs about 30 minutes after eating.  This is most prominent when she eats beef or pork.  She has much less symptomatology when eating chicken, seafood or vegetables.  Her symptoms have gradually worsened and she is having severe left lower quadrant pain, worse with eating as noted above and with movement or palpation.  It is sharp in nature.  She has not had diarrhea.  She normally moves her bowels about once a month but lately has been moving her bowels weekly.  They have not been loose or otherwise abnormal.  She has been exposed to tick bites.  Past Medical History:  Diagnosis Date  . DVT (deep venous thrombosis) (HCC)    right arm  . Endometriosis   . Lupus (systemic lupus erythematosus) ([place]) 2007  . Migraine   . Seizures ([place])    last sz 06/2013, no meds    Past Surgical History:  Procedure Laterality Date  . ABDOMINAL HYSTERECTOMY     x2 partial  . CESAREAN SECTION     x3  . MASS EXCISION  02/2014   abd    Family History  Problem Relation Age of Onset  . CVA Mother 38  . Alzheimer's disease Father   . Breast cancer Sister   . Ovarian cancer Sister   . Stomach cancer Sister   . Breast cancer Maternal Aunt   . Ovarian cancer Maternal Aunt   . Stomach cancer Maternal Aunt   . Birth defects Maternal Uncle   . Colon cancer Neg Hx   . Esophageal cancer Neg Hx   . Pancreatic cancer Neg Hx     Social History   Tobacco Use  . Smoking status: Never Smoker  . Smokeless tobacco: Never Used  Vaping Use  . Vaping Use: Never used  Substance Use Topics  . Alcohol use: No  . Drug use:  No    Prior to Admission medications   Medication Sig Start Date End Date Taking? Authorizing Provider  blood glucose meter kit and supplies KIT Dispense based on patient and insurance preference. Use up to four times daily as directed. (FOR ICD-9 250.00, 250.01). 03/11/18   Raiford Noble Latif, DO  cholecalciferol (VITAMIN D) 1000 units tablet Take 1,000 Units by mouth daily.    [provider]  EPINEPHrine (EPIPEN 2-PAK) 0.3 mg/0.3 mL IJ SOAJ injection Inject 0.3 mLs (0.3 mg total) into the muscle once as needed (for severe allergic reaction). 06/28/20   Kiala Faraj, Jenny Reichmann, MD  ondansetron (ZOFRAN ODT) 8 MG disintegrating tablet Take 1 tablet (8 mg total) by mouth every 8 (eight) hours as needed for nausea or vomiting. 06/28/20   Sumayya Muha, MD  vitamin B-12 (CYANOCOBALAMIN) 1000 MCG tablet Take 1,000 mcg by mouth daily.    [provider]  dicyclomine (BENTYL) 20 MG tablet Take 1 tablet (20 mg total) by mouth 2 (two) times daily as needed for spasms. 02/20/19 06/28/20  Rodell Perna A, PA-C  famotidine (PEPCID) 20 MG tablet Take 1 tablet (20 mg total) by mouth 2 (two) times daily. 02/20/19 06/28/20  Rodell Perna  A, PA-C  ipratropium-albuterol (DUONEB) 0.5-2.5 (3) MG/3ML SOLN Take 3 mLs by nebulization every 4 (four) hours as needed. Patient not taking: Reported on 09/22/2018 03/11/18 06/07/19  Raiford Noble Latif, DO  omeprazole (PRILOSEC) 20 MG capsule Take 1 capsule (20 mg total) by mouth 2 (two) times daily before a meal. 06/07/19 06/28/20  Pollina, Gwenyth Allegra, MD  promethazine (PHENERGAN) 25 MG suppository Place 1 suppository (25 mg total) rectally every 6 (six) hours as needed for nausea or vomiting. 06/07/19 06/28/20  Orpah Greek, MD  sucralfate (CARAFATE) 1 g tablet Take 1 tablet (1 g total) by mouth 4 (four) times daily -  with meals and at bedtime. Patient not taking: Reported on 06/06/2019 02/20/19 06/07/19  Rodell Perna A, PA-C    Allergies Bee venom, Diamox [acetazolamide],  Nsaids, Omnipaque [iohexol], Tolmetin, Compazine [prochlorperazine], Iopamidol, Lactose intolerance (gi), and Reglan [metoclopramide]   REVIEW OF SYSTEMS  Negative except as noted here or in the History of Present Illness.   PHYSICAL EXAMINATION  Initial Vital Signs Blood pressure (!) 161/105, pulse (!) 108, temperature 98.3 F (36.8 C), temperature source Oral, resp. rate (!) 22, weight 70 kg, last menstrual period 06/14/2003, SpO2 98 %.  Examination General: Well-developed, well-nourished female in no acute distress; appearance consistent with age of record HENT: normocephalic; atraumatic Eyes: pupils equal, round and reactive to light; extraocular muscles intact Neck: supple Heart: regular rate and rhythm Lungs: clear to auscultation bilaterally Abdomen: soft; mildly distended; left lower quadrant tenderness; bowel sounds hyperactive Extremities: No deformity; full range of motion; pulses normal Neurologic: Awake, alert and oriented; motor function intact in all extremities and symmetric; no facial droop Skin: Warm and dry Psychiatric: Normal mood and affect   RESULTS  Summary of this visit's results, reviewed and interpreted by myself:   EKG Interpretation  Date/Time:    Ventricular Rate:    PR Interval:    QRS Duration:   QT Interval:    QTC Calculation:   R Axis:     Text Interpretation:        Laboratory Studies: Results for orders placed or performed during the hospital encounter of 06/27/20 (from the past 24 hour(s))  Urinalysis, Routine w reflex microscopic     Status: Abnormal   Collection Time: 06/27/20  9:24 PM  Result Value Ref Range   Color, Urine YELLOW YELLOW   APPearance CLEAR CLEAR   Specific Gravity, Urine 1.025 1.005 - 1.030   pH 6.0 5.0 - 8.0   Glucose, UA 100 (A) NEGATIVE mg/dL   Hgb urine dipstick TRACE (A) NEGATIVE   Bilirubin Urine NEGATIVE NEGATIVE   Ketones, ur NEGATIVE NEGATIVE mg/dL   Protein, ur NEGATIVE NEGATIVE mg/dL   Nitrite  NEGATIVE NEGATIVE   Leukocytes,Ua TRACE (A) NEGATIVE  Urinalysis, Microscopic (reflex)     Status: Abnormal   Collection Time: 06/27/20  9:24 PM  Result Value Ref Range   RBC / HPF 0-5 0 - 5 RBC/hpf   WBC, UA 0-5 0 - 5 WBC/hpf   Bacteria, UA RARE (A) NONE SEEN   Squamous Epithelial / LPF 6-10 0 - 5   Mucus PRESENT   Rapid urine drug screen (hospital performed)     Status: None   Collection Time: 06/27/20  9:24 PM  Result Value Ref Range   Opiates NONE DETECTED NONE DETECTED   Cocaine NONE DETECTED NONE DETECTED   Benzodiazepines NONE DETECTED NONE DETECTED   Amphetamines NONE DETECTED NONE DETECTED   Tetrahydrocannabinol NONE DETECTED NONE DETECTED  Barbiturates NONE DETECTED NONE DETECTED  CBC with Differential/Platelet     Status: None   Collection Time: 06/28/20 12:01 AM  Result Value Ref Range   WBC 7.8 4.0 - 10.5 K/uL   RBC 4.47 3.87 - 5.11 MIL/uL   Hemoglobin 12.2 12.0 - 15.0 g/dL   HCT 38.3 36 - 46 %   MCV 85.7 80.0 - 100.0 fL   MCH 27.3 26.0 - 34.0 pg   MCHC 31.9 30.0 - 36.0 g/dL   RDW 13.2 11.5 - 15.5 %   Platelets 327 150 - 400 K/uL   nRBC 0.0 0.0 - 0.2 %   Neutrophils Relative % 52 %   Neutro Abs 4.1 1.7 - 7.7 K/uL   Lymphocytes Relative 38 %   Lymphs Abs 3.0 0.7 - 4.0 K/uL   Monocytes Relative 7 %   Monocytes Absolute 0.5 0 - 1 K/uL   Eosinophils Relative 2 %   Eosinophils Absolute 0.2 0 - 0 K/uL   Basophils Relative 1 %   Basophils Absolute 0.1 0 - 0 K/uL   Immature Granulocytes 0 %   Abs Immature Granulocytes 0.03 0.00 - 0.07 K/uL  Comprehensive metabolic panel     Status: Abnormal   Collection Time: 06/28/20 12:01 AM  Result Value Ref Range   Sodium 140 135 - 145 mmol/L   Potassium 4.3 3.5 - 5.1 mmol/L   Chloride 103 98 - 111 mmol/L   CO2 24 22 - 32 mmol/L   Glucose, Bld 131 (H) 70 - 99 mg/dL   BUN 19 6 - 20 mg/dL   Creatinine, Ser 0.73 0.44 - 1.00 mg/dL   Calcium 9.5 8.9 - 10.3 mg/dL   Total Protein 8.3 (H) 6.5 - 8.1 g/dL   Albumin 4.7 3.5 -  5.0 g/dL   AST 25 15 - 41 U/L   ALT 21 0 - 44 U/L   Alkaline Phosphatase 114 38 - 126 U/L   Total Bilirubin 0.1 (L) 0.3 - 1.2 mg/dL   GFR calc non Af Amer >60 >60 mL/min   GFR calc Af Amer >60 >60 mL/min   Anion gap 13 5 - 15  Lipase, blood     Status: None   Collection Time: 06/28/20 12:01 AM  Result Value Ref Range   Lipase 32 11 - 51 U/L   Imaging Studies: CT ABDOMEN PELVIS WO CONTRAST  Result Date: 06/28/2020 CLINICAL DATA:  49 year old female with left side abdominal pain with nausea and vomiting for 2 weeks. Recent endoscopy and colonoscopy. EXAM: CT ABDOMEN AND PELVIS WITHOUT CONTRAST TECHNIQUE: Multidetector CT imaging of the abdomen and pelvis was performed following the standard protocol without IV contrast. COMPARISON:  Noncontrast CT Abdomen and Pelvis 10/11/2019 FINDINGS: Lower chest: Cardiac size at the upper limits of normal. Otherwise negative. Hepatobiliary: Negative noncontrast liver and gallbladder. Pancreas: Negative. Spleen: Stable and negative. Adrenals/Urinary Tract: Left renal midpole cyst with simple fluid density again noted. Otherwise negative noncontrast kidneys. No nephrolithiasis or hydronephrosis. Both ureters are decompressed and normal to the urinary bladder. Multiple mostly left side pelvic phleboliths. Unremarkable bladder. Stomach/Bowel: Redundant large bowel with retained stool throughout. Highly redundant sigmoid colon. Superimposed gas distended rectum. Oral contrast administered and has reached the ileocecal valve. Normal appendix on series 2, image 58. No dilated small bowel. No large or small bowel inflammation identified. Largely decompressed and unremarkable stomach and duodenum. No free air, free fluid. Chronic postoperative changes to the ventral lower abdominal wall are stable. Vascular/Lymphatic: Normal caliber aorta. Vascular patency  is not evaluated in the absence of IV contrast. No lymphadenopathy. Reproductive: Surgically absent uterus, diminutive  or absent ovaries. Other: No pelvis free fluid. Musculoskeletal: No acute osseous abnormality identified. IMPRESSION: 1. No acute or inflammatory process identified in the non-contrast abdomen or pelvis. 2. Redundant large bowel with retained stool. 3. No urinary calculus or obstructive uropathy. Electronically Signed   By: Genevie Ann M.D.   On: 06/28/2020 02:39    ED COURSE and MDM  Nursing notes, initial and subsequent vitals signs, including pulse oximetry, reviewed and interpreted by myself.  Vitals:   06/28/20 0212 06/28/20 0242 06/28/20 0243 06/28/20 0330  BP: (!) 139/101 (!) 148/96  139/87  Pulse: 81  85 71  Resp: 14     Temp: 98.1 F (36.7 C)     TempSrc: Oral     SpO2: 100%  100% 98%  Weight:       Medications  sodium chloride 0.9 % bolus 1,000 mL (0 mLs Intravenous Stopped 06/28/20 0120)  ondansetron (ZOFRAN) injection 4 mg (4 mg Intravenous Given 06/28/20 0008)  fentaNYL (SUBLIMAZE) injection 100 mcg (100 mcg Intravenous Given 06/28/20 0008)  fentaNYL (SUBLIMAZE) injection 100 mcg (100 mcg Intravenous Given 06/28/20 0233)   The patient was advised of the CT scan showing in no acute process.  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  It is also noteworthy that she has a history of tick bites preceding the onset of symptoms.  Alpha gal IgE assay has been sent.  She was advised that in the meantime she should avoid all mammal products.  She was advised to avoid any beef, pork, lamb or other mental products including lard, bacon, lunch meat, etc.  She was advised that poultry (chicken, [place], duck) and seafood are not affected by alpha gal allergy.  We will refer to gastroenterology for further evaluation especially if her alpha gal is negative.   PROCEDURES  Procedures   ED DIAGNOSES     ICD-10-CM   1. Left lower quadrant abdominal pain  R10.32   2. 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

## 2020-06-28 LAB — COMPREHENSIVE METABOLIC PANEL
ALT: 21 U/L (ref 0–44)
AST: 25 U/L (ref 15–41)
Albumin: 4.7 g/dL (ref 3.5–5.0)
Alkaline Phosphatase: 114 U/L (ref 38–126)
Anion gap: 13 (ref 5–15)
BUN: 19 mg/dL (ref 6–20)
CO2: 24 mmol/L (ref 22–32)
Calcium: 9.5 mg/dL (ref 8.9–10.3)
Chloride: 103 mmol/L (ref 98–111)
Creatinine, Ser: 0.73 mg/dL (ref 0.44–1.00)
GFR calc Af Amer: >60 mL/min (ref >60)
GFR calc non Af Amer: >60 mL/min (ref >60)
Glucose, Bld: 131 mg/dL — ABNORMAL HIGH (ref 70–99)
Potassium: 4.3 mmol/L (ref 3.5–5.1)
Sodium: 140 mmol/L (ref 135–145)
Total Bilirubin: 0.1 mg/dL — ABNORMAL LOW (ref 0.3–1.2)
Total Protein: 8.3 g/dL — ABNORMAL HIGH (ref 6.5–8.1)

## 2020-06-28 LAB — CBC WITH DIFFERENTIAL/PLATELET
Abs Immature Granulocytes: 0.03 10*3/uL (ref 0.00–0.07)
Basophils Absolute: 0.1 10*3/uL (ref 0.0–0.1)
Basophils Relative: 1 %
Eosinophils Absolute: 0.2 10*3/uL (ref 0.0–0.5)
Eosinophils Relative: 2 %
HCT: 38.3 % (ref 36.0–46.0)
Hemoglobin: 12.2 g/dL (ref 12.0–15.0)
Immature Granulocytes: 0 %
Lymphocytes Relative: 38 %
Lymphs Abs: 3 10*3/uL (ref 0.7–4.0)
MCH: 27.3 pg (ref 26.0–34.0)
MCHC: 31.9 g/dL (ref 30.0–36.0)
MCV: 85.7 fL (ref 80.0–100.0)
Monocytes Absolute: 0.5 10*3/uL (ref 0.1–1.0)
Monocytes Relative: 7 %
Neutro Abs: 4.1 10*3/uL (ref 1.7–7.7)
Neutrophils Relative %: 52 %
Platelets: 327 10*3/uL (ref 150–400)
RBC: 4.47 MIL/uL (ref 3.87–5.11)
RDW: 13.2 % (ref 11.5–15.5)
WBC: 7.8 10*3/uL (ref 4.0–10.5)
nRBC: 0 % (ref 0.0–0.2)

## 2020-06-28 LAB — LIPASE, BLOOD: Lipase: 32 U/L (ref 11–51)

## 2020-06-28 MED ORDER — FENTANYL CITRATE (PF) 100 MCG/2ML IJ SOLN
100.0000 ug | Freq: Once | INTRAMUSCULAR | Status: AC
  Administered 2020-06-28: 100 ug via INTRAVENOUS
  Filled 2020-06-28: qty 2

## 2020-06-28 MED ORDER — EPINEPHRINE 0.3 MG/0.3ML IJ SOAJ: 0.3000 mg | Freq: Once | INTRAMUSCULAR | 1 refills | Status: AC | PRN

## 2020-06-28 MED ORDER — ONDANSETRON 8 MG PO TBDP: 8.0000 mg | ORAL_TABLET | Freq: Three times a day (TID) | ORAL | 0 refills | Status: DC | PRN

## 2020-06-28 NOTE — Discharge Instructions (Signed)
You may have a rare condition called galactose-1,3-alpha-galactose allergy (commonly called "alpha gal" allergy).  This is an allergy to mammal meat (beef, pork, lamb, venison, etc.) that is caused by a bite from a [place] tick.  Signs of this condition include vomiting and diarrhea within 1 to 6 hours after eating.  Symptoms may also include hives, difficulty breathing or throat swelling.  Until we know for sure if you have this condition you should avoid all mammal products, including bacon, lard, luncheon meats and hot dogs.  Poultry (chicken, [place], duct) and seafood (fish, shrimp, shellfish) are all safe to eat with this condition.

## 2020-07-04 LAB — MISC LABCORP TEST (SEND OUT): Labcorp test code: 806562

## 2020-08-21 ENCOUNTER — Emergency Department (HOSPITAL_COMMUNITY): Payer: Self-pay

## 2020-08-21 ENCOUNTER — Emergency Department (HOSPITAL_COMMUNITY)
Admission: EM | Admit: 2020-08-21 | Discharge: 2020-08-21 | Disposition: A | Discharge status: Home / Self Care | Payer: Self-pay | Attending: Emergency Medicine | Admitting: Emergency Medicine

## 2020-08-21 ENCOUNTER — Encounter (HOSPITAL_COMMUNITY): Payer: Self-pay

## 2020-08-21 DIAGNOSIS — Z79899 Other long term (current) drug therapy: Secondary | ICD-10-CM | POA: Insufficient documentation

## 2020-08-21 DIAGNOSIS — G43409 Hemiplegic migraine, not intractable, without status migrainosus: Secondary | ICD-10-CM | POA: Insufficient documentation

## 2020-08-21 DIAGNOSIS — R202 Paresthesia of skin: Secondary | ICD-10-CM | POA: Insufficient documentation

## 2020-08-21 DIAGNOSIS — R29810 Facial weakness: Secondary | ICD-10-CM | POA: Insufficient documentation

## 2020-08-21 LAB — CBG MONITORING, ED: Glucose-Capillary: 201 mg/dL — ABNORMAL HIGH (ref 70–99)

## 2020-08-21 LAB — COMPREHENSIVE METABOLIC PANEL
ALT: 27 U/L (ref 0–44)
AST: 32 U/L (ref 15–41)
Albumin: 4.5 g/dL (ref 3.5–5.0)
Alkaline Phosphatase: 106 U/L (ref 38–126)
Anion gap: 11 (ref 5–15)
BUN: 25 mg/dL — ABNORMAL HIGH (ref 6–20)
CO2: 23 mmol/L (ref 22–32)
Calcium: 9.8 mg/dL (ref 8.9–10.3)
Chloride: 105 mmol/L (ref 98–111)
Creatinine, Ser: 1.02 mg/dL — ABNORMAL HIGH (ref 0.44–1.00)
GFR calc Af Amer: >60 mL/min (ref >60)
GFR calc non Af Amer: >60 mL/min (ref >60)
Glucose, Bld: 202 mg/dL — ABNORMAL HIGH (ref 70–99)
Potassium: 3.9 mmol/L (ref 3.5–5.1)
Sodium: 139 mmol/L (ref 135–145)
Total Bilirubin: 0.5 mg/dL (ref 0.3–1.2)
Total Protein: 7.9 g/dL (ref 6.5–8.1)

## 2020-08-21 LAB — PROTIME-INR
INR: 1.1 (ref 0.8–1.2)
Prothrombin Time: 13.3 seconds (ref 11.4–15.2)

## 2020-08-21 LAB — I-STAT CHEM 8, ED
BUN: 31 mg/dL — ABNORMAL HIGH (ref 6–20)
Calcium, Ion: 1.23 mmol/L (ref 1.15–1.40)
Chloride: 105 mmol/L (ref 98–111)
Creatinine, Ser: 0.9 mg/dL (ref 0.44–1.00)
Glucose, Bld: 194 mg/dL — ABNORMAL HIGH (ref 70–99)
HCT: 36 % (ref 36.0–46.0)
Hemoglobin: 12.2 g/dL (ref 12.0–15.0)
Potassium: 4.1 mmol/L (ref 3.5–5.1)
Sodium: 140 mmol/L (ref 135–145)
TCO2: 26 mmol/L (ref 22–32)

## 2020-08-21 LAB — CBC
HCT: 34.6 % — ABNORMAL LOW (ref 36.0–46.0)
Hemoglobin: 10.9 g/dL — ABNORMAL LOW (ref 12.0–15.0)
MCH: 27.6 pg (ref 26.0–34.0)
MCHC: 31.5 g/dL (ref 30.0–36.0)
MCV: 87.6 fL (ref 80.0–100.0)
Platelets: 314 10*3/uL (ref 150–400)
RBC: 3.95 MIL/uL (ref 3.87–5.11)
RDW: 13.2 % (ref 11.5–15.5)
WBC: 7.5 10*3/uL (ref 4.0–10.5)
nRBC: 0 % (ref 0.0–0.2)

## 2020-08-21 LAB — DIFFERENTIAL
Abs Immature Granulocytes: 0.03 10*3/uL (ref 0.00–0.07)
Basophils Absolute: 0 10*3/uL (ref 0.0–0.1)
Basophils Relative: 0 %
Eosinophils Absolute: 0.1 10*3/uL (ref 0.0–0.5)
Eosinophils Relative: 2 %
Immature Granulocytes: 0 %
Lymphocytes Relative: 26 %
Lymphs Abs: 1.9 10*3/uL (ref 0.7–4.0)
Monocytes Absolute: 0.4 10*3/uL (ref 0.1–1.0)
Monocytes Relative: 6 %
Neutro Abs: 4.9 10*3/uL (ref 1.7–7.7)
Neutrophils Relative %: 66 %

## 2020-08-21 LAB — I-STAT BETA HCG BLOOD, ED (MC, WL, AP ONLY): I-stat hCG, quantitative: <5 m[IU]/mL (ref <5)

## 2020-08-21 LAB — APTT: aPTT: 27 seconds (ref 24–36)

## 2020-08-21 MED ORDER — SODIUM CHLORIDE 0.9% FLUSH: 10.0000 mL | Freq: Two times a day (BID) | INTRAVENOUS | Status: DC

## 2020-08-21 MED ORDER — DEXAMETHASONE SODIUM PHOSPHATE 10 MG/ML IJ SOLN
10.0000 mg | Freq: Once | INTRAMUSCULAR | Status: AC
  Administered 2020-08-21: 10 mg via INTRAVENOUS
  Filled 2020-08-21: qty 1

## 2020-08-21 MED ORDER — MORPHINE SULFATE (PF) 4 MG/ML IV SOLN
4.0000 mg | Freq: Once | INTRAVENOUS | Status: AC
  Administered 2020-08-21: 4 mg via INTRAVENOUS
  Filled 2020-08-21: qty 1

## 2020-08-21 MED ORDER — SODIUM CHLORIDE 0.9% FLUSH: 3.0000 mL | Freq: Once | INTRAVENOUS | Status: DC

## 2020-08-21 MED ORDER — GADOBUTROL 1 MMOL/ML IV SOLN
7.0000 mL | Freq: Once | INTRAVENOUS | Status: AC | PRN
  Administered 2020-08-21: 7 mL via INTRAVENOUS

## 2020-08-21 MED ORDER — PREDNISONE 10 MG (21) PO TBPK: ORAL_TABLET | ORAL | 0 refills | Status: DC

## 2020-08-21 MED ORDER — SODIUM CHLORIDE 0.9% FLUSH: 10.0000 mL | INTRAVENOUS | Status: DC | PRN

## 2020-08-21 NOTE — ED Provider Notes (Signed)
[place] EMERGENCY DEPARTMENT Provider Note  CSN: [id number] Arrival date & time: 08/21/20 1705    History Chief Complaint  Patient presents with  . Stroke Symptoms    HPI  Donna [name] is a 49 y.o. female with history of lupus and migraines reports she has had a bad headache the last few days, similar to previous. She reports about 48min prior to arrival while she was at work she began to feel ill and sat down, she states she then began to feel weak on her right side, arm and leg feel heavy. She came directly to the ED but required assistance to get out of the car and into Triage. She reports numbness to the left side of her face as well. She has had similar symptoms several years ago ultimately deemed to be complex migraine or lupus flare but treated with steroid taper and she was discharged home. She is not currently on any steroids.    Past Medical History:  Diagnosis Date  . DVT (deep venous thrombosis) (HCC)    right arm  . Endometriosis   . Lupus (systemic lupus erythematosus) ([place]) 2007  . Migraine   . Seizures ([place])    last sz 06/2013, no meds    Past Surgical History:  Procedure Laterality Date  . ABDOMINAL HYSTERECTOMY     x2 partial  . CESAREAN SECTION     x3  . MASS EXCISION  02/2014   abd    Family History  Problem Relation Age of Onset  . CVA Mother 79  . Alzheimer's disease Father   . Breast cancer Sister   . Ovarian cancer Sister   . Stomach cancer Sister   . Breast cancer Maternal Aunt   . Ovarian cancer Maternal Aunt   . Stomach cancer Maternal Aunt   . Birth defects Maternal Uncle   . Colon cancer Neg Hx   . Esophageal cancer Neg Hx   . Pancreatic cancer Neg Hx     Social History   Tobacco Use  . Smoking status: Never Smoker  . Smokeless tobacco: Never Used  Vaping Use  . Vaping Use: Never used  Substance Use Topics  . Alcohol use: No  . Drug use: No     Home Medications Prior to Admission medications   Medication Sig  Start Date End Date Taking? Authorizing Provider  acetaminophen (TYLENOL) 500 MG tablet Take 500 mg by mouth every 6 (six) hours as needed for moderate pain.   Yes [provider]  cholecalciferol (VITAMIN D) 1000 units tablet Take 1,000 Units by mouth daily.   Yes [provider]  diphenhydrAMINE (BENADRYL) 25 MG tablet Take 25 mg by mouth every 6 (six) hours as needed for allergies.   Yes [provider]  EPINEPHrine (EPIPEN 2-PAK) 0.3 mg/0.3 mL IJ SOAJ injection Inject 0.3 mLs (0.3 mg total) into the muscle once as needed (for severe allergic reaction). 06/28/20  Yes Molpus, Jenny Reichmann, MD  ondansetron (ZOFRAN ODT) 8 MG disintegrating tablet Take 1 tablet (8 mg total) by mouth every 8 (eight) hours as needed for nausea or vomiting. 06/28/20  Yes Molpus, John, MD  vitamin B-12 (CYANOCOBALAMIN) 1000 MCG tablet Take 1,000 mcg by mouth daily.   Yes [provider]  blood glucose meter kit and supplies KIT Dispense based on patient and insurance preference. Use up to four times daily as directed. (FOR ICD-9 250.00, 250.01). 03/11/18   Raiford Noble Latif, DO  predniSONE (STERAPRED UNI-PAK 21 TAB) 10  MG (21) TBPK tablet $RemoveBef'10mg'[id number]$  Tabs, 6 day taper. Use as directed 08/21/20   Truddie Hidden, MD  dicyclomine (BENTYL) 20 MG tablet Take 1 tablet (20 mg total) by mouth 2 (two) times daily as needed for spasms. 02/20/19 06/28/20  Rodell Perna A, PA-C  famotidine (PEPCID) 20 MG tablet Take 1 tablet (20 mg total) by mouth 2 (two) times daily. 02/20/19 06/28/20  Fawze, Mina A, PA-C  ipratropium-albuterol (DUONEB) 0.5-2.5 (3) MG/3ML SOLN Take 3 mLs by nebulization every 4 (four) hours as needed. Patient not taking: Reported on 09/22/2018 03/11/18 06/07/19  Raiford Noble Latif, DO  omeprazole (PRILOSEC) 20 MG capsule Take 1 capsule (20 mg total) by mouth 2 (two) times daily before a meal. 06/07/19 06/28/20  Pollina, Gwenyth Allegra, MD  promethazine (PHENERGAN) 25 MG suppository Place 1 suppository (25 mg  total) rectally every 6 (six) hours as needed for nausea or vomiting. 06/07/19 06/28/20  Orpah Greek, MD  sucralfate (CARAFATE) 1 g tablet Take 1 tablet (1 g total) by mouth 4 (four) times daily -  with meals and at bedtime. Patient not taking: Reported on 06/06/2019 02/20/19 06/07/19  Rodell Perna A, PA-C     Allergies    Bee venom, Diamox [acetazolamide], Nsaids, Omnipaque [iohexol], Tolmetin, Compazine [prochlorperazine], Iopamidol, Lactose intolerance (gi), and Reglan [metoclopramide]   Review of Systems   Review of Systems A comprehensive review of systems was completed and negative except as noted in HPI.    Physical Exam BP (!) 146/106   Pulse 88   Temp 98.6 F (37 C) (Oral)   Resp 20   Ht 5' (1.524 m)   Wt 72.6 kg   LMP 06/14/2003   SpO2 95%   BMI 31.25 kg/m   Physical Exam Vitals and nursing note reviewed.  Constitutional:      Appearance: Normal appearance.  HENT:     Head: Normocephalic and atraumatic.     Nose: Nose normal.     Mouth/Throat:     Mouth: Mucous membranes are moist.  Eyes:     Extraocular Movements: Extraocular movements intact.     Conjunctiva/sclera: Conjunctivae normal.  Cardiovascular:     Rate and Rhythm: Normal rate.  Pulmonary:     Effort: Pulmonary effort is normal.     Breath sounds: Normal breath sounds.  Abdominal:     General: Abdomen is flat.     Palpations: Abdomen is soft.     Tenderness: There is no abdominal tenderness.  Musculoskeletal:        General: No swelling. Normal range of motion.     Cervical back: Neck supple.  Skin:    General: Skin is warm and dry.  Neurological:     Mental Status: She is alert.     Comments: L facial droop, L tongue deviation, R face numbness in V1 and V3 but not V2 distribution. Weakness R arm and leg.   Psychiatric:        Mood and Affect: Mood normal.      ED Results / Procedures / Treatments   Labs (all labs ordered are listed, but only abnormal results are  displayed) Labs Reviewed  CBC - Abnormal; Notable for the following components:      Result Value   Hemoglobin 10.9 (*)    HCT 34.6 (*)    All other components within normal limits  COMPREHENSIVE METABOLIC PANEL - Abnormal; Notable for the following components:   Glucose, Bld 202 (*)    BUN 25 (*)  Creatinine, Ser 1.02 (*)    All other components within normal limits  CBG MONITORING, ED - Abnormal; Notable for the following components:   Glucose-Capillary 201 (*)    All other components within normal limits  I-STAT CHEM 8, ED - Abnormal; Notable for the following components:   BUN 31 (*)    Glucose, Bld 194 (*)    All other components within normal limits  PROTIME-INR  APTT  DIFFERENTIAL  CBG MONITORING, ED  I-STAT BETA HCG BLOOD, ED ([facility], [facility], [facility] ONLY)    EKG EKG Interpretation  Date/Time:  Wednesday August 21 2020 17:17:04 EDT Ventricular Rate:  102 PR Interval:    QRS Duration: 91 QT Interval:  361 QTC Calculation: 471 R Axis:   11 Text Interpretation: Sinus tachycardia Borderline T abnormalities, diffuse leads No significant change since last tracing Confirmed by Calvert Cantor [phone]) on 08/21/2020 5:36:08 PM   Radiology MR ANGIO HEAD WO CONTRAST  Result Date: 08/21/2020 CLINICAL DATA:  Initial evaluation for acute right-sided weakness. EXAM: MRI HEAD WITHOUT CONTRAST MRA HEAD WITHOUT CONTRAST MRA NECK WITHOUT AND WITH CONTRAST TECHNIQUE: Multiplanar, multiecho pulse sequences of the brain and surrounding structures were obtained without intravenous contrast. Angiographic images of the Circle of Willis were obtained using MRA technique without intravenous contrast. Angiographic images of the neck were obtained using MRA technique without and with intravenous contrast. Carotid stenosis measurements (when applicable) are obtained utilizing NASCET criteria, using the distal internal carotid diameter as the denominator. CONTRAST:  77mL GADAVIST GADOBUTROL 1 MMOL/ML IV SOLN  COMPARISON:  Prior CT from earlier the same day. FINDINGS: MRI HEAD FINDINGS Brain: Cerebral volume within normal limits for patient age. No focal parenchymal signal abnormality identified. No abnormal foci of restricted diffusion to suggest acute or subacute ischemia. Gray-white matter differentiation well maintained. No encephalomalacia to suggest chronic infarction. No foci of susceptibility artifact to suggest acute or chronic intracranial hemorrhage. No mass lesion, midline shift or mass effect. No hydrocephalus. No extra-axial fluid collection. Major dural sinuses are grossly patent. Pituitary gland and suprasellar region are normal. Midline structures intact and normal. Vascular: Major intracranial vascular flow voids well maintained and normal in appearance. Skull and upper cervical spine: Craniocervical junction normal. Visualized upper cervical spine within normal limits. Bone marrow signal intensity normal. No scalp soft tissue abnormality. Sinuses/Orbits: Globes and orbital soft tissues within normal limits. Mild scattered mucosal thickening noted within the ethmoidal air cells. Paranasal sinuses are otherwise clear. No mastoid effusion. Inner ear structures normal. Other: None. MRA HEAD FINDINGS ANTERIOR CIRCULATION: Distal cervical segments of the internal carotid arteries are widely patent with symmetric antegrade flow. Petrous, cavernous, and supraclinoid ICAs widely patent without stenosis or other abnormality. A1 segments widely patent. Normal anterior communicating artery complex. Anterior cerebral arteries widely patent to their distal aspects without stenosis. No M1 stenosis or occlusion. Normal MCA bifurcations. Distal MCA branches well perfused and symmetric. POSTERIOR CIRCULATION: Vertebral arteries widely patent to the vertebrobasilar junction without stenosis. Both picas patent. Basilar widely patent to its distal aspect without stenosis. Superior cerebral arteries patent bilaterally. Both  PCAs primarily supplied via the basilar well perfused or distal aspects. No intracranial aneurysm. MRA NECK FINDINGS AORTIC ARCH: Visualized aortic arch of normal caliber with normal branch pattern. No hemodynamically significant stenosis seen about the origin of the great vessels. RIGHT CAROTID SYSTEM: Right common and internal carotid arteries widely patent without stenosis, dissection or occlusion. LEFT CAROTID SYSTEM: Left common and internal carotid arteries widely patent without stenosis, dissection or  occlusion. VERTEBRAL ARTERIES: Both vertebral arteries arise from the subclavian arteries. No proximal subclavian artery stenosis. Vertebral arteries largely codominant and patent within the neck without significant stenosis, dissection or occlusion. Single short-segment mild stenosis noted at the proximal left vertebral artery (series 107, image 10). IMPRESSION: 1. Normal brain MRI. No acute intracranial abnormality identified. 2. Normal intracranial MRA. 3. Normal MRA of the neck. Electronically Signed   By: Jeannine Boga M.D.   On: 08/21/2020 22:58   MR Angiogram Neck W or Wo Contrast  Result Date: 08/21/2020 CLINICAL DATA:  Initial evaluation for acute right-sided weakness. EXAM: MRI HEAD WITHOUT CONTRAST MRA HEAD WITHOUT CONTRAST MRA NECK WITHOUT AND WITH CONTRAST TECHNIQUE: Multiplanar, multiecho pulse sequences of the brain and surrounding structures were obtained without intravenous contrast. Angiographic images of the Circle of Willis were obtained using MRA technique without intravenous contrast. Angiographic images of the neck were obtained using MRA technique without and with intravenous contrast. Carotid stenosis measurements (when applicable) are obtained utilizing NASCET criteria, using the distal internal carotid diameter as the denominator. CONTRAST:  32mL GADAVIST GADOBUTROL 1 MMOL/ML IV SOLN COMPARISON:  Prior CT from earlier the same day. FINDINGS: MRI HEAD FINDINGS Brain: Cerebral  volume within normal limits for patient age. No focal parenchymal signal abnormality identified. No abnormal foci of restricted diffusion to suggest acute or subacute ischemia. Gray-white matter differentiation well maintained. No encephalomalacia to suggest chronic infarction. No foci of susceptibility artifact to suggest acute or chronic intracranial hemorrhage. No mass lesion, midline shift or mass effect. No hydrocephalus. No extra-axial fluid collection. Major dural sinuses are grossly patent. Pituitary gland and suprasellar region are normal. Midline structures intact and normal. Vascular: Major intracranial vascular flow voids well maintained and normal in appearance. Skull and upper cervical spine: Craniocervical junction normal. Visualized upper cervical spine within normal limits. Bone marrow signal intensity normal. No scalp soft tissue abnormality. Sinuses/Orbits: Globes and orbital soft tissues within normal limits. Mild scattered mucosal thickening noted within the ethmoidal air cells. Paranasal sinuses are otherwise clear. No mastoid effusion. Inner ear structures normal. Other: None. MRA HEAD FINDINGS ANTERIOR CIRCULATION: Distal cervical segments of the internal carotid arteries are widely patent with symmetric antegrade flow. Petrous, cavernous, and supraclinoid ICAs widely patent without stenosis or other abnormality. A1 segments widely patent. Normal anterior communicating artery complex. Anterior cerebral arteries widely patent to their distal aspects without stenosis. No M1 stenosis or occlusion. Normal MCA bifurcations. Distal MCA branches well perfused and symmetric. POSTERIOR CIRCULATION: Vertebral arteries widely patent to the vertebrobasilar junction without stenosis. Both picas patent. Basilar widely patent to its distal aspect without stenosis. Superior cerebral arteries patent bilaterally. Both PCAs primarily supplied via the basilar well perfused or distal aspects. No intracranial  aneurysm. MRA NECK FINDINGS AORTIC ARCH: Visualized aortic arch of normal caliber with normal branch pattern. No hemodynamically significant stenosis seen about the origin of the great vessels. RIGHT CAROTID SYSTEM: Right common and internal carotid arteries widely patent without stenosis, dissection or occlusion. LEFT CAROTID SYSTEM: Left common and internal carotid arteries widely patent without stenosis, dissection or occlusion. VERTEBRAL ARTERIES: Both vertebral arteries arise from the subclavian arteries. No proximal subclavian artery stenosis. Vertebral arteries largely codominant and patent within the neck without significant stenosis, dissection or occlusion. Single short-segment mild stenosis noted at the proximal left vertebral artery (series 107, image 10). IMPRESSION: 1. Normal brain MRI. No acute intracranial abnormality identified. 2. Normal intracranial MRA. 3. Normal MRA of the neck. Electronically Signed   By: Marland Kitchen  Jeannine Boga M.D.   On: 08/21/2020 22:58   MR BRAIN WO CONTRAST  Result Date: 08/21/2020 CLINICAL DATA:  Initial evaluation for acute right-sided weakness. EXAM: MRI HEAD WITHOUT CONTRAST MRA HEAD WITHOUT CONTRAST MRA NECK WITHOUT AND WITH CONTRAST TECHNIQUE: Multiplanar, multiecho pulse sequences of the brain and surrounding structures were obtained without intravenous contrast. Angiographic images of the Circle of Willis were obtained using MRA technique without intravenous contrast. Angiographic images of the neck were obtained using MRA technique without and with intravenous contrast. Carotid stenosis measurements (when applicable) are obtained utilizing NASCET criteria, using the distal internal carotid diameter as the denominator. CONTRAST:  3mL GADAVIST GADOBUTROL 1 MMOL/ML IV SOLN COMPARISON:  Prior CT from earlier the same day. FINDINGS: MRI HEAD FINDINGS Brain: Cerebral volume within normal limits for patient age. No focal parenchymal signal abnormality identified. No  abnormal foci of restricted diffusion to suggest acute or subacute ischemia. Gray-white matter differentiation well maintained. No encephalomalacia to suggest chronic infarction. No foci of susceptibility artifact to suggest acute or chronic intracranial hemorrhage. No mass lesion, midline shift or mass effect. No hydrocephalus. No extra-axial fluid collection. Major dural sinuses are grossly patent. Pituitary gland and suprasellar region are normal. Midline structures intact and normal. Vascular: Major intracranial vascular flow voids well maintained and normal in appearance. Skull and upper cervical spine: Craniocervical junction normal. Visualized upper cervical spine within normal limits. Bone marrow signal intensity normal. No scalp soft tissue abnormality. Sinuses/Orbits: Globes and orbital soft tissues within normal limits. Mild scattered mucosal thickening noted within the ethmoidal air cells. Paranasal sinuses are otherwise clear. No mastoid effusion. Inner ear structures normal. Other: None. MRA HEAD FINDINGS ANTERIOR CIRCULATION: Distal cervical segments of the internal carotid arteries are widely patent with symmetric antegrade flow. Petrous, cavernous, and supraclinoid ICAs widely patent without stenosis or other abnormality. A1 segments widely patent. Normal anterior communicating artery complex. Anterior cerebral arteries widely patent to their distal aspects without stenosis. No M1 stenosis or occlusion. Normal MCA bifurcations. Distal MCA branches well perfused and symmetric. POSTERIOR CIRCULATION: Vertebral arteries widely patent to the vertebrobasilar junction without stenosis. Both picas patent. Basilar widely patent to its distal aspect without stenosis. Superior cerebral arteries patent bilaterally. Both PCAs primarily supplied via the basilar well perfused or distal aspects. No intracranial aneurysm. MRA NECK FINDINGS AORTIC ARCH: Visualized aortic arch of normal caliber with normal branch  pattern. No hemodynamically significant stenosis seen about the origin of the great vessels. RIGHT CAROTID SYSTEM: Right common and internal carotid arteries widely patent without stenosis, dissection or occlusion. LEFT CAROTID SYSTEM: Left common and internal carotid arteries widely patent without stenosis, dissection or occlusion. VERTEBRAL ARTERIES: Both vertebral arteries arise from the subclavian arteries. No proximal subclavian artery stenosis. Vertebral arteries largely codominant and patent within the neck without significant stenosis, dissection or occlusion. Single short-segment mild stenosis noted at the proximal left vertebral artery (series 107, image 10). IMPRESSION: 1. Normal brain MRI. No acute intracranial abnormality identified. 2. Normal intracranial MRA. 3. Normal MRA of the neck. Electronically Signed   By: Jeannine Boga M.D.   On: 08/21/2020 22:58   CT HEAD CODE STROKE WO CONTRAST  Result Date: 08/21/2020 CLINICAL DATA:  Code stroke.  Right-sided weakness EXAM: CT HEAD WITHOUT CONTRAST TECHNIQUE: Contiguous axial images were obtained from the base of the skull through the vertex without intravenous contrast. COMPARISON:  2018 FINDINGS: Brain: There is no acute intracranial hemorrhage, mass effect, or edema. Gray-white differentiation remains preserved. Ventricles and sulci are normal in size  and configuration. There is no extra-axial fluid collection. Vascular: No hyperdense vessel. Skull: Unremarkable. Sinuses/Orbits: No acute or significant abnormality. Other: Mastoid air cells are clear. ASPECTS ([place] Stroke Program Early CT Score) - Ganglionic level infarction (caudate, lentiform nuclei, internal capsule, insula, M1-M3 cortex): 7 - Supraganglionic infarction (M4-M6 cortex): 3 Total score (0-10 with 10 being normal): 10 IMPRESSION: No acute intracranial hemorrhage or evidence of acute infarction. ASPECT score is 10. These results were called by telephone at the time of  interpretation on 08/21/2020 at 5:31 pm to provider [facility] , who verbally acknowledged these results. Electronically Signed   By: Macy Mis M.D.   On: 08/21/2020 17:32    Procedures Procedures  Medications Ordered in the ED Medications  sodium chloride flush (NS) 0.9 % injection 3 mL (0 mLs Intravenous Hold 08/21/20 1759)  sodium chloride flush (NS) 0.9 % injection 10-40 mL (0 mLs Intracatheter Hold 08/21/20 2108)  sodium chloride flush (NS) 0.9 % injection 10-40 mL (has no administration in time range)  dexamethasone (DECADRON) injection 10 mg (10 mg Intravenous Given 08/21/20 2059)  morphine 4 MG/ML injection 4 mg (4 mg Intravenous Given 08/21/20 2058)  gadobutrol (GADAVIST) 1 MMOL/ML injection 7 mL (7 mLs Intravenous Contrast Given 08/21/20 2204)     MDM Rules/Calculators/A&P MDM Code Stroke called on patient's initial evaluation based on her symptoms, although complex migraine and lupus flare are also in the differential. Patient taken emergently to CT.  ED Course  I have reviewed the triage vital signs and the nursing notes.  Pertinent labs & imaging results that were available during my care of the patient were reviewed by me and considered in my medical decision making (see chart for details).  Clinical Course as of Aug 21 2320  Wed Aug 21, 2020  1732 Per Radiology, CT neg for hemorrhage. No signs of large stroke. Patient has anaphylactic reaction to contrast dye, so will have to pre-treat or do MR for angiography. Awaiting call back from Teleneurology.    [CS]  8676 Spoke with Dr. Vickki Muff, Teleneurology, who has assessed the patient. She reports that the patient has been offered tPA but declined. She recommends treatment for headache (patient has allergies to NSAIDs and reglan, will use steroids and opiates). Patient not given ASA due to allergy. Will check MRI/MRA to formally rule out stroke.  Dr. Vickki Muff also mentioned that if the patient changes her mind regarding tPA while  still in the treatment window, we can reconsult her then.   [CS]  7209 CBC with mild anemia, otherwise normal.    [CS]  1842 CMP with mild hyperglycemia, otherwise normal.    [CS]  2228 Patient reports improvement in headache. Feels like her neurologic symptoms are improving. She is able to stand and bear weight now, walking to bathroom with some assistance.    [CS]  2308 MRI/MRA negative. Patient continues to report improvement. She would like to go home. Feels comfortable with taking steroids there. Family at bedside to help. Advised PCP followup, RTED if worsening.    [CS]    Clinical Course User Index [CS] Truddie Hidden, MD    Final Clinical Impression(s) / ED Diagnoses Final diagnoses:  Hemiplegic migraine without status migrainosus, not intractable    Rx / DC Orders ED Discharge Orders         Ordered    predniSONE (STERAPRED UNI-PAK 21 TAB) 10 MG (21) TBPK tablet        08/21/20 2321  Truddie Hidden, MD 08/21/20 [phone]

## 2020-08-21 NOTE — ED Notes (Signed)
Pt to CT

## 2020-08-21 NOTE — ED Notes (Signed)
Attempted to stick patient x2 w/ [place] machine w/o success, blood work sent to lab. IV team consult placed, IV team paged.

## 2020-08-21 NOTE — ED Notes (Signed)
Unsuccessful IV attempt. IV team consult already ordered.

## 2020-08-21 NOTE — ED Notes (Signed)
Pt ambulated to restroom and states right arm and leg are feeling better.

## 2020-08-21 NOTE — Consult Note (Signed)
[facility] Tele[facility]urology Consult Services   Date of Service:   08/21/2020 17:55:33  Impression:     .  R51.9 - Headache, unspecified     .  R53.1 - Weakness  Comments/Sign-Out: Pt with a headache and right sided weakness, mild decreased sensation. Pt relates this is similar to prior lupus flares which improved after headache treatment. IV alteplase offered as ddx includes stroke, however she declined. ER relayed that MRI is available tonight. Recommend MRI brain w/o and MRA head and neck for further evaluation. If symptoms improve and MRI neg acute process ok to dc pt from ER. However, if symptoms persist or worsen, admission is recommended with full stroke workup. Explained to pt that if she changes her mind and would like to proceed with IV alteplase that is available until 2115. Please reconsult TS if she would like reassessment. ASA 325 mg x 1 now is recommended.  Metrics: Last Known Well: 08/21/2020 16:45:00 [facility] Notification Time: 08/21/2020 17:55:33 Arrival Time: 08/21/2020 17:05:00 Stamp Time: 08/21/2020 17:55:33 Time First Login Attempt: 08/21/2020 18:01:01 Symptoms: right sided weakness and numbness. NIHSS Start Assessment Time: 08/21/2020 18:06:05 Patient is not a candidate for Thrombolytic. Thrombolytic Medical Decision: 08/21/2020 18:17:57 Patient was not deemed candidate for Thrombolytic because of following reasons: Other Diagnosis suspected. Pt declined. .  CT head showed no acute hemorrhage or acute core infarct. CT head was reviewed.  ED Physician notified of diagnostic impression and management plan on 08/21/2020 18:27:43  Advanced Imaging: Advanced Imaging Not Recommended because:  Clinical Presentation is not Suggestive of LVO and NIHSS is <6   Our recommendations are outlined below.  Recommendations:          .  Neuro Checks     .  Bedside Swallow Eval     .  Euglycemia and Avoid Hyperthermia (PRN Acetaminophen)     .   Initiate Aspirin 325 MG Daily     .   Recommend MRI brain w/o and MRA head and neck for further evaluation.     .  If symptoms improve and MRI neg acute process ok to dc pt from ER. However, if symptoms persist or worsen, admission is recommended with full stroke workup.     .  Explained to pt that if she changes her mind and would like to proceed with IV alteplase that is available until 2115. Please reconsult TS if she would like reassessment.     .  ASA 325 mg x 1 now is recommended.  Routine Consultation with Inhouse Neurology for Follow up Care  Sign Out:     .  Discussed with Emergency Department Provider    ------------------------------------------------------------------------------  History of Present Illness: Patient is a 49 year old Female.  Patient was brought by private transportation with symptoms of right sided weakness and numbness.  Pt with a h/o seizures, migraines, lupus today developed a headache with right sided weakness and right sided numbness. She has had a headache for two weeks and developed the weakness/numbness today at 1645. The patient relates that she has had similar spells in the past which were related to lupus flares or pseudotumor cerebri. She relates that treatment of her headaches resulted in improvement in the weakness and numbness previously. She has not had a seizure in many years.   Past Medical History:     . There is NO history of Hypertension     . There is NO history of Diabetes Mellitus     . There is NO  history of Hyperlipidemia     . There is NO history of Atrial Fibrillation     . There is NO history of Coronary Artery Disease     . There is NO history of Stroke  Anticoagulant use:  No  Antiplatelet use: No    Examination: BP(149/85), Pulse(93), Blood Glucose(201) 1A: Level of Consciousness - Alert; keenly responsive + 0 1B: Ask Month and Age - Both Questions Right + 0 1C: Blink Eyes & Squeeze Hands - Performs Both Tasks +  0 2: Test Horizontal Extraocular Movements - Normal + 0 3: Test Visual Fields - No Visual Loss + 0 4: Test Facial Palsy (Use Grimace if Obtunded) - Minor paralysis (flat nasolabial fold, smile asymmetry) + 1 5A: Test Left Arm Motor Drift - No Drift for 10 Seconds + 0 5B: Test Right Arm Motor Drift - Drift, but doesn't hit bed + 1 6A: Test Left Leg Motor Drift - No Drift for 5 Seconds + 0 6B: Test Right Leg Motor Drift - Drift, hits bed + 2 7: Test Limb Ataxia (FNF/Heel-Shin) - No Ataxia + 0 8: Test Sensation - Mild-Moderate Loss: Less Sharp/More Dull + 1 9: Test Language/Aphasia - Normal; No aphasia + 0 10: Test Dysarthria - Normal + 0 11: Test Extinction/Inattention - No abnormality + 0  NIHSS Score: 5  Pre-Morbid Modified Rankin Scale: 0 Points = No symptoms at all   Patient/Family was informed the Neurology Consult would occur via TeleHealth consult by way of interactive audio and video telecommunications and consented to receiving care in this manner.   Patient is being evaluated for possible acute neurologic impairment and high probability of imminent or life-threatening deterioration. I spent total of 28 minutes providing care to this patient, including time for face to face visit via telemedicine, review of medical records, imaging studies and discussion of findings with providers, the patient and/or family.   Dr Rutherford Guys   [facility] [phone]  Case [id number]

## 2020-08-21 NOTE — ED Triage Notes (Signed)
Pt states at approximately 1645, she started having right sided weakness. Pt has also had loss of sensation on the right side. Pt states she was working at [employer] when this started.  Pt describes face feeling numb on left side.  Pt A&Ox4. Pt c/o headache.  Pt has had similar events occur as a lupus flair up.

## 2021-03-06 ENCOUNTER — Emergency Department (HOSPITAL_COMMUNITY)
Admission: EM | Admit: 2021-03-06 | Discharge: 2021-03-06 | Disposition: A | Discharge status: Home / Self Care | Payer: Self-pay | Attending: Emergency Medicine | Admitting: Emergency Medicine

## 2021-03-06 ENCOUNTER — Encounter (HOSPITAL_COMMUNITY): Payer: Self-pay

## 2021-03-06 ENCOUNTER — Other Ambulatory Visit: Payer: Self-pay

## 2021-03-06 DIAGNOSIS — R102 Pelvic and perineal pain: Secondary | ICD-10-CM | POA: Insufficient documentation

## 2021-03-06 DIAGNOSIS — R300 Dysuria: Secondary | ICD-10-CM | POA: Insufficient documentation

## 2021-03-06 DIAGNOSIS — Z8673 Personal history of transient ischemic attack (TIA), and cerebral infarction without residual deficits: Secondary | ICD-10-CM | POA: Insufficient documentation

## 2021-03-06 DIAGNOSIS — N898 Other specified noninflammatory disorders of vagina: Secondary | ICD-10-CM | POA: Insufficient documentation

## 2021-03-06 DIAGNOSIS — Z86718 Personal history of other venous thrombosis and embolism: Secondary | ICD-10-CM | POA: Insufficient documentation

## 2021-03-06 DIAGNOSIS — R21 Rash and other nonspecific skin eruption: Secondary | ICD-10-CM | POA: Insufficient documentation

## 2021-03-06 DIAGNOSIS — R739 Hyperglycemia, unspecified: Secondary | ICD-10-CM | POA: Insufficient documentation

## 2021-03-06 LAB — WET PREP, GENITAL
Clue Cells Wet Prep HPF POC: NONE SEEN
Sperm: NONE SEEN
Trich, Wet Prep: NONE SEEN
Yeast Wet Prep HPF POC: NONE SEEN

## 2021-03-06 LAB — CBC WITH DIFFERENTIAL/PLATELET
Abs Immature Granulocytes: 0.03 10*3/uL (ref 0.00–0.07)
Basophils Absolute: 0.1 10*3/uL (ref 0.0–0.1)
Basophils Relative: 1 %
Eosinophils Absolute: 0.1 10*3/uL (ref 0.0–0.5)
Eosinophils Relative: 1 %
HCT: 38.8 % (ref 36.0–46.0)
Hemoglobin: 12.2 g/dL (ref 12.0–15.0)
Immature Granulocytes: 0 %
Lymphocytes Relative: 38 %
Lymphs Abs: 3.2 10*3/uL (ref 0.7–4.0)
MCH: 26.1 pg (ref 26.0–34.0)
MCHC: 31.4 g/dL (ref 30.0–36.0)
MCV: 83.1 fL (ref 80.0–100.0)
Monocytes Absolute: 0.5 10*3/uL (ref 0.1–1.0)
Monocytes Relative: 6 %
Neutro Abs: 4.4 10*3/uL (ref 1.7–7.7)
Neutrophils Relative %: 54 %
Platelets: 383 10*3/uL (ref 150–400)
RBC: 4.67 MIL/uL (ref 3.87–5.11)
RDW: 12.6 % (ref 11.5–15.5)
WBC: 8.3 10*3/uL (ref 4.0–10.5)
nRBC: 0 % (ref 0.0–0.2)

## 2021-03-06 LAB — URINALYSIS, ROUTINE W REFLEX MICROSCOPIC
Bilirubin Urine: NEGATIVE
Glucose, UA: >=500 mg/dL — AB
Ketones, ur: NEGATIVE mg/dL
Leukocytes,Ua: NEGATIVE
Nitrite: NEGATIVE
Protein, ur: NEGATIVE mg/dL
Specific Gravity, Urine: 1.031 — ABNORMAL HIGH (ref 1.005–1.030)
pH: 5 (ref 5.0–8.0)

## 2021-03-06 LAB — BASIC METABOLIC PANEL
Anion gap: 9 (ref 5–15)
BUN: 15 mg/dL (ref 6–20)
CO2: 23 mmol/L (ref 22–32)
Calcium: 9.9 mg/dL (ref 8.9–10.3)
Chloride: 101 mmol/L (ref 98–111)
Creatinine, Ser: 0.7 mg/dL (ref 0.44–1.00)
GFR, Estimated: >60 mL/min (ref >60)
Glucose, Bld: 272 mg/dL — ABNORMAL HIGH (ref 70–99)
Potassium: 4.1 mmol/L (ref 3.5–5.1)
Sodium: 133 mmol/L — ABNORMAL LOW (ref 135–145)

## 2021-03-06 LAB — RAPID HIV SCREEN (HIV 1/2 AB+AG)
HIV 1/2 Antibodies: NONREACTIVE
HIV-1 P24 Antigen - HIV24: NONREACTIVE

## 2021-03-06 MED ORDER — ONDANSETRON HCL 4 MG/2ML IJ SOLN
4.0000 mg | Freq: Once | INTRAMUSCULAR | Status: AC
Start: 2021-03-06 — End: 2021-03-06
  Administered 2021-03-06: 4 mg via INTRAVENOUS
  Filled 2021-03-06: qty 2

## 2021-03-06 MED ORDER — OXYCODONE-ACETAMINOPHEN 5-325 MG PO TABS
1.0000 | ORAL_TABLET | Freq: Once | ORAL | Status: AC
  Administered 2021-03-06: 1 via ORAL
  Filled 2021-03-06: qty 1

## 2021-03-06 MED ORDER — LIDOCAINE 5 % EX OINT: 1.0000 "application " | TOPICAL_OINTMENT | CUTANEOUS | 0 refills | Status: DC | PRN

## 2021-03-06 MED ORDER — FENTANYL CITRATE (PF) 100 MCG/2ML IJ SOLN
25.0000 ug | Freq: Once | INTRAMUSCULAR | Status: AC
Start: 2021-03-06 — End: 2021-03-06
  Administered 2021-03-06: 25 ug via INTRAVENOUS
  Filled 2021-03-06: qty 2

## 2021-03-06 MED ORDER — CEPHALEXIN 500 MG PO CAPS: 500.0000 mg | ORAL_CAPSULE | Freq: Two times a day (BID) | ORAL | 0 refills | Status: AC

## 2021-03-06 NOTE — ED Triage Notes (Signed)
Patient c/o vaginal pain and a rash since 02/12/21. Patient states she thought she had a yeast infection and has used OTC meds. Patient also c/o dysuria x 3 days.

## 2021-03-06 NOTE — ED Notes (Signed)
Pt verbalized understanding of d/c, medication, and follow up care. Ambulatory with steady gait.  
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
## 2021-03-06 NOTE — Clinical Social Work Note (Signed)
TOC consulted for PCP needs. CSW attempted call to pt with no answer. CSW left VM for pt explaining that the [facility] would be added to the AVS. CSW left detailed instructions about letting them know she was seen in the ED and referred to them by the ED. CSW confirmed this information was also added to the AVS for the pt along with the instructions.

## 2021-03-06 NOTE — Discharge Instructions (Signed)
Your blood sugar was high today.  You also had some glucose in your urine. You will need to follow up with a primary care provider regarding this as you may have diabetes.  This is typically not a diagnosis that we make in the ER. I will start you on antibiotics to help with this possible infection. Use lidocaine to help with discomfort. Return to the ER if you start to experience worsening pain, fever, chest pain or shortness of breath

## 2021-03-07 LAB — GC/CHLAMYDIA PROBE AMP (~~LOC~~) NOT AT ARMC
Chlamydia: NEGATIVE
Comment: NEGATIVE
Comment: NORMAL
Neisseria Gonorrhea: NEGATIVE

## 2021-03-07 LAB — RPR: RPR Ser Ql: NONREACTIVE

## 2021-03-20 ENCOUNTER — Other Ambulatory Visit: Payer: Self-pay

## 2021-03-20 ENCOUNTER — Encounter (HOSPITAL_COMMUNITY): Payer: Self-pay

## 2021-03-20 ENCOUNTER — Emergency Department (HOSPITAL_COMMUNITY): Payer: BLUE CROSS/BLUE SHIELD

## 2021-03-20 ENCOUNTER — Emergency Department (HOSPITAL_COMMUNITY)
Admission: EM | Admit: 2021-03-20 | Discharge: 2021-03-20 | Disposition: A | Discharge status: Home / Self Care | Payer: BLUE CROSS/BLUE SHIELD | Attending: Emergency Medicine | Admitting: Emergency Medicine

## 2021-03-20 DIAGNOSIS — R21 Rash and other nonspecific skin eruption: Secondary | ICD-10-CM | POA: Diagnosis present

## 2021-03-20 DIAGNOSIS — R509 Fever, unspecified: Secondary | ICD-10-CM | POA: Diagnosis not present

## 2021-03-20 DIAGNOSIS — R102 Pelvic and perineal pain: Secondary | ICD-10-CM | POA: Diagnosis not present

## 2021-03-20 DIAGNOSIS — R112 Nausea with vomiting, unspecified: Secondary | ICD-10-CM | POA: Insufficient documentation

## 2021-03-20 DIAGNOSIS — R1032 Left lower quadrant pain: Secondary | ICD-10-CM | POA: Diagnosis not present

## 2021-03-20 LAB — CBC WITH DIFFERENTIAL/PLATELET
Abs Immature Granulocytes: 0.01 10*3/uL (ref 0.00–0.07)
Basophils Absolute: 0 10*3/uL (ref 0.0–0.1)
Basophils Relative: 1 %
Eosinophils Absolute: 0.1 10*3/uL (ref 0.0–0.5)
Eosinophils Relative: 1 %
HCT: 40.2 % (ref 36.0–46.0)
Hemoglobin: 12.8 g/dL (ref 12.0–15.0)
Immature Granulocytes: 0 %
Lymphocytes Relative: 40 %
Lymphs Abs: 2.8 10*3/uL (ref 0.7–4.0)
MCH: 27 pg (ref 26.0–34.0)
MCHC: 31.8 g/dL (ref 30.0–36.0)
MCV: 84.8 fL (ref 80.0–100.0)
Monocytes Absolute: 0.5 10*3/uL (ref 0.1–1.0)
Monocytes Relative: 7 %
Neutro Abs: 3.6 10*3/uL (ref 1.7–7.7)
Neutrophils Relative %: 51 %
Platelets: 322 10*3/uL (ref 150–400)
RBC: 4.74 MIL/uL (ref 3.87–5.11)
RDW: 12.7 % (ref 11.5–15.5)
WBC: 7 10*3/uL (ref 4.0–10.5)
nRBC: 0 % (ref 0.0–0.2)

## 2021-03-20 LAB — COMPREHENSIVE METABOLIC PANEL
ALT: 19 U/L (ref 0–44)
AST: 19 U/L (ref 15–41)
Albumin: 4.8 g/dL (ref 3.5–5.0)
Alkaline Phosphatase: 114 U/L (ref 38–126)
Anion gap: 9 (ref 5–15)
BUN: 11 mg/dL (ref 6–20)
CO2: 25 mmol/L (ref 22–32)
Calcium: 9.7 mg/dL (ref 8.9–10.3)
Chloride: 102 mmol/L (ref 98–111)
Creatinine, Ser: 0.62 mg/dL (ref 0.44–1.00)
GFR, Estimated: >60 mL/min (ref >60)
Glucose, Bld: 217 mg/dL — ABNORMAL HIGH (ref 70–99)
Potassium: 3.5 mmol/L (ref 3.5–5.1)
Sodium: 136 mmol/L (ref 135–145)
Total Bilirubin: 0.5 mg/dL (ref 0.3–1.2)
Total Protein: 8.5 g/dL — ABNORMAL HIGH (ref 6.5–8.1)

## 2021-03-20 LAB — LIPASE, BLOOD: Lipase: 32 U/L (ref 11–51)

## 2021-03-20 MED ORDER — ONDANSETRON HCL 4 MG/2ML IJ SOLN
4.0000 mg | Freq: Once | INTRAMUSCULAR | Status: AC
  Administered 2021-03-20: 4 mg via INTRAVENOUS
  Filled 2021-03-20: qty 2

## 2021-03-20 MED ORDER — SODIUM CHLORIDE 0.9 % IV BOLUS
1000.0000 mL | Freq: Once | INTRAVENOUS | Status: AC
  Administered 2021-03-20: 1000 mL via INTRAVENOUS

## 2021-03-20 MED ORDER — OXYCODONE-ACETAMINOPHEN 5-325 MG PO TABS
2.0000 | ORAL_TABLET | ORAL | 0 refills | Status: AC | PRN
Start: 2021-03-20 — End: 2021-03-23

## 2021-03-20 MED ORDER — ACYCLOVIR 400 MG PO TABS: 400.0000 mg | ORAL_TABLET | Freq: Three times a day (TID) | ORAL | 0 refills | Status: AC

## 2021-03-20 MED ORDER — MORPHINE SULFATE (PF) 4 MG/ML IV SOLN
4.0000 mg | Freq: Once | INTRAVENOUS | Status: AC
  Administered 2021-03-20: 4 mg via INTRAVENOUS
  Filled 2021-03-20: qty 1

## 2021-03-20 NOTE — Discharge Instructions (Addendum)
I have prescribed medication to help with your likely herpes rash, please take 1 tablet 3 times a day for the next 10 days.  Your laboratory results were within normal limits today.  The number to the [facility] health and wellness clinic is attached to your chart, please do an appointment in order to obtain primary care.
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
## 2021-03-20 NOTE — ED Triage Notes (Signed)
Patient was seen on 03/06/21 for a vaginal rash. Patient states she has taken all of the antibiotics and creams that were prescribed, but the rash and vaginal pain are worse.

## 2021-03-20 NOTE — ED Notes (Signed)
PA didn't want to do pelvic because it would cause too much pain to pt and those tests have been done previously

## 2021-07-10 ENCOUNTER — Encounter (HOSPITAL_COMMUNITY): Payer: Self-pay

## 2021-07-10 ENCOUNTER — Emergency Department (HOSPITAL_BASED_OUTPATIENT_CLINIC_OR_DEPARTMENT_OTHER)
Admit: 2021-07-10 | Discharge: 2021-07-10 | Disposition: A | Discharge status: Home / Self Care | Payer: BLUE CROSS/BLUE SHIELD | Attending: Emergency Medicine | Admitting: Emergency Medicine

## 2021-07-10 ENCOUNTER — Emergency Department (HOSPITAL_COMMUNITY): Payer: BLUE CROSS/BLUE SHIELD

## 2021-07-10 ENCOUNTER — Other Ambulatory Visit: Payer: Self-pay

## 2021-07-10 ENCOUNTER — Emergency Department (HOSPITAL_COMMUNITY)
Admission: EM | Admit: 2021-07-10 | Discharge: 2021-07-10 | Disposition: A | Discharge status: Home / Self Care | Payer: BLUE CROSS/BLUE SHIELD | Attending: Emergency Medicine | Admitting: Emergency Medicine

## 2021-07-10 DIAGNOSIS — M79651 Pain in right thigh: Secondary | ICD-10-CM | POA: Insufficient documentation

## 2021-07-10 DIAGNOSIS — M25551 Pain in right hip: Secondary | ICD-10-CM | POA: Diagnosis not present

## 2021-07-10 DIAGNOSIS — M5431 Sciatica, right side: Secondary | ICD-10-CM

## 2021-07-10 DIAGNOSIS — M545 Low back pain, unspecified: Secondary | ICD-10-CM | POA: Diagnosis present

## 2021-07-10 DIAGNOSIS — M5441 Lumbago with sciatica, right side: Secondary | ICD-10-CM | POA: Diagnosis not present

## 2021-07-10 DIAGNOSIS — M79604 Pain in right leg: Secondary | ICD-10-CM | POA: Diagnosis not present

## 2021-07-10 LAB — CBC WITH DIFFERENTIAL/PLATELET
Abs Immature Granulocytes: 0.02 10*3/uL (ref 0.00–0.07)
Basophils Absolute: 0 10*3/uL (ref 0.0–0.1)
Basophils Relative: 1 %
Eosinophils Absolute: 0.1 10*3/uL (ref 0.0–0.5)
Eosinophils Relative: 1 %
HCT: 44.1 % (ref 36.0–46.0)
Hemoglobin: 13.6 g/dL (ref 12.0–15.0)
Immature Granulocytes: 0 %
Lymphocytes Relative: 37 %
Lymphs Abs: 2.2 10*3/uL (ref 0.7–4.0)
MCH: 26.3 pg (ref 26.0–34.0)
MCHC: 30.8 g/dL (ref 30.0–36.0)
MCV: 85.3 fL (ref 80.0–100.0)
Monocytes Absolute: 0.3 10*3/uL (ref 0.1–1.0)
Monocytes Relative: 6 %
Neutro Abs: 3.2 10*3/uL (ref 1.7–7.7)
Neutrophils Relative %: 55 %
Platelets: 264 10*3/uL (ref 150–400)
RBC: 5.17 MIL/uL — ABNORMAL HIGH (ref 3.87–5.11)
RDW: 13.2 % (ref 11.5–15.5)
WBC: 5.9 10*3/uL (ref 4.0–10.5)
nRBC: 0 % (ref 0.0–0.2)

## 2021-07-10 LAB — COMPREHENSIVE METABOLIC PANEL
ALT: 26 U/L (ref 0–44)
AST: 21 U/L (ref 15–41)
Albumin: 4.9 g/dL (ref 3.5–5.0)
Alkaline Phosphatase: 113 U/L (ref 38–126)
Anion gap: 9 (ref 5–15)
BUN: 14 mg/dL (ref 6–20)
CO2: 24 mmol/L (ref 22–32)
Calcium: 9.9 mg/dL (ref 8.9–10.3)
Chloride: 104 mmol/L (ref 98–111)
Creatinine, Ser: 0.74 mg/dL (ref 0.44–1.00)
GFR, Estimated: >60 mL/min (ref >60)
Glucose, Bld: 141 mg/dL — ABNORMAL HIGH (ref 70–99)
Potassium: 4.2 mmol/L (ref 3.5–5.1)
Sodium: 137 mmol/L (ref 135–145)
Total Bilirubin: 0.5 mg/dL (ref 0.3–1.2)
Total Protein: 8.7 g/dL — ABNORMAL HIGH (ref 6.5–8.1)

## 2021-07-10 LAB — CK: Total CK: 52 U/L (ref 38–234)

## 2021-07-10 MED ORDER — HYDROCODONE-ACETAMINOPHEN 5-325 MG PO TABS
2.0000 | ORAL_TABLET | Freq: Once | ORAL | Status: AC
Start: 2021-07-10 — End: 2021-07-10
  Administered 2021-07-10: 2 via ORAL
  Filled 2021-07-10: qty 2

## 2021-07-10 MED ORDER — METHOCARBAMOL 500 MG PO TABS: 500.0000 mg | ORAL_TABLET | Freq: Two times a day (BID) | ORAL | 0 refills | Status: DC

## 2021-07-10 MED ORDER — HYDROCODONE-ACETAMINOPHEN 5-325 MG PO TABS: 1.0000 | ORAL_TABLET | Freq: Four times a day (QID) | ORAL | 0 refills | Status: DC | PRN
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
## 2021-07-10 NOTE — ED Provider Notes (Signed)
Emergency Medicine Provider Triage Evaluation Note  Donna [name] , a 50 y.o. female  was evaluated in triage.  Pt complains of R hip pain down into thigh for  days. States that she has been falling due to the pain. History of blood clot in upper extremity. Hs of lupus. No numbness or tingling in leg. No edema.    Review of Systems  Positive: R hip pain, leg pain Negative: fevers  Physical Exam  BP (!) 150/92 (BP Location: Left Arm)   Pulse (!) 112   Temp 97.8 F (36.6 C) (Oral)   Resp 16   Ht 5' (1.524 m)   Wt 72.6 kg   LMP 06/14/2003   SpO2 99%   BMI 31.25 kg/m  Gen:   Awake, no distress   Resp:  Normal effort  MSK:   TTP to right thigh, compartments are soft, difficult to move due ot pain Other:  DP 2+  Medical Decision Making  Medically screening exam initiated at 5:44 PM.  Appropriate orders placed.  Sanjuan Dame Ecklund was informed that the remainder of the evaluation will be completed by another provider, this initial triage assessment does not replace that evaluation, and the importance of remaining in the ED until their evaluation is complete.     Farrel Gordon, PA-C 07/10/21 1747    Pollyann Savoy, MD 07/10/21 [phone]

## 2021-07-10 NOTE — Progress Notes (Signed)
Right lower extremity venous duplex has been completed. Preliminary results can be found in CV Proc through chart review.  Results were given to Farrel Gordon PA.  07/10/21 6:37 PM Olen Cordial RVT
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
## 2021-11-21 ENCOUNTER — Inpatient Hospital Stay (HOSPITAL_BASED_OUTPATIENT_CLINIC_OR_DEPARTMENT_OTHER)
Admission: EM | Admit: 2021-11-21 | Discharge: 2021-11-24 | DRG: 872 | Disposition: A | Discharge status: Home / Self Care | Payer: Self-pay | Attending: Internal Medicine | Admitting: Internal Medicine

## 2021-11-21 ENCOUNTER — Encounter (HOSPITAL_BASED_OUTPATIENT_CLINIC_OR_DEPARTMENT_OTHER): Payer: Self-pay | Admitting: Emergency Medicine

## 2021-11-21 ENCOUNTER — Other Ambulatory Visit: Payer: Self-pay

## 2021-11-21 DIAGNOSIS — Z8041 Family history of malignant neoplasm of ovary: Secondary | ICD-10-CM

## 2021-11-21 DIAGNOSIS — Z823 Family history of stroke: Secondary | ICD-10-CM

## 2021-11-21 DIAGNOSIS — E559 Vitamin D deficiency, unspecified: Secondary | ICD-10-CM | POA: Diagnosis present

## 2021-11-21 DIAGNOSIS — R739 Hyperglycemia, unspecified: Secondary | ICD-10-CM | POA: Diagnosis present

## 2021-11-21 DIAGNOSIS — Z91030 Bee allergy status: Secondary | ICD-10-CM

## 2021-11-21 DIAGNOSIS — Z820 Family history of epilepsy and other diseases of the nervous system: Secondary | ICD-10-CM

## 2021-11-21 DIAGNOSIS — N281 Cyst of kidney, acquired: Secondary | ICD-10-CM | POA: Diagnosis present

## 2021-11-21 DIAGNOSIS — Z8673 Personal history of transient ischemic attack (TIA), and cerebral infarction without residual deficits: Secondary | ICD-10-CM

## 2021-11-21 DIAGNOSIS — I509 Heart failure, unspecified: Secondary | ICD-10-CM

## 2021-11-21 DIAGNOSIS — Z91041 Radiographic dye allergy status: Secondary | ICD-10-CM

## 2021-11-21 DIAGNOSIS — Z800 Family history of malignant neoplasm of digestive organs: Secondary | ICD-10-CM

## 2021-11-21 DIAGNOSIS — Z20822 Contact with and (suspected) exposure to covid-19: Secondary | ICD-10-CM | POA: Diagnosis present

## 2021-11-21 DIAGNOSIS — N10 Acute tubulo-interstitial nephritis: Secondary | ICD-10-CM | POA: Diagnosis present

## 2021-11-21 DIAGNOSIS — Z888 Allergy status to other drugs, medicaments and biological substances status: Secondary | ICD-10-CM

## 2021-11-21 DIAGNOSIS — A4151 Sepsis due to Escherichia coli [E. coli]: Principal | ICD-10-CM | POA: Diagnosis present

## 2021-11-21 DIAGNOSIS — Z86718 Personal history of other venous thrombosis and embolism: Secondary | ICD-10-CM

## 2021-11-21 DIAGNOSIS — Z803 Family history of malignant neoplasm of breast: Secondary | ICD-10-CM

## 2021-11-21 DIAGNOSIS — N12 Tubulo-interstitial nephritis, not specified as acute or chronic: Secondary | ICD-10-CM | POA: Diagnosis present

## 2021-11-21 LAB — COMPREHENSIVE METABOLIC PANEL
ALT: 28 U/L (ref 0–44)
AST: 41 U/L (ref 15–41)
Albumin: 4.7 g/dL (ref 3.5–5.0)
Alkaline Phosphatase: 117 U/L (ref 38–126)
Anion gap: 13 (ref 5–15)
BUN: 13 mg/dL (ref 6–20)
CO2: 23 mmol/L (ref 22–32)
Calcium: 10.9 mg/dL — ABNORMAL HIGH (ref 8.9–10.3)
Chloride: 99 mmol/L (ref 98–111)
Creatinine, Ser: 0.87 mg/dL (ref 0.44–1.00)
GFR, Estimated: >60 mL/min (ref >60)
Glucose, Bld: 159 mg/dL — ABNORMAL HIGH (ref 70–99)
Potassium: 4.3 mmol/L (ref 3.5–5.1)
Sodium: 135 mmol/L (ref 135–145)
Total Bilirubin: 0.8 mg/dL (ref 0.3–1.2)
Total Protein: 8.6 g/dL — ABNORMAL HIGH (ref 6.5–8.1)

## 2021-11-21 LAB — URINALYSIS, ROUTINE W REFLEX MICROSCOPIC
Bilirubin Urine: NEGATIVE
Glucose, UA: 100 mg/dL — AB
Ketones, ur: 15 mg/dL — AB
Nitrite: NEGATIVE
Protein, ur: 30 mg/dL — AB
Specific Gravity, Urine: 1.018 (ref 1.005–1.030)
WBC, UA: >50 WBC/hpf — ABNORMAL HIGH (ref 0–5)
pH: 5.5 (ref 5.0–8.0)

## 2021-11-21 LAB — CBC WITH DIFFERENTIAL/PLATELET
Abs Immature Granulocytes: 0.04 10*3/uL (ref 0.00–0.07)
Basophils Absolute: 0 10*3/uL (ref 0.0–0.1)
Basophils Relative: 0 %
Eosinophils Absolute: 0 10*3/uL (ref 0.0–0.5)
Eosinophils Relative: 0 %
HCT: 36.4 % (ref 36.0–46.0)
Hemoglobin: 11.6 g/dL — ABNORMAL LOW (ref 12.0–15.0)
Immature Granulocytes: 0 %
Lymphocytes Relative: 22 %
Lymphs Abs: 2.4 10*3/uL (ref 0.7–4.0)
MCH: 26.6 pg (ref 26.0–34.0)
MCHC: 31.9 g/dL (ref 30.0–36.0)
MCV: 83.5 fL (ref 80.0–100.0)
Monocytes Absolute: 1.2 10*3/uL — ABNORMAL HIGH (ref 0.1–1.0)
Monocytes Relative: 11 %
Neutro Abs: 7.1 10*3/uL (ref 1.7–7.7)
Neutrophils Relative %: 67 %
Platelets: 326 10*3/uL (ref 150–400)
RBC: 4.36 MIL/uL (ref 3.87–5.11)
RDW: 13 % (ref 11.5–15.5)
WBC: 10.9 10*3/uL — ABNORMAL HIGH (ref 4.0–10.5)
nRBC: 0 % (ref 0.0–0.2)

## 2021-11-21 LAB — LACTIC ACID, PLASMA: Lactic Acid, Venous: 0.6 mmol/L (ref 0.5–1.9)

## 2021-11-21 MED ORDER — SODIUM CHLORIDE 0.9 % IV BOLUS
500.0000 mL | Freq: Once | INTRAVENOUS | Status: AC
  Administered 2021-11-21: 500 mL via INTRAVENOUS

## 2021-11-21 MED ORDER — ONDANSETRON HCL 4 MG/2ML IJ SOLN
4.0000 mg | Freq: Once | INTRAMUSCULAR | Status: AC
  Administered 2021-11-21: 4 mg via INTRAVENOUS
  Filled 2021-11-21: qty 2

## 2021-11-21 MED ORDER — ACETAMINOPHEN 325 MG PO TABS
650.0000 mg | ORAL_TABLET | Freq: Once | ORAL | Status: AC | PRN
  Administered 2021-11-21: 650 mg via ORAL
  Filled 2021-11-21: qty 2

## 2021-11-21 MED ORDER — FENTANYL CITRATE PF 50 MCG/ML IJ SOSY
50.0000 ug | PREFILLED_SYRINGE | Freq: Once | INTRAMUSCULAR | Status: AC
  Administered 2021-11-21: 50 ug via INTRAVENOUS
  Filled 2021-11-21: qty 1
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
## 2021-11-21 NOTE — ED Notes (Signed)
2 unsuccessful IV attempts.  Pt tolerated well. Rosalin Hawking will attempt IV.
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
## 2021-11-22 ENCOUNTER — Emergency Department (HOSPITAL_BASED_OUTPATIENT_CLINIC_OR_DEPARTMENT_OTHER): Payer: Self-pay

## 2021-11-22 DIAGNOSIS — N12 Tubulo-interstitial nephritis, not specified as acute or chronic: Secondary | ICD-10-CM | POA: Diagnosis present

## 2021-11-22 DIAGNOSIS — N390 Urinary tract infection, site not specified: Secondary | ICD-10-CM

## 2021-11-22 LAB — CREATININE, SERUM
Creatinine, Ser: 0.89 mg/dL (ref 0.44–1.00)
GFR, Estimated: >60 mL/min (ref >60)

## 2021-11-22 LAB — CBC
HCT: 36.6 % (ref 36.0–46.0)
Hemoglobin: 11.4 g/dL — ABNORMAL LOW (ref 12.0–15.0)
MCH: 27 pg (ref 26.0–34.0)
MCHC: 31.1 g/dL (ref 30.0–36.0)
MCV: 86.7 fL (ref 80.0–100.0)
Platelets: 271 10*3/uL (ref 150–400)
RBC: 4.22 MIL/uL (ref 3.87–5.11)
RDW: 13.1 % (ref 11.5–15.5)
WBC: 7.9 10*3/uL (ref 4.0–10.5)
nRBC: 0 % (ref 0.0–0.2)

## 2021-11-22 LAB — LACTIC ACID, PLASMA: Lactic Acid, Venous: 0.7 mmol/L (ref 0.5–1.9)

## 2021-11-22 LAB — RESP PANEL BY RT-PCR (FLU A&B, COVID) ARPGX2
Influenza A by PCR: NEGATIVE
Influenza B by PCR: NEGATIVE
SARS Coronavirus 2 by RT PCR: NEGATIVE

## 2021-11-22 MED ORDER — SODIUM CHLORIDE 0.9 % IV SOLN
2.0000 g | Freq: Two times a day (BID) | INTRAVENOUS | Status: DC
  Administered 2021-11-22 – 2021-11-24 (×4): 2 g via INTRAVENOUS
  Filled 2021-11-22 (×5): qty 2

## 2021-11-22 MED ORDER — FENTANYL CITRATE PF 50 MCG/ML IJ SOSY
50.0000 ug | PREFILLED_SYRINGE | Freq: Once | INTRAMUSCULAR | Status: AC
  Administered 2021-11-22: 50 ug via INTRAVENOUS
  Filled 2021-11-22: qty 1

## 2021-11-22 MED ORDER — ACETAMINOPHEN 650 MG RE SUPP: 650.0000 mg | Freq: Four times a day (QID) | RECTAL | Status: DC | PRN

## 2021-11-22 MED ORDER — SODIUM CHLORIDE 0.9 % IV SOLN: INTRAVENOUS | Status: DC

## 2021-11-22 MED ORDER — FENTANYL CITRATE PF 50 MCG/ML IJ SOSY
50.0000 ug | PREFILLED_SYRINGE | INTRAMUSCULAR | Status: DC | PRN
  Administered 2021-11-22 – 2021-11-24 (×4): 50 ug via INTRAVENOUS
  Filled 2021-11-22 (×4): qty 1

## 2021-11-22 MED ORDER — ENOXAPARIN SODIUM 40 MG/0.4ML IJ SOSY
40.0000 mg | PREFILLED_SYRINGE | INTRAMUSCULAR | Status: DC
  Administered 2021-11-22 – 2021-11-23 (×2): 40 mg via SUBCUTANEOUS
  Filled 2021-11-22 (×2): qty 0.4

## 2021-11-22 MED ORDER — ONDANSETRON HCL 4 MG/2ML IJ SOLN
4.0000 mg | Freq: Once | INTRAMUSCULAR | Status: AC
  Administered 2021-11-22: 4 mg via INTRAVENOUS
  Filled 2021-11-22: qty 2

## 2021-11-22 MED ORDER — ACETAMINOPHEN 325 MG PO TABS
650.0000 mg | ORAL_TABLET | Freq: Four times a day (QID) | ORAL | Status: DC | PRN
  Filled 2021-11-22: qty 2

## 2021-11-22 MED ORDER — VITAMIN D 25 MCG (1000 UNIT) PO TABS
1000.0000 [IU] | ORAL_TABLET | Freq: Every day | ORAL | Status: DC
  Administered 2021-11-22 – 2021-11-24 (×3): 1000 [IU] via ORAL
  Filled 2021-11-22 (×3): qty 1

## 2021-11-22 MED ORDER — OXYCODONE-ACETAMINOPHEN 7.5-325 MG PO TABS
1.0000 | ORAL_TABLET | Freq: Four times a day (QID) | ORAL | Status: DC | PRN
  Administered 2021-11-22: 1 via ORAL
  Filled 2021-11-22 (×2): qty 1

## 2021-11-22 MED ORDER — FENTANYL CITRATE PF 50 MCG/ML IJ SOSY
50.0000 ug | PREFILLED_SYRINGE | Freq: Once | INTRAMUSCULAR | Status: AC
Start: 2021-11-22 — End: 2021-11-22
  Administered 2021-11-22: 50 ug via INTRAVENOUS
  Filled 2021-11-22: qty 1

## 2021-11-22 MED ORDER — LACTATED RINGERS IV BOLUS
1000.0000 mL | Freq: Once | INTRAVENOUS | Status: AC
  Administered 2021-11-22: 1000 mL via INTRAVENOUS

## 2021-11-22 MED ORDER — ONDANSETRON HCL 4 MG/2ML IJ SOLN
4.0000 mg | Freq: Four times a day (QID) | INTRAMUSCULAR | Status: DC | PRN
  Administered 2021-11-22 – 2021-11-23 (×4): 4 mg via INTRAVENOUS
  Filled 2021-11-22 (×4): qty 2

## 2021-11-22 MED ORDER — SODIUM CHLORIDE 0.9 % IV SOLN
2.0000 g | Freq: Once | INTRAVENOUS | Status: AC
  Administered 2021-11-22: 2 g via INTRAVENOUS
  Filled 2021-11-22: qty 2

## 2021-11-22 MED ORDER — VITAMIN B-12 1000 MCG PO TABS
1000.0000 ug | ORAL_TABLET | Freq: Every day | ORAL | Status: DC
  Administered 2021-11-22 – 2021-11-24 (×3): 1000 ug via ORAL
  Filled 2021-11-22 (×4): qty 1

## 2021-11-22 NOTE — Plan of Care (Signed)
Report received from [facility]. VS taken upon pt arrival to unit. Pt oriented to room and call bell. Team paged.    Problem: Education: Goal: Knowledge of General Education information will improve Description: Including pain rating scale, medication(s)/side effects and non-pharmacologic comfort measures Outcome: Progressing   Problem: Health Behavior/Discharge Planning: Goal: Ability to manage health-related needs will improve Outcome: Progressing   Problem: Clinical Measurements: Goal: Ability to maintain clinical measurements within normal limits will improve Outcome: Progressing Goal: Will remain free from infection Outcome: Progressing Goal: Diagnostic test results will improve Outcome: Progressing   Problem: Coping: Goal: Level of anxiety will decrease Outcome: Progressing   Problem: Elimination: Goal: Will not experience complications related to bowel motility Outcome: Progressing Goal: Will not experience complications related to urinary retention Outcome: Progressing   Problem: Pain Managment: Goal: General experience of comfort will improve Outcome: Progressing   Problem: Safety: Goal: Ability to remain free from injury will improve Outcome: Progressing

## 2021-11-22 NOTE — Progress Notes (Signed)
Pharmacy Antibiotic Note  Donna [name] is a 50 y.o. female admitted on 11/21/2021 with recurrent UTI.  Pharmacy has been consulted for Cefepime dosing.  Plan: Cefepime 2g IV q12h Follow up renal function & cultures  Height: 5' (152.4 cm) Weight: 69.9 kg (154 lb) IBW/kg (Calculated) : 45.5  Temp (24hrs), Avg:100.9 F (38.3 C), Min:99.6 F (37.6 C), Max:102.4 F (39.1 C)  Recent Labs  Lab 11/21/21 2220 11/21/21 2308 11/22/21 1401  WBC  --  10.9*  --   CREATININE 0.87  --   --   LATICACIDVEN 0.6  --  0.7    Estimated Creatinine Clearance: 66 mL/min (by C-G formula based on SCr of 0.87 mg/dL).    Allergies  Allergen Reactions   Bee Venom Anaphylaxis   Diamox [Acetazolamide] Anaphylaxis   Nsaids Anaphylaxis   Omnipaque [Iohexol] Anaphylaxis   Tolmetin Anaphylaxis   Compazine [Prochlorperazine] Hives   Iopamidol Hives   Lactose Intolerance (Gi) Nausea And Vomiting   Reglan [Metoclopramide] Hives   Verin [Aspirin]     Antimicrobials this admission: PTA Cipro completed 12/10 Cefepime >>  Dose adjustments this admission:  Microbiology results: 12/10 BCx: 12/10 UCx:  Thank you for allowing pharmacy to be a part of this patient's care.  Loralee Pacas, PharmD, BCPS Pharmacy: [phone] 11/22/2021 3:44 PM

## 2021-11-22 NOTE — H&P (Signed)
History and Physical    Davie Sagona [facility] ERD:[id number] DOB: Aug 27, 1971 DOA: 11/21/2021  PCP: Pcp, No  Patient coming from: Home  Chief Complaint: abdominal pain, dysuria  HPI: Donna [name] is a 50 y.o. female with medical history significant of lupus. Presenting w/ abdominal pain. Her symptoms started 2 weeks ago. She had RUQ abdominal pain that was achy and she noticed that there was a change in the smell and color of her urine. She went to urgent care and was diagnosed with a UTI. She was given 10 days of vantin. Her symptoms did not completely improve. Once her abx were completed, she started having fevers and worsening abdominal pain. It now radiates to her right flank. She has increased burning w/ urination. When her symptoms did not resolve yesterday, she decided to come to the ED for assistance.   ED Course: CT renal study did not show stones. UA was concerning for UTI. She was started on cefepime. TRH was called for admission.   Review of Systems:  Review of systems is otherwise negative for all not mentioned in HPI.   PMHx Past Medical History:  Diagnosis Date   DVT (deep venous thrombosis) ([place])    right arm   Endometriosis    Lupus (systemic lupus erythematosus) ([place]) 2007   Migraine    Seizures ([place])    last sz 06/2013, no meds    PSHx Past Surgical History:  Procedure Laterality Date   ABDOMINAL HYSTERECTOMY     x2 partial   CESAREAN SECTION     x3   MASS EXCISION  02/2014   abd    SocHx  reports that she has never smoked. She has never used smokeless tobacco. She reports that she does not drink alcohol and does not use drugs.  Allergies  Allergen Reactions   Bee Venom Anaphylaxis   Diamox [Acetazolamide] Anaphylaxis   Nsaids Anaphylaxis   Omnipaque [Iohexol] Anaphylaxis   Tolmetin Anaphylaxis   Compazine [Prochlorperazine] Hives   Iopamidol Hives   Lactose Intolerance (Gi) Nausea And Vomiting   Reglan [Metoclopramide] Hives   Verin [Aspirin]      FamHx Family History  Problem Relation Age of Onset   CVA Mother 23   Alzheimer's disease Father    Breast cancer Sister    Ovarian cancer Sister    Stomach cancer Sister    Breast cancer Maternal Aunt    Ovarian cancer Maternal Aunt    Stomach cancer Maternal Aunt    Birth defects Maternal Uncle    Colon cancer Neg Hx    Esophageal cancer Neg Hx    Pancreatic cancer Neg Hx     Prior to Admission medications   Medication Sig Start Date End Date Taking? Authorizing Provider  blood glucose meter kit and supplies KIT Dispense based on patient and insurance preference. Use up to four times daily as directed. (FOR ICD-9 250.00, 250.01). 03/11/18   Raiford Noble Latif, DO  cholecalciferol (VITAMIN D) 1000 units tablet Take 1,000 Units by mouth daily.    [provider]  diphenhydrAMINE (BENADRYL) 25 MG tablet Take 25 mg by mouth every 6 (six) hours as needed for allergies.    [provider]  EPINEPHrine (EPIPEN 2-PAK) 0.3 mg/0.3 mL IJ SOAJ injection Inject 0.3 mLs (0.3 mg total) into the muscle once as needed (for severe allergic reaction). Patient taking differently: Inject 0.3 mg into the muscle once as needed for anaphylaxis. 06/28/20   Molpus, Jenny Reichmann, MD  HYDROcodone-acetaminophen (NORCO/VICODIN) 5-325 MG  tablet Take 1 tablet by mouth every 6 (six) hours as needed for severe pain. 07/10/21   Truddie Hidden, MD  lidocaine (XYLOCAINE) 5 % ointment Apply 1 application topically as needed. Patient taking differently: Apply 1 application topically daily as needed. 03/06/21   Khatri, Hina, PA-C  methocarbamol (ROBAXIN) 500 MG tablet Take 1 tablet (500 mg total) by mouth 2 (two) times daily. 07/10/21   Truddie Hidden, MD  ondansetron (ZOFRAN ODT) 8 MG disintegrating tablet Take 1 tablet (8 mg total) by mouth every 8 (eight) hours as needed for nausea or vomiting. Patient not taking: Reported on 03/20/2021 06/28/20   Molpus, John, MD  predniSONE (STERAPRED UNI-PAK 21 TAB)  10 MG (21) TBPK tablet 14m Tabs, 6 day taper. Use as directed Patient not taking: No sig reported 08/21/20   STruddie Hidden MD  vitamin B-12 (CYANOCOBALAMIN) 1000 MCG tablet Take 1,000 mcg by mouth daily.    [provider]  dicyclomine (BENTYL) 20 MG tablet Take 1 tablet (20 mg total) by mouth 2 (two) times daily as needed for spasms. 02/20/19 06/28/20  FRodell PernaA, PA-C  famotidine (PEPCID) 20 MG tablet Take 1 tablet (20 mg total) by mouth 2 (two) times daily. 02/20/19 06/28/20  Fawze, Mina A, PA-C  ipratropium-albuterol (DUONEB) 0.5-2.5 (3) MG/3ML SOLN Take 3 mLs by nebulization every 4 (four) hours as needed. Patient not taking: Reported on 09/22/2018 03/11/18 06/07/19  SRaiford NobleLatif, DO  omeprazole (PRILOSEC) 20 MG capsule Take 1 capsule (20 mg total) by mouth 2 (two) times daily before a meal. 06/07/19 06/28/20  Pollina, CGwenyth Allegra MD  promethazine (PHENERGAN) 25 MG suppository Place 1 suppository (25 mg total) rectally every 6 (six) hours as needed for nausea or vomiting. 06/07/19 06/28/20  POrpah Greek MD  sucralfate (CARAFATE) 1 g tablet Take 1 tablet (1 g total) by mouth 4 (four) times daily -  with meals and at bedtime. Patient not taking: Reported on 06/06/2019 02/20/19 06/07/19  FRenita Papa PA-C    Physical Exam: Vitals:   11/22/21 1215 11/22/21 1230 11/22/21 1245 11/22/21 1348  BP: 121/78 119/84 (!) 129/110 132/82  Pulse: (!) 102 (!) 109 (!) 103 (!) 109  Resp:    20  Temp:    (!) 100.6 F (38.1 C)  TempSrc:    Oral  SpO2: 93% 100% 95% 100%  Weight:      Height:        General: 50y.o. female resting in bed in NAD Eyes: PERRL, normal sclera ENMT: Nares patent w/o discharge, orophaynx clear, dentition normal, ears w/o discharge/lesions/ulcers Neck: Supple, trachea midline Cardiovascular: RRR, +S1, S2, no m/g/r, equal pulses throughout Respiratory: CTABL, no w/r/r, normal WOB GI: BS+, ND, RUQ TTP, no masses noted, no organomegaly noted MSK: No  e/c/c, R CVAT Skin: No rashes, bruises, ulcerations noted Neuro: A&O x 3, no focal deficits Psyc: Appropriate interaction and affect, calm/cooperative  Labs on Admission: I have personally reviewed following labs and imaging studies  CBC: Recent Labs  Lab 11/21/21 2308  WBC 10.9*  NEUTROABS 7.1  HGB 11.6*  HCT 36.4  MCV 83.5  PLT 3924  Basic Metabolic Panel: Recent Labs  Lab 11/21/21 2220  NA 135  K 4.3  CL 99  CO2 23  GLUCOSE 159*  BUN 13  CREATININE 0.87  CALCIUM 10.9*   GFR: Estimated Creatinine Clearance: 66 mL/min (by C-G formula based on SCr of 0.87 mg/dL). Liver Function Tests: Recent Labs  Lab  11/21/21 2220  AST 41  ALT 28  ALKPHOS 117  BILITOT 0.8  PROT 8.6*  ALBUMIN 4.7   No results for input(s): LIPASE, AMYLASE in the last 168 hours. No results for input(s): AMMONIA in the last 168 hours. Coagulation Profile: No results for input(s): INR, PROTIME in the last 168 hours. Cardiac Enzymes: No results for input(s): CKTOTAL, CKMB, CKMBINDEX, TROPONINI in the last 168 hours. BNP (last 3 results) No results for input(s): PROBNP in the last 8760 hours. HbA1C: No results for input(s): HGBA1C in the last 72 hours. CBG: No results for input(s): GLUCAP in the last 168 hours. Lipid Profile: No results for input(s): CHOL, HDL, LDLCALC, TRIG, CHOLHDL, LDLDIRECT in the last 72 hours. Thyroid Function Tests: No results for input(s): TSH, T4TOTAL, FREET4, T3FREE, THYROIDAB in the last 72 hours. Anemia Panel: No results for input(s): VITAMINB12, FOLATE, FERRITIN, TIBC, IRON, RETICCTPCT in the last 72 hours. Urine analysis:    Component Value Date/Time   COLORURINE YELLOW 11/21/2021 2308   APPEARANCEUR CLOUDY (A) 11/21/2021 2308   LABSPEC 1.018 11/21/2021 2308   PHURINE 5.5 11/21/2021 2308   GLUCOSEU 100 (A) 11/21/2021 2308   HGBUR SMALL (A) 11/21/2021 2308   BILIRUBINUR NEGATIVE 11/21/2021 2308   KETONESUR 15 (A) 11/21/2021 2308   PROTEINUR 30 (A)  11/21/2021 2308   UROBILINOGEN 0.2 04/14/2015 0300   NITRITE NEGATIVE 11/21/2021 2308   LEUKOCYTESUR LARGE (A) 11/21/2021 2308    Radiological Exams on Admission: CT Renal Stone Study  Result Date: 11/22/2021 CLINICAL DATA:  Right lower quadrant pain, fever EXAM: CT ABDOMEN AND PELVIS WITHOUT CONTRAST TECHNIQUE: Multidetector CT imaging of the abdomen and pelvis was performed following the standard protocol without IV contrast. COMPARISON:  03/20/2021 FINDINGS: Lower chest: Lung bases are clear. Hepatobiliary: Unenhanced liver is unremarkable. Gallbladder is unremarkable. No intrahepatic or extrahepatic duct dilatation. Pancreas: Within normal limits. Spleen: Within normal limits. Adrenals/Urinary Tract: Adrenal glands are within normal limits. 5.2 cm interpolar left renal cyst (series 2/image 26). Right kidney within normal limits. No renal calculus or hydronephrosis. Bladder is within normal limits. Stomach/Bowel: Stomach is within normal limits. No evidence of bowel obstruction. Normal appendix (series 2/image 85). No colonic wall thickening or inflammatory changes. Vascular/Lymphatic: No evidence of abdominal aortic aneurysm. No suspicious abdominopelvic lymphadenopathy. Reproductive: Status post hysterectomy. No adnexal masses. Other: No abdominopelvic ascites. Musculoskeletal: Visualized osseous structures are within normal limits. IMPRESSION: No evidence of bowel obstruction.  Normal appendix. 5.2 cm interpolar left renal cyst, benign. No CT findings to account for the patient's right lower quadrant abdominal pain. Electronically Signed   By: Julian Hy M.D.   On: 11/22/2021 01:31    EKG: None obtained in ED  Assessment/Plan UTI/Pyelonephritis SIRS     - placed in obs, tele     - recent dx of pyelo and never completely improved from Tx     - continue cefepime     - imaging negative for stones, hydro, abscess     - PRN anti-emetics, pain control  Hx of lupus     - continue outpt  follow up  Hyperglycemia     - check A1c  DVT prophylaxis: lovenox  Code Status: FULL  Family Communication: None at bedside  Consults called: None   Status is: Observation  The patient remains OBS appropriate and will d/c before 2 midnights.  Jonnie Finner DO [facility]  If 7PM-7AM, please contact night-coverage [email or web address]  11/22/2021, 3:22 PM

## 2021-11-22 NOTE — ED Notes (Signed)
Pt stated that her nausea is gone. Pt reports the edge has been taken off her pain but her pain is still a 7. Pt reported  medication just takes the edge off the pain. Pt not in distress and displays a good demeanor.

## 2021-11-23 DIAGNOSIS — N12 Tubulo-interstitial nephritis, not specified as acute or chronic: Secondary | ICD-10-CM

## 2021-11-23 DIAGNOSIS — I509 Heart failure, unspecified: Secondary | ICD-10-CM

## 2021-11-23 LAB — CBC
HCT: 36.4 % (ref 36.0–46.0)
Hemoglobin: 11.3 g/dL — ABNORMAL LOW (ref 12.0–15.0)
MCH: 27 pg (ref 26.0–34.0)
MCHC: 31 g/dL (ref 30.0–36.0)
MCV: 86.9 fL (ref 80.0–100.0)
Platelets: 260 10*3/uL (ref 150–400)
RBC: 4.19 MIL/uL (ref 3.87–5.11)
RDW: 13.1 % (ref 11.5–15.5)
WBC: 6.8 10*3/uL (ref 4.0–10.5)
nRBC: 0 % (ref 0.0–0.2)

## 2021-11-23 LAB — COMPREHENSIVE METABOLIC PANEL
ALT: 38 U/L (ref 0–44)
AST: 32 U/L (ref 15–41)
Albumin: 3.9 g/dL (ref 3.5–5.0)
Alkaline Phosphatase: 107 U/L (ref 38–126)
Anion gap: 10 (ref 5–15)
BUN: 14 mg/dL (ref 6–20)
CO2: 25 mmol/L (ref 22–32)
Calcium: 9.1 mg/dL (ref 8.9–10.3)
Chloride: 101 mmol/L (ref 98–111)
Creatinine, Ser: 0.82 mg/dL (ref 0.44–1.00)
GFR, Estimated: >60 mL/min (ref >60)
Glucose, Bld: 124 mg/dL — ABNORMAL HIGH (ref 70–99)
Potassium: 3.8 mmol/L (ref 3.5–5.1)
Sodium: 136 mmol/L (ref 135–145)
Total Bilirubin: 0.8 mg/dL (ref 0.3–1.2)
Total Protein: 7.4 g/dL (ref 6.5–8.1)

## 2021-11-23 MED ORDER — GUAIFENESIN 100 MG/5ML PO LIQD: 5.0000 mL | ORAL | Status: DC | PRN

## 2021-11-23 MED ORDER — HYDRALAZINE HCL 20 MG/ML IJ SOLN: 10.0000 mg | INTRAMUSCULAR | Status: DC | PRN

## 2021-11-23 MED ORDER — SENNOSIDES-DOCUSATE SODIUM 8.6-50 MG PO TABS: 1.0000 | ORAL_TABLET | Freq: Every evening | ORAL | Status: DC | PRN

## 2021-11-23 MED ORDER — METOPROLOL TARTRATE 5 MG/5ML IV SOLN: 5.0000 mg | INTRAVENOUS | Status: DC | PRN

## 2021-11-23 MED ORDER — SCOPOLAMINE 1 MG/3DAYS TD PT72
1.0000 | MEDICATED_PATCH | TRANSDERMAL | Status: DC
  Administered 2021-11-23: 1.5 mg via TRANSDERMAL
  Filled 2021-11-23: qty 1

## 2021-11-23 MED ORDER — TRAZODONE HCL 50 MG PO TABS
50.0000 mg | ORAL_TABLET | Freq: Every evening | ORAL | Status: DC | PRN
  Administered 2021-11-24: 50 mg via ORAL
  Filled 2021-11-23: qty 1

## 2021-11-23 MED ORDER — SODIUM CHLORIDE 0.9 % IV SOLN
25.0000 mg | Freq: Four times a day (QID) | INTRAVENOUS | Status: DC | PRN
  Administered 2021-11-23: 25 mg via INTRAVENOUS
  Filled 2021-11-23: qty 25

## 2021-11-23 NOTE — Plan of Care (Signed)
Pt satisfied with pain and anti-nausea regime   Problem: Education: Goal: Knowledge of General Education information will improve Description: Including pain rating scale, medication(s)/side effects and non-pharmacologic comfort measures Outcome: Progressing   Problem: Health Behavior/Discharge Planning: Goal: Ability to manage health-related needs will improve Outcome: Progressing   Problem: Clinical Measurements: Goal: Ability to maintain clinical measurements within normal limits will improve Outcome: Progressing Goal: Will remain free from infection Outcome: Progressing Goal: Diagnostic test results will improve Outcome: Progressing   Problem: Coping: Goal: Level of anxiety will decrease Outcome: Progressing   Problem: Elimination: Goal: Will not experience complications related to bowel motility Outcome: Progressing Goal: Will not experience complications related to urinary retention Outcome: Progressing   Problem: Pain Managment: Goal: General experience of comfort will improve Outcome: Progressing   Problem: Safety: Goal: Ability to remain free from injury will improve Outcome: Progressing

## 2021-11-23 NOTE — Progress Notes (Signed)
PROGRESS NOTE    Donna [name] [facility]  GYJ:[id number] DOB: Mar 11, 1971 DOA: 11/21/2021 PCP: Pcp, No   Brief Narrative:  50 year old with history of lupus not on any home medication comes to the hospital with complains of right-sided abdominal pain ongoing for 2 weeks.  She went to an urgent care where she was diagnosed with UTI started on 10 days of oral Vantin but despite of completing her antibiotics she continues to have burning with urination.  The ER her UA was consistent with UTI, started on empiric cefepime.   Assessment & Plan:   Principal Problem:   Pyelonephritis  Sepsis secondary to urinary tract infection - There is concerns of early pyelonephritis, empirically on IV Rocephin.  She failed 10 days outpatient oral Vantin.  Follow-up urine culture and blood culture data and tailor antibiotics accordingly.  Continue IV fluids  Care everywhere records reviewed, she was diagnosed with UTI on 10/29/2021 at [facility].  UA was positive for UTI and discharged on 10 days of oral Vantin.  Urine cultures grew E Coli, appear sto be pansensitive at that time including Rocephin.  5.2 cm left renal cyst - Likely to be benign but should be followed up outpatient with PCP.  History of lupus - Not on any home medications  Vitamin D deficiency - Continue supplements   DVT prophylaxis: Lovenox  Code Status: Full code Family Communication:    Patient should remain in the hospital until urine culture data is available during this admission.  She has failed full 10 days of oral Vantin as outpatient.  Currently receiving IV cefepime.  Hopefully discharge in next 24-48 hours.   Subjective: Seen and examined at bedside, tells me her pain has moved from right upper quadrant to the backside but slightly better.  Urine is overall clear.  Denies any burning.  Review of Systems Otherwise negative except as per HPI, including: General: Denies fever, chills, night sweats or unintended weight loss. Resp:  Denies cough, wheezing, shortness of breath. Cardiac: Denies chest pain, palpitations, orthopnea, paroxysmal nocturnal dyspnea. GI: Denies nausea, vomiting, diarrhea or constipation GU: Denies dysuria, frequency, hesitancy or incontinence MS: Denies muscle aches, joint pain or swelling Neuro: Denies headache, neurologic deficits (focal weakness, numbness, tingling), abnormal gait Psych: Denies anxiety, depression, SI/HI/AVH Skin: Denies new rashes or lesions ID: Denies sick contacts, exotic exposures, travel  Examination:  General exam: Appears calm and comfortable  Respiratory system: Clear to auscultation. Respiratory effort normal. Cardiovascular system: S1 & S2 heard, RRR. No JVD, murmurs, rubs, gallops or clicks. No pedal edema. Gastrointestinal system: Abdomen is nondistended, soft and nontender. No organomegaly or masses felt. Normal bowel sounds heard. Central nervous system: Alert and oriented. No focal neurological deficits. Extremities: Symmetric 5 x 5 power.  Mild right-sided CVA tenderness Skin: No rashes, lesions or ulcers Psychiatry: Judgement and insight appear normal. Mood & affect appropriate.     Objective: Vitals:   11/22/21 1734 11/22/21 2002 11/23/21 0012 11/23/21 0541  BP: (!) 144/81 134/82 126/81 126/83  Pulse: (!) 101 (!) 111 88 98  Resp: 20     Temp: 100 F (37.8 C) 100.3 F (37.9 C) 99.2 F (37.3 C) 98.9 F (37.2 C)  TempSrc: Oral Oral Oral Oral  SpO2: 100% 99%  100%  Weight:      Height:        Intake/Output Summary (Last 24 hours) at 11/23/2021 0804 Last data filed at 11/23/2021 0759 Gross per 24 hour  Intake 902.95 ml  Output --  Net 902.95  ml   Filed Weights   11/21/21 1948 11/22/21 1348  Weight: 69.9 kg 70.3 kg     Data Reviewed:   CBC: Recent Labs  Lab 11/21/21 2308 11/22/21 1836 11/23/21 0423  WBC 10.9* 7.9 6.8  NEUTROABS 7.1  --   --   HGB 11.6* 11.4* 11.3*  HCT 36.4 36.6 36.4  MCV 83.5 86.7 86.9  PLT 326 271 260    Basic Metabolic Panel: Recent Labs  Lab 11/21/21 2220 11/22/21 1836 11/23/21 0423  NA 135  --  136  K 4.3  --  3.8  CL 99  --  101  CO2 23  --  25  GLUCOSE 159*  --  124*  BUN 13  --  14  CREATININE 0.87 0.89 0.82  CALCIUM 10.9*  --  9.1   GFR: Estimated Creatinine Clearance: 70.2 mL/min (by C-G formula based on SCr of 0.82 mg/dL). Liver Function Tests: Recent Labs  Lab 11/21/21 2220 11/23/21 0423  AST 41 32  ALT 28 38  ALKPHOS 117 107  BILITOT 0.8 0.8  PROT 8.6* 7.4  ALBUMIN 4.7 3.9   No results for input(s): LIPASE, AMYLASE in the last 168 hours. No results for input(s): AMMONIA in the last 168 hours. Coagulation Profile: No results for input(s): INR, PROTIME in the last 168 hours. Cardiac Enzymes: No results for input(s): CKTOTAL, CKMB, CKMBINDEX, TROPONINI in the last 168 hours. BNP (last 3 results) No results for input(s): PROBNP in the last 8760 hours. HbA1C: No results for input(s): HGBA1C in the last 72 hours. CBG: No results for input(s): GLUCAP in the last 168 hours. Lipid Profile: No results for input(s): CHOL, HDL, LDLCALC, TRIG, CHOLHDL, LDLDIRECT in the last 72 hours. Thyroid Function Tests: No results for input(s): TSH, T4TOTAL, FREET4, T3FREE, THYROIDAB in the last 72 hours. Anemia Panel: No results for input(s): VITAMINB12, FOLATE, FERRITIN, TIBC, IRON, RETICCTPCT in the last 72 hours. Sepsis Labs: Recent Labs  Lab 11/21/21 2220 11/22/21 1401  LATICACIDVEN 0.6 0.7    Recent Results (from the past 240 hour(s))  Resp Panel by RT-PCR (Flu A&B, Covid) Nasopharyngeal Swab     Status: None   Collection Time: 11/22/21 12:25 AM   Specimen: Nasopharyngeal Swab; Nasopharyngeal(NP) swabs in vial transport medium  Result Value Ref Range Status   SARS Coronavirus 2 by RT PCR NEGATIVE NEGATIVE Final    Comment: (NOTE) SARS-CoV-2 target nucleic acids are NOT DETECTED.  The SARS-CoV-2 RNA is generally detectable in upper respiratory specimens  during the acute phase of infection. The lowest concentration of SARS-CoV-2 viral copies this assay can detect is 138 copies/mL. A negative result does not preclude SARS-Cov-2 infection and should not be used as the sole basis for treatment or other patient management decisions. A negative result may occur with  improper specimen collection/handling, submission of specimen other than nasopharyngeal swab, presence of viral mutation(s) within the areas targeted by this assay, and inadequate number of viral copies(<138 copies/mL). A negative result must be combined with clinical observations, patient history, and epidemiological information. The expected result is Negative.  Fact Sheet for Patients:  BloggerCourse.com  Fact Sheet for Healthcare Providers:  SeriousBroker.it  This test is no t yet approved or cleared by the [place] FDA and  has been authorized for detection and/or diagnosis of SARS-CoV-2 by FDA under an Emergency Use Authorization (EUA). This EUA will remain  in effect (meaning this test can be used) for the duration of the COVID-19 declaration under Section 564(b)(1) of  the Act, 21 U.S.C.section 360bbb-3(b)(1), unless the authorization is terminated  or revoked sooner.       Influenza A by PCR NEGATIVE NEGATIVE Final   Influenza B by PCR NEGATIVE NEGATIVE Final    Comment: (NOTE) The Xpert Xpress SARS-CoV-2/FLU/RSV plus assay is intended as an aid in the diagnosis of influenza from Nasopharyngeal swab specimens and should not be used as a sole basis for treatment. Nasal washings and aspirates are unacceptable for Xpert Xpress SARS-CoV-2/FLU/RSV testing.  Fact Sheet for Patients: BloggerCourse.com  Fact Sheet for Healthcare Providers: SeriousBroker.it  This test is not yet approved or cleared by the [place] FDA and has been authorized for detection  and/or diagnosis of SARS-CoV-2 by FDA under an Emergency Use Authorization (EUA). This EUA will remain in effect (meaning this test can be used) for the duration of the COVID-19 declaration under Section 564(b)(1) of the Act, 21 U.S.C. section 360bbb-3(b)(1), unless the authorization is terminated or revoked.  Performed at [facility], [address] [place], [place] [address]          Radiology Studies: CT Renal Stone Study  Result Date: 11/22/2021 CLINICAL DATA:  Right lower quadrant pain, fever EXAM: CT ABDOMEN AND PELVIS WITHOUT CONTRAST TECHNIQUE: Multidetector CT imaging of the abdomen and pelvis was performed following the standard protocol without IV contrast. COMPARISON:  03/20/2021 FINDINGS: Lower chest: Lung bases are clear. Hepatobiliary: Unenhanced liver is unremarkable. Gallbladder is unremarkable. No intrahepatic or extrahepatic duct dilatation. Pancreas: Within normal limits. Spleen: Within normal limits. Adrenals/Urinary Tract: Adrenal glands are within normal limits. 5.2 cm interpolar left renal cyst (series 2/image 26). Right kidney within normal limits. No renal calculus or hydronephrosis. Bladder is within normal limits. Stomach/Bowel: Stomach is within normal limits. No evidence of bowel obstruction. Normal appendix (series 2/image 85). No colonic wall thickening or inflammatory changes. Vascular/Lymphatic: No evidence of abdominal aortic aneurysm. No suspicious abdominopelvic lymphadenopathy. Reproductive: Status post hysterectomy. No adnexal masses. Other: No abdominopelvic ascites. Musculoskeletal: Visualized osseous structures are within normal limits. IMPRESSION: No evidence of bowel obstruction.  Normal appendix. 5.2 cm interpolar left renal cyst, benign. No CT findings to account for the patient's right lower quadrant abdominal pain. Electronically Signed   By: Charline Bills M.D.   On: 11/22/2021 01:31        Scheduled Meds:   cholecalciferol  1,000 Units Oral Daily   enoxaparin (LOVENOX) injection  40 mg Subcutaneous Q24H   vitamin B-12  1,000 mcg Oral Daily   Continuous Infusions:  sodium chloride 100 mL/hr at 11/23/21 0444   ceFEPime (MAXIPIME) IV 2 g (11/23/21 0447)     LOS: 0 days   Time spent= 35 mins    Dadrian Ballantine Joline Maxcy, MD [facility]  If 7PM-7AM, please contact night-coverage  11/23/2021, 8:04 AM

## 2021-11-24 LAB — BASIC METABOLIC PANEL
Anion gap: 9 (ref 5–15)
BUN: 11 mg/dL (ref 6–20)
CO2: 24 mmol/L (ref 22–32)
Calcium: 8.7 mg/dL — ABNORMAL LOW (ref 8.9–10.3)
Chloride: 105 mmol/L (ref 98–111)
Creatinine, Ser: 0.76 mg/dL (ref 0.44–1.00)
GFR, Estimated: >60 mL/min (ref >60)
Glucose, Bld: 108 mg/dL — ABNORMAL HIGH (ref 70–99)
Potassium: 3.7 mmol/L (ref 3.5–5.1)
Sodium: 138 mmol/L (ref 135–145)

## 2021-11-24 LAB — URINE CULTURE: Culture: >=100000 — AB

## 2021-11-24 LAB — CBC
HCT: 33.8 % — ABNORMAL LOW (ref 36.0–46.0)
Hemoglobin: 10.6 g/dL — ABNORMAL LOW (ref 12.0–15.0)
MCH: 26.8 pg (ref 26.0–34.0)
MCHC: 31.4 g/dL (ref 30.0–36.0)
MCV: 85.6 fL (ref 80.0–100.0)
Platelets: 293 10*3/uL (ref 150–400)
RBC: 3.95 MIL/uL (ref 3.87–5.11)
RDW: 12.7 % (ref 11.5–15.5)
WBC: 5.4 10*3/uL (ref 4.0–10.5)
nRBC: 0 % (ref 0.0–0.2)

## 2021-11-24 LAB — MAGNESIUM: Magnesium: 2.1 mg/dL (ref 1.7–2.4)

## 2021-11-24 MED ORDER — SODIUM CHLORIDE 0.9 % IV SOLN
1.0000 g | Freq: Every day | INTRAVENOUS | Status: DC
  Filled 2021-11-24: qty 10

## 2021-11-24 MED ORDER — ONDANSETRON 8 MG PO TBDP: 8.0000 mg | ORAL_TABLET | Freq: Three times a day (TID) | ORAL | 0 refills | Status: DC | PRN

## 2021-11-24 MED ORDER — LEVOFLOXACIN 500 MG PO TABS: 500.0000 mg | ORAL_TABLET | Freq: Every day | ORAL | 0 refills | Status: AC

## 2021-11-24 NOTE — Care Management (Signed)
  Transition of Care [facility]) Screening Note   Patient Details  Name: SYBLE PICCO Date of Birth: [birth year]   Transition of Care [facility]) CM/SW Contact:    Geni Bers, RN Phone Number: 11/24/2021, 10:13 AM    Transition of Care Department [facility]) has reviewed patient and no TOC needs have been identified at this time. We will continue to monitor patient advancement through interdisciplinary progression rounds. If new patient transition needs arise, please place a TOC consult.

## 2021-11-24 NOTE — Discharge Summary (Signed)
Physician Discharge Summary  Sherlie Boyum [facility] TUN:[id number] DOB: 02-23-1971 DOA: 11/21/2021  PCP: Pcp, No  Admit date: 11/21/2021 Discharge date: 11/24/2021  Admitted From: Home Disposition:  Home  Recommendations for Outpatient Follow-up:  Follow up with PCP in 1-2 weeks Please obtain BMP/CBC in one week your next doctors visit.  Levaquin for 10 days PO Zofran ODT prn for nausea vomiting.   Discharge Condition: Stable CODE STATUS: Full Diet recommendation: Regular  Brief/Interim Summary: 50 year old with history of lupus not on any home medication comes to the hospital with complains of right-sided abdominal pain ongoing for 2 weeks.  She went to an urgent care where she was diagnosed with UTI started on 10 days of oral Vantin but despite of completing her antibiotics she continues to have burning with urination.  The ER her UA was consistent with UTI, started on empiric cefepime.  Eventually patient's cultures were pansensitive and she was transitioned to oral Levaquin to complete her course.     Assessment & Plan:   Principal Problem:   Pyelonephritis   Sepsis secondary to urinary tract infection Early right-sided pyelonephritis - Patient does have CVA tenderness therefore I suspect it is early pyelonephritis on the right side.  Urine cultures are pansensitive for E. coli from this admission therefore will transition patient from IV Rocephin to oral Levaquin for 10 more days.  She has previously failed oral Vantin therefore we will avoid that.  Advised oral hydration.   Care everywhere records reviewed, she was diagnosed with UTI on 10/29/2021 at [facility].  UA was positive for UTI and discharged on 10 days of oral Vantin.  Urine cultures grew E Coli, appear sto be pansensitive at that time including Rocephin.   5.2 cm left renal cyst - Likely to be benign but should be followed up outpatient with PCP.   History of lupus - Not on any home medications   Vitamin D deficiency -  Continue supplements  Body mass index is 30.27 kg/m.         Discharge Diagnoses:  Principal Problem:   Pyelonephritis Active Problems:   Acute exacerbation of CHF (congestive heart failure) (HCC)      Consultations: None  Subjective: Feels great no complaints wants to go home.  Tolerating orals without any issues.  Discharge Exam: Vitals:   11/24/21 0446 11/24/21 0831  BP: 116/67 139/88  Pulse: 88 97  Resp: 18 19  Temp: 99.6 F (37.6 C) 98.6 F (37 C)  SpO2: 99% 100%   Vitals:   11/23/21 1458 11/23/21 2056 11/24/21 0446 11/24/21 0831  BP: (!) 153/94 135/81 116/67 139/88  Pulse:  79 88 97  Resp: 16 18 18 19   Temp: 98.5 F (36.9 C) 98.2 F (36.8 C) 99.6 F (37.6 C) 98.6 F (37 C)  TempSrc: Oral Oral Oral Oral  SpO2: 100% 100% 99% 100%  Weight:      Height:        General: Pt is alert, awake, not in acute distress Cardiovascular: RRR, S1/S2 +, no rubs, no gallops Respiratory: CTA bilaterally, no wheezing, no rhonchi Abdominal: Soft, NT, ND, bowel sounds + Extremities: no edema, no cyanosis  Discharge Instructions   Allergies as of 11/24/2021       Reactions   Bee Venom Anaphylaxis   Diamox [acetazolamide] Anaphylaxis   Nsaids Anaphylaxis   Omnipaque [iohexol] Anaphylaxis   Tolmetin Anaphylaxis   Compazine [prochlorperazine] Hives   Iopamidol Hives   Lactose Intolerance (gi) Nausea And Vomiting   Reglan [metoclopramide]  Hives        Medication List     TAKE these medications    acetaminophen 500 MG tablet Commonly known as: TYLENOL Take 500 mg by mouth every 6 (six) hours as needed for moderate pain.   blood glucose meter kit and supplies Kit Dispense based on patient and insurance preference. Use up to four times daily as directed. (FOR ICD-9 250.00, 250.01).   cholecalciferol 1000 units tablet Commonly known as: VITAMIN D Take 1,000 Units by mouth daily.   diphenhydrAMINE 25 MG tablet Commonly known as: BENADRYL Take 25  mg by mouth every 6 (six) hours as needed for allergies.   EPINEPHrine 0.3 mg/0.3 mL Soaj injection Commonly known as: EpiPen 2-Pak Inject 0.3 mLs (0.3 mg total) into the muscle once as needed (for severe allergic reaction). What changed: reasons to take this   HYDROcodone-acetaminophen 5-325 MG tablet Commonly known as: NORCO/VICODIN Take 1 tablet by mouth every 6 (six) hours as needed for severe pain.   levofloxacin 500 MG tablet Commonly known as: LEVAQUIN Take 1 tablet (500 mg total) by mouth daily for 10 days.   lidocaine 5 % ointment Commonly known as: XYLOCAINE Apply 1 application topically as needed.   methocarbamol 500 MG tablet Commonly known as: ROBAXIN Take 1 tablet (500 mg total) by mouth 2 (two) times daily.   ondansetron 8 MG disintegrating tablet Commonly known as: Zofran ODT Take 1 tablet (8 mg total) by mouth every 8 (eight) hours as needed for up to 30 doses for nausea or vomiting.   OVER THE COUNTER MEDICATION Take 2 tablets by mouth daily. Amberen   predniSONE 10 MG (21) Tbpk tablet Commonly known as: STERAPRED UNI-PAK 21 TAB $Remo'10mg'LVjTK$  Tabs, 6 day taper. Use as directed   vitamin B-12 1000 MCG tablet Commonly known as: CYANOCOBALAMIN Take 1,000 mcg by mouth daily.        Follow-up Information     [facility]. Schedule an appointment as soon as possible for a visit in 1 week(s).   Contact information: [place] [id number] [phone]               Allergies  Allergen Reactions   Bee Venom Anaphylaxis   Diamox [Acetazolamide] Anaphylaxis   Nsaids Anaphylaxis   Omnipaque [Iohexol] Anaphylaxis   Tolmetin Anaphylaxis   Compazine [Prochlorperazine] Hives   Iopamidol Hives   Lactose Intolerance (Gi) Nausea And Vomiting   Reglan [Metoclopramide] Hives    You were cared for by a hospitalist during your hospital stay. If you have any questions about your discharge medications  or the care you received while you were in the hospital after you are discharged, you can call the unit and asked to speak with the hospitalist on call if the hospitalist that took care of you is not available. Once you are discharged, your primary care physician will handle any further medical issues. Please note that no refills for any discharge medications will be authorized once you are discharged, as it is imperative that you return to your primary care physician (or establish a relationship with a primary care physician if you do not have one) for your aftercare needs so that they can reassess your need for medications and monitor your lab values.   Procedures/Studies: CT Renal Stone Study  Result Date: 11/22/2021 CLINICAL DATA:  Right lower quadrant pain, fever EXAM: CT ABDOMEN AND PELVIS WITHOUT CONTRAST TECHNIQUE: Multidetector CT imaging of the abdomen and pelvis  was performed following the standard protocol without IV contrast. COMPARISON:  03/20/2021 FINDINGS: Lower chest: Lung bases are clear. Hepatobiliary: Unenhanced liver is unremarkable. Gallbladder is unremarkable. No intrahepatic or extrahepatic duct dilatation. Pancreas: Within normal limits. Spleen: Within normal limits. Adrenals/Urinary Tract: Adrenal glands are within normal limits. 5.2 cm interpolar left renal cyst (series 2/image 26). Right kidney within normal limits. No renal calculus or hydronephrosis. Bladder is within normal limits. Stomach/Bowel: Stomach is within normal limits. No evidence of bowel obstruction. Normal appendix (series 2/image 85). No colonic wall thickening or inflammatory changes. Vascular/Lymphatic: No evidence of abdominal aortic aneurysm. No suspicious abdominopelvic lymphadenopathy. Reproductive: Status post hysterectomy. No adnexal masses. Other: No abdominopelvic ascites. Musculoskeletal: Visualized osseous structures are within normal limits. IMPRESSION: No evidence of bowel obstruction.  Normal  appendix. 5.2 cm interpolar left renal cyst, benign. No CT findings to account for the patient's right lower quadrant abdominal pain. Electronically Signed   By: Julian Hy M.D.   On: 11/22/2021 01:31     The results of significant diagnostics from this hospitalization (including imaging, microbiology, ancillary and laboratory) are listed below for reference.     Microbiology: Recent Results (from the past 240 hour(s))  Culture, blood (routine x 2)     Status: None (Preliminary result)   Collection Time: 11/21/21 11:00 PM   Specimen: BLOOD  Result Value Ref Range Status   Specimen Description   Final    BLOOD LEFT HAND Performed at [facility], [address] [place], [place] [address]    Special Requests   Final    BOTTLES DRAWN AEROBIC AND ANAEROBIC Blood Culture results may not be optimal due to an excessive volume of blood received in culture bottles Performed at [facility], [address] [place], [place] [address]    Culture   Final    NO GROWTH 2 DAYS Performed at [facility], [place] [address] [place], [place] [address]    Report Status PENDING  Incomplete  Urine Culture     Status: Abnormal   Collection Time: 11/21/21 11:08 PM   Specimen: Urine, Clean Catch  Result Value Ref Range Status   Specimen Description   Final    URINE, CLEAN CATCH Performed at [facility], [address] [place], [place] [address]    Special Requests   Final    NONE Performed at [facility], [address], [place], [place] [address]    Culture >=100,000 COLONIES/mL ESCHERICHIA COLI (A)  Final   Report Status 11/24/2021 FINAL  Final   Organism ID, Bacteria ESCHERICHIA COLI (A)  Final      Susceptibility   Escherichia coli - MIC*    AMPICILLIN >=32 RESISTANT Resistant     CEFAZOLIN <=4 SENSITIVE Sensitive     CEFEPIME <=0.12 SENSITIVE Sensitive     CEFTRIAXONE <=0.25 SENSITIVE Sensitive      CIPROFLOXACIN <=0.25 SENSITIVE Sensitive     GENTAMICIN <=1 SENSITIVE Sensitive     IMIPENEM <=0.25 SENSITIVE Sensitive     NITROFURANTOIN <=16 SENSITIVE Sensitive     TRIMETH/SULFA <=20 SENSITIVE Sensitive     AMPICILLIN/SULBACTAM 16 INTERMEDIATE Intermediate     PIP/TAZO <=4 SENSITIVE Sensitive     * >=100,000 COLONIES/mL ESCHERICHIA COLI  Culture, blood (routine x 2)     Status: None (Preliminary result)   Collection Time: 11/21/21 11:21 PM   Specimen: BLOOD  Result Value Ref Range Status   Specimen Description   Final    BLOOD RIGHT  ANTECUBITAL Performed at [facility], [address] [place], [place] [address]    Special Requests   Final    BOTTLES DRAWN AEROBIC AND ANAEROBIC Blood Culture adequate volume Performed at [facility], [address] [place], [place] [address]    Culture   Final    NO GROWTH 2 DAYS Performed at [facility], [place] [address] [place], [place] [address]    Report Status PENDING  Incomplete  Resp Panel by RT-PCR (Flu A&B, Covid) Nasopharyngeal Swab     Status: None   Collection Time: 11/22/21 12:25 AM   Specimen: Nasopharyngeal Swab; Nasopharyngeal(NP) swabs in vial transport medium  Result Value Ref Range Status   SARS Coronavirus 2 by RT PCR NEGATIVE NEGATIVE Final    Comment: (NOTE) SARS-CoV-2 target nucleic acids are NOT DETECTED.  The SARS-CoV-2 RNA is generally detectable in upper respiratory specimens during the acute phase of infection. The lowest concentration of SARS-CoV-2 viral copies this assay can detect is 138 copies/mL. A negative result does not preclude SARS-Cov-2 infection and should not be used as the sole basis for treatment or other patient management decisions. A negative result may occur with  improper specimen collection/handling, submission of specimen other than nasopharyngeal swab, presence of viral mutation(s) within the areas targeted by this assay, and inadequate  number of viral copies(<138 copies/mL). A negative result must be combined with clinical observations, patient history, and epidemiological information. The expected result is Negative.  Fact Sheet for Patients:  EntrepreneurPulse.com.au  Fact Sheet for Healthcare Providers:  IncredibleEmployment.be  This test is no t yet approved or cleared by the [place] FDA and  has been authorized for detection and/or diagnosis of SARS-CoV-2 by FDA under an Emergency Use Authorization (EUA). This EUA will remain  in effect (meaning this test can be used) for the duration of the COVID-19 declaration under Section 564(b)(1) of the Act, 21 U.S.C.section 360bbb-3(b)(1), unless the authorization is terminated  or revoked sooner.       Influenza A by PCR NEGATIVE NEGATIVE Final   Influenza B by PCR NEGATIVE NEGATIVE Final    Comment: (NOTE) The Xpert Xpress SARS-CoV-2/FLU/RSV plus assay is intended as an aid in the diagnosis of influenza from Nasopharyngeal swab specimens and should not be used as a sole basis for treatment. Nasal washings and aspirates are unacceptable for Xpert Xpress SARS-CoV-2/FLU/RSV testing.  Fact Sheet for Patients: EntrepreneurPulse.com.au  Fact Sheet for Healthcare Providers: IncredibleEmployment.be  This test is not yet approved or cleared by the [place] FDA and has been authorized for detection and/or diagnosis of SARS-CoV-2 by FDA under an Emergency Use Authorization (EUA). This EUA will remain in effect (meaning this test can be used) for the duration of the COVID-19 declaration under Section 564(b)(1) of the Act, 21 U.S.C. section 360bbb-3(b)(1), unless the authorization is terminated or revoked.  Performed at [facility], [address], [place], [place] [address]      Labs: BNP (last 3 results) No results for input(s): BNP in the last 8760  hours. Basic Metabolic Panel: Recent Labs  Lab 11/21/21 2220 11/22/21 1836 11/23/21 0423 11/24/21 0401  NA 135  --  136 138  K 4.3  --  3.8 3.7  CL 99  --  101 105  CO2 23  --  25 24  GLUCOSE 159*  --  124* 108*  BUN 13  --  14 11  CREATININE 0.87 0.89 0.82 0.76  CALCIUM 10.9*  --  9.1  8.7*  MG  --   --   --  2.1   Liver Function Tests: Recent Labs  Lab 11/21/21 2220 11/23/21 0423  AST 41 32  ALT 28 38  ALKPHOS 117 107  BILITOT 0.8 0.8  PROT 8.6* 7.4  ALBUMIN 4.7 3.9   No results for input(s): LIPASE, AMYLASE in the last 168 hours. No results for input(s): AMMONIA in the last 168 hours. CBC: Recent Labs  Lab 11/21/21 2308 11/22/21 1836 11/23/21 0423 11/24/21 0401  WBC 10.9* 7.9 6.8 5.4  NEUTROABS 7.1  --   --   --   HGB 11.6* 11.4* 11.3* 10.6*  HCT 36.4 36.6 36.4 33.8*  MCV 83.5 86.7 86.9 85.6  PLT 326 271 260 293   Cardiac Enzymes: No results for input(s): CKTOTAL, CKMB, CKMBINDEX, TROPONINI in the last 168 hours. BNP: Invalid input(s): POCBNP CBG: No results for input(s): GLUCAP in the last 168 hours. D-Dimer No results for input(s): DDIMER in the last 72 hours. Hgb A1c No results for input(s): HGBA1C in the last 72 hours. Lipid Profile No results for input(s): CHOL, HDL, LDLCALC, TRIG, CHOLHDL, LDLDIRECT in the last 72 hours. Thyroid function studies No results for input(s): TSH, T4TOTAL, T3FREE, THYROIDAB in the last 72 hours.  Invalid input(s): FREET3 Anemia work up No results for input(s): VITAMINB12, FOLATE, FERRITIN, TIBC, IRON, RETICCTPCT in the last 72 hours. Urinalysis    Component Value Date/Time   COLORURINE YELLOW 11/21/2021 2308   APPEARANCEUR CLOUDY (A) 11/21/2021 2308   LABSPEC 1.018 11/21/2021 2308   PHURINE 5.5 11/21/2021 2308   GLUCOSEU 100 (A) 11/21/2021 2308   HGBUR SMALL (A) 11/21/2021 2308   BILIRUBINUR NEGATIVE 11/21/2021 2308   KETONESUR 15 (A) 11/21/2021 2308   PROTEINUR 30 (A) 11/21/2021 2308   UROBILINOGEN 0.2  04/14/2015 0300   NITRITE NEGATIVE 11/21/2021 2308   LEUKOCYTESUR LARGE (A) 11/21/2021 2308   Sepsis Labs Invalid input(s): PROCALCITONIN,  WBC,  LACTICIDVEN Microbiology Recent Results (from the past 240 hour(s))  Culture, blood (routine x 2)     Status: None (Preliminary result)   Collection Time: 11/21/21 11:00 PM   Specimen: BLOOD  Result Value Ref Range Status   Specimen Description   Final    BLOOD LEFT HAND Performed at [facility], [address], [place], [place] [address]    Special Requests   Final    BOTTLES DRAWN AEROBIC AND ANAEROBIC Blood Culture results may not be optimal due to an excessive volume of blood received in culture bottles Performed at [facility], [address], [place], [place] [address]    Culture   Final    NO GROWTH 2 DAYS Performed at [facility], [place] [address], [place], [place] [address]    Report Status PENDING  Incomplete  Urine Culture     Status: Abnormal   Collection Time: 11/21/21 11:08 PM   Specimen: Urine, Clean Catch  Result Value Ref Range Status   Specimen Description   Final    URINE, CLEAN CATCH Performed at [facility], [address], [place], [place] [address]    Special Requests   Final    NONE Performed at [facility], [address], [place], [place] [address]    Culture >=100,000 COLONIES/mL ESCHERICHIA COLI (A)  Final   Report Status 11/24/2021 FINAL  Final   Organism ID, Bacteria ESCHERICHIA COLI (A)  Final      Susceptibility   Escherichia coli - MIC*  AMPICILLIN >=32 RESISTANT Resistant     CEFAZOLIN <=4 SENSITIVE Sensitive     CEFEPIME <=0.12 SENSITIVE Sensitive     CEFTRIAXONE <=0.25 SENSITIVE Sensitive     CIPROFLOXACIN <=0.25 SENSITIVE Sensitive     GENTAMICIN <=1 SENSITIVE Sensitive     IMIPENEM <=0.25 SENSITIVE Sensitive     NITROFURANTOIN <=16 SENSITIVE Sensitive     TRIMETH/SULFA <=20 SENSITIVE Sensitive      AMPICILLIN/SULBACTAM 16 INTERMEDIATE Intermediate     PIP/TAZO <=4 SENSITIVE Sensitive     * >=100,000 COLONIES/mL ESCHERICHIA COLI  Culture, blood (routine x 2)     Status: None (Preliminary result)   Collection Time: 11/21/21 11:21 PM   Specimen: BLOOD  Result Value Ref Range Status   Specimen Description   Final    BLOOD RIGHT ANTECUBITAL Performed at [facility], [address] [place], [place] [address]    Special Requests   Final    BOTTLES DRAWN AEROBIC AND ANAEROBIC Blood Culture adequate volume Performed at [facility], [address], [place], [place] [address]    Culture   Final    NO GROWTH 2 DAYS Performed at [facility], [place] [address], [place],  [address]    Report Status PENDING  Incomplete  Resp Panel by RT-PCR (Flu A&B, Covid) Nasopharyngeal Swab     Status: None   Collection Time: 11/22/21 12:25 AM   Specimen: Nasopharyngeal Swab; Nasopharyngeal(NP) swabs in vial transport medium  Result Value Ref Range Status   SARS Coronavirus 2 by RT PCR NEGATIVE NEGATIVE Final    Comment: (NOTE) SARS-CoV-2 target nucleic acids are NOT DETECTED.  The SARS-CoV-2 RNA is generally detectable in upper respiratory specimens during the acute phase of infection. The lowest concentration of SARS-CoV-2 viral copies this assay can detect is 138 copies/mL. A negative result does not preclude SARS-Cov-2 infection and should not be used as the sole basis for treatment or other patient management decisions. A negative result may occur with  improper specimen collection/handling, submission of specimen other than nasopharyngeal swab, presence of viral mutation(s) within the areas targeted by this assay, and inadequate number of viral copies(<138 copies/mL). A negative result must be combined with clinical observations, patient history, and epidemiological information. The expected result is Negative.  Fact Sheet for Patients:   EntrepreneurPulse.com.au  Fact Sheet for Healthcare Providers:  IncredibleEmployment.be  This test is no t yet approved or cleared by the [place] FDA and  has been authorized for detection and/or diagnosis of SARS-CoV-2 by FDA under an Emergency Use Authorization (EUA). This EUA will remain  in effect (meaning this test can be used) for the duration of the COVID-19 declaration under Section 564(b)(1) of the Act, 21 U.S.C.section 360bbb-3(b)(1), unless the authorization is terminated  or revoked sooner.       Influenza A by PCR NEGATIVE NEGATIVE Final   Influenza B by PCR NEGATIVE NEGATIVE Final    Comment: (NOTE) The Xpert Xpress SARS-CoV-2/FLU/RSV plus assay is intended as an aid in the diagnosis of influenza from Nasopharyngeal swab specimens and should not be used as a sole basis for treatment. Nasal washings and aspirates are unacceptable for Xpert Xpress SARS-CoV-2/FLU/RSV testing.  Fact Sheet for Patients: EntrepreneurPulse.com.au  Fact Sheet for Healthcare Providers: IncredibleEmployment.be  This test is not yet approved or cleared by the [place] FDA and has been authorized for detection and/or diagnosis of SARS-CoV-2 by FDA under an Emergency Use Authorization (EUA). This EUA will remain in effect (meaning this test  can be used) for the duration of the COVID-19 declaration under Section 564(b)(1) of the Act, 21 U.S.C. section 360bbb-3(b)(1), unless the authorization is terminated or revoked.  Performed at [facility], [address] [place], [place] [address]      Time coordinating discharge:  I have spent 35 minutes face to face with the patient and on the ward discussing the patients care, assessment, plan and disposition with other care givers. >50% of the time was devoted counseling the patient about the risks and benefits of treatment/Discharge  disposition and coordinating care.   SIGNED:   Damita Lack, MD  [facility] 11/24/2021, 8:48 AM   If 7PM-7AM, please contact night-coverage

## 2021-11-27 LAB — CULTURE, BLOOD (ROUTINE X 2)
Culture: NO GROWTH
Culture: NO GROWTH
Special Requests: ADEQUATE

## 2022-01-05 ENCOUNTER — Other Ambulatory Visit: Payer: Self-pay

## 2022-01-05 ENCOUNTER — Encounter (INDEPENDENT_AMBULATORY_CARE_PROVIDER_SITE_OTHER): Payer: Self-pay | Admitting: Primary Care

## 2022-01-05 ENCOUNTER — Ambulatory Visit (INDEPENDENT_AMBULATORY_CARE_PROVIDER_SITE_OTHER): Payer: 59 | Admitting: Primary Care

## 2022-01-05 VITALS — BP 165/84 | HR 85 | Temp 97.9°F | Ht 60.0 in | Wt 161.0 lb

## 2022-01-05 DIAGNOSIS — Z131 Encounter for screening for diabetes mellitus: Secondary | ICD-10-CM | POA: Diagnosis not present

## 2022-01-05 DIAGNOSIS — G8929 Other chronic pain: Secondary | ICD-10-CM

## 2022-01-05 DIAGNOSIS — M329 Systemic lupus erythematosus, unspecified: Secondary | ICD-10-CM | POA: Diagnosis not present

## 2022-01-05 DIAGNOSIS — E119 Type 2 diabetes mellitus without complications: Secondary | ICD-10-CM

## 2022-01-05 DIAGNOSIS — R030 Elevated blood-pressure reading, without diagnosis of hypertension: Secondary | ICD-10-CM

## 2022-01-05 DIAGNOSIS — K420 Umbilical hernia with obstruction, without gangrene: Secondary | ICD-10-CM

## 2022-01-05 DIAGNOSIS — Z7689 Persons encountering health services in other specified circumstances: Secondary | ICD-10-CM

## 2022-01-05 DIAGNOSIS — Z09 Encounter for follow-up examination after completed treatment for conditions other than malignant neoplasm: Secondary | ICD-10-CM

## 2022-01-05 DIAGNOSIS — R109 Unspecified abdominal pain: Secondary | ICD-10-CM

## 2022-01-05 LAB — POCT GLYCOSYLATED HEMOGLOBIN (HGB A1C): Hemoglobin A1C: 9.1 % — AB (ref 4.0–5.6)

## 2022-01-05 MED ORDER — LISINOPRIL 10 MG PO TABS: 10.0000 mg | ORAL_TABLET | Freq: Every day | ORAL | 3 refills | Status: DC

## 2022-01-05 MED ORDER — METFORMIN HCL ER 500 MG PO TB24: 500.0000 mg | ORAL_TABLET | Freq: Two times a day (BID) | ORAL | 1 refills | Status: DC

## 2022-01-05 MED ORDER — TRULICITY 0.75 MG/0.5ML ~~LOC~~ SOAJ: 0.7500 mg | SUBCUTANEOUS | 4 refills | Status: DC

## 2022-01-05 NOTE — Patient Instructions (Signed)
Diabetes Mellitus and Nutrition, Adult °When you have diabetes, or diabetes mellitus, it is very important to have healthy eating habits because your blood sugar (glucose) levels are greatly affected by what you eat and drink. Eating healthy foods in the right amounts, at about the same times every day, can help you: °Manage your blood glucose. °Lower your risk of heart disease. °Improve your blood pressure. °Reach or maintain a healthy weight. °What can affect my meal plan? °Every person with diabetes is different, and each person has different needs for a meal plan. Your health care provider may recommend that you work with a dietitian to make a meal plan that is best for you. Your meal plan may vary depending on factors such as: °The calories you need. °The medicines you take. °Your weight. °Your blood glucose, blood pressure, and cholesterol levels. °Your activity level. °Other health conditions you have, such as heart or kidney disease. °How do carbohydrates affect me? °Carbohydrates, also called carbs, affect your blood glucose level more than any other type of food. Eating carbs raises the amount of glucose in your blood. °It is important to know how many carbs you can safely have in each meal. This is different for every person. Your dietitian can help you calculate how many carbs you should have at each meal and for each snack. °How does alcohol affect me? °Alcohol can cause a decrease in blood glucose (hypoglycemia), especially if you use insulin or take certain diabetes medicines by mouth. Hypoglycemia can be a life-threatening condition. Symptoms of hypoglycemia, such as sleepiness, dizziness, and confusion, are similar to symptoms of having too much alcohol. °Do not drink alcohol if: °Your health care provider tells you not to drink. °You are pregnant, may be pregnant, or are planning to become pregnant. °If you drink alcohol: °Limit how much you have to: °0-1 drink a day for women. °0-2 drinks a day  for men. °Know how much alcohol is in your drink. In the [place] one drink equals one 12 oz bottle of beer (355 mL), one 5 oz glass of wine (148 mL), or one 1½ oz glass of hard liquor (44 mL). °Keep yourself hydrated with water, diet soda, or unsweetened iced tea. Keep in mind that regular soda, juice, and other mixers may contain a lot of sugar and must be counted as carbs. °What are tips for following this plan? °Reading food labels °Start by checking the serving size on the Nutrition Facts label of packaged foods and drinks. The number of calories and the amount of carbs, fats, and other nutrients listed on the label are based on one serving of the item. Many items contain more than one serving per package. °Check the total grams (g) of carbs in one serving. °Check the number of grams of saturated fats and trans fats in one serving. Choose foods that have a low amount or none of these fats. °Check the number of milligrams (mg) of salt (sodium) in one serving. Most people should limit total sodium intake to less than 2,300 mg per day. °Always check the nutrition information of foods labeled as "low-fat" or "nonfat." These foods may be higher in added sugar or refined carbs and should be avoided. °Talk to your dietitian to identify your daily goals for nutrients listed on the label. °Shopping °Avoid buying canned, pre-made, or processed foods. These foods tend to be high in fat, sodium, and added sugar. °Shop around the outside edge of the grocery store. This is where you will   most often find fresh fruits and vegetables, bulk grains, fresh meats, and fresh dairy products. Cooking Use low-heat cooking methods, such as baking, instead of high-heat cooking methods, such as deep frying. Cook using healthy oils, such as olive, canola, or sunflower oil. Avoid cooking with butter, cream, or high-fat meats. Meal planning Eat meals and snacks regularly, preferably at the same times every day. Avoid going long periods of  time without eating. Eat foods that are high in fiber, such as fresh fruits, vegetables, beans, and whole grains. Eat 4-6 oz (112-168 g) of lean protein each day, such as lean meat, chicken, fish, eggs, or tofu. One ounce (oz) (28 g) of lean protein is equal to: 1 oz (28 g) of meat, chicken, or fish. 1 egg.  cup (62 g) of tofu. Eat some foods each day that contain healthy fats, such as avocado, nuts, seeds, and fish. What foods should I eat? Fruits Berries. Apples. Oranges. Peaches. Apricots. Plums. Grapes. Mangoes. Papayas. Pomegranates. Kiwi. Cherries. Vegetables Leafy greens, including lettuce, spinach, kale, chard, collard greens, mustard greens, and cabbage. Beets. Cauliflower. Broccoli. Carrots. Green beans. Tomatoes. Peppers. Onions. Cucumbers. Brussels sprouts. Grains Whole grains, such as whole-wheat or whole-grain bread, crackers, tortillas, cereal, and pasta. Unsweetened oatmeal. Quinoa. Brown or wild rice. Meats and other proteins Seafood. Poultry without skin. Lean cuts of poultry and beef. Tofu. Nuts. Seeds. Dairy Low-fat or fat-free dairy products such as milk, yogurt, and cheese. The items listed above may not be a complete list of foods and beverages you can eat and drink. Contact a dietitian for more information. What foods should I avoid? Fruits Fruits canned with syrup. Vegetables Canned vegetables. Frozen vegetables with butter or cream sauce. Grains Refined white flour and flour products such as bread, pasta, snack foods, and cereals. Avoid all processed foods. Meats and other proteins Fatty cuts of meat. Poultry with skin. Breaded or fried meats. Processed meat. Avoid saturated fats. Dairy Full-fat yogurt, cheese, or milk. Beverages Sweetened drinks, such as soda or iced tea. The items listed above may not be a complete list of foods and beverages you should avoid. Contact a dietitian for more information. Questions to ask a health care provider Do I need to  meet with a certified diabetes care and education specialist? Do I need to meet with a dietitian? What number can I call if I have questions? When are the best times to check my blood glucose? Where to find more information: American Diabetes Association: diabetes.org Academy of Nutrition and Dietetics: eatright.[employer] of Diabetes and Digestive and Kidney Diseases: [email or web address].si Association of Diabetes Care & Education Specialists: diabeteseducator.org Summary It is important to have healthy eating habits because your blood sugar (glucose) levels are greatly affected by what you eat and drink. It is important to use alcohol carefully. A healthy meal plan will help you manage your blood glucose and lower your risk of heart disease. Your health care provider may recommend that you work with a dietitian to make a meal plan that is best for you. This information is not intended to replace advice given to you by your health care provider. Make sure you discuss any questions you have with your health care provider. Document Revised: 07/03/2020 Document Reviewed: 07/03/2020 Elsevier Patient Education  2022 Elsevier Inc. Dulaglutide Injection What is this medication? DULAGLUTIDE (DOO la GLOO tide) treats type 2 diabetes. It works by increasing insulin levels in your body, which decreases your blood sugar (glucose). It also reduces the amount of sugar  released into your blood and slows down your digestion. It can also be used to lower the risk of heart attack and stroke in people with type 2 diabetes. Changes to diet and exercise are often combined with this medication. This medicine may be used for other purposes; ask your health care provider or pharmacist if you have questions. COMMON BRAND NAME(S): Trulicity What should I tell my care team before I take this medication? They need to know if you have any of these conditions: Endocrine tumors (MEN 2) or if someone in your family had  these tumors Eye disease, vision problems History of pancreatitis Kidney disease Liver disease Stomach or intestine problems Thyroid cancer or if someone in your family had thyroid cancer An unusual or allergic reaction to dulaglutide, other medications, foods, dyes, or preservatives Pregnant or trying to get pregnant Breast-feeding How should I use this medication? This medication is injected under the skin. You will be taught how to prepare and give it. Take it as directed on the prescription label on the same day of each week. Do NOT prime the pen. Keep taking it unless your care team tells you to stop. If you use this medication with insulin, you should inject this medication and the insulin separately. Do not mix them together. Do not give the injections right next to each other. Change (rotate) injection sites with each injection. This medication comes with INSTRUCTIONS FOR USE. Ask your pharmacist for directions on how to use this medication. Read the information carefully. Talk to your pharmacist or care team if you have questions. It is important that you put your used needles and syringes in a special sharps container. Do not put them in a trash can. If you do not have a sharps container, call your pharmacist or care team to get one. A special MedGuide will be given to you by the pharmacist with each prescription and refill. Be sure to read this information carefully each time. Talk to your care team about the use of this medication in children. Special care may be needed. Overdosage: If you think you have taken too much of this medicine contact a poison control center or emergency room at once. NOTE: This medicine is only for you. Do not share this medicine with others. What if I miss a dose? If you miss a dose, take it as soon as you can unless it is more than 3 days late. If it is more than 3 days late, skip the missed dose. Take the next dose at the normal time. What may interact  with this medication? Other medications for diabetes Many medications may cause changes in blood sugar, these include: Alcohol containing beverages Antiviral medications for HIV or AIDS Aspirin and aspirin-like medications Certain medications for blood pressure, heart disease, irregular heart beat Chromium Diuretics Female hormones, such as estrogens or progestins, birth control pills Fenofibrate Gemfibrozil Isoniazid Lanreotide Female hormones or anabolic steroids MAOIs like Carbex, Eldepryl, Marplan, Nardil, and Parnate Medications for allergies, asthma, cold, or cough Medications for depression, anxiety, or psychotic disturbances Medications for weight loss Niacin Nicotine NSAIDs, medications for pain and inflammation, like ibuprofen or naproxen Octreotide Pasireotide Pentamidine Phenytoin Probenecid Quinolone antibiotics such as ciprofloxacin, levofloxacin, ofloxacin Some herbal dietary supplements Steroid medications such as prednisone or cortisone Sulfamethoxazole; trimethoprim Thyroid hormones Some medications can hide the warning symptoms of low blood sugar (hypoglycemia). You may need to monitor your blood sugar more closely if you are taking one of these medications. These include: Beta-blockers,  often used for high blood pressure or heart problems (examples include atenolol, metoprolol, propranolol) Clonidine Guanethidine Reserpine This list may not describe all possible interactions. Give your health care provider a list of all the medicines, herbs, non-prescription drugs, or dietary supplements you use. Also tell them if you smoke, drink alcohol, or use illegal drugs. Some items may interact with your medicine. What should I watch for while using this medication? Visit your care team for regular checks on your progress. Check with your care team if you have severe diarrhea, nausea, and vomiting, or if you sweat a lot. The loss of too much body fluid may make it  dangerous for you to take this medication. A test called the HbA1C (A1C) will be monitored. This is a simple blood test. It measures your blood sugar control over the last 2 to 3 months. You will receive this test every 3 to 6 months. Learn how to check your blood sugar. Learn the symptoms of low and high blood sugar and how to manage them. Always carry a quick-source of sugar with you in case you have symptoms of low blood sugar. Examples include hard sugar candy or glucose tablets. Make sure others know that you can choke if you eat or drink when you develop serious symptoms of low blood sugar, such as seizures or unconsciousness. Get medical help at once. Tell your care team if you have high blood sugar. You might need to change the dose of your medication. If you are sick or exercising more than usual, you may need to change the dose of your medication. Do not skip meals. Ask your care team if you should avoid alcohol. Many nonprescription cough and cold products contain sugar or alcohol. These can affect blood sugar. Pens should never be shared. Even if the needle is changed, sharing may result in passing of viruses like hepatitis or HIV. Wear a medical ID bracelet or chain. Carry a card that describes your condition. List the medications and doses you take on the card. What side effects may I notice from receiving this medication? Side effects that you should report to your care team as soon as possible: Allergic reactions--skin rash, itching, hives, swelling of the face, lips, tongue, or throat Change in vision Dehydration--increased thirst, dry mouth, feeling faint or lightheaded, headache, dark yellow or brown urine Kidney injury--decrease in the amount of urine, swelling of the ankles, hands, or feet Pancreatitis--severe stomach pain that spreads to your back or gets worse after eating or when touched, fever, nausea, vomiting Thyroid cancer--new mass or lump in the neck, pain or trouble  swallowing, trouble breathing, hoarseness Side effects that usually do not require medical attention (report to your care team if they continue or are bothersome): Diarrhea Loss of appetite Nausea Stomach pain Vomiting This list may not describe all possible side effects. Call your doctor for medical advice about side effects. You may report side effects to FDA at 1-800-FDA-1088. Where should I keep my medication? Keep out of the reach of children and pets. Refrigeration (preferred): Store unopened pens in a refrigerator between 2 and 8 degrees C (36 and 46 degrees F). Keep it in the original carton until you are ready to take it. Do not freeze or use if the medication has been frozen. Protect from light. Get rid of any unused medication after the expiration date on the label. Room Temperature: The pen may be stored at room temperature below 30 degrees C (86 degrees F) for up to  a total of 14 days if needed. Protect from light. Avoid exposure to extreme heat. If it is stored at room temperature, throw away any unused medication after 14 days or after it expires, whichever is first. To get rid of medications that are no longer needed or have expired: Take the medication to a medication take-back program. Check with your pharmacy or law enforcement to find a location. If you cannot return the medication, ask your pharmacist or care team how to get rid of this medication safely. NOTE: This sheet is a summary. It may not cover all possible information. If you have questions about this medicine, talk to your doctor, pharmacist, or health care provider.  2022 Elsevier/Gold Standard (2021-03-06 00:00:00)

## 2022-01-05 NOTE — Progress Notes (Signed)
[facility]   Subjective:  [name] is a 51 y.o. obese female presents for hospital follow up and establish care. Patient went to ED with abdominal pain for 2 weeks ago and hematuria.Previously seen in UC and tx . Symptoms did not resolve.  She was admit date to the hospital was 11/21/21, patient was discharged from the hospital on 11/24/21, patient was admitted for: Pyelonephritis and.Acute exacerbation of CHF.  Blood pressure today is elevated at 165/84 reviewed previous blood pressures and they were significantly lower.  She still is in acute pain changing positions on the exam table frequently.  Discussed elevated blood pressure monitoring foods, exercising and changing diet. Denies shortness of breath, headaches, chest pain or lower extremity edema. She is experiencing abdominal pain, nausea without vomiting and back pain on the left side.  During her hospital visit she was informed by the urologist she had a cyst on her left kidney. (5.2 cm interpolar left renal cyst (series 2/image 26). Right kidney within normal limits. No renal calculus or hydronephrosis.)  She also has a diagnosis of lupus since 2007 and will be referred to a rheumatologist. Past Medical History:  Diagnosis Date   DVT (deep venous thrombosis) ([place])    right arm   Endometriosis    Lupus (systemic lupus erythematosus) ([place]) 2007   Migraine    Seizures ([place])    last sz 06/2013, no meds     Allergies  Allergen Reactions   Bee Venom Anaphylaxis   Diamox [Acetazolamide] Anaphylaxis   Nsaids Anaphylaxis   Omnipaque [Iohexol] Anaphylaxis   Tolmetin Anaphylaxis   Compazine [Prochlorperazine] Hives   Iopamidol Hives   Lactose Intolerance (Gi) Nausea And Vomiting   Reglan [Metoclopramide] Hives      Current Outpatient Medications on File Prior to Visit  Medication Sig Dispense Refill   cholecalciferol (VITAMIN D) 1000 units tablet Take 1,000 Units by mouth daily.     vitamin B-12  (CYANOCOBALAMIN) 1000 MCG tablet Take 1,000 mcg by mouth daily.     acetaminophen (TYLENOL) 500 MG tablet Take 500 mg by mouth every 6 (six) hours as needed for moderate pain.     blood glucose meter kit and supplies KIT Dispense based on patient and insurance preference. Use up to four times daily as directed. (FOR ICD-9 250.00, 250.01). (Patient not taking: Reported on 01/05/2022) 1 each 0   EPINEPHrine (EPIPEN 2-PAK) 0.3 mg/0.3 mL IJ SOAJ injection Inject 0.3 mLs (0.3 mg total) into the muscle once as needed (for severe allergic reaction). (Patient taking differently: Inject 0.3 mg into the muscle once as needed for anaphylaxis.) 1 each 1   OVER THE COUNTER MEDICATION Take 2 tablets by mouth daily. Amberen     [DISCONTINUED] dicyclomine (BENTYL) 20 MG tablet Take 1 tablet (20 mg total) by mouth 2 (two) times daily as needed for spasms. 20 tablet 0   [DISCONTINUED] famotidine (PEPCID) 20 MG tablet Take 1 tablet (20 mg total) by mouth 2 (two) times daily. 30 tablet 0   [DISCONTINUED] ipratropium-albuterol (DUONEB) 0.5-2.5 (3) MG/3ML SOLN Take 3 mLs by nebulization every 4 (four) hours as needed. (Patient not taking: Reported on 09/22/2018) 360 mL 0   [DISCONTINUED] omeprazole (PRILOSEC) 20 MG capsule Take 1 capsule (20 mg total) by mouth 2 (two) times daily before a meal. 28 capsule 0   [DISCONTINUED] promethazine (PHENERGAN) 25 MG suppository Place 1 suppository (25 mg total) rectally every 6 (six) hours as needed for nausea or vomiting. 12 each 3   [  DISCONTINUED] sucralfate (CARAFATE) 1 g tablet Take 1 tablet (1 g total) by mouth 4 (four) times daily -  with meals and at bedtime. (Patient not taking: Reported on 06/06/2019) 30 tablet 0   No current facility-administered medications on file prior to visit.   Review of System: Comprehensive ROS Pertinent positive and negative noted in HPI    Objective:  BP (!) 165/84 (BP Location: Right Arm, Patient Position: Sitting, Cuff Size: Normal)    Pulse 85     Temp 97.9 F (36.6 C) (Oral)    Ht 5' (1.524 m)    Wt 161 lb (73 kg)    LMP 06/14/2003    SpO2 99%    BMI 31.44 kg/m   Filed Weights   01/05/22 1447  Weight: 161 lb (73 kg)   Physical Exam: General Appearance: Well nourished, in mild distress. Eyes: PERRLA, EOMs, conjunctiva no swelling or erythema Sinuses: No Frontal/maxillary tenderness ENT/Mouth: Ext aud canals clear, TMs without erythema, bulging. Hearing normal.  Neck: Supple, thyroid normal.  Respiratory: Respiratory effort normal, BS equal bilaterally without rales, rhonchi, wheezing or stridor.  Cardio: RRR with no MRGs. Brisk peripheral pulses without edema.  Abdomen: Soft, + BS.  Non tender, no guarding, rebound,umbilical  hernias-  Lymphatics: Non tender without lymphadenopathy.  Musculoskeletal: Full ROM, 5/5 strength, normal gait.  Skin: Warm, dry without rashes, lesions, ecchymosis.  Neuro: Cranial nerves intact. Normal muscle tone, no cerebellar symptoms. Sensation intact.  Psych: Awake and oriented X 3, normal affect, Insight and Judgment appropriate.    Assessment:  Telissa was seen today for hospitalization follow-up.  Diagnoses and all orders for this visit:  Screening for diabetes mellitus -     HgB A1c 9.1  Type 2 diabetes mellitus without complication, without long-term current use of insulin (HCC)  Goal of therapy: Less than 6.5 hemoglobin A1c.  Reference clinical practice recommendations. Monitor Foods that are high in carbohydrates are the following rice, potatoes, breads, sugars, and pastas.  Reduction in the intake (eating) will assist in lowering your blood sugars.  -     Dulaglutide (TRULICITY) 2.80 KL/4.9ZP SOPN; Inject 0.75 mg into the skin once a week. Demonstrated how to use and return demonstration . -     metFORMIN (GLUCOPHAGE XR) 500 MG 24 hr tablet; Take 1 tablet (500 mg total) by mouth 2 (two) times daily before a meal.  Hospital discharge follow-up Follow-Ups: Schedule an appointment with  [facility] in 1 week (12/01/2021)- seen 01/05/22  Lupus ([place]) -     Ambulatory referral to Rheumatology  Elevated blood-pressure reading without diagnosis of hypertension Counseled on blood pressure goal of less than 130/80, low-sodium, DASH diet, medication compliance, 150 minutes of moderate intensity exercise per week.  Chronic abdominal pain (5.2 cm interpolar left renal cyst (series 2/image 26). Right kidney Ambulatory Urology   Encounter to establish care Establish care with PCP   Umbilical hernia with obstruction, without gangrene    This note has been created with Dragon speech recognition software and smart phrase technology. Any transcriptional errors are unintentional.   Kerin Perna, NP 01/05/2022, 3:01 PM

## 2022-01-12 NOTE — Progress Notes (Signed)
01/13/22 12:16 PM   Donna [name] [birth year] [id number]  Referring provider:  Grayce Sessions, NP [address] [place],  [place] [address] Chief Complaint  Patient presents with   Hospitalization Follow-up     HPI: Donna [name] is a 51 y.o.female who presents today for further evaluation of LLQ and left lower back pain.   She was seen in the ED on 11/21/2021 with abdominal pain. She went to urgent care a was diagnosed with a UTI and treated with Vantin. CT renal study did not show stones, it revealed a 5.2 cm interpolar left renal cyst . UA was concerning for UTI. Urine culture grew E.coli resistant to ampicillin and only intermediately sensitive to Unasyn.  She was started on cefepime. She was diagnosed with pyelonephritis and admitted for 2 days.  She was discharged home with 10 days of p.o. Levaquin.  Per hospital documentation, she had some right CVA tenderness and thus was felt to have a clinically diagnosed right pyelonephritis.  She has been recently diagnosed with diabetes.   She reports that she has been experiencing left quadrant pain and left lower back pain and believes it is because of the renal cyst.  She very specifically reports that ever since this infection starting a few days before she went to the ER, she developed left flank pain associated with a full bladder which became more acutely severe with voiding lasting anywhere from 15 to 20 minutes after voiding and then resolves.  This is unchanged with treatment of her UTI.  She had resolution of her dysuria but continues to have this ongoing issue.. She denies any fevers and chills.   She has no history of kidney infections before this.   She reports that she has a rash/vaginal irritation since being on antibiotics.  He does have a personal history of lupus on no meds.  She also has a personal Struve endometriosis.   PMH: Past Medical History:  Diagnosis Date   DVT (deep venous thrombosis) (HCC)     right arm   Endometriosis    Lupus (systemic lupus erythematosus) (HCC) 2007   Migraine    Seizures (HCC)    last sz 06/2013, no meds    Surgical History: Past Surgical History:  Procedure Laterality Date   ABDOMINAL HYSTERECTOMY     x2 partial   CESAREAN SECTION     x3   MASS EXCISION  02/2014   abd    Home Medications:  Allergies as of 01/13/2022       Reactions   Bee Venom Anaphylaxis   Diamox [acetazolamide] Anaphylaxis   Nsaids Anaphylaxis   Omnipaque [iohexol] Anaphylaxis   Tolmetin Anaphylaxis   Compazine [prochlorperazine] Hives   Iopamidol Hives   Lactose Intolerance (gi) Nausea And Vomiting   Reglan [metoclopramide] Hives        Medication List        Accurate as of January 13, 2022 12:16 PM. If you have any questions, ask your nurse or doctor.          acetaminophen 500 MG tablet Commonly known as: TYLENOL Take 500 mg by mouth every 6 (six) hours as needed for moderate pain.   blood glucose meter kit and supplies Kit Dispense based on patient and insurance preference. Use up to four times daily as directed. (FOR ICD-9 250.00, 250.01).   cholecalciferol 1000 units tablet Commonly known as: VITAMIN D Take 1,000 Units by mouth daily.   EPINEPHrine 0.3 mg/0.3 mL Soaj injection Commonly  known as: EpiPen 2-Pak Inject 0.3 mLs (0.3 mg total) into the muscle once as needed (for severe allergic reaction). What changed: reasons to take this   fluconazole 150 MG tablet Commonly known as: DIFLUCAN Take 1 tablet (150 mg total) by mouth once for 1 dose. Started by: Hollice Espy, MD   lisinopril 10 MG tablet Commonly known as: ZESTRIL Take 1 tablet (10 mg total) by mouth daily.   metFORMIN 500 MG 24 hr tablet Commonly known as: Glucophage XR Take 1 tablet (500 mg total) by mouth 2 (two) times daily before a meal.   OVER THE COUNTER MEDICATION Take 2 tablets by mouth daily. Amberen   Trulicity 2.95 JO/8.4ZY Sopn Generic drug: Dulaglutide Inject  0.75 mg into the skin once a week.   vitamin B-12 1000 MCG tablet Commonly known as: CYANOCOBALAMIN Take 1,000 mcg by mouth daily.        Allergies:  Allergies  Allergen Reactions   Bee Venom Anaphylaxis   Diamox [Acetazolamide] Anaphylaxis   Nsaids Anaphylaxis   Omnipaque [Iohexol] Anaphylaxis   Tolmetin Anaphylaxis   Compazine [Prochlorperazine] Hives   Iopamidol Hives   Lactose Intolerance (Gi) Nausea And Vomiting   Reglan [Metoclopramide] Hives    Family History: Family History  Problem Relation Age of Onset   CVA Mother 68   Alzheimer's disease Father    Breast cancer Sister    Ovarian cancer Sister    Stomach cancer Sister    Breast cancer Maternal Aunt    Ovarian cancer Maternal Aunt    Stomach cancer Maternal Aunt    Birth defects Maternal Uncle    Colon cancer Neg Hx    Esophageal cancer Neg Hx    Pancreatic cancer Neg Hx     Social History:  reports that she has never smoked. She has never used smokeless tobacco. She reports that she does not drink alcohol and does not use drugs.   Physical Exam: BP (!) 161/98    Pulse (!) 108    Ht 5' (1.524 m)    Wt 161 lb (73 kg)    LMP 06/14/2003    BMI 31.44 kg/m   Constitutional:  Alert and oriented, appears uncomfortable HEENT: [place] AT, moist mucus membranes.  Trachea midline, no masses. Cardiovascular: No clubbing, cyanosis, or edema. Respiratory: Normal respiratory effort, no increased work of breathing. Skin: No rashes, bruises or suspicious lesions. Neurologic: Grossly intact, no focal deficits, moving all 4 extremities. Psychiatric: Normal mood and affect.  Laboratory Data:  Lab Results  Component Value Date   CREATININE 0.76 11/24/2021   Lab Results  Component Value Date   HGBA1C 9.1 (A) 01/05/2022    Urinalysis Negative 1+ glucose   Pertinent Imaging: CLINICAL DATA:  Right lower quadrant pain, fever   EXAM: CT ABDOMEN AND PELVIS WITHOUT CONTRAST   TECHNIQUE: Multidetector CT imaging of  the abdomen and pelvis was performed following the standard protocol without IV contrast.   COMPARISON:  03/20/2021   FINDINGS: Lower chest: Lung bases are clear.   Hepatobiliary: Unenhanced liver is unremarkable.   Gallbladder is unremarkable. No intrahepatic or extrahepatic duct dilatation.   Pancreas: Within normal limits.   Spleen: Within normal limits.   Adrenals/Urinary Tract: Adrenal glands are within normal limits.   5.2 cm interpolar left renal cyst (series 2/image 26). Right kidney within normal limits. No renal calculus or hydronephrosis.   Bladder is within normal limits.   Stomach/Bowel: Stomach is within normal limits.   No evidence of bowel obstruction.  Normal appendix (series 2/image 85).   No colonic wall thickening or inflammatory changes.   Vascular/Lymphatic: No evidence of abdominal aortic aneurysm.   No suspicious abdominopelvic lymphadenopathy.   Reproductive: Status post hysterectomy.   No adnexal masses.   Other: No abdominopelvic ascites.   Musculoskeletal: Visualized osseous structures are within normal limits.   IMPRESSION: No evidence of bowel obstruction.  Normal appendix.   5.2 cm interpolar left renal cyst, benign.   No CT findings to account for the patient's right lower quadrant abdominal pain.     Electronically Signed   By: Julian Hy M.D.   On: 11/22/2021 01:31  The above CT scan was personally reviewed.  There is no stranding around the kidney or any other evidence of true pyelonephritis.  Review of serial imaging dating back pre-2000 and indicates a slowly enlarging but simple left renal cyst, Bosniak 1.  Assessment & Plan:     Left flank pain - Etiology sumewhat unclear and in light of pain will further evaluate with STAT CT abdomen with contrast to rule out infection of cyst.  Differential includes infected cyst versus occult abscess.  If this scan is negative, may not be renal in origin.  Unlikely that  the pain is related to simple renal cyst given its chronicity and relatively small size comparatively. -Will call her with results. Even if scan is negative will treat with imperic cipro  - Discussed pretreatment before CT due to her allergy to contrast with benadryl  - CBC drawn today  - Urinalysis not concerning for infection. Sent urine for culture to rule out infection.   2.Renal cyst  - CT showed consistent with benign cyst. - No further follow-up or intervention neede  3. Rash on vagina/ burning/ itching  - Likely due to yeast secondary to recent infection and diagnosis of diabetes  - Give diflucan 150 mg po x 1, repeat x 1 if continues today resumed vaginal Candidal infection -f/u with PCP if fails to result    I,Kailey Littlejohn,acting as a scribe for Hollice Espy, MD.,have documented all relevant documentation on the behalf of Hollice Espy, MD,as directed by  Hollice Espy, MD while in the presence of Hollice Espy, MD.  I have reviewed the above documentation for accuracy and completeness, and I agree with the above.   Hollice Espy, MD   [facility] [address] [place], [place] [address] [phone]

## 2022-01-13 ENCOUNTER — Ambulatory Visit (INDEPENDENT_AMBULATORY_CARE_PROVIDER_SITE_OTHER): Payer: 59 | Admitting: Urology

## 2022-01-13 ENCOUNTER — Other Ambulatory Visit: Payer: Self-pay

## 2022-01-13 ENCOUNTER — Other Ambulatory Visit
Admission: RE | Admit: 2022-01-13 | Discharge: 2022-01-13 | Disposition: A | Discharge status: Home / Self Care | Payer: 59 | Attending: Urology | Admitting: Urology

## 2022-01-13 ENCOUNTER — Encounter: Payer: Self-pay | Admitting: Urology

## 2022-01-13 VITALS — BP 161/98 | HR 108 | Ht 60.0 in | Wt 161.0 lb

## 2022-01-13 DIAGNOSIS — N390 Urinary tract infection, site not specified: Secondary | ICD-10-CM

## 2022-01-13 DIAGNOSIS — R109 Unspecified abdominal pain: Secondary | ICD-10-CM

## 2022-01-13 LAB — CBC WITH DIFFERENTIAL/PLATELET
Abs Immature Granulocytes: 0.01 10*3/uL (ref 0.00–0.07)
Basophils Absolute: 0 10*3/uL (ref 0.0–0.1)
Basophils Relative: 1 %
Eosinophils Absolute: 0.1 10*3/uL (ref 0.0–0.5)
Eosinophils Relative: 1 %
HCT: 38.8 % (ref 36.0–46.0)
Hemoglobin: 12 g/dL (ref 12.0–15.0)
Immature Granulocytes: 0 %
Lymphocytes Relative: 38 %
Lymphs Abs: 2.2 10*3/uL (ref 0.7–4.0)
MCH: 26.7 pg (ref 26.0–34.0)
MCHC: 30.9 g/dL (ref 30.0–36.0)
MCV: 86.4 fL (ref 80.0–100.0)
Monocytes Absolute: 0.4 10*3/uL (ref 0.1–1.0)
Monocytes Relative: 7 %
Neutro Abs: 3.1 10*3/uL (ref 1.7–7.7)
Neutrophils Relative %: 53 %
Platelets: 362 10*3/uL (ref 150–400)
RBC: 4.49 MIL/uL (ref 3.87–5.11)
RDW: 13.3 % (ref 11.5–15.5)
WBC: 5.8 10*3/uL (ref 4.0–10.5)
nRBC: 0 % (ref 0.0–0.2)

## 2022-01-13 LAB — URINALYSIS, COMPLETE
Bilirubin, UA: NEGATIVE
Ketones, UA: NEGATIVE
Leukocytes,UA: NEGATIVE
Nitrite, UA: NEGATIVE
Protein,UA: NEGATIVE
RBC, UA: NEGATIVE
Specific Gravity, UA: >1.03 — ABNORMAL HIGH (ref 1.005–1.030)
Urobilinogen, Ur: 0.2 mg/dL (ref 0.2–1.0)
pH, UA: 5.5 (ref 5.0–7.5)

## 2022-01-13 LAB — MICROSCOPIC EXAMINATION

## 2022-01-13 MED ORDER — FLUCONAZOLE 150 MG PO TABS
150.0000 mg | ORAL_TABLET | Freq: Once | ORAL | 1 refills | Status: AC
Start: 2022-01-13 — End: 2022-01-13

## 2022-01-13 MED ORDER — DIPHENHYDRAMINE HCL 50 MG PO TABS: ORAL_TABLET | ORAL | 0 refills | Status: DC

## 2022-01-13 MED ORDER — FAMOTIDINE 40 MG PO TABS: ORAL_TABLET | ORAL | 0 refills | Status: DC

## 2022-01-13 MED ORDER — PREDNISONE 50 MG PO TABS: ORAL_TABLET | ORAL | 0 refills | Status: DC

## 2022-01-16 ENCOUNTER — Telehealth: Payer: Self-pay | Admitting: *Deleted

## 2022-01-16 NOTE — Telephone Encounter (Addendum)
Patient informed, voiced understanding.    ----- Message from Vanna Scotland, MD sent at 01/13/2022  2:09 PM EST ----- Blood work looks fine which is reassuring  Vanna Scotland, MD

## 2022-01-17 ENCOUNTER — Other Ambulatory Visit (INDEPENDENT_AMBULATORY_CARE_PROVIDER_SITE_OTHER): Payer: Self-pay | Admitting: Primary Care

## 2022-01-17 DIAGNOSIS — E119 Type 2 diabetes mellitus without complications: Secondary | ICD-10-CM

## 2022-01-17 LAB — CULTURE, URINE COMPREHENSIVE

## 2022-01-19 ENCOUNTER — Telehealth: Payer: Self-pay | Admitting: *Deleted

## 2022-01-19 MED ORDER — NITROFURANTOIN MONOHYD MACRO 100 MG PO CAPS: 100.0000 mg | ORAL_CAPSULE | Freq: Two times a day (BID) | ORAL | 0 refills | Status: DC

## 2022-01-19 NOTE — Telephone Encounter (Addendum)
Left patient a VM, sent in RX to pharmacy.    ----- Message from Vanna Scotland, MD sent at 01/19/2022 12:37 PM EST ----- Urine culture ended up growing Enterococcus.  Please treat with nitrofurantoin twice daily for 10 days.  Vanna Scotland, MD

## 2022-01-19 NOTE — Telephone Encounter (Signed)
Pharmacy requesting refill.

## 2022-01-23 ENCOUNTER — Ambulatory Visit: Payer: 59

## 2022-02-02 ENCOUNTER — Ambulatory Visit: Payer: 59 | Admitting: Pharmacist
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
## 2022-03-23 ENCOUNTER — Ambulatory Visit: Payer: 59 | Admitting: Internal Medicine

## 2022-04-06 ENCOUNTER — Ambulatory Visit (INDEPENDENT_AMBULATORY_CARE_PROVIDER_SITE_OTHER): Payer: 59 | Admitting: Primary Care

## 2022-04-06 ENCOUNTER — Encounter (INDEPENDENT_AMBULATORY_CARE_PROVIDER_SITE_OTHER): Payer: Self-pay | Admitting: Primary Care

## 2022-04-06 VITALS — BP 129/86 | HR 77 | Temp 97.7°F | Ht 60.0 in | Wt 152.8 lb

## 2022-04-06 DIAGNOSIS — I10 Essential (primary) hypertension: Secondary | ICD-10-CM | POA: Diagnosis not present

## 2022-04-06 DIAGNOSIS — E119 Type 2 diabetes mellitus without complications: Secondary | ICD-10-CM

## 2022-04-06 DIAGNOSIS — M5440 Lumbago with sciatica, unspecified side: Secondary | ICD-10-CM

## 2022-04-06 LAB — POCT GLYCOSYLATED HEMOGLOBIN (HGB A1C): Hemoglobin A1C: 7.3 % — AB (ref 4.0–5.6)

## 2022-04-06 MED ORDER — TRULICITY 1.5 MG/0.5ML ~~LOC~~ SOAJ
1.5000 mg | SUBCUTANEOUS | 4 refills | Status: DC
Start: 2022-04-06 — End: 2023-01-18
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
## 2022-04-06 NOTE — Patient Instructions (Signed)
Acute Back Pain, Adult Acute back pain is sudden and usually short-lived. It is often caused by an injury to the muscles and tissues in the back. The injury may result from: A muscle, tendon, or ligament getting overstretched or torn. Ligaments are tissues that connect bones to each other. Lifting something improperly can cause a back strain. Wear and tear (degeneration) of the spinal disks. Spinal disks are circular tissue that provide cushioning between the bones of the spine (vertebrae). Twisting motions, such as while playing sports or doing yard work. A hit to the back. Arthritis. You may have a physical exam, lab tests, and imaging tests to find the cause of your pain. Acute back pain usually goes away with rest and home care. Follow these instructions at home: Managing pain, stiffness, and swelling Take over-the-counter and prescription medicines only as told by your health care provider. Treatment may include medicines for pain and inflammation that are taken by mouth or applied to the skin, or muscle relaxants. Your health care provider may recommend applying ice during the first 24-48 hours after your pain starts. To do this: Put ice in a plastic bag. Place a towel between your skin and the bag. Leave the ice on for 20 minutes, 2-3 times a day. Remove the ice if your skin turns bright red. This is very important. If you cannot feel pain, heat, or cold, you have a greater risk of damage to the area. If directed, apply heat to the affected area as often as told by your health care provider. Use the heat source that your health care provider recommends, such as a moist heat pack or a heating pad. Place a towel between your skin and the heat source. Leave the heat on for 20-30 minutes. Remove the heat if your skin turns bright red. This is especially important if you are unable to feel pain, heat, or cold. You have a greater risk of getting burned. Activity  Do not stay in bed. Staying in  bed for more than 1-2 days can delay your recovery. Sit up and stand up straight. Avoid leaning forward when you sit or hunching over when you stand. If you work at a desk, sit close to it so you do not need to lean over. Keep your chin tucked in. Keep your neck drawn back, and keep your elbows bent at a 90-degree angle (right angle). Sit high and close to the steering wheel when you drive. Add lower back (lumbar) support to your car seat, if needed. Take short walks on even surfaces as soon as you are able. Try to increase the length of time you walk each day. Do not sit, drive, or stand in one place for more than 30 minutes at a time. Sitting or standing for long periods of time can put stress on your back. Do not drive or use heavy machinery while taking prescription pain medicine. Use proper lifting techniques. When you bend and lift, use positions that put less stress on your back: Bend your knees. Keep the load close to your body. Avoid twisting. Exercise regularly as told by your health care provider. Exercising helps your back heal faster and helps prevent back injuries by keeping muscles strong and flexible. Work with a physical therapist to make a safe exercise program, as recommended by your health care provider. Do any exercises as told by your physical therapist. Lifestyle Maintain a healthy weight. Extra weight puts stress on your back and makes it difficult to have good   posture. Avoid activities or situations that make you feel anxious or stressed. Stress and anxiety increase muscle tension and can make back pain worse. Learn ways to manage anxiety and stress, such as through exercise. General instructions Sleep on a firm mattress in a comfortable position. Try lying on your side with your knees slightly bent. If you lie on your back, put a pillow under your knees. Keep your head and neck in a straight line with your spine (neutral position) when using electronic equipment like  smartphones or pads. To do this: Raise your smartphone or pad to look at it instead of bending your head or neck to look down. Put the smartphone or pad at the level of your face while looking at the screen. Follow your treatment plan as told by your health care provider. This may include: Cognitive or behavioral therapy. Acupuncture or massage therapy. Meditation or yoga. Contact a health care provider if: You have pain that is not relieved with rest or medicine. You have increasing pain going down into your legs or buttocks. Your pain does not improve after 2 weeks. You have pain at night. You lose weight without trying. You have a fever or chills. You develop nausea or vomiting. You develop abdominal pain. Get help right away if: You develop new bowel or bladder control problems. You have unusual weakness or numbness in your arms or legs. You feel faint. These symptoms may represent a serious problem that is an emergency. Do not wait to see if the symptoms will go away. Get medical help right away. Call your local emergency services (911 in the [place] Do not drive yourself to the hospital. Summary Acute back pain is sudden and usually short-lived. Use proper lifting techniques. When you bend and lift, use positions that put less stress on your back. Take over-the-counter and prescription medicines only as told by your health care provider, and apply heat or ice as told. This information is not intended to replace advice given to you by your health care provider. Make sure you discuss any questions you have with your health care provider. Document Revised: 02/21/2021 Document Reviewed: 02/21/2021 Elsevier Patient Education  2023 Elsevier Inc.  

## 2022-05-03 ENCOUNTER — Encounter (HOSPITAL_BASED_OUTPATIENT_CLINIC_OR_DEPARTMENT_OTHER): Payer: Self-pay | Admitting: Emergency Medicine

## 2022-05-03 ENCOUNTER — Emergency Department (HOSPITAL_BASED_OUTPATIENT_CLINIC_OR_DEPARTMENT_OTHER): Payer: 59 | Admitting: Radiology

## 2022-05-03 ENCOUNTER — Emergency Department (HOSPITAL_BASED_OUTPATIENT_CLINIC_OR_DEPARTMENT_OTHER): Payer: 59

## 2022-05-03 ENCOUNTER — Emergency Department (HOSPITAL_BASED_OUTPATIENT_CLINIC_OR_DEPARTMENT_OTHER)
Admission: EM | Admit: 2022-05-03 | Discharge: 2022-05-03 | Disposition: A | Discharge status: Home / Self Care | Payer: 59 | Attending: Emergency Medicine | Admitting: Emergency Medicine

## 2022-05-03 ENCOUNTER — Other Ambulatory Visit: Payer: Self-pay

## 2022-05-03 DIAGNOSIS — R1110 Vomiting, unspecified: Secondary | ICD-10-CM | POA: Insufficient documentation

## 2022-05-03 DIAGNOSIS — Z20822 Contact with and (suspected) exposure to covid-19: Secondary | ICD-10-CM | POA: Insufficient documentation

## 2022-05-03 DIAGNOSIS — R059 Cough, unspecified: Secondary | ICD-10-CM | POA: Insufficient documentation

## 2022-05-03 DIAGNOSIS — Z79899 Other long term (current) drug therapy: Secondary | ICD-10-CM | POA: Insufficient documentation

## 2022-05-03 DIAGNOSIS — R1012 Left upper quadrant pain: Secondary | ICD-10-CM | POA: Diagnosis present

## 2022-05-03 DIAGNOSIS — N390 Urinary tract infection, site not specified: Secondary | ICD-10-CM | POA: Insufficient documentation

## 2022-05-03 LAB — COMPREHENSIVE METABOLIC PANEL
ALT: 21 U/L (ref 0–44)
AST: 23 U/L (ref 15–41)
Albumin: 5 g/dL (ref 3.5–5.0)
Alkaline Phosphatase: 98 U/L (ref 38–126)
Anion gap: 10 (ref 5–15)
BUN: 16 mg/dL (ref 6–20)
CO2: 28 mmol/L (ref 22–32)
Calcium: 10.2 mg/dL (ref 8.9–10.3)
Chloride: 102 mmol/L (ref 98–111)
Creatinine, Ser: 0.83 mg/dL (ref 0.44–1.00)
GFR, Estimated: >60 mL/min (ref >60)
Glucose, Bld: 82 mg/dL (ref 70–99)
Potassium: 3.6 mmol/L (ref 3.5–5.1)
Sodium: 140 mmol/L (ref 135–145)
Total Bilirubin: 0.5 mg/dL (ref 0.3–1.2)
Total Protein: 8.7 g/dL — ABNORMAL HIGH (ref 6.5–8.1)

## 2022-05-03 LAB — URINALYSIS, ROUTINE W REFLEX MICROSCOPIC
Bilirubin Urine: NEGATIVE
Glucose, UA: NEGATIVE mg/dL
Hgb urine dipstick: NEGATIVE
Ketones, ur: 15 mg/dL — AB
Nitrite: NEGATIVE
Protein, ur: NEGATIVE mg/dL
Specific Gravity, Urine: 1.017 (ref 1.005–1.030)
pH: 6 (ref 5.0–8.0)

## 2022-05-03 LAB — CBC WITH DIFFERENTIAL/PLATELET
Abs Immature Granulocytes: 0.03 10*3/uL (ref 0.00–0.07)
Basophils Absolute: 0.1 10*3/uL (ref 0.0–0.1)
Basophils Relative: 1 %
Eosinophils Absolute: 0.2 10*3/uL (ref 0.0–0.5)
Eosinophils Relative: 1 %
HCT: 37.6 % (ref 36.0–46.0)
Hemoglobin: 11.6 g/dL — ABNORMAL LOW (ref 12.0–15.0)
Immature Granulocytes: 0 %
Lymphocytes Relative: 31 %
Lymphs Abs: 3.3 10*3/uL (ref 0.7–4.0)
MCH: 26.1 pg (ref 26.0–34.0)
MCHC: 30.9 g/dL (ref 30.0–36.0)
MCV: 84.7 fL (ref 80.0–100.0)
Monocytes Absolute: 0.6 10*3/uL (ref 0.1–1.0)
Monocytes Relative: 6 %
Neutro Abs: 6.5 10*3/uL (ref 1.7–7.7)
Neutrophils Relative %: 61 %
Platelets: 371 10*3/uL (ref 150–400)
RBC: 4.44 MIL/uL (ref 3.87–5.11)
RDW: 12.9 % (ref 11.5–15.5)
WBC: 10.7 10*3/uL — ABNORMAL HIGH (ref 4.0–10.5)
nRBC: 0 % (ref 0.0–0.2)

## 2022-05-03 LAB — RESP PANEL BY RT-PCR (FLU A&B, COVID) ARPGX2
Influenza A by PCR: NEGATIVE
Influenza B by PCR: NEGATIVE
SARS Coronavirus 2 by RT PCR: NEGATIVE

## 2022-05-03 LAB — LIPASE, BLOOD: Lipase: 40 U/L (ref 11–51)

## 2022-05-03 MED ORDER — FLUCONAZOLE 150 MG PO TABS
150.0000 mg | ORAL_TABLET | Freq: Once | ORAL | Status: AC
  Administered 2022-05-03: 150 mg via ORAL
  Filled 2022-05-03: qty 1

## 2022-05-03 MED ORDER — CEPHALEXIN 250 MG PO CAPS
500.0000 mg | ORAL_CAPSULE | Freq: Once | ORAL | Status: AC
  Administered 2022-05-03: 500 mg via ORAL
  Filled 2022-05-03: qty 2

## 2022-05-03 MED ORDER — SODIUM CHLORIDE 0.9 % IV BOLUS
1000.0000 mL | Freq: Once | INTRAVENOUS | Status: AC
  Administered 2022-05-03: 1000 mL via INTRAVENOUS

## 2022-05-03 MED ORDER — ONDANSETRON HCL 4 MG/2ML IJ SOLN
4.0000 mg | Freq: Once | INTRAMUSCULAR | Status: AC
  Administered 2022-05-03: 4 mg via INTRAVENOUS
  Filled 2022-05-03: qty 2

## 2022-05-03 MED ORDER — CEPHALEXIN 500 MG PO CAPS: 500.0000 mg | ORAL_CAPSULE | Freq: Two times a day (BID) | ORAL | 0 refills | Status: AC

## 2022-05-03 MED ORDER — ONDANSETRON 4 MG PO TBDP: 4.0000 mg | ORAL_TABLET | Freq: Three times a day (TID) | ORAL | 0 refills | Status: DC | PRN

## 2022-05-03 MED ORDER — HYDROMORPHONE HCL 1 MG/ML IJ SOLN
1.0000 mg | Freq: Once | INTRAMUSCULAR | Status: AC
  Administered 2022-05-03: 1 mg via INTRAVENOUS
  Filled 2022-05-03: qty 1

## 2022-05-03 NOTE — ED Notes (Signed)
Discharge paperwork given and understood. 

## 2022-05-03 NOTE — ED Notes (Signed)
Ambulatory to bathroom. Gait steady. No signs of distress. No vomiting noted.

## 2022-05-03 NOTE — ED Provider Notes (Signed)
[facility] Provider Note   CSN: [id number] Arrival date & time: 05/03/22  1242     History  Chief Complaint  Patient presents with   Cough    Donna [name] is a 51 y.o. female.  HPI 51 year old female presents with a chief complaint of vomiting and abdominal pain.  She has been coughing for about 10 days.  She has chest pain when she coughs but otherwise no chest pain.  Has been having some fevers up until as recently as a couple days ago.  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  However today she has been vomiting since early in the morning has been unable to keep anything down.  She is also having abdominal pain.  No urinary symptoms besides decreased urine output.  No diarrhea. No dyspnea. Some left sided back discomfort.    Home Medications Prior to Admission medications   Medication Sig Start Date End Date Taking? Authorizing Provider  cephALEXin (KEFLEX) 500 MG capsule Take 1 capsule (500 mg total) by mouth 2 (two) times daily for 7 days. 05/03/22 05/10/22 Yes Sherwood Gambler, MD  ondansetron (ZOFRAN-ODT) 4 MG disintegrating tablet Take 1 tablet (4 mg total) by mouth every 8 (eight) hours as needed for nausea or vomiting. 05/03/22  Yes Sherwood Gambler, MD  acetaminophen (TYLENOL) 500 MG tablet Take 500 mg by mouth every 6 (six) hours as needed for moderate pain.    [provider]  blood glucose meter kit and supplies KIT Dispense based on patient and insurance preference. Use up to four times daily as directed. (FOR ICD-9 250.00, 250.01). 03/11/18   Raiford Noble Latif, DO  cholecalciferol (VITAMIN D) 1000 units tablet Take 1,000 Units by mouth daily.    [provider]  Dulaglutide (TRULICITY) 1.5 SK/8.1NG SOPN Inject 1.5 mg into the skin once a week. 04/06/22   Kerin Perna, NP  EPINEPHrine (EPIPEN 2-PAK) 0.3 mg/0.3 mL IJ SOAJ injection Inject 0.3 mLs (0.3 mg total) into the muscle once as  needed (for severe allergic reaction). Patient taking differently: Inject 0.3 mg into the muscle once as needed for anaphylaxis. 06/28/20   Molpus, John, MD  famotidine (PEPCID) 40 MG tablet 1 hour prior to CT 01/13/22   Hollice Espy, MD  lisinopril (ZESTRIL) 10 MG tablet Take 1 tablet (10 mg total) by mouth daily. 01/05/22   Kerin Perna, NP  metFORMIN (GLUCOPHAGE XR) 500 MG 24 hr tablet Take 1 tablet (500 mg total) by mouth 2 (two) times daily before a meal. Patient not taking: Reported on 04/06/2022 01/05/22   Kerin Perna, NP  OVER THE COUNTER MEDICATION Take 2 tablets by mouth daily. [place]    [provider]  vitamin B-12 (CYANOCOBALAMIN) 1000 MCG tablet Take 1,000 mcg by mouth daily.    [provider]  dicyclomine (BENTYL) 20 MG tablet Take 1 tablet (20 mg total) by mouth 2 (two) times daily as needed for spasms. 02/20/19 06/28/20  Fawze, Mina A, PA-C  ipratropium-albuterol (DUONEB) 0.5-2.5 (3) MG/3ML SOLN Take 3 mLs by nebulization every 4 (four) hours as needed. Patient not taking: Reported on 09/22/2018 03/11/18 06/07/19  Raiford Noble Latif, DO  omeprazole (PRILOSEC) 20 MG capsule Take 1 capsule (20 mg total) by mouth 2 (two) times daily before a meal. 06/07/19 06/28/20  Pollina, Gwenyth Allegra, MD  promethazine (PHENERGAN) 25 MG suppository Place 1 suppository (25 mg total) rectally every 6 (six) hours as needed  for nausea or vomiting. 06/07/19 06/28/20  Orpah Greek, MD  sucralfate (CARAFATE) 1 g tablet Take 1 tablet (1 g total) by mouth 4 (four) times daily -  with meals and at bedtime. Patient not taking: Reported on 06/06/2019 02/20/19 06/07/19  Rodell Perna A, PA-C      Allergies    Bee venom, Diamox [acetazolamide], Nsaids, Omnipaque [iohexol], Tolmetin, Compazine [prochlorperazine], Iopamidol, Lactose intolerance (gi), and Reglan [metoclopramide]    Review of Systems   Review of Systems  Constitutional:  Positive for fever.  Respiratory:  Positive  for cough. Negative for shortness of breath.   Cardiovascular:  Positive for chest pain (with coughing only).  Gastrointestinal:  Positive for abdominal pain, nausea and vomiting. Negative for constipation and diarrhea.  Genitourinary:  Positive for decreased urine volume. Negative for dysuria.  Musculoskeletal:  Positive for back pain.   Physical Exam Updated Vital Signs BP 115/68   Pulse 81   Temp 98.2 F (36.8 C) (Oral)   Resp 18   Ht 5' (1.524 m)   Wt 68 kg   LMP 06/14/2003   SpO2 97%   BMI 29.29 kg/m  Physical Exam Vitals and nursing note reviewed.  Constitutional:      Appearance: She is well-developed.  HENT:     Head: Normocephalic and atraumatic.  Cardiovascular:     Rate and Rhythm: Regular rhythm. Tachycardia present.     Heart sounds: Normal heart sounds.  Pulmonary:     Effort: Pulmonary effort is normal.     Breath sounds: Normal breath sounds.  Abdominal:     Palpations: Abdomen is soft.     Tenderness: There is abdominal tenderness in the left upper quadrant and left lower quadrant.     Hernia: A hernia is present. Hernia is present in the ventral area (reducible).    Skin:    General: Skin is warm and dry.  Neurological:     Mental Status: She is alert.    ED Results / Procedures / Treatments   Labs (all labs ordered are listed, but only abnormal results are displayed) Labs Reviewed  URINALYSIS, ROUTINE W REFLEX MICROSCOPIC - Abnormal; Notable for the following components:      Result Value   Ketones, ur 15 (*)    Leukocytes,Ua LARGE (*)    Bacteria, UA MANY (*)    All other components within normal limits  CBC WITH DIFFERENTIAL/PLATELET - Abnormal; Notable for the following components:   WBC 10.7 (*)    Hemoglobin 11.6 (*)    All other components within normal limits  COMPREHENSIVE METABOLIC PANEL - Abnormal; Notable for the following components:   Total Protein 8.7 (*)    All other components within normal limits  RESP PANEL BY RT-PCR  (FLU A&B, COVID) [id number]  URINE CULTURE  LIPASE, BLOOD  CBC WITH DIFFERENTIAL/PLATELET    EKG EKG Interpretation  Date/Time:  Sunday May 03 2022 12:58:17 EDT Ventricular Rate:  114 PR Interval:  132 QRS Duration: 80 QT Interval:  322 QTC Calculation: 443 R Axis:   30 Text Interpretation: Sinus tachycardia Nonspecific ST and T wave abnormality Abnormal ECG When compared with ECG of 21-Aug-2020 17:17, PREVIOUS ECG IS PRESENT when compared to prior, similar appearance. No STEMI Confirmed by Antony Blackbird [phone]) on 05/03/2022 2:30:57 PM  Radiology CT ABDOMEN PELVIS WO CONTRAST  Result Date: 05/03/2022 CLINICAL DATA:  Left lower quadrant abdominal pain. EXAM: CT ABDOMEN AND PELVIS WITHOUT CONTRAST TECHNIQUE: Multidetector CT imaging of the abdomen and pelvis  was performed following the standard protocol without IV contrast. RADIATION DOSE REDUCTION: This exam was performed according to the departmental dose-optimization program which includes automated exposure control, adjustment of the mA and/or kV according to patient size and/or use of iterative reconstruction technique. COMPARISON:  CT abdomen and pelvis 11/22/2021. FINDINGS: Lower chest: No acute abnormality. Hepatobiliary: No focal liver abnormality is seen. No gallstones, gallbladder wall thickening, or biliary dilatation. Pancreas: Unremarkable. No pancreatic ductal dilatation or surrounding inflammatory changes. Spleen: Normal in size without focal abnormality. Adrenals/Urinary Tract: There is a 5.5 cm left renal cyst. Otherwise, kidneys, adrenal glands and bladder are within normal limits. Stomach/Bowel: Stomach is within normal limits. Appendix appears normal. No evidence of bowel wall thickening, distention, or inflammatory changes. Vascular/Lymphatic: No significant vascular findings are present. No enlarged abdominal or pelvic lymph nodes. Reproductive: Status post hysterectomy. No adnexal masses. Other: No abdominal wall hernia or  abnormality. No abdominopelvic ascites. Musculoskeletal: No acute or significant osseous findings. IMPRESSION: No acute process in the abdomen or pelvis. Stable 5.5 cm left renal cysts. Electronically Signed   By: Ronney Asters M.D.   On: 05/03/2022 16:03   DG Chest 2 View  Result Date: 05/03/2022 CLINICAL DATA:  Cough, shortness of breath for 1 week. EXAM: CHEST - 2 VIEW COMPARISON:  Chest radiograph dated 02/19/2019. FINDINGS: The heart size and mediastinal contours are within normal limits. Both lungs are clear. The visualized skeletal structures are unremarkable. IMPRESSION: No active cardiopulmonary disease. Electronically Signed   By: Zerita Boers M.D.   On: 05/03/2022 13:30    Procedures Procedures    Medications Ordered in ED Medications  cephALEXin (KEFLEX) capsule 500 mg (has no administration in time range)  fluconazole (DIFLUCAN) tablet 150 mg (has no administration in time range)  sodium chloride 0.9 % bolus 1,000 mL (0 mLs Intravenous Stopped 05/03/22 1854)  HYDROmorphone (DILAUDID) injection 1 mg (1 mg Intravenous Given 05/03/22 1720)  ondansetron (ZOFRAN) injection 4 mg (4 mg Intravenous Given 05/03/22 1720)    ED Course/ Medical Decision Making/ A&P                           Medical Decision Making Amount and/or Complexity of Data Reviewed External Data Reviewed: notes. Labs: ordered. Radiology: ordered and independent interpretation performed.  Risk Prescription drug management.   Patient is feeling a lot better after IV Dilaudid, Zofran, and fluids.  Chest x-ray images viewed by myself and there is no pneumonia.  She was tachycardic and hypertensive on arrival but after symptom control she is much better and now her vital signs are normal upon discharge.  Her respiratory symptoms have been ongoing for over a week but with no obvious pneumonia I do not think antibiotics are warranted for the respiratory tract.  COVID/flu testing was negative.  Labs reviewed/interpreted  and she has a slight leukocytosis that could just be from vomiting but otherwise her CMP and lipase are pretty benign.  Urinalysis however does appear concerning for urinary tract infection and she does report some mild change in her urinary output.  I think is reasonable to send for culture and treat as this could be causing vomiting which is a new symptom.  She is also noted to have some yeast so I will give her a dose of Diflucan.  I personally viewed/interpreted the CT images which showed no obvious signs of bowel obstruction.  At this point, she is feeling a lot better and will be discharged  with antiemetics and Keflex.  Given return precautions.        Final Clinical Impression(s) / ED Diagnoses Final diagnoses:  Vomiting in adult  Acute urinary tract infection    Rx / DC Orders ED Discharge Orders          Ordered    cephALEXin (KEFLEX) 500 MG capsule  2 times daily        05/03/22 1957    ondansetron (ZOFRAN-ODT) 4 MG disintegrating tablet  Every 8 hours PRN        05/03/22 Lona Kettle              Sherwood Gambler, MD 05/03/22 2009

## 2022-05-03 NOTE — ED Triage Notes (Signed)
Pt c/o cough with shortness of breath x 1 week, Pt reports nausea with emesis onset today.

## 2022-05-03 NOTE — Discharge Instructions (Signed)
If you develop worsening, continued, or recurrent abdominal pain, uncontrolled vomiting, fever, chest or back pain, or any other new/concerning symptoms then return to the ER for evaluation.  

## 2022-05-05 LAB — URINE CULTURE: Culture: >=100000 — AB

## 2022-05-06 ENCOUNTER — Telehealth: Payer: Self-pay

## 2022-05-06 NOTE — Progress Notes (Addendum)
ED Antimicrobial Stewardship Positive Culture Follow Up   Donna [name] is an 51 y.o. female who presented to [facility] [place] on 05/03/2022 with a chief complaint of  Chief Complaint  Patient presents with   Cough    Recent Results (from the past 720 hour(s))  Resp Panel by RT-PCR (Flu A&B, Covid) Nasopharyngeal Swab     Status: None   Collection Time: 05/03/22  1:01 PM   Specimen: Nasopharyngeal Swab; Nasopharyngeal(NP) swabs in vial transport medium  Result Value Ref Range Status   SARS Coronavirus 2 by RT PCR NEGATIVE NEGATIVE Final    Comment: (NOTE) SARS-CoV-2 target nucleic acids are NOT DETECTED.  The SARS-CoV-2 RNA is generally detectable in upper respiratory specimens during the acute phase of infection. The lowest concentration of SARS-CoV-2 viral copies this assay can detect is 138 copies/mL. A negative result does not preclude SARS-Cov-2 infection and should not be used as the sole basis for treatment or other patient management decisions. A negative result may occur with  improper specimen collection/handling, submission of specimen other than nasopharyngeal swab, presence of viral mutation(s) within the areas targeted by this assay, and inadequate number of viral copies(<138 copies/mL). A negative result must be combined with clinical observations, patient history, and epidemiological information. The expected result is Negative.  Fact Sheet for Patients:  BloggerCourse.com  Fact Sheet for Healthcare Providers:  SeriousBroker.it  This test is no t yet approved or cleared by the [place] FDA and  has been authorized for detection and/or diagnosis of SARS-CoV-2 by FDA under an Emergency Use Authorization (EUA). This EUA will remain  in effect (meaning this test can be used) for the duration of the COVID-19 declaration under Section 564(b)(1) of the Act, 21 U.S.C.section 360bbb-3(b)(1), unless the authorization is  terminated  or revoked sooner.       Influenza A by PCR NEGATIVE NEGATIVE Final   Influenza B by PCR NEGATIVE NEGATIVE Final    Comment: (NOTE) The Xpert Xpress SARS-CoV-2/FLU/RSV plus assay is intended as an aid in the diagnosis of influenza from Nasopharyngeal swab specimens and should not be used as a sole basis for treatment. Nasal washings and aspirates are unacceptable for Xpert Xpress SARS-CoV-2/FLU/RSV testing.  Fact Sheet for Patients: BloggerCourse.com  Fact Sheet for Healthcare Providers: SeriousBroker.it  This test is not yet approved or cleared by the [place] FDA and has been authorized for detection and/or diagnosis of SARS-CoV-2 by FDA under an Emergency Use Authorization (EUA). This EUA will remain in effect (meaning this test can be used) for the duration of the COVID-19 declaration under Section 564(b)(1) of the Act, 21 U.S.C. section 360bbb-3(b)(1), unless the authorization is terminated or revoked.  Performed at [facility], [address] [place], [place] [address]   Urine Culture     Status: Abnormal   Collection Time: 05/03/22  6:54 PM   Specimen: Urine, Clean Catch  Result Value Ref Range Status   Specimen Description   Final    URINE, CLEAN CATCH Performed at [facility], [address] [place], [place] [address]    Special Requests   Final    NONE Performed at [facility], [address] [place], [place] [address]    Culture (A)  Final    >=100,000 COLONIES/mL LACTOBACILLUS SPECIES Standardized susceptibility testing for this organism is not available. Performed at [facility], [address] [place], [place] [address]    Report Status 05/05/2022 FINAL  Final  Urine culture shows normal flora. No additional antibiotics indicated.   ED Provider: Cheron Schaumann, PA-C   Rushie Goltz 05/06/2022, 9:16  AM Clinical Pharmacist Monday - Friday phone -  [phone] Saturday - Sunday phone - [phone]

## 2022-05-06 NOTE — Telephone Encounter (Signed)
Post ED Visit - Positive Culture Follow-up  Culture report reviewed by antimicrobial stewardship pharmacist: [facility] [x]  , Pharm.D. []  Valeda Malm, Pharm.D., BCPS AQ-ID []  , Pharm.D., BCPS []  Celedonio Miyamoto, .D., BCPS []  [place], .D., BCPS, AAHIVP []  Georgina Pillion, Pharm.D., BCPS, AAHIVP []  [address], PharmD, BCPS []  , PharmD, BCPS []  [place], PharmD, BCPS []  [address], PharmD []  , PharmD, BCPS []  Estella Husk, PharmD  Pharmacy Team []  Lysle Pearl, PharmD []  , PharmD []  Phillips Climes, PharmD []  , Rph []  Agapito Games) , PharmD []  Verlan Friends, PharmD []  , PharmD []  Mervyn Gay, PharmD []  , PharmD []  Vinnie Level, PharmD []  [facility], PharmD []  , PharmD []  Len Childs, PharmD   Positive urine culture Treated with Cephalexin, organism sensitive to the same and no further patient follow-up is required at this time.  05/06/2022, 12:43 PM

## 2022-05-07 ENCOUNTER — Ambulatory Visit: Payer: 59 | Admitting: Pharmacist

## 2022-06-05 ENCOUNTER — Emergency Department (HOSPITAL_COMMUNITY): Payer: 59

## 2022-06-05 ENCOUNTER — Encounter (HOSPITAL_COMMUNITY): Payer: Self-pay | Admitting: Emergency Medicine

## 2022-06-05 ENCOUNTER — Emergency Department (HOSPITAL_COMMUNITY)
Admission: EM | Admit: 2022-06-05 | Discharge: 2022-06-05 | Disposition: A | Discharge status: Home / Self Care | Payer: 59 | Attending: Emergency Medicine | Admitting: Emergency Medicine

## 2022-06-05 DIAGNOSIS — R630 Anorexia: Secondary | ICD-10-CM | POA: Insufficient documentation

## 2022-06-05 DIAGNOSIS — R1084 Generalized abdominal pain: Secondary | ICD-10-CM | POA: Insufficient documentation

## 2022-06-05 DIAGNOSIS — R112 Nausea with vomiting, unspecified: Secondary | ICD-10-CM | POA: Diagnosis not present

## 2022-06-05 DIAGNOSIS — R739 Hyperglycemia, unspecified: Secondary | ICD-10-CM | POA: Diagnosis not present

## 2022-06-05 LAB — CBC
HCT: 41.2 % (ref 36.0–46.0)
Hemoglobin: 13.1 g/dL (ref 12.0–15.0)
MCH: 27.3 pg (ref 26.0–34.0)
MCHC: 31.8 g/dL (ref 30.0–36.0)
MCV: 85.8 fL (ref 80.0–100.0)
Platelets: 355 10*3/uL (ref 150–400)
RBC: 4.8 MIL/uL (ref 3.87–5.11)
RDW: 13.4 % (ref 11.5–15.5)
WBC: 7 10*3/uL (ref 4.0–10.5)
nRBC: 0 % (ref 0.0–0.2)

## 2022-06-05 LAB — COMPREHENSIVE METABOLIC PANEL
ALT: 17 U/L (ref 0–44)
AST: 19 U/L (ref 15–41)
Albumin: 4.5 g/dL (ref 3.5–5.0)
Alkaline Phosphatase: 101 U/L (ref 38–126)
Anion gap: 9 (ref 5–15)
BUN: 19 mg/dL (ref 6–20)
CO2: 24 mmol/L (ref 22–32)
Calcium: 10.2 mg/dL (ref 8.9–10.3)
Chloride: 104 mmol/L (ref 98–111)
Creatinine, Ser: 0.91 mg/dL (ref 0.44–1.00)
GFR, Estimated: >60 mL/min (ref >60)
Glucose, Bld: 103 mg/dL — ABNORMAL HIGH (ref 70–99)
Potassium: 4.1 mmol/L (ref 3.5–5.1)
Sodium: 137 mmol/L (ref 135–145)
Total Bilirubin: 0.8 mg/dL (ref 0.3–1.2)
Total Protein: 8.3 g/dL — ABNORMAL HIGH (ref 6.5–8.1)

## 2022-06-05 LAB — LIPASE, BLOOD: Lipase: 34 U/L (ref 11–51)

## 2022-06-05 LAB — I-STAT BETA HCG BLOOD, ED (MC, WL, AP ONLY): I-stat hCG, quantitative: <5 m[IU]/mL (ref <5)

## 2022-06-05 MED ORDER — DROPERIDOL 2.5 MG/ML IJ SOLN
1.2500 mg | Freq: Once | INTRAMUSCULAR | Status: AC
  Administered 2022-06-05: 1.25 mg via INTRAVENOUS
  Filled 2022-06-05: qty 2

## 2022-06-05 MED ORDER — FAMOTIDINE 20 MG PO TABS: 20.0000 mg | ORAL_TABLET | Freq: Two times a day (BID) | ORAL | 0 refills | Status: DC

## 2022-06-05 MED ORDER — FAMOTIDINE IN NACL 20-0.9 MG/50ML-% IV SOLN
20.0000 mg | Freq: Once | INTRAVENOUS | Status: AC
  Administered 2022-06-05: 20 mg via INTRAVENOUS
  Filled 2022-06-05: qty 50

## 2022-06-05 MED ORDER — SUCRALFATE 1 G PO TABS: 1.0000 g | ORAL_TABLET | Freq: Three times a day (TID) | ORAL | 0 refills | Status: DC

## 2022-06-05 MED ORDER — SODIUM CHLORIDE 0.9 % IV BOLUS
500.0000 mL | Freq: Once | INTRAVENOUS | Status: AC
  Administered 2022-06-05: 500 mL via INTRAVENOUS

## 2022-06-05 NOTE — ED Triage Notes (Signed)
Patient here from home reporting 1 month of abd pain increasingly worse for the past 2 days to the left upper and lower side. Difficulty eating without n/v.

## 2022-06-08 ENCOUNTER — Encounter (HOSPITAL_COMMUNITY): Payer: Self-pay | Admitting: Emergency Medicine

## 2022-06-08 ENCOUNTER — Emergency Department (HOSPITAL_COMMUNITY)
Admission: EM | Admit: 2022-06-08 | Discharge: 2022-06-08 | Disposition: A | Discharge status: Home / Self Care | Payer: 59 | Attending: Emergency Medicine | Admitting: Emergency Medicine

## 2022-06-08 DIAGNOSIS — R300 Dysuria: Secondary | ICD-10-CM | POA: Insufficient documentation

## 2022-06-08 DIAGNOSIS — Z79899 Other long term (current) drug therapy: Secondary | ICD-10-CM | POA: Diagnosis not present

## 2022-06-08 DIAGNOSIS — R000 Tachycardia, unspecified: Secondary | ICD-10-CM | POA: Diagnosis not present

## 2022-06-08 DIAGNOSIS — R1013 Epigastric pain: Secondary | ICD-10-CM | POA: Insufficient documentation

## 2022-06-08 DIAGNOSIS — R112 Nausea with vomiting, unspecified: Secondary | ICD-10-CM | POA: Insufficient documentation

## 2022-06-08 LAB — CBC WITH DIFFERENTIAL/PLATELET
Abs Immature Granulocytes: 0.01 10*3/uL (ref 0.00–0.07)
Basophils Absolute: 0.1 10*3/uL (ref 0.0–0.1)
Basophils Relative: 1 %
Eosinophils Absolute: 0.1 10*3/uL (ref 0.0–0.5)
Eosinophils Relative: 2 %
HCT: 48.3 % — ABNORMAL HIGH (ref 36.0–46.0)
Hemoglobin: 15.4 g/dL — ABNORMAL HIGH (ref 12.0–15.0)
Immature Granulocytes: 0 %
Lymphocytes Relative: 39 %
Lymphs Abs: 1.8 10*3/uL (ref 0.7–4.0)
MCH: 27.4 pg (ref 26.0–34.0)
MCHC: 31.9 g/dL (ref 30.0–36.0)
MCV: 85.8 fL (ref 80.0–100.0)
Monocytes Absolute: 0.3 10*3/uL (ref 0.1–1.0)
Monocytes Relative: 6 %
Neutro Abs: 2.5 10*3/uL (ref 1.7–7.7)
Neutrophils Relative %: 52 %
Platelets: 247 10*3/uL (ref 150–400)
RBC: 5.63 MIL/uL — ABNORMAL HIGH (ref 3.87–5.11)
RDW: 13.4 % (ref 11.5–15.5)
WBC: 4.7 10*3/uL (ref 4.0–10.5)
nRBC: 0 % (ref 0.0–0.2)

## 2022-06-08 LAB — URINALYSIS, ROUTINE W REFLEX MICROSCOPIC
Bilirubin Urine: NEGATIVE
Glucose, UA: NEGATIVE mg/dL
Hgb urine dipstick: NEGATIVE
Ketones, ur: NEGATIVE mg/dL
Leukocytes,Ua: NEGATIVE
Nitrite: NEGATIVE
Protein, ur: NEGATIVE mg/dL
Specific Gravity, Urine: 1.019 (ref 1.005–1.030)
pH: 5 (ref 5.0–8.0)

## 2022-06-08 LAB — COMPREHENSIVE METABOLIC PANEL
ALT: 17 U/L (ref 0–44)
AST: 33 U/L (ref 15–41)
Albumin: 4.9 g/dL (ref 3.5–5.0)
Alkaline Phosphatase: 97 U/L (ref 38–126)
Anion gap: 9 (ref 5–15)
BUN: 26 mg/dL — ABNORMAL HIGH (ref 6–20)
CO2: 22 mmol/L (ref 22–32)
Calcium: 9.9 mg/dL (ref 8.9–10.3)
Chloride: 106 mmol/L (ref 98–111)
Creatinine, Ser: 1.01 mg/dL — ABNORMAL HIGH (ref 0.44–1.00)
GFR, Estimated: >60 mL/min (ref >60)
Glucose, Bld: 90 mg/dL (ref 70–99)
Potassium: 4.4 mmol/L (ref 3.5–5.1)
Sodium: 137 mmol/L (ref 135–145)
Total Bilirubin: 1 mg/dL (ref 0.3–1.2)
Total Protein: 9.1 g/dL — ABNORMAL HIGH (ref 6.5–8.1)

## 2022-06-08 LAB — RAPID URINE DRUG SCREEN, HOSP PERFORMED
Amphetamines: NOT DETECTED
Barbiturates: NOT DETECTED
Benzodiazepines: NOT DETECTED
Cocaine: NOT DETECTED
Opiates: NOT DETECTED
Tetrahydrocannabinol: NOT DETECTED

## 2022-06-08 LAB — LIPASE, BLOOD: Lipase: 34 U/L (ref 11–51)

## 2022-06-08 MED ORDER — PROMETHAZINE HCL 25 MG PO TABS: 25.0000 mg | ORAL_TABLET | Freq: Four times a day (QID) | ORAL | 0 refills | Status: DC | PRN

## 2022-06-08 MED ORDER — SODIUM CHLORIDE 0.9 % IV BOLUS
1000.0000 mL | Freq: Once | INTRAVENOUS | Status: AC
  Administered 2022-06-08: 1000 mL via INTRAVENOUS

## 2022-06-08 MED ORDER — DROPERIDOL 2.5 MG/ML IJ SOLN
1.2500 mg | Freq: Once | INTRAMUSCULAR | Status: AC
  Administered 2022-06-08: 1.25 mg via INTRAVENOUS
  Filled 2022-06-08: qty 2

## 2022-06-08 MED ORDER — FAMOTIDINE IN NACL 20-0.9 MG/50ML-% IV SOLN
20.0000 mg | Freq: Once | INTRAVENOUS | Status: AC
  Administered 2022-06-08: 20 mg via INTRAVENOUS
  Filled 2022-06-08: qty 50

## 2022-06-08 MED ORDER — FENTANYL CITRATE PF 50 MCG/ML IJ SOSY
50.0000 ug | PREFILLED_SYRINGE | Freq: Once | INTRAMUSCULAR | Status: AC
  Administered 2022-06-08: 50 ug via INTRAVENOUS
  Filled 2022-06-08: qty 1

## 2022-06-08 NOTE — ED Provider Notes (Signed)
[facility]-EMERGENCY DEPT Provider Note   CSN: [id number] Arrival date & time: 06/08/22  1216     History  Chief Complaint  Patient presents with   Abdominal Pain    Donna [name] is a 51 y.o. female.  She is here with a complaint of upper abdominal pain nausea and vomiting that is been going on for a month.  Worse for the last week.  No blood in the vomit.  No diarrhea or constipation.  She was seen here a few days ago and had labs and imaging that did not show any obvious explanation.  Was discharged with Pepcid and Carafate that are not helping.  Denies any fevers.  She has prior surgical history of C-section, hysterectomy, and stomach tumor removal.  She has not seen a GI doctor.  She does endorse some urinary burning.  She was treated for UTI at the end of May with keflex  The history is provided by the patient.  Abdominal Pain Pain location:  Epigastric Pain severity:  Severe Onset quality:  Gradual Duration:  1 week Timing:  Intermittent Progression:  Unchanged Chronicity:  New Context: not sick contacts and not trauma   Relieved by:  Nothing Worsened by:  Vomiting Ineffective treatments:  None tried Associated symptoms: dysuria, nausea and vomiting   Associated symptoms: no chest pain, no chills, no constipation, no cough, no diarrhea, no fever, no hematemesis, no shortness of breath and no sore throat        Home Medications Prior to Admission medications   Medication Sig Start Date End Date Taking? Authorizing Provider  acetaminophen (TYLENOL) 500 MG tablet Take 500 mg by mouth every 6 (six) hours as needed for moderate pain.    [provider]  blood glucose meter kit and supplies KIT Dispense based on patient and insurance preference. Use up to four times daily as directed. (FOR ICD-9 250.00, 250.01). 03/11/18   Marguerita Merles Latif, DO  cholecalciferol (VITAMIN D) 1000 units tablet Take 1,000 Units by mouth daily.    [provider]  Dulaglutide (TRULICITY) 1.5 MG/0.5ML SOPN Inject 1.5 mg into the skin once a week. 04/06/22   Grayce Sessions, NP  EPINEPHrine (EPIPEN 2-PAK) 0.3 mg/0.3 mL IJ SOAJ injection Inject 0.3 mLs (0.3 mg total) into the muscle once as needed (for severe allergic reaction). Patient taking differently: Inject 0.3 mg into the muscle once as needed for anaphylaxis. 06/28/20   Molpus, John, MD  famotidine (PEPCID) 20 MG tablet Take 1 tablet (20 mg total) by mouth 2 (two) times daily. 06/05/22   Gerhard Munch, MD  famotidine (PEPCID) 40 MG tablet 1 hour prior to CT 01/13/22   Vanna Scotland, MD  lisinopril (ZESTRIL) 10 MG tablet Take 1 tablet (10 mg total) by mouth daily. 01/05/22   Grayce Sessions, NP  metFORMIN (GLUCOPHAGE XR) 500 MG 24 hr tablet Take 1 tablet (500 mg total) by mouth 2 (two) times daily before a meal. Patient not taking: Reported on 04/06/2022 01/05/22   Grayce Sessions, NP  ondansetron (ZOFRAN-ODT) 4 MG disintegrating tablet Take 1 tablet (4 mg total) by mouth every 8 (eight) hours as needed for nausea or vomiting. 05/03/22   Pricilla Loveless, MD  OVER THE COUNTER MEDICATION Take 2 tablets by mouth daily. Amberen    [provider]  sucralfate (CARAFATE) 1 g tablet Take 1 tablet (1 g total) by mouth 4 (four) times daily -  with meals and at bedtime. 06/05/22  Gerhard Munch, MD  vitamin B-12 (CYANOCOBALAMIN) 1000 MCG tablet Take 1,000 mcg by mouth daily.    [provider]  dicyclomine (BENTYL) 20 MG tablet Take 1 tablet (20 mg total) by mouth 2 (two) times daily as needed for spasms. 02/20/19 06/28/20  Fawze, Mina A, PA-C  ipratropium-albuterol (DUONEB) 0.5-2.5 (3) MG/3ML SOLN Take 3 mLs by nebulization every 4 (four) hours as needed. Patient not taking: Reported on 09/22/2018 03/11/18 06/07/19  Marguerita Merles Latif, DO  omeprazole (PRILOSEC) 20 MG capsule Take 1 capsule (20 mg total) by mouth 2 (two) times daily before a meal. 06/07/19 06/28/20  Pollina,  Canary Brim, MD  promethazine (PHENERGAN) 25 MG suppository Place 1 suppository (25 mg total) rectally every 6 (six) hours as needed for nausea or vomiting. 06/07/19 06/28/20  Gilda Crease, MD      Allergies    Bee venom, Diamox [acetazolamide], Nsaids, Omnipaque [iohexol], Tolmetin, Compazine [prochlorperazine], Iopamidol, Lactose intolerance (gi), and Reglan [metoclopramide]    Review of Systems   Review of Systems  Constitutional:  Negative for chills and fever.  HENT:  Negative for sore throat.   Eyes:  Negative for visual disturbance.  Respiratory:  Negative for cough and shortness of breath.   Cardiovascular:  Negative for chest pain.  Gastrointestinal:  Positive for abdominal pain, nausea and vomiting. Negative for constipation, diarrhea and hematemesis.  Genitourinary:  Positive for dysuria.  Skin:  Negative for rash.  Neurological:  Negative for headaches.    Physical Exam Updated Vital Signs BP (!) 130/98   Pulse (!) 112   Temp 98.1 F (36.7 C) (Oral)   Resp 20   LMP 06/14/2003   SpO2 97%  Physical Exam Vitals and nursing note reviewed.  Constitutional:      General: She is in acute distress.     Appearance: Normal appearance. She is well-developed.  HENT:     Head: Normocephalic and atraumatic.  Eyes:     Conjunctiva/sclera: Conjunctivae normal.  Cardiovascular:     Rate and Rhythm: Regular rhythm. Tachycardia present.     Heart sounds: No murmur heard. Pulmonary:     Effort: Pulmonary effort is normal. No respiratory distress.     Breath sounds: Normal breath sounds.  Abdominal:     Palpations: Abdomen is soft.     Tenderness: There is generalized abdominal tenderness. There is no guarding or rebound.  Musculoskeletal:     Cervical back: Neck supple.     Right lower leg: No edema.     Left lower leg: No edema.  Skin:    General: Skin is warm and dry.     Capillary Refill: Capillary refill takes less than 2 seconds.  Neurological:      General: No focal deficit present.     Mental Status: She is alert.     ED Results / Procedures / Treatments   Labs (all labs ordered are listed, but only abnormal results are displayed) Labs Reviewed  COMPREHENSIVE METABOLIC PANEL - Abnormal; Notable for the following components:      Result Value   BUN 26 (*)    Creatinine, Ser 1.01 (*)    Total Protein 9.1 (*)    All other components within normal limits  CBC WITH DIFFERENTIAL/PLATELET - Abnormal; Notable for the following components:   RBC 5.63 (*)    Hemoglobin 15.4 (*)    HCT 48.3 (*)    All other components within normal limits  LIPASE, BLOOD  URINALYSIS, ROUTINE W REFLEX MICROSCOPIC  RAPID URINE DRUG SCREEN, HOSP PERFORMED    EKG None  Radiology No results found.  Procedures Procedures    Medications Ordered in ED Medications  sodium chloride 0.9 % bolus 1,000 mL (has no administration in time range)  droperidol (INAPSINE) 2.5 MG/ML injection 1.25 mg (has no administration in time range)  fentaNYL (SUBLIMAZE) injection 50 mcg (has no administration in time range)  famotidine (PEPCID) IVPB 20 mg premix (has no administration in time range)    ED Course/ Medical Decision Making/ A&P Clinical Course as of 06/08/22 Jeanne Ivan Jun 08, 2022  1612 Patient's symptoms improved after fluids and medication.  Have put a referral in for her to see GI.  We will add Phenergan to her medicine regimen.  She said she is taken that before without any allergy.  No indications for imaging at this time is just had a CT 3 days ago. [MB]  1614 Patient states she feels improved and does not want to try p.o. trial here.  She just wants to go home and go to bed.  Return instructions discussed [MB]    Clinical Course User Index [MB] Terrilee Files, MD                           Medical Decision Making Amount and/or Complexity of Data Reviewed Labs: ordered.  Risk Prescription drug management.  This patient complains of  abdominal pain nausea vomiting; this involves an extensive number of treatment Options and is a complaint that carries with it a high risk of complications and morbidity. The differential includes gastritis, cannabis hyperemesis, gastroparesis, biliary colic, obstruction  I ordered, reviewed and interpreted labs, which included CBC with normal white count normal hemoglobin, chemistries normal LFTs normal, urinalysis without signs of infection, U tox negative I ordered medication IV fluids and pain medication, nausea medication and acid medication with improvement in her symptoms and reviewed PMP when indicated. Previous records obtained and reviewed in epic including recent ED visits and prior imaging Cardiac monitoring reviewed, normal sinus rhythm Social determinants considered, no significant barriers Critical Interventions: None  After the interventions stated above, I reevaluated the patient and found patient to be symptomatically improved Admission and further testing considered, no indications for admission or further testing at this time.  She feels she needs repeat imaging at this time.  We will add additional nausea medication and a referral to GI.  Return instructions discussed          Final Clinical Impression(s) / ED Diagnoses Final diagnoses:  Epigastric pain  Nausea and vomiting, unspecified vomiting type    Rx / DC Orders ED Discharge Orders          Ordered    promethazine (PHENERGAN) 25 MG tablet  Every 6 hours PRN        06/08/22 1542    Ambulatory referral to Gastroenterology        06/08/22 1542              Terrilee Files, MD 06/08/22 1824

## 2022-06-29 ENCOUNTER — Encounter: Payer: Self-pay | Admitting: Emergency Medicine

## 2022-07-01 ENCOUNTER — Other Ambulatory Visit: Payer: Self-pay

## 2022-07-01 ENCOUNTER — Emergency Department (HOSPITAL_BASED_OUTPATIENT_CLINIC_OR_DEPARTMENT_OTHER): Payer: 59

## 2022-07-01 ENCOUNTER — Encounter (HOSPITAL_BASED_OUTPATIENT_CLINIC_OR_DEPARTMENT_OTHER): Payer: Self-pay

## 2022-07-01 ENCOUNTER — Emergency Department (HOSPITAL_BASED_OUTPATIENT_CLINIC_OR_DEPARTMENT_OTHER)
Admission: EM | Admit: 2022-07-01 | Discharge: 2022-07-02 | Disposition: A | Discharge status: Home / Self Care | Payer: 59 | Attending: Emergency Medicine | Admitting: Emergency Medicine

## 2022-07-01 DIAGNOSIS — R109 Unspecified abdominal pain: Secondary | ICD-10-CM | POA: Diagnosis present

## 2022-07-01 DIAGNOSIS — R112 Nausea with vomiting, unspecified: Secondary | ICD-10-CM | POA: Insufficient documentation

## 2022-07-01 LAB — URINALYSIS, ROUTINE W REFLEX MICROSCOPIC
Bilirubin Urine: NEGATIVE
Glucose, UA: NEGATIVE mg/dL
Hgb urine dipstick: NEGATIVE
Ketones, ur: NEGATIVE mg/dL
Nitrite: NEGATIVE
Specific Gravity, Urine: 1.024 (ref 1.005–1.030)
pH: 5.5 (ref 5.0–8.0)

## 2022-07-01 LAB — COMPREHENSIVE METABOLIC PANEL
ALT: 21 U/L (ref 0–44)
AST: 40 U/L (ref 15–41)
Albumin: 4.7 g/dL (ref 3.5–5.0)
Alkaline Phosphatase: 84 U/L (ref 38–126)
Anion gap: 12 (ref 5–15)
BUN: 18 mg/dL (ref 6–20)
CO2: 20 mmol/L — ABNORMAL LOW (ref 22–32)
Calcium: 9.7 mg/dL (ref 8.9–10.3)
Chloride: 105 mmol/L (ref 98–111)
Creatinine, Ser: 0.69 mg/dL (ref 0.44–1.00)
GFR, Estimated: >60 mL/min (ref >60)
Glucose, Bld: 107 mg/dL — ABNORMAL HIGH (ref 70–99)
Potassium: 5.5 mmol/L — ABNORMAL HIGH (ref 3.5–5.1)
Sodium: 137 mmol/L (ref 135–145)
Total Bilirubin: 0.2 mg/dL — ABNORMAL LOW (ref 0.3–1.2)
Total Protein: 8 g/dL (ref 6.5–8.1)

## 2022-07-01 LAB — LIPASE, BLOOD: Lipase: 46 U/L (ref 11–51)

## 2022-07-01 LAB — CBC
HCT: 36.4 % (ref 36.0–46.0)
Hemoglobin: 11.8 g/dL — ABNORMAL LOW (ref 12.0–15.0)
MCH: 27.5 pg (ref 26.0–34.0)
MCHC: 32.4 g/dL (ref 30.0–36.0)
MCV: 84.8 fL (ref 80.0–100.0)
Platelets: 359 10*3/uL (ref 150–400)
RBC: 4.29 MIL/uL (ref 3.87–5.11)
RDW: 13.5 % (ref 11.5–15.5)
WBC: 7.9 10*3/uL (ref 4.0–10.5)
nRBC: 0 % (ref 0.0–0.2)

## 2022-07-01 MED ORDER — ONDANSETRON HCL 4 MG/2ML IJ SOLN
4.0000 mg | Freq: Once | INTRAMUSCULAR | Status: AC
  Administered 2022-07-01: 4 mg via INTRAVENOUS
  Filled 2022-07-01: qty 2

## 2022-07-01 MED ORDER — DIPHENHYDRAMINE HCL 25 MG PO CAPS
50.0000 mg | ORAL_CAPSULE | Freq: Once | ORAL | Status: AC
Start: 2022-07-02 — End: 2022-07-02

## 2022-07-01 MED ORDER — HYDROMORPHONE HCL 1 MG/ML IJ SOLN
1.0000 mg | Freq: Once | INTRAMUSCULAR | Status: AC
  Administered 2022-07-01: 1 mg via INTRAVENOUS
  Filled 2022-07-01: qty 1

## 2022-07-01 MED ORDER — METHYLPREDNISOLONE SODIUM SUCC 40 MG IJ SOLR
40.0000 mg | Freq: Once | INTRAMUSCULAR | Status: AC
  Administered 2022-07-01: 40 mg via INTRAVENOUS
  Filled 2022-07-01: qty 1

## 2022-07-01 MED ORDER — FAMOTIDINE IN NACL 20-0.9 MG/50ML-% IV SOLN
20.0000 mg | Freq: Once | INTRAVENOUS | Status: AC
  Administered 2022-07-01: 20 mg via INTRAVENOUS
  Filled 2022-07-01: qty 50

## 2022-07-01 MED ORDER — SODIUM CHLORIDE 0.9 % IV BOLUS
1000.0000 mL | Freq: Once | INTRAVENOUS | Status: AC
  Administered 2022-07-01: 1000 mL via INTRAVENOUS

## 2022-07-01 MED ORDER — DIPHENHYDRAMINE HCL 50 MG/ML IJ SOLN
50.0000 mg | Freq: Once | INTRAMUSCULAR | Status: AC
  Administered 2022-07-02: 50 mg via INTRAVENOUS
  Filled 2022-07-01: qty 1

## 2022-07-01 NOTE — ED Provider Notes (Signed)
[facility] EMERGENCY [facility] Emergency Department Provider Note MRN:  [id number]  Arrival date & time: 07/02/22     Chief Complaint   Abdominal Pain   History of Present Illness   Donna [name] is a 51 y.o. year-old female with a history of lupus, endometriosis, DVT presenting to the ED with chief complaint of abdominal pain.  Worsening abdominal pain over the past day or 2.  Unexplained abdominal pain for the past month or so, has an appoint with GI but not for another month.  Severe pain this evening with nausea vomiting, cannot keep anything down.  Review of Systems  A thorough review of systems was obtained and all systems are negative except as noted in the HPI and PMH.   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   Endometriosis    Lupus (systemic lupus erythematosus) (HCC) 2007   Migraine    Seizures (HCC)    last sz 06/2013, no meds    Past Surgical History:  Procedure Laterality Date   ABDOMINAL HYSTERECTOMY     x2 partial   CESAREAN SECTION     x3   MASS EXCISION  02/2014   abd    Family History  Problem Relation Age of Onset   CVA Mother 41   Alzheimer's disease Father    Breast cancer Sister    Ovarian cancer Sister    Stomach cancer Sister    Breast cancer Maternal Aunt    Ovarian cancer Maternal Aunt    Stomach cancer Maternal Aunt    Birth defects Maternal Uncle    Colon cancer Neg Hx    Esophageal cancer Neg Hx    Pancreatic cancer Neg Hx     Social History   Socioeconomic History   Marital status: Divorced    Spouse name: Not on file   Number of children: 3   Years of education: Not on file   Highest education level: Not on file  Occupational History   Occupation: Agricultural engineer: [employer]  Tobacco Use   Smoking status: Never   Smokeless tobacco: Never  Vaping Use   Vaping Use: Never used  Substance and Sexual Activity   Alcohol  use: No   Drug use: No   Sexual activity: Not Currently    Birth control/protection: Surgical  Other Topics Concern   Not on file  Social History Narrative   Not on file   Social Determinants of Health   Financial Resource Strain: Not on file  Food Insecurity: Not on file  Transportation Needs: Not on file  Physical Activity: Not on file  Stress: Not on file  Social Connections: Not on file  Intimate Partner Violence: Not on file     Physical Exam   Vitals:   07/02/22 0258 07/02/22 0430  BP:  134/90  Pulse:  99  Resp:  16  Temp: 97.8 F (36.6 C)   SpO2:  96%    CONSTITUTIONAL: Well-appearing, in moderate distress due to pain NEURO/PSYCH:  Alert and oriented x 3, no focal deficits EYES:  eyes equal and reactive ENT/NECK:  no LAD, no JVD CARDIO: Regular rate, well-perfused, normal S1 and S2 PULM:  CTAB no wheezing or rhonchi GI/GU: Mildly distended, rigid, diffusely tender MSK/SPINE:  No gross deformities, no edema SKIN:  no rash, atraumatic   *Additional and/or pertinent findings included in MDM  below  Diagnostic and Interventional Summary    EKG Interpretation  Date/Time:    Ventricular Rate:    PR Interval:    QRS Duration:   QT Interval:    QTC Calculation:   R Axis:     Text Interpretation:         Labs Reviewed  COMPREHENSIVE METABOLIC PANEL - Abnormal; Notable for the following components:      Result Value   Potassium 5.5 (*)    CO2 20 (*)    Glucose, Bld 107 (*)    Total Bilirubin 0.2 (*)    All other components within normal limits  URINALYSIS, ROUTINE W REFLEX MICROSCOPIC - Abnormal; Notable for the following components:   Protein, ur TRACE (*)    Leukocytes,Ua LARGE (*)    Bacteria, UA RARE (*)    All other components within normal limits  CBC - Abnormal; Notable for the following components:   Hemoglobin 11.8 (*)    All other components within normal limits  LIPASE, BLOOD    CT ABDOMEN PELVIS W CONTRAST  Final Result       Medications  sodium zirconium cyclosilicate (LOKELMA) packet 10 g (has no administration in time range)  HYDROmorphone (DILAUDID) injection 1 mg (1 mg Intravenous Given 07/01/22 2330)  ondansetron (ZOFRAN) injection 4 mg (4 mg Intravenous Given 07/01/22 2330)  sodium chloride 0.9 % bolus 1,000 mL (0 mLs Intravenous Stopped 07/02/22 0042)  famotidine (PEPCID) IVPB 20 mg premix (0 mg Intravenous Stopped 07/02/22 0002)  methylPREDNISolone sodium succinate (SOLU-MEDROL) 40 mg/mL injection 40 mg (40 mg Intravenous Given 07/01/22 2334)  diphenhydrAMINE (BENADRYL) capsule 50 mg ( Oral See Alternative 07/02/22 0241)    Or  diphenhydrAMINE (BENADRYL) injection 50 mg (50 mg Intravenous Given 07/02/22 0241)  HYDROmorphone (DILAUDID) injection 0.5 mg (0.5 mg Intravenous Given 07/02/22 0254)  ondansetron (ZOFRAN) injection 4 mg (4 mg Intravenous Given 07/02/22 0254)  iohexol (OMNIPAQUE) 300 MG/ML solution 75 mL (75 mLs Intravenous Contrast Given 07/02/22 0400)     Procedures  /  Critical Care Ultrasound ED Peripheral IV (Provider)  Date/Time: 07/01/2022 11:53 PM  Performed by: Sabas Sous, MD Authorized by: Sabas Sous, MD   Procedure details:    Indications: poor IV access     Skin Prep: chlorhexidine gluconate     Location:  Right AC   Angiocath:  20 G   Bedside Ultrasound Guided: Yes     Patient tolerated procedure without complications: Yes     Dressing applied: Yes     ED Course and Medical Decision Making  Initial Impression and Ddx Concern for acute worsening of a more chronic abdominal pain, perforated viscus considered.  Has only had noncontrast imaging in the past due to allergy, will pretreat and obtain CT abdomen with contrast.  Past medical/surgical history that increases complexity of ED encounter: None  Interpretation of Diagnostics I personally reviewed the laboratory assessment and my interpretation is as follows: No significant blood count or electrolyte disturbance, normal  kidney and renal function  CT is reassuring, constipation but no other acute process.  Patient Reassessment and Ultimate Disposition/Management     Patient is feeling better and reassessment, normal vital signs, appropriate for discharge.  Patient management required discussion with the following services or consulting groups:  None  Complexity of Problems Addressed Acute illness or injury that poses threat of life of bodily function  Additional Data Reviewed and Analyzed Further history obtained from: Further history from spouse/family member  Additional Factors  Impacting ED Encounter Risk Use of parenteral controlled substances and Consideration of hospitalization  Elmer Sow. Pilar Plate, MD [facility] Emergency Medicine [facility] [email or web address]  Final Clinical Impressions(s) / ED Diagnoses     ICD-10-CM   1. Abdominal pain, unspecified abdominal location  R10.9       ED Discharge Orders     None        Discharge Instructions Discussed with and Provided to Patient:     Discharge Instructions      You were evaluated in the Emergency Department and after careful evaluation, we did not find any emergent condition requiring admission or further testing in the hospital.  Your exam/testing today is overall reassuring.  Recommend continued follow-up with your primary care doctor or gastroenterologist.  Please return to the Emergency Department if you experience any worsening of your condition.   Thank you for allowing [place] to be a part of your care.       Sabas Sous, MD 07/02/22 [phone]

## 2022-07-01 NOTE — ED Triage Notes (Signed)
Patient here POV from Home.  Endorses ABD Pain present for Months. Seen at another ED for Same recently and discharged. Instructed to Seek Evaluation from GI but won't be able to until Late August.  Endorses Continued Nausea, Emesis. Mild Dysuria as well.   BM are becoming regular (States they are usually Once/Month). No Fevers.   NAD Noted during Triage. A&Ox4. GCS 15. Ambulatory

## 2022-07-02 ENCOUNTER — Emergency Department (HOSPITAL_BASED_OUTPATIENT_CLINIC_OR_DEPARTMENT_OTHER): Payer: 59

## 2022-07-02 MED ORDER — SODIUM ZIRCONIUM CYCLOSILICATE 10 G PO PACK
10.0000 g | PACK | Freq: Once | ORAL | Status: AC
  Administered 2022-07-02: 10 g via ORAL
  Filled 2022-07-02: qty 1

## 2022-07-02 MED ORDER — HYDROMORPHONE HCL 1 MG/ML IJ SOLN
0.5000 mg | Freq: Once | INTRAMUSCULAR | Status: AC
  Administered 2022-07-02: 0.5 mg via INTRAVENOUS
  Filled 2022-07-02: qty 1

## 2022-07-02 MED ORDER — IOHEXOL 300 MG/ML  SOLN
75.0000 mL | Freq: Once | INTRAMUSCULAR | Status: AC | PRN
  Administered 2022-07-02: 75 mL via INTRAVENOUS

## 2022-07-02 MED ORDER — ONDANSETRON HCL 4 MG/2ML IJ SOLN
4.0000 mg | Freq: Once | INTRAMUSCULAR | Status: AC
  Administered 2022-07-02: 4 mg via INTRAVENOUS
  Filled 2022-07-02: qty 2

## 2022-07-02 NOTE — ED Notes (Signed)
Pt lying awake and alert in bed- no obvious distress.  Denies active nausea; reports pain improved to 3.5-4/10 since 2nd dose of IVP Dilaudid.  Pt also reports mild itching s/o CT with contrast and observed to be scratching upper chest - Dr. Pilar Plate notified of itching via secure chat

## 2022-07-02 NOTE — Discharge Instructions (Addendum)
You were evaluated in the Emergency Department and after careful evaluation, we did not find any emergent condition requiring admission or further testing in the hospital.  Your exam/testing today is overall reassuring.  Recommend continued follow-up with your primary care doctor or gastroenterologist.  Please return to the Emergency Department if you experience any worsening of your condition.   Thank you for allowing [place] to be a part of your care.

## 2022-07-02 NOTE — ED Notes (Signed)
Patient transported to CT via stretcher.

## 2022-07-07 ENCOUNTER — Telehealth: Payer: Self-pay | Admitting: Internal Medicine

## 2022-07-07 NOTE — Telephone Encounter (Signed)
Request received to transfer GI care from outside practice to [facility].  We appreciate the interest in our practice, however at this time due to high demand from patients without established GI providers we cannot accommodate this transfer.  Ability to accommodate future transfer requests may change over time and the patient can contact us again in 6-12 months if still interested in being seen at  GI.      °

## 2022-07-07 NOTE — Telephone Encounter (Signed)
Good Morning Dr. Rhea Belton,   Patient called stating she had a referral in to be seen here at our office for abdominal pain. Patient has been a patient of yours before and was last seen in 2021 by [facility]. Patient is wanting to become a patient of yours again. Patiens records are in Epic, will you please review and advise on scheduling patient?  Thank you.

## 2022-07-08 ENCOUNTER — Encounter (INDEPENDENT_AMBULATORY_CARE_PROVIDER_SITE_OTHER): Payer: Self-pay | Admitting: Primary Care

## 2022-07-08 ENCOUNTER — Ambulatory Visit (INDEPENDENT_AMBULATORY_CARE_PROVIDER_SITE_OTHER): Payer: 59 | Admitting: Primary Care

## 2022-07-08 VITALS — BP 122/83 | HR 87 | Temp 98.2°F | Ht 60.0 in | Wt 147.8 lb

## 2022-07-08 DIAGNOSIS — E119 Type 2 diabetes mellitus without complications: Secondary | ICD-10-CM | POA: Diagnosis not present

## 2022-07-08 DIAGNOSIS — K219 Gastro-esophageal reflux disease without esophagitis: Secondary | ICD-10-CM | POA: Diagnosis not present

## 2022-07-08 LAB — POCT GLYCOSYLATED HEMOGLOBIN (HGB A1C): Hemoglobin A1C: 6.9 % — AB (ref 4.0–5.6)

## 2022-07-08 NOTE — Patient Instructions (Addendum)
What Is Gastroparesis? Normally, your stomach muscles tighten to move food through your digestive tract. If you have gastroparesis, nerve damage from high blood sugar can cause those muscles to slow down or not work at all. Your stomach doesn't empty properly, and your food may take a long time to leave your stomach.  What not to eat with diabetic gastroparesis? What to Avoid Raw and dried fruits (such as apples, berries, coconuts, figs, oranges, and persimmons) Raw vegetables (such as Brussels sprouts, corn, green beans, lettuce, potato skins, and sauerkraut) Whole-grain cereal. Nuts and seeds (including chunky nut butters and popcorn)

## 2022-07-09 LAB — COMPREHENSIVE METABOLIC PANEL
ALT: 17 IU/L (ref 0–32)
AST: 17 IU/L (ref 0–40)
Albumin/Globulin Ratio: 1.7 (ref 1.2–2.2)
Albumin: 4.7 g/dL (ref 3.8–4.9)
Alkaline Phosphatase: 112 IU/L (ref 44–121)
BUN/Creatinine Ratio: 27 — ABNORMAL HIGH (ref 9–23)
BUN: 18 mg/dL (ref 6–24)
Bilirubin Total: 0.3 mg/dL (ref 0.0–1.2)
CO2: 26 mmol/L (ref 20–29)
Calcium: 9.7 mg/dL (ref 8.7–10.2)
Chloride: 102 mmol/L (ref 96–106)
Creatinine, Ser: 0.66 mg/dL (ref 0.57–1.00)
Globulin, Total: 2.7 g/dL (ref 1.5–4.5)
Glucose: 92 mg/dL (ref 70–99)
Potassium: 4.7 mmol/L (ref 3.5–5.2)
Sodium: 141 mmol/L (ref 134–144)
Total Protein: 7.4 g/dL (ref 6.0–8.5)
eGFR: 105 mL/min/{1.73_m2} (ref >59)

## 2022-07-09 LAB — CBC WITH DIFFERENTIAL/PLATELET
Basophils Absolute: 0.1 10*3/uL (ref 0.0–0.2)
Basos: 1 %
EOS (ABSOLUTE): 0.1 10*3/uL (ref 0.0–0.4)
Eos: 2 %
Hematocrit: 37.8 % (ref 34.0–46.6)
Hemoglobin: 12.1 g/dL (ref 11.1–15.9)
Immature Grans (Abs): 0 10*3/uL (ref 0.0–0.1)
Immature Granulocytes: 0 %
Lymphocytes Absolute: 2.2 10*3/uL (ref 0.7–3.1)
Lymphs: 34 %
MCH: 26.9 pg (ref 26.6–33.0)
MCHC: 32 g/dL (ref 31.5–35.7)
MCV: 84 fL (ref 79–97)
Monocytes Absolute: 0.4 10*3/uL (ref 0.1–0.9)
Monocytes: 7 %
Neutrophils Absolute: 3.6 10*3/uL (ref 1.4–7.0)
Neutrophils: 56 %
Platelets: 313 10*3/uL (ref 150–450)
RBC: 4.5 x10E6/uL (ref 3.77–5.28)
RDW: 13 % (ref 11.7–15.4)
WBC: 6.5 10*3/uL (ref 3.4–10.8)

## 2022-07-09 LAB — LIPID PANEL
Chol/HDL Ratio: 2.8 ratio (ref 0.0–4.4)
Cholesterol, Total: 188 mg/dL (ref 100–199)
HDL: 67 mg/dL (ref >39)
LDL Chol Calc (NIH): 98 mg/dL (ref 0–99)
Triglycerides: 134 mg/dL (ref 0–149)
VLDL Cholesterol Cal: 23 mg/dL (ref 5–40)

## 2022-07-16 ENCOUNTER — Emergency Department (HOSPITAL_BASED_OUTPATIENT_CLINIC_OR_DEPARTMENT_OTHER): Payer: 59

## 2022-07-16 ENCOUNTER — Encounter (HOSPITAL_BASED_OUTPATIENT_CLINIC_OR_DEPARTMENT_OTHER): Payer: Self-pay | Admitting: Emergency Medicine

## 2022-07-16 ENCOUNTER — Emergency Department (HOSPITAL_BASED_OUTPATIENT_CLINIC_OR_DEPARTMENT_OTHER)
Admission: EM | Admit: 2022-07-16 | Discharge: 2022-07-17 | Disposition: A | Discharge status: Home / Self Care | Payer: 59 | Attending: Emergency Medicine | Admitting: Emergency Medicine

## 2022-07-16 ENCOUNTER — Other Ambulatory Visit: Payer: Self-pay

## 2022-07-16 DIAGNOSIS — R197 Diarrhea, unspecified: Secondary | ICD-10-CM | POA: Insufficient documentation

## 2022-07-16 DIAGNOSIS — R1084 Generalized abdominal pain: Secondary | ICD-10-CM | POA: Diagnosis not present

## 2022-07-16 DIAGNOSIS — R1013 Epigastric pain: Secondary | ICD-10-CM | POA: Diagnosis present

## 2022-07-16 DIAGNOSIS — R112 Nausea with vomiting, unspecified: Secondary | ICD-10-CM | POA: Insufficient documentation

## 2022-07-16 LAB — COMPREHENSIVE METABOLIC PANEL
ALT: 15 U/L (ref 0–44)
AST: 18 U/L (ref 15–41)
Albumin: 4.8 g/dL (ref 3.5–5.0)
Alkaline Phosphatase: 107 U/L (ref 38–126)
Anion gap: 11 (ref 5–15)
BUN: 19 mg/dL (ref 6–20)
CO2: 27 mmol/L (ref 22–32)
Calcium: 9.9 mg/dL (ref 8.9–10.3)
Chloride: 102 mmol/L (ref 98–111)
Creatinine, Ser: 0.85 mg/dL (ref 0.44–1.00)
GFR, Estimated: >60 mL/min (ref >60)
Glucose, Bld: 96 mg/dL (ref 70–99)
Potassium: 3.8 mmol/L (ref 3.5–5.1)
Sodium: 140 mmol/L (ref 135–145)
Total Bilirubin: 0.8 mg/dL (ref 0.3–1.2)
Total Protein: 7.8 g/dL (ref 6.5–8.1)

## 2022-07-16 LAB — CBC WITH DIFFERENTIAL/PLATELET
Abs Immature Granulocytes: 0.04 10*3/uL (ref 0.00–0.07)
Basophils Absolute: 0 10*3/uL (ref 0.0–0.1)
Basophils Relative: 0 %
Eosinophils Absolute: 0.1 10*3/uL (ref 0.0–0.5)
Eosinophils Relative: 1 %
HCT: 40.6 % (ref 36.0–46.0)
Hemoglobin: 12.8 g/dL (ref 12.0–15.0)
Immature Granulocytes: 0 %
Lymphocytes Relative: 17 %
Lymphs Abs: 1.9 10*3/uL (ref 0.7–4.0)
MCH: 26.8 pg (ref 26.0–34.0)
MCHC: 31.5 g/dL (ref 30.0–36.0)
MCV: 85.1 fL (ref 80.0–100.0)
Monocytes Absolute: 0.4 10*3/uL (ref 0.1–1.0)
Monocytes Relative: 4 %
Neutro Abs: 8.7 10*3/uL — ABNORMAL HIGH (ref 1.7–7.7)
Neutrophils Relative %: 78 %
Platelets: 385 10*3/uL (ref 150–400)
RBC: 4.77 MIL/uL (ref 3.87–5.11)
RDW: 13.6 % (ref 11.5–15.5)
WBC: 11.1 10*3/uL — ABNORMAL HIGH (ref 4.0–10.5)
nRBC: 0 % (ref 0.0–0.2)

## 2022-07-16 LAB — LIPASE, BLOOD: Lipase: 16 U/L (ref 11–51)

## 2022-07-16 MED ORDER — FENTANYL CITRATE PF 50 MCG/ML IJ SOSY
50.0000 ug | PREFILLED_SYRINGE | Freq: Once | INTRAMUSCULAR | Status: AC
  Administered 2022-07-16: 50 ug via INTRAVENOUS
  Filled 2022-07-16: qty 1

## 2022-07-16 MED ORDER — MORPHINE SULFATE (PF) 4 MG/ML IV SOLN
4.0000 mg | Freq: Once | INTRAVENOUS | Status: AC
  Administered 2022-07-17: 4 mg via INTRAVENOUS
  Filled 2022-07-16: qty 1

## 2022-07-16 MED ORDER — SODIUM CHLORIDE 0.9 % IV BOLUS
1000.0000 mL | Freq: Once | INTRAVENOUS | Status: AC
  Administered 2022-07-16: 1000 mL via INTRAVENOUS

## 2022-07-16 MED ORDER — ONDANSETRON HCL 4 MG/2ML IJ SOLN
4.0000 mg | Freq: Once | INTRAMUSCULAR | Status: AC
  Administered 2022-07-16: 4 mg via INTRAVENOUS
  Filled 2022-07-16: qty 2

## 2022-07-16 NOTE — ED Triage Notes (Signed)
Abdominal pain (3epigastric and left flank) n/v/d, oliguria. Seen for similar 2 weeks ago. Scheduled for GI end of august. Has tried heat on abdomen. Some relief No relief with phenergan Reports abdominal area is distended.

## 2022-07-16 NOTE — ED Provider Notes (Signed)
[facility] EMERGENCY DEPT Provider Note   CSN: [id number] Arrival date & time: 07/16/22  1946     History  Chief Complaint  Patient presents with   Abdominal Pain    Donna [name] is a 51 y.o. female.  Is here with epigastric abdominal pain, nausea, vomiting, diarrhea.  Concern for dehydration.  Seen here 2 weeks ago for similar.  Supposed to see GI doctor later this month.  Nothing makes it worse or better.  Tried Phenergan without much help.  History of lupus, endometriosis, DVT.  Has had abdominal hysterectomy in the past.  Nothing makes it worse or better.  The history is provided by the patient.       Home Medications Prior to Admission medications   Medication Sig Start Date End Date Taking? Authorizing Provider  acetaminophen (TYLENOL) 500 MG tablet Take 500 mg by mouth every 6 (six) hours as needed for moderate pain.    [provider]  blood glucose meter kit and supplies KIT Dispense based on patient and insurance preference. Use up to four times daily as directed. (FOR ICD-9 250.00, 250.01). 03/11/18   Raiford Noble Latif, DO  cholecalciferol (VITAMIN D) 1000 units tablet Take 1,000 Units by mouth daily.    [provider]  Dulaglutide (TRULICITY) 1.5 IR/6.7EL SOPN Inject 1.5 mg into the skin once a week. 04/06/22   Kerin Perna, NP  EPINEPHrine (EPIPEN 2-PAK) 0.3 mg/0.3 mL IJ SOAJ injection Inject 0.3 mLs (0.3 mg total) into the muscle once as needed (for severe allergic reaction). Patient taking differently: Inject 0.3 mg into the muscle once as needed for anaphylaxis. 06/28/20   Molpus, John, MD  famotidine (PEPCID) 20 MG tablet Take 1 tablet (20 mg total) by mouth 2 (two) times daily. 06/05/22   Carmin Muskrat, MD  famotidine (PEPCID) 40 MG tablet 1 hour prior to CT 01/13/22   Hollice Espy, MD  lisinopril (ZESTRIL) 10 MG tablet Take 1 tablet (10 mg total) by mouth daily. 01/05/22   Kerin Perna, NP  ondansetron  (ZOFRAN-ODT) 4 MG disintegrating tablet Take 1 tablet (4 mg total) by mouth every 8 (eight) hours as needed for nausea or vomiting. 05/03/22   Sherwood Gambler, MD  OVER THE COUNTER MEDICATION Take 2 tablets by mouth daily. Amberen    [provider]  sucralfate (CARAFATE) 1 g tablet Take 1 tablet (1 g total) by mouth 4 (four) times daily -  with meals and at bedtime. 06/05/22   Carmin Muskrat, MD  vitamin B-12 (CYANOCOBALAMIN) 1000 MCG tablet Take 1,000 mcg by mouth daily.    [provider]  dicyclomine (BENTYL) 20 MG tablet Take 1 tablet (20 mg total) by mouth 2 (two) times daily as needed for spasms. 02/20/19 06/28/20  Fawze, Mina A, PA-C  ipratropium-albuterol (DUONEB) 0.5-2.5 (3) MG/3ML SOLN Take 3 mLs by nebulization every 4 (four) hours as needed. Patient not taking: Reported on 09/22/2018 03/11/18 06/07/19  Raiford Noble Latif, DO  omeprazole (PRILOSEC) 20 MG capsule Take 1 capsule (20 mg total) by mouth 2 (two) times daily before a meal. 06/07/19 06/28/20  Pollina, Gwenyth Allegra, MD      Allergies    Bee venom, Diamox [acetazolamide], Nsaids, Omnipaque [iohexol], Tolmetin, Compazine [prochlorperazine], Iopamidol, Lactose intolerance (gi), and Reglan [metoclopramide]    Review of Systems   Review of Systems  Physical Exam Updated Vital Signs BP 126/72   Pulse 95   Temp 98.8 F (37.1 C)   Resp (!) 21  LMP 06/14/2003   SpO2 100%  Physical Exam Vitals and nursing note reviewed.  Constitutional:      General: She is not in acute distress.    Appearance: She is well-developed. She is not ill-appearing.  HENT:     Head: Normocephalic and atraumatic.     Mouth/Throat:     Mouth: Mucous membranes are moist.  Eyes:     Conjunctiva/sclera: Conjunctivae normal.  Cardiovascular:     Rate and Rhythm: Normal rate and regular rhythm.     Heart sounds: Normal heart sounds. No murmur heard. Pulmonary:     Effort: Pulmonary effort is normal. No respiratory distress.      Breath sounds: Normal breath sounds.  Abdominal:     Palpations: Abdomen is soft.     Tenderness: There is abdominal tenderness in the epigastric area. There is no guarding. Negative signs include Murphy's sign.  Musculoskeletal:        General: No swelling.     Cervical back: Neck supple.  Skin:    General: Skin is warm and dry.     Capillary Refill: Capillary refill takes less than 2 seconds.  Neurological:     Mental Status: She is alert.  Psychiatric:        Mood and Affect: Mood normal.     ED Results / Procedures / Treatments   Labs (all labs ordered are listed, but only abnormal results are displayed) Labs Reviewed  CBC WITH DIFFERENTIAL/PLATELET - Abnormal; Notable for the following components:      Result Value   WBC 11.1 (*)    Neutro Abs 8.7 (*)    All other components within normal limits  COMPREHENSIVE METABOLIC PANEL  LIPASE, BLOOD    EKG None  Radiology CT ABDOMEN PELVIS WO CONTRAST  Result Date: 07/16/2022 CLINICAL DATA:  Left lower quadrant and left flank pain, initial encounter EXAM: CT ABDOMEN AND PELVIS WITHOUT CONTRAST TECHNIQUE: Multidetector CT imaging of the abdomen and pelvis was performed following the standard protocol without IV contrast. RADIATION DOSE REDUCTION: This exam was performed according to the departmental dose-optimization program which includes automated exposure control, adjustment of the mA and/or kV according to patient size and/or use of iterative reconstruction technique. COMPARISON:  07/02/2022 FINDINGS: Lower chest: No acute abnormality. Hepatobiliary: No focal liver abnormality is seen. No gallstones, gallbladder wall thickening, or biliary dilatation. Pancreas: Unremarkable. No pancreatic ductal dilatation or surrounding inflammatory changes. Spleen: Normal in size without focal abnormality. Adrenals/Urinary Tract: Adrenal glands are within normal limits. Kidneys are well visualized bilaterally. No renal calculi or obstructive  changes are noted. Large left renal cyst is noted stable from the prior exam. This is simple in nature and no further follow-up is recommended. Previously seen right renal cyst is not well appreciated on this exam. No obstructive changes are seen. Bladder is partially distended. Stomach/Bowel: Fecal material is again seen throughout the colon consistent with a degree of constipation. No obstructive changes are seen. The appendix is within normal limits. Stomach and small bowel are unremarkable. Vascular/Lymphatic: No significant vascular findings are present. No enlarged abdominal or pelvic lymph nodes. Reproductive: Status post hysterectomy. No adnexal masses. Other: No abdominal wall hernia or abnormality. No abdominopelvic ascites. Musculoskeletal: No acute or significant osseous findings. IMPRESSION: Mild colonic constipation. No other focal abnormality is seen. No acute abnormality to correspond with the given clinical history is noted. Electronically Signed   By: Inez Catalina M.D.   On: 07/16/2022 21:30    Procedures Procedures  Medications Ordered in ED Medications  sodium chloride 0.9 % bolus 1,000 mL (1,000 mLs Intravenous New Bag/Given 07/16/22 2236)  ondansetron (ZOFRAN) injection 4 mg (4 mg Intravenous Given 07/16/22 2230)  fentaNYL (SUBLIMAZE) injection 50 mcg (50 mcg Intravenous Given 07/16/22 2229)    ED Course/ Medical Decision Making/ A&P                           Medical Decision Making Amount and/or Complexity of Data Reviewed Labs: ordered. Radiology: ordered.  Risk Prescription drug management.   Nichola Sizer Hillary is here with abdominal pain.  Worse here over the last day or 2.  Nausea vomiting diarrhea as well.  Decreased urination.  Feels like she is unable to pee but she feels like she needs to.  Supposed to see GI later this month.  History of the same.  History of lupus, endometriosis, seizures, DVT, abdominal hysterectomy.  Differential diagnosis is bowel obstruction  versus reflux versus UTI versus viral process.  We will get CBC, CMP, lipase, urinalysis, CT scan abdomen pelvis.  Will give IV fluids, IV fentanyl, IV Zofran and reevaluate.  Per my review and interpretation of labs and imaging, there is no significant anemia, electrolyte abnormality, kidney injury.  CT scan without any acute findings.  Some mild constipation.  Awaiting urinalysis.  Patient has not had IV fluids and pain medication and nausea medicine at this time yet.  Patient feeling much better after IV fluids and antiemetics and pain medicine.  Overall I suspect that she likely has some aspect of gastroparesis.  May be some chronic abdominal process as well.  She has follow-up with GI in place for EGD and further work-up.  She is got antiemetics at home.  Discharged in good condition.  This chart was dictated using voice recognition software.  Despite best efforts to proofread,  errors can occur which can change the documentation meaning.         Final Clinical Impression(s) / ED Diagnoses Final diagnoses:  Generalized abdominal pain    Rx / DC Orders ED Discharge Orders     None         Lennice Sites, DO 07/16/22 2301

## 2022-07-17 NOTE — ED Notes (Signed)
Pt agreeable with d/c plan as discussed by provider- this nurse has verbally reinforced d/c instructions and provided pt with written copy- pt acknowledges verbal understanding- denies any additional questions concerns needs- reports pain has improved and nausea has resolved since IV medications administered

## 2022-07-17 NOTE — Telephone Encounter (Signed)
Spoke with patient, advised her we were not able to accommodate her. Patient advised understanding.

## 2022-07-20 NOTE — Progress Notes (Signed)
Subjective:  Patient ID: Donna [name], female    DOB: 07-22-1971  Age: 51 y.o. MRN: [id number]  CC: No chief complaint on file.   HPI Donna [name] presents forFollow-up of diabetes. Patient does not check blood sugar at home She voiced concerns about abdominal pain with n/v. She has a schedule GI appointment upcoming  Compliant with meds - Yes Checking CBGs? No  Fasting avg -   Postprandial average -  Exercising regularly? - Yes Watching carbohydrate intake? - Yes Neuropathy ? - No Hypoglycemic events - No  - Recovers with :   Pertinent ROS:  Polyuria - No Polydipsia - No Vision problems - No  Medications as noted below. Taking them regularly without complication/adverse reaction being reported today.   History Donna [name] has a past medical history of DVT (deep venous thrombosis) ([place]), Endometriosis, Lupus (systemic lupus erythematosus) ([place]) (2007), Migraine, and Seizures ([place]).   She has a past surgical history that includes Abdominal hysterectomy; Cesarean section; and Mass excision (02/2014).   Her family history includes Alzheimer's disease in her father; Birth defects in her maternal uncle; Breast cancer in her maternal aunt and sister; CVA (age of onset: 39) in her mother; Ovarian cancer in her maternal aunt and sister; Stomach cancer in her maternal aunt and sister.She reports that she has never smoked. She has never used smokeless tobacco. She reports that she does not drink alcohol and does not use drugs.  Current Outpatient Medications on File Prior to Visit  Medication Sig Dispense Refill   acetaminophen (TYLENOL) 500 MG tablet Take 500 mg by mouth every 6 (six) hours as needed for moderate pain.     blood glucose meter kit and supplies KIT Dispense based on patient and insurance preference. Use up to four times daily as directed. (FOR ICD-9 250.00, 250.01). 1 each 0   cholecalciferol (VITAMIN D) 1000 units tablet Take 1,000 Units by mouth daily.      Dulaglutide (TRULICITY) 1.5 HB/7.1IR SOPN Inject 1.5 mg into the skin once a week. 0.5 mL 4   EPINEPHrine (EPIPEN 2-PAK) 0.3 mg/0.3 mL IJ SOAJ injection Inject 0.3 mLs (0.3 mg total) into the muscle once as needed (for severe allergic reaction). (Patient taking differently: Inject 0.3 mg into the muscle once as needed for anaphylaxis.) 1 each 1   famotidine (PEPCID) 20 MG tablet Take 1 tablet (20 mg total) by mouth 2 (two) times daily. 30 tablet 0   famotidine (PEPCID) 40 MG tablet 1 hour prior to CT 1 tablet 0   lisinopril (ZESTRIL) 10 MG tablet Take 1 tablet (10 mg total) by mouth daily. 90 tablet 3   ondansetron (ZOFRAN-ODT) 4 MG disintegrating tablet Take 1 tablet (4 mg total) by mouth every 8 (eight) hours as needed for nausea or vomiting. 10 tablet 0   OVER THE COUNTER MEDICATION Take 2 tablets by mouth daily. Amberen     sucralfate (CARAFATE) 1 g tablet Take 1 tablet (1 g total) by mouth 4 (four) times daily -  with meals and at bedtime. 21 tablet 0   vitamin B-12 (CYANOCOBALAMIN) 1000 MCG tablet Take 1,000 mcg by mouth daily.     [DISCONTINUED] dicyclomine (BENTYL) 20 MG tablet Take 1 tablet (20 mg total) by mouth 2 (two) times daily as needed for spasms. 20 tablet 0   [DISCONTINUED] ipratropium-albuterol (DUONEB) 0.5-2.5 (3) MG/3ML SOLN Take 3 mLs by nebulization every 4 (four) hours as needed. (Patient not taking: Reported on 09/22/2018) 360 mL 0   [DISCONTINUED]  omeprazole (PRILOSEC) 20 MG capsule Take 1 capsule (20 mg total) by mouth 2 (two) times daily before a meal. 28 capsule 0   No current facility-administered medications on file prior to visit.    ROS Comprehensive ROS Pertinent positive and negative noted in HPI   Objective:  BP 122/83   Pulse 87   Temp 98.2 F (36.8 C) (Oral)   Ht 5' (1.524 m)   Wt 147 lb 12.8 oz (67 kg)   LMP 06/14/2003   SpO2 98%   BMI 28.87 kg/m   BP Readings from Last 3 Encounters:  07/17/22 122/81  07/08/22 122/83  07/02/22 139/80    Wt  Readings from Last 3 Encounters:  07/08/22 147 lb 12.8 oz (67 kg)  07/01/22 149 lb 14.6 oz (68 kg)  05/03/22 150 lb (68 kg)    Physical Exam Vitals reviewed.  HENT:     Head: Normocephalic.     Right Ear: Tympanic membrane and external ear normal.     Left Ear: Tympanic membrane and external ear normal.     Nose: Nose normal.  Eyes:     Extraocular Movements: Extraocular movements intact.     Pupils: Pupils are equal, round, and reactive to light.  Cardiovascular:     Rate and Rhythm: Normal rate and regular rhythm.  Pulmonary:     Effort: Pulmonary effort is normal.     Breath sounds: Normal breath sounds.  Abdominal:     General: Bowel sounds are normal.     Palpations: Abdomen is soft.     Tenderness: There is abdominal tenderness.  Musculoskeletal:        General: Normal range of motion.     Cervical back: Normal range of motion.  Skin:    General: Skin is warm and dry.  Neurological:     Mental Status: She is alert and oriented to person, place, and time.  Psychiatric:        Mood and Affect: Mood normal.        Behavior: Behavior normal.    Lab Results  Component Value Date   HGBA1C 6.9 (A) 07/08/2022   HGBA1C 7.3 (A) 04/06/2022   HGBA1C 9.1 (A) 01/05/2022    Lab Results  Component Value Date   WBC 11.1 (H) 07/16/2022   HGB 12.8 07/16/2022   HCT 40.6 07/16/2022   PLT 385 07/16/2022   GLUCOSE 96 07/16/2022   CHOL 188 07/08/2022   TRIG 134 07/08/2022   HDL 67 07/08/2022   LDLCALC 98 07/08/2022   ALT 15 07/16/2022   AST 18 07/16/2022   NA 140 07/16/2022   K 3.8 07/16/2022   CL 102 07/16/2022   CREATININE 0.85 07/16/2022   BUN 19 07/16/2022   CO2 27 07/16/2022   TSH 0.35 09/26/2013   INR 1.1 08/21/2020   HGBA1C 6.9 (A) 07/08/2022     Assessment & Plan:   Diagnoses and all orders for this visit:  Type 2 diabetes mellitus without complication, without long-term current use of insulin (HCC) Improving on Trulicity 1.5 weekly  6.9 previously  7.Continue to monitor foods that are high in carbohydrates are the following rice, potatoes, breads, sugars, and pastas.  Reduction in the intake (eating) will assist in lowering your blood sugars.  -     Cancel: HgB A1c -     Comprehensive metabolic panel -     Lipid panel -     CBC with Differential -     HgB A1c 6.9  Gastroesophageal reflux disease, unspecified whether esophagitis present Discussed  gastroparesis, nerve damage from high blood sugar can cause those muscles to slow down or not work at all. Causing not able to empty properly, and  food may take a long time to leave your stomach. Information placed on AVS food to avoid until dx determine by GI  I have discontinued Nichola Sizer. Brenner's metFORMIN and promethazine. I am also having her maintain her cholecalciferol, cyanocobalamin, blood glucose meter kit and supplies, EPINEPHrine, acetaminophen, OVER THE COUNTER MEDICATION, lisinopril, famotidine, Trulicity, ondansetron, famotidine, and sucralfate.  No orders of the defined types were placed in this encounter.    Follow-up:   No follow-ups on file.  The above assessment and management plan was discussed with the patient. The patient verbalized understanding of and has agreed to the management plan. Patient is aware to call the clinic if symptoms fail to improve or worsen. Patient is aware when to return to the clinic for a follow-up visit. Patient educated on when it is appropriate to go to the emergency department.   Juluis Mire, NP-C

## 2022-08-03 ENCOUNTER — Ambulatory Visit (INDEPENDENT_AMBULATORY_CARE_PROVIDER_SITE_OTHER): Payer: 59 | Admitting: Primary Care

## 2022-08-06 ENCOUNTER — Encounter (INDEPENDENT_AMBULATORY_CARE_PROVIDER_SITE_OTHER): Payer: Self-pay

## 2022-08-06 ENCOUNTER — Ambulatory Visit (INDEPENDENT_AMBULATORY_CARE_PROVIDER_SITE_OTHER): Payer: 59 | Admitting: Primary Care

## 2022-08-17 ENCOUNTER — Other Ambulatory Visit (INDEPENDENT_AMBULATORY_CARE_PROVIDER_SITE_OTHER): Payer: Self-pay | Admitting: Primary Care

## 2022-08-17 DIAGNOSIS — E119 Type 2 diabetes mellitus without complications: Secondary | ICD-10-CM

## 2022-09-14 ENCOUNTER — Telehealth (INDEPENDENT_AMBULATORY_CARE_PROVIDER_SITE_OTHER): Payer: Self-pay | Admitting: Primary Care

## 2022-09-14 ENCOUNTER — Telehealth (INDEPENDENT_AMBULATORY_CARE_PROVIDER_SITE_OTHER): Payer: Commercial Managed Care - HMO | Admitting: Primary Care

## 2022-09-14 DIAGNOSIS — I10 Essential (primary) hypertension: Secondary | ICD-10-CM | POA: Diagnosis not present

## 2022-09-14 DIAGNOSIS — F419 Anxiety disorder, unspecified: Secondary | ICD-10-CM

## 2022-09-14 DIAGNOSIS — E119 Type 2 diabetes mellitus without complications: Secondary | ICD-10-CM

## 2022-09-14 DIAGNOSIS — F32A Depression, unspecified: Secondary | ICD-10-CM

## 2022-09-14 NOTE — Progress Notes (Signed)
[facility]  Virtual Visit via Telephone Note  I connected with Donna [name], on 09/14/2022 at 11:04 AM by telephone and verified that I am speaking with the correct person using two identifiers.   Consent: I discussed the limitations, risks, security and privacy concerns of performing an evaluation and management service by telephone and the availability of in person appointments. I also discussed with the patient that there may be a patient responsible charge related to this service. The patient expressed understanding and agreed to proceed.   Location of Patient: work  Biomedical scientist of Provider: [facility] [place]   Persons participating in Telemedicine visit: [facility] Juluis Mire,  NP  History of Present Illness: Donna [name] is a 51 year old female she is having a lot psychosocial and physical problems.  She is also depressed and cries daily.  She has rheumatoid arthritis paperwork has been sent to do for management but she has not been able to receive an appointment.  Advise to take a copy of the records that they have received present at the office and make an appointment.  Her rheumatoid arthritis is causing difficulty walking, sitting and doing activities of daily living not to include working.  She also cares for her mother that is on dialysis.  Per patient her mother is never ready for dialysis via the scat bus and missed visit therefore she has to take her to dialysis and pick her up 3 days a week and try to keep her job.  Asked if patient had PCS she answered yes.  Advise to have PCS on her dialysis days so she can be bathe, fed, and closed on and they can assist her with getting on the bus.  This can take some weight off of her shoulders.  When her mother gets mad at her for not doing what she as she would tell her take me to a nursing home and do not come and visit me.  Advise on a day off for her to go  visit nursing home to see what they like and if that is really what she wants to do.  This may stop her idle threats or decides she wants to be in a facility.   Past Medical History:  Diagnosis Date   DVT (deep venous thrombosis) (HCC)    right arm   Endometriosis    Lupus (systemic lupus erythematosus) ([place]) 2007   Migraine    Seizures ([place])    last sz 06/2013, no meds   Allergies  Allergen Reactions   Bee Venom Anaphylaxis   Diamox [Acetazolamide] Anaphylaxis   Nsaids Anaphylaxis   Omnipaque [Iohexol] Anaphylaxis   Tolmetin Anaphylaxis   Compazine [Prochlorperazine] Hives   Iopamidol Hives   Lactose Intolerance (Gi) Nausea And Vomiting   Reglan [Metoclopramide] Hives   Current Outpatient Medications on File Prior to Visit  Medication Sig Dispense Refill   acetaminophen (TYLENOL) 500 MG tablet Take 500 mg by mouth every 6 (six) hours as needed for moderate pain.     blood glucose meter kit and supplies KIT Dispense based on patient and insurance preference. Use up to four times daily as directed. (FOR ICD-9 250.00, 250.01). 1 each 0   cholecalciferol (VITAMIN D) 1000 units tablet Take 1,000 Units by mouth daily.     Dulaglutide (TRULICITY) 1.5 AT/5.5DD SOPN Inject 1.5 mg into the skin once a week. 0.5 mL 4   EPINEPHrine (EPIPEN 2-PAK) 0.3 mg/0.3 mL IJ  SOAJ injection Inject 0.3 mLs (0.3 mg total) into the muscle once as needed (for severe allergic reaction). (Patient taking differently: Inject 0.3 mg into the muscle once as needed for anaphylaxis.) 1 each 1   famotidine (PEPCID) 20 MG tablet Take 1 tablet (20 mg total) by mouth 2 (two) times daily. 30 tablet 0   famotidine (PEPCID) 40 MG tablet 1 hour prior to CT 1 tablet 0   lisinopril (ZESTRIL) 10 MG tablet Take 1 tablet (10 mg total) by mouth daily. 90 tablet 3   ondansetron (ZOFRAN-ODT) 4 MG disintegrating tablet Take 1 tablet (4 mg total) by mouth every 8 (eight) hours as needed for nausea or vomiting. 10 tablet 0   OVER THE  COUNTER MEDICATION Take 2 tablets by mouth daily. Amberen     sucralfate (CARAFATE) 1 g tablet Take 1 tablet (1 g total) by mouth 4 (four) times daily -  with meals and at bedtime. 21 tablet 0   vitamin B-12 (CYANOCOBALAMIN) 1000 MCG tablet Take 1,000 mcg by mouth daily.     [DISCONTINUED] dicyclomine (BENTYL) 20 MG tablet Take 1 tablet (20 mg total) by mouth 2 (two) times daily as needed for spasms. 20 tablet 0   [DISCONTINUED] ipratropium-albuterol (DUONEB) 0.5-2.5 (3) MG/3ML SOLN Take 3 mLs by nebulization every 4 (four) hours as needed. (Patient not taking: Reported on 09/22/2018) 360 mL 0   [DISCONTINUED] omeprazole (PRILOSEC) 20 MG capsule Take 1 capsule (20 mg total) by mouth 2 (two) times daily before a meal. 28 capsule 0   No current facility-administered medications on file prior to visit.   Observations/Objective: No vital signs taken Assessment and Plan: Diagnoses and all orders for this visit:  Anxiety and depression Will refer patient to clinical social work and on follow-up appointment will discuss alternate methods and medication for anxiety and depression  Essential hypertension BP goal - < 130/80 currently on lisinopril 10 mg daily for renal protection and BP control Explained that having normal blood pressure is the goal and medications are helping to get to goal and maintain normal blood pressure. DIET: Limit salt intake, read nutrition labels to check salt content, limit fried and high fatty foods  Avoid using multisymptom OTC cold preparations that generally contain sudafed which can rise BP. Consult with pharmacist on best cold relief products to use for persons with HTN EXERCISE Discussed incorporating exercise such as walking - 30 minutes most days of the week and can do in 10 minute intervals     Type 2 diabetes mellitus without complication, without long-term current use of insulin (HCC) Last A1c in July was 6.9 and she is currently on Trulicity 1.5 weekly.  She  continues to monitor her carbohydrates and tries to exercise where her body is not all full of pain.  We will check her A1c on follow-up visit next month    provided 4o minutes of non-face-to-face time during this encounter including median intraservice time, reviewing previous notes, labs, imaging, medications and explaining diagnosis and management.    Kerin Perna, NP

## 2022-09-29 NOTE — Telephone Encounter (Signed)
LCSWA called patient today to introduce herself and to assess patients' mental health needs. Patient was able to discuss her needs of being overwhelmed. Reports she has a strong support group to assist her. Patient was referred by PCP for anxiety and depression. Pt was asked to contact LCSWA if she needs to reconsider.

## 2022-12-16 ENCOUNTER — Encounter (HOSPITAL_BASED_OUTPATIENT_CLINIC_OR_DEPARTMENT_OTHER): Payer: Self-pay

## 2022-12-16 ENCOUNTER — Other Ambulatory Visit: Payer: Self-pay

## 2022-12-16 DIAGNOSIS — W19XXXA Unspecified fall, initial encounter: Secondary | ICD-10-CM | POA: Insufficient documentation

## 2022-12-16 DIAGNOSIS — M25551 Pain in right hip: Secondary | ICD-10-CM | POA: Diagnosis not present

## 2022-12-16 DIAGNOSIS — S161XXA Strain of muscle, fascia and tendon at neck level, initial encounter: Secondary | ICD-10-CM | POA: Insufficient documentation

## 2022-12-16 DIAGNOSIS — S39012A Strain of muscle, fascia and tendon of lower back, initial encounter: Secondary | ICD-10-CM | POA: Diagnosis not present

## 2022-12-16 DIAGNOSIS — S199XXA Unspecified injury of neck, initial encounter: Secondary | ICD-10-CM | POA: Diagnosis present

## 2022-12-16 NOTE — ED Triage Notes (Signed)
Patient here POV from Home.  Endorses her Mother had a Fall today and the Patient had to assist her from the Floor unassisted. Patient did Fall during this Process. No Anticoagulants. No LOC. No Known Head Injury.   Endorses pain from Neck to Mid back and around to Mid ABD. History of Lupus as well and endorses some Joint Pain related to same even prior to today.   NAD Noted during Triage. A&Ox4. GCS 15. Ambulatory.

## 2022-12-17 ENCOUNTER — Emergency Department (HOSPITAL_BASED_OUTPATIENT_CLINIC_OR_DEPARTMENT_OTHER): Payer: 59

## 2022-12-17 ENCOUNTER — Emergency Department (HOSPITAL_BASED_OUTPATIENT_CLINIC_OR_DEPARTMENT_OTHER): Payer: 59 | Admitting: Radiology

## 2022-12-17 ENCOUNTER — Emergency Department (HOSPITAL_BASED_OUTPATIENT_CLINIC_OR_DEPARTMENT_OTHER)
Admission: EM | Admit: 2022-12-17 | Discharge: 2022-12-17 | Disposition: A | Discharge status: Home / Self Care | Payer: 59 | Attending: Emergency Medicine | Admitting: Emergency Medicine

## 2022-12-17 DIAGNOSIS — S161XXA Strain of muscle, fascia and tendon at neck level, initial encounter: Secondary | ICD-10-CM

## 2022-12-17 DIAGNOSIS — M25551 Pain in right hip: Secondary | ICD-10-CM

## 2022-12-17 DIAGNOSIS — W19XXXA Unspecified fall, initial encounter: Secondary | ICD-10-CM

## 2022-12-17 DIAGNOSIS — S39012A Strain of muscle, fascia and tendon of lower back, initial encounter: Secondary | ICD-10-CM

## 2022-12-17 LAB — BASIC METABOLIC PANEL
Anion gap: 12 (ref 5–15)
BUN: 14 mg/dL (ref 6–20)
CO2: 24 mmol/L (ref 22–32)
Calcium: 10.6 mg/dL — ABNORMAL HIGH (ref 8.9–10.3)
Chloride: 100 mmol/L (ref 98–111)
Creatinine, Ser: 0.72 mg/dL (ref 0.44–1.00)
GFR, Estimated: >60 mL/min (ref >60)
Glucose, Bld: 156 mg/dL — ABNORMAL HIGH (ref 70–99)
Potassium: 3.4 mmol/L — ABNORMAL LOW (ref 3.5–5.1)
Sodium: 136 mmol/L (ref 135–145)

## 2022-12-17 LAB — CBC
HCT: 42.1 % (ref 36.0–46.0)
Hemoglobin: 13.6 g/dL (ref 12.0–15.0)
MCH: 26.8 pg (ref 26.0–34.0)
MCHC: 32.3 g/dL (ref 30.0–36.0)
MCV: 83 fL (ref 80.0–100.0)
Platelets: 354 10*3/uL (ref 150–400)
RBC: 5.07 MIL/uL (ref 3.87–5.11)
RDW: 13.1 % (ref 11.5–15.5)
WBC: 7.6 10*3/uL (ref 4.0–10.5)
nRBC: 0 % (ref 0.0–0.2)

## 2022-12-17 MED ORDER — HYDROMORPHONE HCL 1 MG/ML IJ SOLN
0.5000 mg | Freq: Once | INTRAMUSCULAR | Status: AC
  Administered 2022-12-17: 0.5 mg via INTRAVENOUS
  Filled 2022-12-17: qty 1

## 2022-12-17 MED ORDER — METHOCARBAMOL 500 MG PO TABS
500.0000 mg | ORAL_TABLET | Freq: Once | ORAL | Status: AC
  Administered 2022-12-17: 500 mg via ORAL
  Filled 2022-12-17: qty 1

## 2022-12-17 MED ORDER — HYDROCODONE-ACETAMINOPHEN 5-325 MG PO TABS: 1.0000 | ORAL_TABLET | Freq: Four times a day (QID) | ORAL | 0 refills | Status: DC | PRN

## 2022-12-17 MED ORDER — ONDANSETRON HCL 4 MG/2ML IJ SOLN
4.0000 mg | Freq: Once | INTRAMUSCULAR | Status: AC
  Administered 2022-12-17: 4 mg via INTRAVENOUS
  Filled 2022-12-17: qty 2

## 2022-12-17 MED ORDER — METHOCARBAMOL 500 MG PO TABS: 500.0000 mg | ORAL_TABLET | Freq: Two times a day (BID) | ORAL | 0 refills | Status: DC

## 2022-12-17 NOTE — ED Provider Notes (Signed)
[facility] Provider Note   CSN: [id number] Arrival date & time: 12/16/22  2125     History  Chief Complaint  Patient presents with   Pain    Donna [name] is a 52 y.o. female.  The history is provided by the patient.  Patient with previous history of VTE, not on anticoagulation, history of endometriosis, history of lupus presents after a fall.  She reports earlier in the day on January 3 she found her mother in the floor.  She went to pick up her mother to take her to dialysis.  It took her over 45 minutes to get her mother off the floor.  She tried picking her up from the back and she fell backwards with her mother landing on her.  Since that time she has had diffuse pain in her neck and back.  She denies pain radiating to her right leg.  No head injury or LOC.  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  No severe chest or abdominal pain. No focal weakness reported   Patient reports she has a history of lupus & has been having arthralgias over the past several weeks prior to the incident Past Medical History:  Diagnosis Date   DVT (deep venous thrombosis) ([place])    right arm   Endometriosis    Lupus (systemic lupus erythematosus) ([place]) 2007   Migraine    Seizures ([place])    last sz 06/2013, no meds    Home Medications Prior to Admission medications   Medication Sig Start Date End Date Taking? Authorizing Provider  HYDROcodone-acetaminophen (NORCO/VICODIN) 5-325 MG tablet Take 1 tablet by mouth every 6 (six) hours as needed for severe pain. 12/17/22  Yes Ripley Fraise, MD  methocarbamol (ROBAXIN) 500 MG tablet Take 1 tablet (500 mg total) by mouth 2 (two) times daily. 12/17/22  Yes Ripley Fraise, MD  acetaminophen (TYLENOL) 500 MG tablet Take 500 mg by mouth every 6 (six) hours as needed for moderate pain.    [provider]  blood glucose meter kit and supplies KIT Dispense based on patient and  insurance preference. Use up to four times daily as directed. (FOR ICD-9 250.00, 250.01). 03/11/18   Raiford Noble Latif, DO  cholecalciferol (VITAMIN D) 1000 units tablet Take 1,000 Units by mouth daily.    [provider]  Dulaglutide (TRULICITY) 1.5 TK/2.4OX SOPN Inject 1.5 mg into the skin once a week. 04/06/22   Kerin Perna, NP  EPINEPHrine (EPIPEN 2-PAK) 0.3 mg/0.3 mL IJ SOAJ injection Inject 0.3 mLs (0.3 mg total) into the muscle once as needed (for severe allergic reaction). Patient taking differently: Inject 0.3 mg into the muscle once as needed for anaphylaxis. 06/28/20   Molpus, John, MD  famotidine (PEPCID) 20 MG tablet Take 1 tablet (20 mg total) by mouth 2 (two) times daily. 06/05/22   Carmin Muskrat, MD  famotidine (PEPCID) 40 MG tablet 1 hour prior to CT 01/13/22   Hollice Espy, MD  lisinopril (ZESTRIL) 10 MG tablet Take 1 tablet (10 mg total) by mouth daily. 01/05/22   Kerin Perna, NP  ondansetron (ZOFRAN-ODT) 4 MG disintegrating tablet Take 1 tablet (4 mg total) by mouth every 8 (eight) hours as needed for nausea or vomiting. 05/03/22   Sherwood Gambler, MD  OVER THE COUNTER MEDICATION Take 2 tablets by mouth daily. Amberen    [provider]  sucralfate (CARAFATE) 1 g tablet Take  1 tablet (1 g total) by mouth 4 (four) times daily -  with meals and at bedtime. 06/05/22   Carmin Muskrat, MD  vitamin B-12 (CYANOCOBALAMIN) 1000 MCG tablet Take 1,000 mcg by mouth daily.    [provider]  dicyclomine (BENTYL) 20 MG tablet Take 1 tablet (20 mg total) by mouth 2 (two) times daily as needed for spasms. 02/20/19 06/28/20  Fawze, Mina A, PA-C  ipratropium-albuterol (DUONEB) 0.5-2.5 (3) MG/3ML SOLN Take 3 mLs by nebulization every 4 (four) hours as needed. Patient not taking: Reported on 09/22/2018 03/11/18 06/07/19  Raiford Noble Latif, DO  omeprazole (PRILOSEC) 20 MG capsule Take 1 capsule (20 mg total) by mouth 2 (two) times daily before a meal. 06/07/19  06/28/20  Pollina, Gwenyth Allegra, MD      Allergies    Bee venom, Diamox [acetazolamide], Nsaids, Omnipaque [iohexol], Tolmetin, Compazine [prochlorperazine], Iopamidol, Lactose intolerance (gi), and Reglan [metoclopramide]    Review of Systems   Review of Systems  Musculoskeletal:  Positive for arthralgias.    Physical Exam Updated Vital Signs BP 137/82   Pulse 79   Temp 98.6 F (37 C)   Resp 18   Ht 1.524 m (5')   Wt 67 kg   LMP 06/14/2003   SpO2 100%   BMI 28.85 kg/m  Physical Exam CONSTITUTIONAL: Well developed/well nourished, anxious HEAD: Normocephalic/atraumatic EYES: EOMI/PERRL ENMT: Mucous membranes moist NECK: Cervical collar in place SPINE/BACK: Diffuse cervical/thoracic/lumbar tenderness CV: S1/S2 noted, no murmurs/rubs/gallops noted LUNGS: Lungs are clear to auscultation bilaterally, no apparent distress ABDOMEN: soft, nontender, no bruising NEURO: Pt is awake/alert/appropriate, moves all extremitiesx4.  No facial droop.  She is able to move all extremities, but limited due to pain in the right leg EXTREMITIES: pulses normal/equal, full ROM Mild tenderness range of motion right hip All other extremities/joints palpated/ranged and nontender No obvious joint effusions SKIN: warm, color normal PSYCH: Anxious  ED Results / Procedures / Treatments   Labs (all labs ordered are listed, but only abnormal results are displayed) Labs Reviewed  BASIC METABOLIC PANEL - Abnormal; Notable for the following components:      Result Value   Potassium 3.4 (*)    Glucose, Bld 156 (*)    Calcium 10.6 (*)    All other components within normal limits  CBC    EKG None  Radiology CT Cervical Spine Wo Contrast  Result Date: 12/17/2022 CLINICAL DATA:  Fall, neck trauma. EXAM: CT CERVICAL SPINE WITHOUT CONTRAST TECHNIQUE: Multidetector CT imaging of the cervical spine was performed without intravenous contrast. Multiplanar CT image reconstructions were also generated.  RADIATION DOSE REDUCTION: This exam was performed according to the departmental dose-optimization program which includes automated exposure control, adjustment of the mA and/or kV according to patient size and/or use of iterative reconstruction technique. COMPARISON:  11/20/2018. FINDINGS: Alignment: Normal. Skull base and vertebrae: No acute fracture. No primary bone lesion or focal pathologic process. Soft tissues and spinal canal: No prevertebral fluid or swelling. No visible canal hematoma. Disc levels:  Mild facet arthropathy bilaterally. Upper chest: No acute abnormality. Other: None. IMPRESSION: No acute fracture or subluxation. Electronically Signed   By: Brett Fairy M.D.   On: 12/17/2022 04:47   DG Lumbar Spine Complete  Result Date: 12/17/2022 CLINICAL DATA:  Fall. EXAM: LUMBAR SPINE - COMPLETE 4+ VIEW; THORACIC SPINE 2 VIEWS COMPARISON:  05/22/2018. FINDINGS: Thoracic spine: No evidence of acute fracture in the thoracic spine. Alignment is normal. Mild degenerative changes are noted in the mid to  upper thoracic spine. Lumbar spine: There is no evidence of lumbar spine fracture. Alignment is normal. Intervertebral disc spaces are maintained. IMPRESSION: No acute fracture in the thoracic or lumbar spine. Electronically Signed   By: Brett Fairy M.D.   On: 12/17/2022 04:44   DG Thoracic Spine 2 View  Result Date: 12/17/2022 CLINICAL DATA:  Fall. EXAM: LUMBAR SPINE - COMPLETE 4+ VIEW; THORACIC SPINE 2 VIEWS COMPARISON:  05/22/2018. FINDINGS: Thoracic spine: No evidence of acute fracture in the thoracic spine. Alignment is normal. Mild degenerative changes are noted in the mid to upper thoracic spine. Lumbar spine: There is no evidence of lumbar spine fracture. Alignment is normal. Intervertebral disc spaces are maintained. IMPRESSION: No acute fracture in the thoracic or lumbar spine. Electronically Signed   By: Brett Fairy M.D.   On: 12/17/2022 04:44   DG Hip Unilat W or Wo Pelvis 2-3 Views  Right  Result Date: 12/17/2022 CLINICAL DATA:  Fall. EXAM: DG HIP (WITH OR WITHOUT PELVIS) 2-3V RIGHT COMPARISON:  07/10/2021. FINDINGS: There is no evidence of hip fracture or dislocation. Joint space is maintained at the hips bilaterally. Mild degenerative changes are noted at the sacroiliac joints. IMPRESSION: No acute fracture or dislocation. Electronically Signed   By: Brett Fairy M.D.   On: 12/17/2022 04:42   DG Chest 2 View  Result Date: 12/17/2022 CLINICAL DATA:  Fall. EXAM: CHEST - 2 VIEW COMPARISON:  05/03/2022. FINDINGS: The heart size and mediastinal contours are within normal limits. No consolidation, effusion, or pneumothorax. No acute osseous abnormality. IMPRESSION: No active cardiopulmonary disease. Electronically Signed   By: Brett Fairy M.D.   On: 12/17/2022 04:40    Procedures Procedures    Medications Ordered in ED Medications  methocarbamol (ROBAXIN) tablet 500 mg (has no administration in time range)  HYDROmorphone (DILAUDID) injection 0.5 mg (0.5 mg Intravenous Given 12/17/22 0352)  ondansetron (ZOFRAN) injection 4 mg (4 mg Intravenous Given 12/17/22 0350)  HYDROmorphone (DILAUDID) injection 0.5 mg (0.5 mg Intravenous Given 12/17/22 0502)    ED Course/ Medical Decision Making/ A&P Clinical Course as of 12/17/22 0533  Thu Dec 17, 2022  0510 All imaging is negative.  Patient reports pain returned.  Will treat and reassess [DW]  0532 Overall patient is improved.  No acute fractures noted.  No signs of any chest or abdominal trauma.  No signs of head trauma [DW]  0532 Patient is improved range of motion of her right leg.  She has already been ambulatory.  Patient suspects that the fall has flared up her arthralgias from lupus. [DW]  5701 Patient does have some arthralgias but likely is muscle spasms.  Will give short course of Vicodin as well as Robaxin.  I discussed that it is not safe for her to both the same time and use caution. [DW]    Clinical Course User Index [DW]  Ripley Fraise, MD                           Medical Decision Making Amount and/or Complexity of Data Reviewed Labs: ordered. Radiology: ordered.  Risk Prescription drug management.   This patient presents to the ED for concern of fall with back and neck pain, this involves an extensive number of treatment options, and is a complaint that carries with it a high risk of complications and morbidity.  The differential diagnosis includes but is not limited to cervical spine fracture, thoracic spine fracture, lumbar fracture, muscle strain  Comorbidities that complicate the patient evaluation: Patient's presentation is complicated by their history of lupus   Additional history obtained: Additional history obtained from family Records reviewed Primary Care Documents  Lab Tests: I Ordered, and personally interpreted labs.  The pertinent results include: Labs revealing mild hyperglycemia  Imaging Studies ordered: I ordered imaging studies including CT scan cervical spine and X-ray thoracic and lumbar spine, chest x-ray and right hip   I independently visualized and interpreted imaging which showed no acute injuries I agree with the radiologist interpretation   Medicines ordered and prescription drug management: I ordered medication including IV Dilaudid for pain Reevaluation of the patient after these medicines showed that the patient    improved   Reevaluation: After the interventions noted above, I reevaluated the patient and found that they have :improved  Complexity of problems addressed: Patient's presentation is most consistent with  acute complicated illness/injury requiring diagnostic workup  Disposition: After consideration of the diagnostic results and the patient's response to treatment,  I feel that the patent would benefit from discharge   .           Final Clinical Impression(s) / ED Diagnoses Final diagnoses:  Fall, initial encounter  Strain of neck  muscle, initial encounter  Strain of lumbar region, initial encounter  Pain of right hip joint    Rx / DC Orders ED Discharge Orders          Ordered    HYDROcodone-acetaminophen (NORCO/VICODIN) 5-325 MG tablet  Every 6 hours PRN        12/17/22 0531    methocarbamol (ROBAXIN) 500 MG tablet  2 times daily        12/17/22 0531              Ripley Fraise, MD 12/17/22 [phone]

## 2022-12-17 NOTE — Discharge Instructions (Addendum)
You cannot take these medications at the same time.  They will both make you very drowsy, please take caution while using them  SEEK IMMEDIATE MEDICAL ATTENTION IF: New numbness, tingling, weakness, or problem with the use of your arms or legs.  Severe back pain not relieved with medications.  Change in bowel or bladder control (if you lose control of stool or urine, or if you are unable to urinate) Increasing pain in any areas of the body (such as chest or abdominal pain).  Shortness of breath, dizziness or fainting.  Nausea (feeling sick to your stomach), vomiting, fever, or sweats.

## 2023-01-18 ENCOUNTER — Telehealth: Payer: Self-pay | Admitting: Emergency Medicine

## 2023-01-18 MED ORDER — TRULICITY 0.75 MG/0.5ML ~~LOC~~ SOAJ: 0.7500 mg | SUBCUTANEOUS | 1 refills | Status: AC

## 2023-01-18 NOTE — Telephone Encounter (Signed)
Rx sent 

## 2023-01-18 NOTE — Telephone Encounter (Signed)
Copied from [place] [id number]. Topic: General - Inquiry >> Jan 18, 2023  3:25 PM Rosanne Ashing P wrote: Reason for CRM: pt is calling about her rx for Trulicity.  She said she is out and the pharmacy is telling her her they do not have in stock the dose that she is prescribed.  They told a new prescription needs to be sent in.  They told her they have .75.  [place]   CB#  [phone]

## 2023-01-18 NOTE — Addendum Note (Signed)
Addended by: Daisy Blossom, Annie Main L on: 01/18/2023 03:52 PM   Modules accepted: Orders

## 2023-02-02 ENCOUNTER — Other Ambulatory Visit: Payer: Self-pay

## 2023-02-02 ENCOUNTER — Emergency Department (HOSPITAL_BASED_OUTPATIENT_CLINIC_OR_DEPARTMENT_OTHER): Payer: 59 | Admitting: Radiology

## 2023-02-02 ENCOUNTER — Encounter (HOSPITAL_BASED_OUTPATIENT_CLINIC_OR_DEPARTMENT_OTHER): Payer: Self-pay | Admitting: Emergency Medicine

## 2023-02-02 ENCOUNTER — Emergency Department (HOSPITAL_BASED_OUTPATIENT_CLINIC_OR_DEPARTMENT_OTHER)
Admission: EM | Admit: 2023-02-02 | Discharge: 2023-02-02 | Disposition: A | Discharge status: Home / Self Care | Payer: 59 | Attending: Emergency Medicine | Admitting: Emergency Medicine

## 2023-02-02 DIAGNOSIS — M5441 Lumbago with sciatica, right side: Secondary | ICD-10-CM | POA: Insufficient documentation

## 2023-02-02 DIAGNOSIS — M549 Dorsalgia, unspecified: Secondary | ICD-10-CM | POA: Diagnosis present

## 2023-02-02 DIAGNOSIS — X500XXA Overexertion from strenuous movement or load, initial encounter: Secondary | ICD-10-CM | POA: Diagnosis not present

## 2023-02-02 MED ORDER — LIDOCAINE 5 % EX PTCH
1.0000 | MEDICATED_PATCH | CUTANEOUS | Status: DC
  Administered 2023-02-02: 1 via TRANSDERMAL
  Filled 2023-02-02: qty 1

## 2023-02-02 MED ORDER — OXYCODONE HCL 5 MG PO TABS
5.0000 mg | ORAL_TABLET | Freq: Once | ORAL | Status: AC
  Administered 2023-02-02: 5 mg via ORAL
  Filled 2023-02-02: qty 1

## 2023-02-02 MED ORDER — PREDNISONE 20 MG PO TABS: 40.0000 mg | ORAL_TABLET | Freq: Every day | ORAL | 0 refills | Status: AC

## 2023-02-02 MED ORDER — DIAZEPAM 5 MG PO TABS
5.0000 mg | ORAL_TABLET | Freq: Once | ORAL | Status: AC
  Administered 2023-02-02: 5 mg via ORAL
  Filled 2023-02-02: qty 1

## 2023-02-02 MED ORDER — CYCLOBENZAPRINE HCL 10 MG PO TABS: 10.0000 mg | ORAL_TABLET | Freq: Two times a day (BID) | ORAL | 0 refills | Status: AC | PRN

## 2023-02-02 MED ORDER — ACETAMINOPHEN 500 MG PO TABS
1000.0000 mg | ORAL_TABLET | Freq: Once | ORAL | Status: AC
  Administered 2023-02-02: 1000 mg via ORAL
  Filled 2023-02-02: qty 2

## 2023-02-02 NOTE — Discharge Instructions (Addendum)
I have prescribed muscle relaxers for your pain, please do not drink or drive while taking this medications as it can make you drowsy.  I have also prescribed steroids, be aware this medication can cause insomnia, appetite changes.    Please follow-up with PCP in 1 week for reevaluation of your symptoms.If you experience any bowel or bladder incontinence, fever, worsening in your symptoms please return to the ED.

## 2023-02-02 NOTE — ED Notes (Signed)
All appropriate discharge materials reviewed at length with patient. Time for questions provided. Pt has no other questions at this time and verbalizes understanding of all provided materials.  

## 2023-02-02 NOTE — ED Provider Notes (Signed)
[facility] Provider Note   CSN: [id number] Arrival date & time: 02/02/23  1601     History  Chief Complaint  Patient presents with   Back Pain    Donna [name] is a 52 y.o. female.  52 year old female with a past medical history of lumbar spine issues presents to the ED with a chief complaint of right-sided back pain which began approximately a couple of days ago.  Patient reports similar episodes in the past when she had to pick up her mother from the floor, reports she injured her back then.  She was evaluated by PCP and treated for pain which later resolved.  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  She endorses worsening pain with ambulation along with weightbearing.  She denies any urinary retention, bowel incontinence, fever, prior history of IV drug use or saddle anesthesia.  The history is provided by the patient and medical records.  Back Pain Associated symptoms: no fever        Home Medications Prior to Admission medications   Medication Sig Start Date End Date Taking? Authorizing Provider  cyclobenzaprine (FLEXERIL) 10 MG tablet Take 1 tablet (10 mg total) by mouth 2 (two) times daily as needed for up to 7 days for muscle spasms. 02/02/23 02/09/23 Yes Vickki Igou, Beverley Fiedler, PA-C  predniSONE (DELTASONE) 20 MG tablet Take 2 tablets (40 mg total) by mouth daily for 5 days. 02/02/23 02/07/23 Yes Adrean Findlay, Beverley Fiedler, PA-C  acetaminophen (TYLENOL) 500 MG tablet Take 500 mg by mouth every 6 (six) hours as needed for moderate pain.    [provider]  blood glucose meter kit and supplies KIT Dispense based on patient and insurance preference. Use up to four times daily as directed. (FOR ICD-9 250.00, 250.01). 03/11/18   Raiford Noble Latif, DO  cholecalciferol (VITAMIN D) 1000 units tablet Take 1,000 Units by mouth  daily.    [provider]  Dulaglutide (TRULICITY) [id number] [id number] SOPN Inject 0.75 mg into the skin once a week. 01/18/23   Charlott Rakes, MD  EPINEPHrine (EPIPEN 2-PAK) 0.3 mg/0.3 mL IJ SOAJ injection Inject 0.3 mLs (0.3 mg total) into the muscle once as needed (for severe allergic reaction). Patient taking differently: Inject 0.3 mg into the muscle once as needed for anaphylaxis. 06/28/20   Molpus, John, MD  famotidine (PEPCID) 20 MG tablet Take 1 tablet (20 mg total) by mouth 2 (two) times daily. 06/05/22   Carmin Muskrat, MD  famotidine (PEPCID) 40 MG tablet 1 hour prior to CT 01/13/22   Hollice Espy, MD  HYDROcodone-acetaminophen (NORCO/VICODIN) 5-325 MG tablet Take 1 tablet by mouth every 6 (six) hours as needed for severe pain. 12/17/22   Ripley Fraise, MD  lisinopril (ZESTRIL) 10 MG tablet Take 1 tablet (10 mg total) by mouth daily. 01/05/22   Kerin Perna, NP  methocarbamol (ROBAXIN) 500 MG tablet Take 1 tablet (500 mg total) by mouth 2 (two) times daily. 12/17/22   Ripley Fraise, MD  ondansetron (ZOFRAN-ODT) 4 MG disintegrating tablet Take 1 tablet (4 mg total) by mouth every 8 (eight) hours as needed for nausea or vomiting. 05/03/22   Sherwood Gambler, MD  OVER THE COUNTER MEDICATION Take 2 tablets by mouth daily. Amberen    [provider]  sucralfate (CARAFATE) 1 g tablet Take 1 tablet (1 g total) by mouth  4 (four) times daily -  with meals and at bedtime. 06/05/22   Carmin Muskrat, MD  vitamin B-12 (CYANOCOBALAMIN) 1000 MCG tablet Take 1,000 mcg by mouth daily.    [provider]  dicyclomine (BENTYL) 20 MG tablet Take 1 tablet (20 mg total) by mouth 2 (two) times daily as needed for spasms. 02/20/19 06/28/20  Fawze, Mina A, PA-C  ipratropium-albuterol (DUONEB) 0.5-2.5 (3) MG/3ML SOLN Take 3 mLs by nebulization every 4 (four) hours as needed. Patient not taking: Reported on 09/22/2018 03/11/18 06/07/19  Raiford Noble Latif, DO  omeprazole (PRILOSEC) 20 MG  capsule Take 1 capsule (20 mg total) by mouth 2 (two) times daily before a meal. 06/07/19 06/28/20  Pollina, Gwenyth Allegra, MD      Allergies    Bee venom, Diamox [acetazolamide], Nsaids, Omnipaque [iohexol], Tolmetin, Compazine [prochlorperazine], Iopamidol, Lactose intolerance (gi), and Reglan [metoclopramide]    Review of Systems   Review of Systems  Constitutional:  Negative for chills and fever.  Genitourinary:  Negative for difficulty urinating.  Musculoskeletal:  Positive for back pain and gait problem.    Physical Exam Updated Vital Signs BP 139/83 (BP Location: Right Arm)   Pulse (!) 105   Temp 97.9 F (36.6 C)   Resp 18   LMP 06/14/2003   SpO2 100%  Physical Exam Vitals and nursing note reviewed.  Constitutional:      Appearance: Normal appearance.  HENT:     Head: Normocephalic and atraumatic.     Nose: Nose normal.  Eyes:     Pupils: Pupils are equal, round, and reactive to light.  Cardiovascular:     Rate and Rhythm: Normal rate.  Pulmonary:     Effort: Pulmonary effort is normal.  Abdominal:     General: Abdomen is flat.  Musculoskeletal:     Cervical back: Normal range of motion and neck supple.     Lumbar back: Spasms and tenderness present. Positive right straight leg raise test.     Comments: RLE- KF,KE difficult to evaluate due to pain LLE- HF, HE 5/5 strength Antalgic gait. No pronator drift. No leg drop.   CN I, II and VIII not tested. CN II-XII grossly intact bilaterally.      Skin:    General: Skin is warm and dry.  Neurological:     Mental Status: She is alert and oriented to person, place, and time.     ED Results / Procedures / Treatments   Labs (all labs ordered are listed, but only abnormal results are displayed) Labs Reviewed - No data to display  EKG None  Radiology DG Lumbar Spine Complete  Result Date: 02/02/2023 CLINICAL DATA:  Back pain. EXAM: LUMBAR SPINE - COMPLETE 4+ VIEW COMPARISON:  12/17/2022 FINDINGS: Mild  scoliosis. Normal alignment of the lumbar vertebral bodies on the lateral film. Disc spaces and vertebral bodies are maintained. The facets are normally aligned. No pars defects. The visualized bony pelvis is intact. IMPRESSION: Mild scoliosis but no acute bony findings or significant degenerative changes. Electronically Signed   By: Marijo Sanes M.D.   On: 02/02/2023 18:13    Procedures Procedures    Medications Ordered in ED Medications  lidocaine (LIDODERM) 5 % 1 patch (1 patch Transdermal Patch Applied 02/02/23 1800)  acetaminophen (TYLENOL) tablet 1,000 mg (1,000 mg Oral Given 02/02/23 1801)  oxyCODONE (Oxy IR/ROXICODONE) immediate release tablet 5 mg (5 mg Oral Given 02/02/23 1801)  diazepam (VALIUM) tablet 5 mg (5 mg Oral Given 02/02/23 1801)  ED Course/ Medical Decision Making/ A&P                             Medical Decision Making Amount and/or Complexity of Data Reviewed Radiology: ordered.  Risk OTC drugs. Prescription drug management.    Patient presents to the ED with a chief complaint of right-sided lumbar spine pain which began a couple of days ago.  Similar symptoms in the past when she "threw her back "when lifting of her mother from the floor.  Reports a sharp tingling sensation from the right lumbar spine down her leg.  All pain is exacerbated with weightbearing, ambulation.  She denies any red flags such as prior history of CVA, urinary retention, bowel complaints, fever, prior history of IV drug use.  Talked again is noted on my exam, limited range of motion of the right lower leg due to exacerbated pain.  Pain along the midline of the lumbar spine shooting down the right buttocks down her leg.  Xray of the lumbar spine showed: Mild scoliosis but no acute bony findings or significant  degenerative changes.   Patient was given a copy of her x-ray, discussed appropriate follow-up with spine specialist.  Patient reports overall some improvement after pain  control.  No red flags on her exam, I do feel that patient likely suffering from sciatica at this point with pain radiating down her right leg.  Hemodynamically stable, return precautions discussed at length.  Patient stable for discharge.    Portions of this note were generated with [employer]. Dictation errors may occur despite best attempts at proofreading.   Final Clinical Impression(s) / ED Diagnoses Final diagnoses:  Acute right-sided low back pain with right-sided sciatica    Rx / DC Orders ED Discharge Orders          Ordered    predniSONE (DELTASONE) 20 MG tablet  Daily        02/02/23 1823    cyclobenzaprine (FLEXERIL) 10 MG tablet  2 times daily PRN        02/02/23 1825              Janeece Fitting, PA-C 02/02/23 1826    Audley Hose, MD 02/03/23 [phone]

## 2023-02-02 NOTE — ED Triage Notes (Signed)
Pt reports right sided back pain that radiates to her right knee.

## 2023-02-23 ENCOUNTER — Other Ambulatory Visit: Payer: Self-pay | Admitting: Internal Medicine

## 2023-02-24 ENCOUNTER — Encounter (INDEPENDENT_AMBULATORY_CARE_PROVIDER_SITE_OTHER): Payer: Self-pay

## 2023-02-26 LAB — CBC
HCT: 39.7 % (ref 35.0–45.0)
Hemoglobin: 12.6 g/dL (ref 11.7–15.5)
MCH: 26.1 pg — ABNORMAL LOW (ref 27.0–33.0)
MCHC: 31.7 g/dL — ABNORMAL LOW (ref 32.0–36.0)
MCV: 82.4 fL (ref 80.0–100.0)
MPV: 12.2 fL (ref 7.5–12.5)
Platelets: 396 10*3/uL (ref 140–400)
RBC: 4.82 10*6/uL (ref 3.80–5.10)
RDW: 12.6 % (ref 11.0–15.0)
WBC: 5.2 10*3/uL (ref 3.8–10.8)

## 2023-02-26 LAB — TSH: TSH: 3.69 mIU/L

## 2023-02-26 LAB — COMPLETE METABOLIC PANEL WITH GFR
AG Ratio: 1.5 (calc) (ref 1.0–2.5)
ALT: 15 U/L (ref 6–29)
AST: 18 U/L (ref 10–35)
Albumin: 4.7 g/dL (ref 3.6–5.1)
Alkaline phosphatase (APISO): 114 U/L (ref 37–153)
BUN: 14 mg/dL (ref 7–25)
CO2: 22 mmol/L (ref 20–32)
Calcium: 10 mg/dL (ref 8.6–10.4)
Chloride: 101 mmol/L (ref 98–110)
Creat: 0.66 mg/dL (ref 0.50–1.03)
Globulin: 3.2 g/dL (calc) (ref 1.9–3.7)
Glucose, Bld: 190 mg/dL — ABNORMAL HIGH (ref 65–99)
Potassium: 4.5 mmol/L (ref 3.5–5.3)
Sodium: 137 mmol/L (ref 135–146)
Total Bilirubin: 0.4 mg/dL (ref 0.2–1.2)
Total Protein: 7.9 g/dL (ref 6.1–8.1)
eGFR: 105 mL/min/{1.73_m2} (ref >=60)

## 2023-02-26 LAB — RHEUMATOID FACTOR: Rheumatoid fact SerPl-aCnc: <14 IU/mL (ref <14)

## 2023-02-26 LAB — VITAMIN D 25 HYDROXY (VIT D DEFICIENCY, FRACTURES): Vit D, 25-Hydroxy: 22 ng/mL — ABNORMAL LOW (ref 30–100)

## 2023-02-26 LAB — SEDIMENTATION RATE: Sed Rate: 17 mm/h (ref 0–30)

## 2023-02-26 LAB — URIC ACID: Uric Acid, Serum: 3.7 mg/dL (ref 2.5–7.0)

## 2023-02-26 LAB — ANTI-NUCLEAR AB-TITER (ANA TITER)
ANA TITER: 1:40 {titer} — ABNORMAL HIGH
ANA Titer 1: 1:80 {titer} — ABNORMAL HIGH

## 2023-02-26 LAB — LIPID PANEL
Cholesterol: 189 mg/dL (ref <200)
HDL: 68 mg/dL (ref >=50)
LDL Cholesterol (Calc): 102 mg/dL (calc) — ABNORMAL HIGH
Non-HDL Cholesterol (Calc): 121 mg/dL (calc) (ref <130)
Total CHOL/HDL Ratio: 2.8 (calc) (ref <5.0)
Triglycerides: 99 mg/dL (ref <150)

## 2023-02-26 LAB — ANA: Anti Nuclear Antibody (ANA): POSITIVE — AB

## 2023-06-09 ENCOUNTER — Other Ambulatory Visit: Payer: Self-pay

## 2023-06-09 ENCOUNTER — Emergency Department (HOSPITAL_BASED_OUTPATIENT_CLINIC_OR_DEPARTMENT_OTHER): Payer: 59

## 2023-06-09 ENCOUNTER — Emergency Department (HOSPITAL_BASED_OUTPATIENT_CLINIC_OR_DEPARTMENT_OTHER)
Admission: EM | Admit: 2023-06-09 | Discharge: 2023-06-09 | Disposition: A | Discharge status: Home / Self Care | Payer: 59 | Attending: Emergency Medicine | Admitting: Emergency Medicine

## 2023-06-09 DIAGNOSIS — M542 Cervicalgia: Secondary | ICD-10-CM | POA: Diagnosis not present

## 2023-06-09 DIAGNOSIS — R1031 Right lower quadrant pain: Secondary | ICD-10-CM | POA: Insufficient documentation

## 2023-06-09 DIAGNOSIS — M7989 Other specified soft tissue disorders: Secondary | ICD-10-CM | POA: Diagnosis not present

## 2023-06-09 DIAGNOSIS — Z86718 Personal history of other venous thrombosis and embolism: Secondary | ICD-10-CM | POA: Insufficient documentation

## 2023-06-09 LAB — CBC WITH DIFFERENTIAL/PLATELET
Abs Immature Granulocytes: 0.01 10*3/uL (ref 0.00–0.07)
Basophils Absolute: 0.1 10*3/uL (ref 0.0–0.1)
Basophils Relative: 1 %
Eosinophils Absolute: 0.1 10*3/uL (ref 0.0–0.5)
Eosinophils Relative: 2 %
HCT: 40.2 % (ref 36.0–46.0)
Hemoglobin: 12.8 g/dL (ref 12.0–15.0)
Immature Granulocytes: 0 %
Lymphocytes Relative: 38 %
Lymphs Abs: 2.3 10*3/uL (ref 0.7–4.0)
MCH: 26.8 pg (ref 26.0–34.0)
MCHC: 31.8 g/dL (ref 30.0–36.0)
MCV: 84.1 fL (ref 80.0–100.0)
Monocytes Absolute: 0.5 10*3/uL (ref 0.1–1.0)
Monocytes Relative: 8 %
Neutro Abs: 3.1 10*3/uL (ref 1.7–7.7)
Neutrophils Relative %: 51 %
Platelets: 233 10*3/uL (ref 150–400)
RBC: 4.78 MIL/uL (ref 3.87–5.11)
RDW: 13.2 % (ref 11.5–15.5)
WBC: 6 10*3/uL (ref 4.0–10.5)
nRBC: 0 % (ref 0.0–0.2)

## 2023-06-09 LAB — COMPREHENSIVE METABOLIC PANEL
ALT: 12 U/L (ref 0–44)
AST: 18 U/L (ref 15–41)
Albumin: 4.8 g/dL (ref 3.5–5.0)
Alkaline Phosphatase: 108 U/L (ref 38–126)
Anion gap: 10 (ref 5–15)
BUN: 14 mg/dL (ref 6–20)
CO2: 27 mmol/L (ref 22–32)
Calcium: 10.6 mg/dL — ABNORMAL HIGH (ref 8.9–10.3)
Chloride: 101 mmol/L (ref 98–111)
Creatinine, Ser: 0.94 mg/dL (ref 0.44–1.00)
GFR, Estimated: >60 mL/min (ref >60)
Glucose, Bld: 147 mg/dL — ABNORMAL HIGH (ref 70–99)
Potassium: 4.7 mmol/L (ref 3.5–5.1)
Sodium: 138 mmol/L (ref 135–145)
Total Bilirubin: 0.4 mg/dL (ref 0.3–1.2)
Total Protein: 8.2 g/dL — ABNORMAL HIGH (ref 6.5–8.1)

## 2023-06-09 LAB — URINALYSIS, ROUTINE W REFLEX MICROSCOPIC
Bilirubin Urine: NEGATIVE
Glucose, UA: 500 mg/dL — AB
Hgb urine dipstick: NEGATIVE
Ketones, ur: NEGATIVE mg/dL
Nitrite: NEGATIVE
Specific Gravity, Urine: 1.023 (ref 1.005–1.030)
pH: 6.5 (ref 5.0–8.0)

## 2023-06-09 LAB — LIPASE, BLOOD: Lipase: 25 U/L (ref 11–51)

## 2023-06-09 MED ORDER — ONDANSETRON HCL 4 MG/2ML IJ SOLN
4.0000 mg | Freq: Once | INTRAMUSCULAR | Status: AC
  Administered 2023-06-09: 4 mg via INTRAVENOUS

## 2023-06-09 MED ORDER — METHYLPREDNISOLONE SODIUM SUCC 125 MG IJ SOLR
125.0000 mg | INTRAMUSCULAR | Status: AC
  Administered 2023-06-09: 125 mg via INTRAVENOUS
  Filled 2023-06-09: qty 2

## 2023-06-09 MED ORDER — HYDROMORPHONE HCL 1 MG/ML IJ SOLN
1.0000 mg | Freq: Once | INTRAMUSCULAR | Status: AC
  Administered 2023-06-09: 1 mg via INTRAVENOUS
  Filled 2023-06-09: qty 1

## 2023-06-09 MED ORDER — SENNOSIDES-DOCUSATE SODIUM 8.6-50 MG PO TABS: 1.0000 | ORAL_TABLET | Freq: Every evening | ORAL | 0 refills | Status: DC | PRN

## 2023-06-09 MED ORDER — GABAPENTIN 300 MG PO CAPS: 300.0000 mg | ORAL_CAPSULE | Freq: Three times a day (TID) | ORAL | 0 refills | Status: DC | PRN

## 2023-06-09 MED ORDER — ONDANSETRON HCL 4 MG/2ML IJ SOLN
INTRAMUSCULAR | Status: AC
  Filled 2023-06-09: qty 2

## 2023-06-09 MED ORDER — LIDOCAINE 5 % EX PTCH
1.0000 | MEDICATED_PATCH | CUTANEOUS | Status: DC
  Administered 2023-06-09: 1 via TRANSDERMAL
  Filled 2023-06-09: qty 1

## 2023-06-09 MED ORDER — OXYCODONE-ACETAMINOPHEN 5-325 MG PO TABS: 1.0000 | ORAL_TABLET | Freq: Four times a day (QID) | ORAL | 0 refills | Status: DC | PRN

## 2023-06-09 MED ORDER — CYCLOBENZAPRINE HCL 5 MG PO TABS
5.0000 mg | ORAL_TABLET | Freq: Once | ORAL | Status: AC
  Administered 2023-06-09: 5 mg via ORAL
  Filled 2023-06-09: qty 1

## 2023-06-09 NOTE — ED Triage Notes (Signed)
Pt arrives pov,slow gait with c/o posterior neck pain radiating to back x 2 weeks. Also reports LT foot swelling. Denies fever or injury

## 2023-06-09 NOTE — ED Provider Notes (Signed)
[place] EMERGENCY DEPARTMENT AT [facility] Provider Note   CSN: [id number] Arrival date & time: 06/09/23  1636     History  Chief Complaint  Patient presents with   Neck Pain    Donna [name] is a 52 y.o. female with medical history of DVT, seizures, migraines, endometriosis, lupus, sciatica.  Patient presents to ED for evaluation of multiple complaints.  The patient states that over the last 1 month she has had increasing cervical spinal tenderness radiating into her thoracic spinal area.  She states that at 1 point her doctor had placed her on a pain medication regimen to include oxycodone and gabapentin.  She states that she took this for about 1 month and it did greatly reduce her pain however she did not like being on narcotic pain medication so she discontinued this and only has been taking Tylenol and gabapentin for the last 1 month.  She reports that her pain is increased markedly while she has been off of her narcotic pain medication.  She states all of her symptoms began back when she attempted to lift her mother off of the ground when she had fallen.  She states that since this time she has had chronic neck and back pain as well as sciatica.  The patient goes on to state that she also feels as if her left foot and left lower leg are swollen.  She denies chest pain or shortness of breath does have a history of DVTs and she is not anticoagulated.  On assessment, the patient does have rebound tenderness to her right lower quadrant.  She states that she usually only has 1 bowel movement a month and she has only had 1 bowel movement a month since she was 52 years old.  She denies history of SBO.  She denies fevers, nausea, vomiting, diarrhea.  She denies dysuria or flank pain.  She reports that the pain in her neck and back is so significant that she typically has to miss work secondary to pain.  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   Neck  Pain Associated symptoms: no chest pain and no fever        Home Medications Prior to Admission medications   Medication Sig Start Date End Date Taking? Authorizing Provider  gabapentin (NEURONTIN) 300 MG capsule Take 1 capsule (300 mg total) by mouth 3 (three) times daily as needed. 06/09/23  Yes Long, Arlyss Repress, MD  oxyCODONE-acetaminophen (PERCOCET/ROXICET) 5-325 MG tablet Take 1 tablet by mouth every 6 (six) hours as needed for severe pain. 06/09/23  Yes Long, Arlyss Repress, MD  senna-docusate (SENOKOT-S) 8.6-50 MG tablet Take 1 tablet by mouth at bedtime as needed for mild constipation. 06/09/23  Yes Long, Arlyss Repress, MD  acetaminophen (TYLENOL) 500 MG tablet Take 500 mg by mouth every 6 (six) hours as needed for moderate pain.    [provider]  blood glucose meter kit and supplies KIT Dispense based on patient and insurance preference. Use up to four times daily as directed. (FOR ICD-9 250.00, 250.01). 03/11/18   Marguerita Merles Latif, DO  cholecalciferol (VITAMIN D) 1000 units tablet Take 1,000 Units by mouth daily.    [provider]  Dulaglutide (TRULICITY) 0.75 MG/0.5ML SOPN Inject 0.75 mg into the skin once a week. 01/18/23   Hoy Register, MD  EPINEPHrine (EPIPEN 2-PAK) 0.3 mg/0.3 mL IJ SOAJ injection Inject 0.3 mLs (0.3 mg total) into the muscle once as needed (for  severe allergic reaction). Patient taking differently: Inject 0.3 mg into the muscle once as needed for anaphylaxis. 06/28/20   Molpus, John, MD  famotidine (PEPCID) 20 MG tablet Take 1 tablet (20 mg total) by mouth 2 (two) times daily. 06/05/22   Gerhard Munch, MD  famotidine (PEPCID) 40 MG tablet 1 hour prior to CT 01/13/22   Vanna Scotland, MD  gabapentin (NEURONTIN) 300 MG capsule Take 1-2 capsules (300-600 mg total) by mouth 3 (three) times daily. Patient not taking: Reported on 02/03/2015 02/20/14 06/08/24  Constant, Peggy, MD  HYDROcodone-acetaminophen (NORCO/VICODIN) 5-325 MG tablet Take 1 tablet by mouth  every 6 (six) hours as needed for severe pain. 12/17/22   Zadie Rhine, MD  lisinopril (ZESTRIL) 10 MG tablet Take 1 tablet (10 mg total) by mouth daily. 01/05/22   Grayce Sessions, NP  methocarbamol (ROBAXIN) 500 MG tablet Take 1 tablet (500 mg total) by mouth 2 (two) times daily. 12/17/22   Zadie Rhine, MD  ondansetron (ZOFRAN-ODT) 4 MG disintegrating tablet Take 1 tablet (4 mg total) by mouth every 8 (eight) hours as needed for nausea or vomiting. 05/03/22   Pricilla Loveless, MD  OVER THE COUNTER MEDICATION Take 2 tablets by mouth daily. Amberen    [provider]  sucralfate (CARAFATE) 1 g tablet Take 1 tablet (1 g total) by mouth 4 (four) times daily -  with meals and at bedtime. 06/05/22   Gerhard Munch, MD  vitamin B-12 (CYANOCOBALAMIN) 1000 MCG tablet Take 1,000 mcg by mouth daily.    [provider]  dicyclomine (BENTYL) 20 MG tablet Take 1 tablet (20 mg total) by mouth 2 (two) times daily as needed for spasms. 02/20/19 06/28/20  Fawze, Mina A, PA-C  ipratropium-albuterol (DUONEB) 0.5-2.5 (3) MG/3ML SOLN Take 3 mLs by nebulization every 4 (four) hours as needed. Patient not taking: Reported on 09/22/2018 03/11/18 06/07/19  Marguerita Merles Latif, DO  omeprazole (PRILOSEC) 20 MG capsule Take 1 capsule (20 mg total) by mouth 2 (two) times daily before a meal. 06/07/19 06/28/20  Pollina, Canary Brim, MD      Allergies    Bee venom, Diamox [acetazolamide], Nsaids, Omnipaque [iohexol], Tolmetin, Compazine [prochlorperazine], Iopamidol, Lactose intolerance (gi), and Reglan [metoclopramide]    Review of Systems   Review of Systems  Constitutional:  Negative for fever.  Respiratory:  Negative for shortness of breath.   Cardiovascular:  Positive for leg swelling. Negative for chest pain.  Gastrointestinal:  Positive for abdominal pain. Negative for nausea and vomiting.  Genitourinary:  Negative for dysuria.  Musculoskeletal:  Positive for back pain and neck pain.  All other  systems reviewed and are negative.   Physical Exam Updated Vital Signs BP (!) 164/87 (BP Location: Right Arm)   Pulse 72   Temp 98.7 F (37.1 C) (Oral)   Resp 16   Ht 5' (1.524 m)   Wt 71.2 kg   LMP 06/14/2003   SpO2 100%   BMI 30.66 kg/m  Physical Exam Vitals and nursing note reviewed.  Constitutional:      General: She is in acute distress.     Appearance: She is not ill-appearing, toxic-appearing or diaphoretic.     Comments: Patient appears very uncomfortable sitting upright on side of bed  HENT:     Head: Normocephalic and atraumatic.     Mouth/Throat:     Mouth: Mucous membranes are moist.     Pharynx: Oropharynx is clear.  Eyes:     Extraocular Movements: Extraocular movements intact.  Conjunctiva/sclera: Conjunctivae normal.     Pupils: Pupils are equal, round, and reactive to light.  Cardiovascular:     Rate and Rhythm: Regular rhythm. Tachycardia present.  Pulmonary:     Effort: Pulmonary effort is normal.     Breath sounds: Normal breath sounds. No wheezing.  Abdominal:     General: Abdomen is flat.     Tenderness: There is abdominal tenderness. There is rebound. There is no right CVA tenderness or left CVA tenderness.     Comments: RLQ tenderness with rebound tenderness  Musculoskeletal:     Cervical back: Tenderness present.     Thoracic back: Tenderness present.       Back:  Skin:    General: Skin is warm and dry.     Capillary Refill: Capillary refill takes less than 2 seconds.  Neurological:     Mental Status: She is alert and oriented to person, place, and time.     GCS: GCS eye subscore is 4. GCS verbal subscore is 5. GCS motor subscore is 6.     Cranial Nerves: Cranial nerves 2-12 are intact. No cranial nerve deficit.     Sensory: Sensation is intact. No sensory deficit.     Motor: Motor function is intact. No weakness.     ED Results / Procedures / Treatments   Labs (all labs ordered are listed, but only abnormal results are  displayed) Labs Reviewed  COMPREHENSIVE METABOLIC PANEL - Abnormal; Notable for the following components:      Result Value   Glucose, Bld 147 (*)    Calcium 10.6 (*)    Total Protein 8.2 (*)    All other components within normal limits  URINALYSIS, ROUTINE W REFLEX MICROSCOPIC - Abnormal; Notable for the following components:   Glucose, UA 500 (*)    Protein, ur TRACE (*)    Leukocytes,Ua SMALL (*)    Bacteria, UA MANY (*)    All other components within normal limits  CBC WITH DIFFERENTIAL/PLATELET  LIPASE, BLOOD    EKG None  Radiology US Venous Img Lower  Left (DVT Study)  Result Date: 06/09/2023 CLINICAL DATA:  Left leg swelling and pain EXAM: Left LOWER EXTREMITY VENOUS DOPPLER ULTRASOUND TECHNIQUE: Gray-scale sonography with compression, as well as color and duplex ultrasound, were performed to evaluate the deep venous system(s) from the level of the common femoral vein through the popliteal and proximal calf veins. COMPARISON:  None Available. FINDINGS: VENOUS Normal compressibility of the common femoral, superficial femoral, and popliteal veins, as well as the visualized calf veins. Visualized portions of profunda femoral vein and great saphenous vein unremarkable. No filling defects to suggest DVT on grayscale or color Doppler imaging. Doppler waveforms show normal direction of venous flow, normal respiratory plasticity and response to augmentation. Limited views of the contralateral common femoral vein are unremarkable. OTHER None. Limitations: none IMPRESSION: No evidence of left lower extremity DVT. Electronically Signed   By: Karen Kays M.D.   On: 06/09/2023 18:51   CT Cervical Spine Wo Contrast  Result Date: 06/09/2023 CLINICAL DATA:  Neck and back pain. EXAM: CT CERVICAL SPINE WITHOUT CONTRAST CT THORACIC SPINE WITHOUT CONTRAST TECHNIQUE: Multidetector CT imaging of the cervical and thoracic spine was performed without contrast. Multiplanar CT image reconstructions were  also generated. RADIATION DOSE REDUCTION: This exam was performed according to the departmental dose-optimization program which includes automated exposure control, adjustment of the mA and/or kV according to patient size and/or use of iterative reconstruction technique. COMPARISON:  CT  of the cervical spine dated 12/17/2022. FINDINGS: CT CERVICAL SPINE FINDINGS Alignment: No acute subluxation. Skull base and vertebrae: No acute fracture. Soft tissues and spinal canal: No prevertebral fluid or swelling. No visible canal hematoma. Disc levels: No acute findings. No significant degenerative changes. Upper chest: Negative. Other: A 6 mm left thyroid hypodense nodule. Not clinically significant; no follow-up imaging recommended (ref: J Am Coll Radiol. 2015 Feb;12(2): 143-50). CT THORACIC SPINE FINDINGS Alignment: No acute subluxation. Vertebrae: No acute fracture. Paraspinal and other soft tissues: Negative. Disc levels: No acute findings. No significant degenerative changes. IMPRESSION: No acute/traumatic cervical or thoracic spine pathology. Electronically Signed   By: Elgie Collard M.D.   On: 06/09/2023 18:28   CT Thoracic Spine Wo Contrast  Result Date: 06/09/2023 CLINICAL DATA:  Neck and back pain. EXAM: CT CERVICAL SPINE WITHOUT CONTRAST CT THORACIC SPINE WITHOUT CONTRAST TECHNIQUE: Multidetector CT imaging of the cervical and thoracic spine was performed without contrast. Multiplanar CT image reconstructions were also generated. RADIATION DOSE REDUCTION: This exam was performed according to the departmental dose-optimization program which includes automated exposure control, adjustment of the mA and/or kV according to patient size and/or use of iterative reconstruction technique. COMPARISON:  CT of the cervical spine dated 12/17/2022. FINDINGS: CT CERVICAL SPINE FINDINGS Alignment: No acute subluxation. Skull base and vertebrae: No acute fracture. Soft tissues and spinal canal: No prevertebral fluid or  swelling. No visible canal hematoma. Disc levels: No acute findings. No significant degenerative changes. Upper chest: Negative. Other: A 6 mm left thyroid hypodense nodule. Not clinically significant; no follow-up imaging recommended (ref: J Am Coll Radiol. 2015 Feb;12(2): 143-50). CT THORACIC SPINE FINDINGS Alignment: No acute subluxation. Vertebrae: No acute fracture. Paraspinal and other soft tissues: Negative. Disc levels: No acute findings. No significant degenerative changes. IMPRESSION: No acute/traumatic cervical or thoracic spine pathology. Electronically Signed   By: Elgie Collard M.D.   On: 06/09/2023 18:28   CT ABDOMEN PELVIS WO CONTRAST  Result Date: 06/09/2023 CLINICAL DATA:  Pain right lower quadrant of abdomen EXAM: CT ABDOMEN AND PELVIS WITHOUT CONTRAST TECHNIQUE: Multidetector CT imaging of the abdomen and pelvis was performed following the standard protocol without IV contrast. RADIATION DOSE REDUCTION: This exam was performed according to the departmental dose-optimization program which includes automated exposure control, adjustment of the mA and/or kV according to patient size and/or use of iterative reconstruction technique. COMPARISON:  07/16/2022 FINDINGS: Lower chest: Visualized lower lung fields are clear. Hepatobiliary: No focal abnormalities are seen in level. There is no dilation of bile ducts. Gallbladder is not distended. Pancreas: No focal abnormalities are seen. Spleen: Unremarkable. Adrenals/Urinary Tract: Adrenals are unremarkable. There is no hydronephrosis. There is 5.9 cm smooth marginated fluid density structure in the left kidney suggesting large renal cysts with no significant interval change. There is 1 mm calculus in the lower pole of left kidney. There is a possible 7 mm calculus in the upper pole of right kidney. Ureters are not dilated. Urinary bladder is unremarkable. Stomach/Bowel: Small hiatal hernia is seen. There is fluid in the lumen of lower thoracic  esophagus suggesting gastroesophageal reflux. Stomach is not distended. Small bowel loops are not dilated. The appendix is not dilated. There is no significant wall thickening in colon. There is no pericolic stranding. Vascular/Lymphatic: Unremarkable Reproductive: Uterus is not seen. Other: There is no ascites or pneumoperitoneum. Musculoskeletal: No acute findings seen. Small sclerotic densities seen in proximal right femur may suggest benign bone islands with no significant interval change. IMPRESSION: There is no  evidence of intestinal obstruction or pneumoperitoneum. Appendix is not dilated. There is no hydronephrosis. Large left renal cyst with no interval change. There are tiny nonobstructing renal stones. Small hiatal hernia with gastroesophageal reflux. Electronically Signed   By: Ernie Avena M.D.   On: 06/09/2023 18:27    Procedures Procedures   Medications Ordered in ED Medications  lidocaine (LIDODERM) 5 % 1 patch (1 patch Transdermal Patch Applied 06/09/23 1755)  HYDROmorphone (DILAUDID) injection 1 mg (1 mg Intravenous Given 06/09/23 1755)  cyclobenzaprine (FLEXERIL) tablet 5 mg (5 mg Oral Given 06/09/23 1824)  methylPREDNISolone sodium succinate (SOLU-MEDROL) 125 mg/2 mL injection 125 mg (125 mg Intravenous Given 06/09/23 1755)  ondansetron (ZOFRAN) injection 4 mg (4 mg Intravenous Given 06/09/23 1755)    ED Course/ Medical Decision Making/ A&P  Medical Decision Making Amount and/or Complexity of Data Reviewed Labs: ordered. Radiology: ordered.  Risk OTC drugs. Prescription drug management.   52 YO F presents to ED for evaluation. Please see HPI for further details.  On exam patient afebrile, nontachycardic. Lung sounds CTAB, not hypoxic on room air. Abdomen has tenderness in RLQ with rebound, no guarding. No CVA tenderness bilaterally. No focal deficits on exam. Patient does have midline cervical spinal tenderness and left sided thoracic spinal tenderness.  CBC  without leukocytosis or anemia. CBC with elevated glucose, no electrolyte derangement. No elevated LFT, creatinine WNL, anion gap 10. Urinalysis shows small leuks, bacteria, glucose. Lipase WNL.   CT cervical and thoracic spine negative for acute process. CT abdomen pelvis unremarkable with no inflammation of appendix. Patient ultrasound imaging pending.  Patient given 1mg  dilaudid for pain control, 5mg  flexeril, lidoderm patch. Patient given 125 solumedrol in the event she requires CT scan with contrast as she has contrast allergy. Patient given 4mg  zofran after initial examination as she had sudden onset nausea.   Patient signed out to Dr. Jacqulyn Bath pending ultrasound imaging results.    Final Clinical Impression(s) / ED Diagnoses Final diagnoses:  Neck pain  Right lower quadrant abdominal pain  Left leg swelling    Rx / DC Orders ED Discharge Orders          Ordered    oxyCODONE-acetaminophen (PERCOCET/ROXICET) 5-325 MG tablet  Every 6 hours PRN        06/09/23 1918    senna-docusate (SENOKOT-S) 8.6-50 MG tablet  At bedtime PRN        06/09/23 1918    gabapentin (NEURONTIN) 300 MG capsule  3 times daily PRN        06/09/23 1918              Clent Ridges 06/09/23 1930    Maia Plan, MD 06/15/23 [phone]

## 2023-06-09 NOTE — Discharge Instructions (Signed)

## 2023-08-10 ENCOUNTER — Emergency Department (HOSPITAL_BASED_OUTPATIENT_CLINIC_OR_DEPARTMENT_OTHER)
Admission: EM | Admit: 2023-08-10 | Discharge: 2023-08-10 | Disposition: A | Discharge status: Home / Self Care | Payer: 59 | Attending: Emergency Medicine | Admitting: Emergency Medicine

## 2023-08-10 ENCOUNTER — Encounter (HOSPITAL_BASED_OUTPATIENT_CLINIC_OR_DEPARTMENT_OTHER): Payer: Self-pay

## 2023-08-10 ENCOUNTER — Emergency Department (HOSPITAL_BASED_OUTPATIENT_CLINIC_OR_DEPARTMENT_OTHER): Payer: 59 | Admitting: Radiology

## 2023-08-10 ENCOUNTER — Other Ambulatory Visit: Payer: Self-pay

## 2023-08-10 DIAGNOSIS — M25552 Pain in left hip: Secondary | ICD-10-CM | POA: Insufficient documentation

## 2023-08-10 DIAGNOSIS — M25512 Pain in left shoulder: Secondary | ICD-10-CM | POA: Diagnosis present

## 2023-08-10 DIAGNOSIS — M25522 Pain in left elbow: Secondary | ICD-10-CM | POA: Insufficient documentation

## 2023-08-10 DIAGNOSIS — S43402A Unspecified sprain of left shoulder joint, initial encounter: Secondary | ICD-10-CM

## 2023-08-10 DIAGNOSIS — W010XXA Fall on same level from slipping, tripping and stumbling without subsequent striking against object, initial encounter: Secondary | ICD-10-CM | POA: Insufficient documentation

## 2023-08-10 DIAGNOSIS — S53402A Unspecified sprain of left elbow, initial encounter: Secondary | ICD-10-CM

## 2023-08-10 DIAGNOSIS — Y9222 Religious institution as the place of occurrence of the external cause: Secondary | ICD-10-CM | POA: Diagnosis not present

## 2023-08-10 DIAGNOSIS — M25532 Pain in left wrist: Secondary | ICD-10-CM | POA: Insufficient documentation

## 2023-08-10 DIAGNOSIS — W19XXXA Unspecified fall, initial encounter: Secondary | ICD-10-CM

## 2023-08-10 DIAGNOSIS — S7002XA Contusion of left hip, initial encounter: Secondary | ICD-10-CM

## 2023-08-10 DIAGNOSIS — S63502A Unspecified sprain of left wrist, initial encounter: Secondary | ICD-10-CM

## 2023-08-10 MED ORDER — ONDANSETRON 4 MG PO TBDP
8.0000 mg | ORAL_TABLET | Freq: Once | ORAL | Status: AC
  Administered 2023-08-10: 8 mg via ORAL
  Filled 2023-08-10: qty 2

## 2023-08-10 MED ORDER — HYDROCODONE-ACETAMINOPHEN 5-325 MG PO TABS
2.0000 | ORAL_TABLET | Freq: Once | ORAL | Status: AC
  Administered 2023-08-10: 2 via ORAL
  Filled 2023-08-10: qty 2

## 2023-08-10 MED ORDER — TRAMADOL HCL 50 MG PO TABS
50.0000 mg | ORAL_TABLET | Freq: Four times a day (QID) | ORAL | 0 refills | Status: DC | PRN
Start: 2023-08-10 — End: 2023-11-18

## 2023-08-10 NOTE — ED Provider Notes (Signed)
[place] EMERGENCY DEPARTMENT AT [facility] Provider Note   CSN: [id number] Arrival date & time: 08/10/23  0035     History  Chief Complaint  Patient presents with   Donna [name] is a 52 y.o. female.  Patient is a 52 year old female with history of GERD, lupus, seizures.  Patient presenting for evaluation of fall.  Yesterday evening she was at church and tripped over an uneven step causing her to fall forward and landed on her left shoulder, elbow, and hip.  She is complaining of pain in these areas.  She denies having struck her head or any loss of consciousness.  Pain worse with movement with no alleviating factors.  The history is provided by the patient.       Home Medications Prior to Admission medications   Medication Sig Start Date End Date Taking? Authorizing Provider  acetaminophen (TYLENOL) 500 MG tablet Take 500 mg by mouth every 6 (six) hours as needed for moderate pain.    [provider]  blood glucose meter kit and supplies KIT Dispense based on patient and insurance preference. Use up to four times daily as directed. (FOR ICD-9 250.00, 250.01). 03/11/18   Marguerita Merles Latif, DO  cholecalciferol (VITAMIN D) 1000 units tablet Take 1,000 Units by mouth daily.    [provider]  Dulaglutide (TRULICITY) 0.75 MG/0.5ML SOPN Inject 0.75 mg into the skin once a week. 01/18/23   Hoy Register, MD  EPINEPHrine (EPIPEN 2-PAK) 0.3 mg/0.3 mL IJ SOAJ injection Inject 0.3 mLs (0.3 mg total) into the muscle once as needed (for severe allergic reaction). Patient taking differently: Inject 0.3 mg into the muscle once as needed for anaphylaxis. 06/28/20   Molpus, John, MD  famotidine (PEPCID) 20 MG tablet Take 1 tablet (20 mg total) by mouth 2 (two) times daily. 06/05/22   Gerhard Munch, MD  famotidine (PEPCID) 40 MG tablet 1 hour prior to CT 01/13/22   Vanna Scotland, MD  gabapentin (NEURONTIN) 300 MG capsule Take 1-2 capsules (300-600 mg  total) by mouth 3 (three) times daily. Patient not taking: Reported on 02/03/2015 02/20/14 06/08/24  Constant, Peggy, MD  gabapentin (NEURONTIN) 300 MG capsule Take 1 capsule (300 mg total) by mouth 3 (three) times daily as needed. 06/09/23   Long, Arlyss Repress, MD  HYDROcodone-acetaminophen (NORCO/VICODIN) 5-325 MG tablet Take 1 tablet by mouth every 6 (six) hours as needed for severe pain. 12/17/22   Zadie Rhine, MD  lisinopril (ZESTRIL) 10 MG tablet Take 1 tablet (10 mg total) by mouth daily. 01/05/22   Grayce Sessions, NP  methocarbamol (ROBAXIN) 500 MG tablet Take 1 tablet (500 mg total) by mouth 2 (two) times daily. 12/17/22   Zadie Rhine, MD  ondansetron (ZOFRAN-ODT) 4 MG disintegrating tablet Take 1 tablet (4 mg total) by mouth every 8 (eight) hours as needed for nausea or vomiting. 05/03/22   Pricilla Loveless, MD  OVER THE COUNTER MEDICATION Take 2 tablets by mouth daily. Amberen    [provider]  oxyCODONE-acetaminophen (PERCOCET/ROXICET) 5-325 MG tablet Take 1 tablet by mouth every 6 (six) hours as needed for severe pain. 06/09/23   Long, Arlyss Repress, MD  senna-docusate (SENOKOT-S) 8.6-50 MG tablet Take 1 tablet by mouth at bedtime as needed for mild constipation. 06/09/23   Long, Arlyss Repress, MD  sucralfate (CARAFATE) 1 g tablet Take 1 tablet (1 g total) by mouth 4 (four) times daily -  with meals and at bedtime. 06/05/22   Gerhard Munch,  MD  vitamin B-12 (CYANOCOBALAMIN) 1000 MCG tablet Take 1,000 mcg by mouth daily.    [provider]  dicyclomine (BENTYL) 20 MG tablet Take 1 tablet (20 mg total) by mouth 2 (two) times daily as needed for spasms. 02/20/19 06/28/20  Fawze, Mina A, PA-C  ipratropium-albuterol (DUONEB) 0.5-2.5 (3) MG/3ML SOLN Take 3 mLs by nebulization every 4 (four) hours as needed. Patient not taking: Reported on 09/22/2018 03/11/18 06/07/19  Marguerita Merles Latif, DO  omeprazole (PRILOSEC) 20 MG capsule Take 1 capsule (20 mg total) by mouth 2 (two) times daily  before a meal. 06/07/19 06/28/20  Pollina, Canary Brim, MD      Allergies    Bee venom, Diamox [acetazolamide], Nsaids, Omnipaque [iohexol], Tolmetin, Compazine [prochlorperazine], Iopamidol, Lactose intolerance (gi), and Reglan [metoclopramide]    Review of Systems   Review of Systems  All other systems reviewed and are negative.   Physical Exam Updated Vital Signs BP (!) 155/99   Pulse 88   Temp 97.6 F (36.4 C)   Resp 17   Ht 5' (1.524 m)   Wt 71.2 kg   LMP 06/14/2003   SpO2 99%   BMI 30.66 kg/m  Physical Exam Vitals and nursing note reviewed.  Constitutional:      General: She is not in acute distress.    Appearance: She is well-developed. She is not diaphoretic.  HENT:     Head: Normocephalic and atraumatic.  Cardiovascular:     Rate and Rhythm: Normal rate and regular rhythm.     Heart sounds: No murmur heard.    No friction rub. No gallop.  Pulmonary:     Effort: Pulmonary effort is normal. No respiratory distress.     Breath sounds: Normal breath sounds. No wheezing.  Abdominal:     General: Bowel sounds are normal. There is no distension.     Palpations: Abdomen is soft.     Tenderness: There is no abdominal tenderness.  Musculoskeletal:        General: Normal range of motion.     Cervical back: Normal range of motion and neck supple.     Comments: The left shoulder appears grossly normal.  There is no deformity or obvious dislocation.  There is tenderness to palpation over the lateral aspect of the deltoid.  She has pain with any range of motion.  The left elbow is grossly normal in appearance.  There is tenderness throughout, but no obvious deformity.  Range of motion limited secondary to pain.  There is tenderness to palpation over the dorsal aspect of the wrist.  There is slight swelling, but no deformity.  Capillary refill is brisk to all fingers and motor and sensation are intact throughout the entire hand.  There is tenderness over the lateral  aspect of the left hip.  She has pain with range of motion.  Distal PMS is intact.  Skin:    General: Skin is warm and dry.  Neurological:     General: No focal deficit present.     Mental Status: She is alert and oriented to person, place, and time.     ED Results / Procedures / Treatments   Labs (all labs ordered are listed, but only abnormal results are displayed) Labs Reviewed - No data to display  EKG None  Radiology No results found.  Procedures Procedures    Medications Ordered in ED Medications - No data to display  ED Course/ Medical Decision Making/ A&P  Patient here after a fall,  the details of which are described in the HPI.  She is experiencing pain in her left shoulder, left elbow, left wrist, and left hip.  X-rays of these areas are all negative.  This will be treated as contusions/sprains.  She is to follow-up as needed if not improving.  Final Clinical Impression(s) / ED Diagnoses Final diagnoses:  None    Rx / DC Orders ED Discharge Orders     None         Geoffery Lyons, MD 08/10/23 [phone]

## 2023-08-10 NOTE — ED Triage Notes (Signed)
POV from home, A&O x 4, GCS 15, amb to room  Pt fell last night on steps at church, sts four steps, pain to left shoulder, elbow, wrist and left hip. Denies LOC, no blood thinners.

## 2023-08-10 NOTE — Discharge Instructions (Addendum)
Wear arm sling for comfort and support.  Ice for 20 minutes every 2 hours while awake for the next 2 days.  Take ibuprofen 600 mg every 6 hours as needed for pain.  Begin taking tramadol as prescribed as needed for pain not relieved with ibuprofen.  Follow-up with your primary doctor if symptoms are not improving in the next week.

## 2023-08-29 ENCOUNTER — Emergency Department (HOSPITAL_BASED_OUTPATIENT_CLINIC_OR_DEPARTMENT_OTHER): Payer: 59

## 2023-08-29 ENCOUNTER — Other Ambulatory Visit: Payer: Self-pay

## 2023-08-29 ENCOUNTER — Emergency Department (HOSPITAL_BASED_OUTPATIENT_CLINIC_OR_DEPARTMENT_OTHER)
Admission: EM | Admit: 2023-08-29 | Discharge: 2023-08-29 | Disposition: A | Discharge status: Home / Self Care | Payer: 59 | Attending: Emergency Medicine | Admitting: Emergency Medicine

## 2023-08-29 ENCOUNTER — Encounter (HOSPITAL_BASED_OUTPATIENT_CLINIC_OR_DEPARTMENT_OTHER): Payer: Self-pay

## 2023-08-29 DIAGNOSIS — M25532 Pain in left wrist: Secondary | ICD-10-CM | POA: Insufficient documentation

## 2023-08-29 NOTE — ED Triage Notes (Signed)
Pt states she fell a few weeks ago and was seen here.  Swelling has gone down and now she has increased pain and has a bony place at wrist that is painful to touch

## 2023-08-29 NOTE — ED Provider Notes (Signed)
[place] EMERGENCY DEPARTMENT AT [facility] Provider Note   CSN: [id number] Arrival date & time: 08/29/23  2038     History  Chief Complaint  Patient presents with   Wrist Pain    Donna [name] is a 52 y.o. 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  Patient notes that she fell a few weeks ago and was evaluated here and ultimately discharged.  Patient states that over the past week or so she has had a cystlike structure on the thumb side of her wrist that is interfering with her daily life.  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  Does not have history of ganglion cysts.  Patient has tried Tylenol and ibuprofen to no relief.   Home Medications Prior to Admission medications   Medication Sig Start Date End Date Taking? Authorizing Provider  acetaminophen (TYLENOL) 500 MG tablet Take 500 mg by mouth every 6 (six) hours as needed for moderate pain.    [provider]  blood glucose meter kit and supplies KIT Dispense based on patient and insurance preference. Use up to four times daily as directed. (FOR ICD-9 250.00, 250.01). 03/11/18   Marguerita Merles Latif, DO  cholecalciferol (VITAMIN D) 1000 units tablet Take 1,000 Units by mouth daily.    [provider]  Dulaglutide (TRULICITY) 0.75 MG/0.5ML SOPN Inject 0.75 mg into the skin once a week. 01/18/23   Hoy Register, MD  EPINEPHrine (EPIPEN 2-PAK) 0.3 mg/0.3 mL IJ SOAJ injection Inject 0.3 mLs (0.3 mg total) into the muscle once as needed (for severe allergic reaction). Patient taking differently: Inject 0.3 mg into the muscle once as needed for anaphylaxis. 06/28/20   Molpus, John, MD  famotidine (PEPCID) 20 MG tablet Take 1 tablet (20 mg total) by mouth 2 (two) times daily. 06/05/22   Gerhard Munch, MD  famotidine (PEPCID) 40 MG tablet 1 hour prior to CT 01/13/22   Vanna Scotland, MD  gabapentin (NEURONTIN) 300 MG  capsule Take 1-2 capsules (300-600 mg total) by mouth 3 (three) times daily. Patient not taking: Reported on 02/03/2015 02/20/14 06/08/24  Constant, Peggy, MD  gabapentin (NEURONTIN) 300 MG capsule Take 1 capsule (300 mg total) by mouth 3 (three) times daily as needed. 06/09/23   Long, Arlyss Repress, MD  HYDROcodone-acetaminophen (NORCO/VICODIN) 5-325 MG tablet Take 1 tablet by mouth every 6 (six) hours as needed for severe pain. 12/17/22   Zadie Rhine, MD  lisinopril (ZESTRIL) 10 MG tablet Take 1 tablet (10 mg total) by mouth daily. 01/05/22   Grayce Sessions, NP  methocarbamol (ROBAXIN) 500 MG tablet Take 1 tablet (500 mg total) by mouth 2 (two) times daily. 12/17/22   Zadie Rhine, MD  ondansetron (ZOFRAN-ODT) 4 MG disintegrating tablet Take 1 tablet (4 mg total) by mouth every 8 (eight) hours as needed for nausea or vomiting. 05/03/22   Pricilla Loveless, MD  OVER THE COUNTER MEDICATION Take 2 tablets by mouth daily. Amberen    [provider]  oxyCODONE-acetaminophen (PERCOCET/ROXICET) 5-325 MG tablet Take 1 tablet by mouth every 6 (six) hours as needed for severe pain. 06/09/23   Long, Arlyss Repress, MD  senna-docusate (SENOKOT-S) 8.6-50 MG tablet Take 1 tablet by mouth at bedtime as needed for mild constipation. 06/09/23   Long, Arlyss Repress, MD  sucralfate (CARAFATE) 1 g tablet Take 1 tablet (1 g total) by mouth 4 (four) times daily -  with meals  and at bedtime. 06/05/22   Gerhard Munch, MD  traMADol (ULTRAM) 50 MG tablet Take 1 tablet (50 mg total) by mouth every 6 (six) hours as needed. 08/10/23   Geoffery Lyons, MD  vitamin B-12 (CYANOCOBALAMIN) 1000 MCG tablet Take 1,000 mcg by mouth daily.    [provider]  dicyclomine (BENTYL) 20 MG tablet Take 1 tablet (20 mg total) by mouth 2 (two) times daily as needed for spasms. 02/20/19 06/28/20  Fawze, Mina A, PA-C  ipratropium-albuterol (DUONEB) 0.5-2.5 (3) MG/3ML SOLN Take 3 mLs by nebulization every 4 (four) hours as needed. Patient not  taking: Reported on 09/22/2018 03/11/18 06/07/19  Marguerita Merles Latif, DO  omeprazole (PRILOSEC) 20 MG capsule Take 1 capsule (20 mg total) by mouth 2 (two) times daily before a meal. 06/07/19 06/28/20  Pollina, Canary Brim, MD      Allergies    Bee venom, Diamox [acetazolamide], Nsaids, Omnipaque [iohexol], Tolmetin, Compazine [prochlorperazine], Iopamidol, Lactose intolerance (gi), and Reglan [metoclopramide]    Review of Systems   Review of Systems  Physical Exam Updated Vital Signs BP (!) 164/101   Pulse 82   Temp 98 F (36.7 C) (Temporal)   Resp 20   Ht 5' (1.524 m)   Wt 71 kg   LMP 06/14/2003   SpO2 100%   BMI 30.57 kg/m  Physical Exam Constitutional:      General: She is not in acute distress. Cardiovascular:     Rate and Rhythm: Normal rate.     Pulses: Normal pulses.  Musculoskeletal:     Comments: Left wrist: Cystlike structure noted on the thumb side of patient's wrist that is tender to palpation No bony abnormalities palpated 5/5 left-sided grip strength, elbow flexion/extension, unwilling to range wrist as this causes pain Pain not out of proportion Compartments soft  Skin:    General: Skin is warm and dry.     Capillary Refill: Capillary refill takes less than 2 seconds.     Comments: No overlying skin color changes  Neurological:     Mental Status: She is alert.     Comments: Sensation intact distally  Psychiatric:        Mood and Affect: Mood normal.     ED Results / Procedures / Treatments   Labs (all labs ordered are listed, but only abnormal results are displayed) Labs Reviewed - No data to display  EKG None  Radiology DG Wrist Complete Left  Result Date: 08/29/2023 CLINICAL DATA:  Larey Seat down a few weeks ago. Swelling has gone down but pain has increased and has a small bump laterally at the wrist status painful to touch. EXAM: LEFT WRIST - COMPLETE 3+ VIEW COMPARISON:  Radiographs 08/10/2023 FINDINGS: The area of clinical concern was noted by  the technologist. No radiographic abnormality to correlate with area of clinical concern. No acute fracture or dislocation. Soft tissues are radiographically unremarkable. IMPRESSION: No acute osseous abnormality. Electronically Signed   By: Minerva Fester M.D.   On: 08/29/2023 21:55    Procedures Procedures    Medications Ordered in ED Medications - No data to display  ED Course/ Medical Decision Making/ A&P                                 Medical Decision Making Amount and/or Complexity of Data Reviewed Radiology: ordered.   [facility] 52 y.o. presented today for left wrist pain. Working DDx that I considered at  this time includes, but not limited to, ganglion cyst, de Quervain's tenosynovitis, contusion, strain/sprain, fracture, dislocation, neurovascular compromise, septic joint, ischemic limb, compartment syndrome.  R/o DDx: de Quervain's tenosynovitis, contusion, strain/sprain, fracture, dislocation, neurovascular compromise, septic joint, ischemic limb, compartment syndrome: These are considered less likely due to history of present illness, physical exam, labs/imaging findings.  Review of prior external notes: 08/10/2023 ED  Unique Tests and My Interpretation:  Left wrist x-ray: No acute changes  Discussion with Independent Historian:  Family member  Discussion of Management of Tests: None  Risk: Low: based on diagnostic testing/clinical impression and treatment plan  Risk Stratification Score: none  Plan: On exam patient was no acute distress with stable vitals.  On exam patient did have what appears to be a cyst on the thumb side of her left wrist lying on a tendon that was extremity tender to palpation.  Patient is neurovascularly intact.  X-rays negative for fracture.  No overlying skin color changes or other signs indicative of infection.  At this time I suspect patient has an unusual ganglion cyst as she does have a cyst on one of her tendons for her thumb I  will have her follow-up with a hand specialist that she is endorsing symptoms.  Patient cannot have ibuprofen and so I recommend she takes Tylenol every 6 hours as needed for pain.  Patient given work note.  Patient was given return precautions. Patient stable for discharge at this time.  Patient verbalized understanding of plan.         Final Clinical Impression(s) / ED Diagnoses Final diagnoses:  Left wrist pain    Rx / DC Orders ED Discharge Orders     None         Remi Deter 08/29/23 2243    Gwyneth Sprout, MD 09/02/23 0028

## 2023-08-29 NOTE — Discharge Instructions (Signed)
Please follow-up with a hand specialist I have attached you for you today.  Today your x-rays were negative for any fractures however you do have a cyst that is most likely causing her pain and will need to be evaluated by hand specialist.  You may use Tylenol every 6 hours as needed for pain.  If symptoms change or worsen please return to ER.

## 2023-11-17 ENCOUNTER — Other Ambulatory Visit: Payer: Self-pay

## 2023-11-17 ENCOUNTER — Inpatient Hospital Stay (HOSPITAL_BASED_OUTPATIENT_CLINIC_OR_DEPARTMENT_OTHER)
Admission: EM | Admit: 2023-11-17 | Discharge: 2023-11-22 | DRG: 552 | Disposition: A | Discharge status: Home / Self Care | Payer: 59 | Attending: Family Medicine | Admitting: Family Medicine

## 2023-11-17 ENCOUNTER — Emergency Department (HOSPITAL_BASED_OUTPATIENT_CLINIC_OR_DEPARTMENT_OTHER): Payer: 59

## 2023-11-17 DIAGNOSIS — Z820 Family history of epilepsy and other diseases of the nervous system: Secondary | ICD-10-CM

## 2023-11-17 DIAGNOSIS — Z888 Allergy status to other drugs, medicaments and biological substances status: Secondary | ICD-10-CM

## 2023-11-17 DIAGNOSIS — Z5971 Insufficient health insurance coverage: Secondary | ICD-10-CM

## 2023-11-17 DIAGNOSIS — G40909 Epilepsy, unspecified, not intractable, without status epilepticus: Secondary | ICD-10-CM | POA: Diagnosis present

## 2023-11-17 DIAGNOSIS — M5481 Occipital neuralgia: Secondary | ICD-10-CM | POA: Diagnosis not present

## 2023-11-17 DIAGNOSIS — E1165 Type 2 diabetes mellitus with hyperglycemia: Secondary | ICD-10-CM | POA: Diagnosis present

## 2023-11-17 DIAGNOSIS — Z800 Family history of malignant neoplasm of digestive organs: Secondary | ICD-10-CM

## 2023-11-17 DIAGNOSIS — R935 Abnormal findings on diagnostic imaging of other abdominal regions, including retroperitoneum: Secondary | ICD-10-CM

## 2023-11-17 DIAGNOSIS — R519 Headache, unspecified: Secondary | ICD-10-CM

## 2023-11-17 DIAGNOSIS — Z86718 Personal history of other venous thrombosis and embolism: Secondary | ICD-10-CM

## 2023-11-17 DIAGNOSIS — Z7985 Long-term (current) use of injectable non-insulin antidiabetic drugs: Secondary | ICD-10-CM

## 2023-11-17 DIAGNOSIS — Z91030 Bee allergy status: Secondary | ICD-10-CM

## 2023-11-17 DIAGNOSIS — Z91041 Radiographic dye allergy status: Secondary | ICD-10-CM

## 2023-11-17 DIAGNOSIS — I10 Essential (primary) hypertension: Secondary | ICD-10-CM | POA: Diagnosis present

## 2023-11-17 DIAGNOSIS — R1013 Epigastric pain: Secondary | ICD-10-CM | POA: Diagnosis not present

## 2023-11-17 DIAGNOSIS — K5710 Diverticulosis of small intestine without perforation or abscess without bleeding: Secondary | ICD-10-CM | POA: Diagnosis present

## 2023-11-17 DIAGNOSIS — Z79899 Other long term (current) drug therapy: Secondary | ICD-10-CM

## 2023-11-17 DIAGNOSIS — M329 Systemic lupus erythematosus, unspecified: Secondary | ICD-10-CM | POA: Diagnosis present

## 2023-11-17 DIAGNOSIS — K219 Gastro-esophageal reflux disease without esophagitis: Secondary | ICD-10-CM | POA: Diagnosis present

## 2023-11-17 DIAGNOSIS — Z886 Allergy status to analgesic agent status: Secondary | ICD-10-CM

## 2023-11-17 DIAGNOSIS — G43909 Migraine, unspecified, not intractable, without status migrainosus: Secondary | ICD-10-CM | POA: Diagnosis present

## 2023-11-17 DIAGNOSIS — Z823 Family history of stroke: Secondary | ICD-10-CM

## 2023-11-17 DIAGNOSIS — Z803 Family history of malignant neoplasm of breast: Secondary | ICD-10-CM

## 2023-11-17 DIAGNOSIS — Z8041 Family history of malignant neoplasm of ovary: Secondary | ICD-10-CM

## 2023-11-17 DIAGNOSIS — R112 Nausea with vomiting, unspecified: Secondary | ICD-10-CM | POA: Insufficient documentation

## 2023-11-17 DIAGNOSIS — E739 Lactose intolerance, unspecified: Secondary | ICD-10-CM | POA: Diagnosis present

## 2023-11-17 DIAGNOSIS — R531 Weakness: Principal | ICD-10-CM

## 2023-11-17 LAB — CBC
HCT: 40.5 % (ref 36.0–46.0)
Hemoglobin: 12.9 g/dL (ref 12.0–15.0)
MCH: 26.7 pg (ref 26.0–34.0)
MCHC: 31.9 g/dL (ref 30.0–36.0)
MCV: 83.9 fL (ref 80.0–100.0)
Platelets: 317 10*3/uL (ref 150–400)
RBC: 4.83 MIL/uL (ref 3.87–5.11)
RDW: 13.2 % (ref 11.5–15.5)
WBC: 7.3 10*3/uL (ref 4.0–10.5)
nRBC: 0 % (ref 0.0–0.2)

## 2023-11-17 NOTE — ED Triage Notes (Addendum)
Right arm weakness x1 week. Headache x3 weeks. Shoulder and neck pain when abducting right arm. Face symmetrical. Also reports RUQ pain tender to touch. -N/-V. -CP, SOB.

## 2023-11-17 NOTE — ED Notes (Signed)
Labs delay for difficult stick/US PIV.

## 2023-11-17 NOTE — ED Provider Notes (Signed)
EMERGENCY DEPARTMENT AT [facility]  Provider Note  CSN: [id number] Arrival date & time: 11/17/23 2244  History Chief Complaint  Patient presents with   Extremity Weakness    Donna [name] is a 52 y.o. female with history lupus, migraine, seizures and DVT presents for multiple complaints. She reports she had a flu-like illness about 3 weeks ago that had mostly resolved but had a R sided headache which has been present for about a week and more severe x 2 days radiating into R neck and associated with a 'heaviness' in her R arm. She reports feeling weak in R arm and leg.   She also reports RUQ abdominal pain and nausea started tonight. No vomiting. No history of gall bladder problems.    Home Medications Prior to Admission medications   Medication Sig Start Date End Date Taking? Authorizing Provider  acetaminophen (TYLENOL) 500 MG tablet Take 500 mg by mouth every 6 (six) hours as needed for moderate pain.    [provider]  blood glucose meter kit and supplies KIT Dispense based on patient and insurance preference. Use up to four times daily as directed. (FOR ICD-9 250.00, 250.01). 03/11/18   Marguerita Merles Latif, DO  cholecalciferol (VITAMIN D) 1000 units tablet Take 1,000 Units by mouth daily.    [provider]  Dulaglutide (TRULICITY) 0.75 MG/0.5ML SOPN Inject 0.75 mg into the skin once a week. 01/18/23   Hoy Register, MD  EPINEPHrine (EPIPEN 2-PAK) 0.3 mg/0.3 mL IJ SOAJ injection Inject 0.3 mLs (0.3 mg total) into the muscle once as needed (for severe allergic reaction). Patient taking differently: Inject 0.3 mg into the muscle once as needed for anaphylaxis. 06/28/20   Molpus, John, MD  famotidine (PEPCID) 20 MG tablet Take 1 tablet (20 mg total) by mouth 2 (two) times daily. 06/05/22   Gerhard Munch, MD  famotidine (PEPCID) 40 MG tablet 1 hour prior to CT 01/13/22   Vanna Scotland, MD  gabapentin (NEURONTIN) 300 MG capsule Take 1-2 capsules  (300-600 mg total) by mouth 3 (three) times daily. Patient not taking: Reported on 02/03/2015 02/20/14 06/08/24  Constant, Peggy, MD  gabapentin (NEURONTIN) 300 MG capsule Take 1 capsule (300 mg total) by mouth 3 (three) times daily as needed. 06/09/23   Long, Arlyss Repress, MD  HYDROcodone-acetaminophen (NORCO/VICODIN) 5-325 MG tablet Take 1 tablet by mouth every 6 (six) hours as needed for severe pain. 12/17/22   Zadie Rhine, MD  lisinopril (ZESTRIL) 10 MG tablet Take 1 tablet (10 mg total) by mouth daily. 01/05/22   Grayce Sessions, NP  methocarbamol (ROBAXIN) 500 MG tablet Take 1 tablet (500 mg total) by mouth 2 (two) times daily. 12/17/22   Zadie Rhine, MD  ondansetron (ZOFRAN-ODT) 4 MG disintegrating tablet Take 1 tablet (4 mg total) by mouth every 8 (eight) hours as needed for nausea or vomiting. 05/03/22   Pricilla Loveless, MD  OVER THE COUNTER MEDICATION Take 2 tablets by mouth daily. Amberen    [provider]  oxyCODONE-acetaminophen (PERCOCET/ROXICET) 5-325 MG tablet Take 1 tablet by mouth every 6 (six) hours as needed for severe pain. 06/09/23   Long, Arlyss Repress, MD  senna-docusate (SENOKOT-S) 8.6-50 MG tablet Take 1 tablet by mouth at bedtime as needed for mild constipation. 06/09/23   Long, Arlyss Repress, MD  sucralfate (CARAFATE) 1 g tablet Take 1 tablet (1 g total) by mouth 4 (four) times daily -  with meals and at bedtime. 06/05/22   Gerhard Munch, MD  traMADol (ULTRAM) 50 MG tablet Take 1 tablet (50 mg total) by mouth every 6 (six) hours as needed. 08/10/23   Geoffery Lyons, MD  vitamin B-12 (CYANOCOBALAMIN) 1000 MCG tablet Take 1,000 mcg by mouth daily.    [provider]  dicyclomine (BENTYL) 20 MG tablet Take 1 tablet (20 mg total) by mouth 2 (two) times daily as needed for spasms. 02/20/19 06/28/20  Fawze, Mina A, PA-C  ipratropium-albuterol (DUONEB) 0.5-2.5 (3) MG/3ML SOLN Take 3 mLs by nebulization every 4 (four) hours as needed. Patient not taking: Reported on 09/22/2018  03/11/18 06/07/19  Marguerita Merles Latif, DO  omeprazole (PRILOSEC) 20 MG capsule Take 1 capsule (20 mg total) by mouth 2 (two) times daily before a meal. 06/07/19 06/28/20  Pollina, Canary Brim, MD     Allergies    Bee venom, Diamox [acetazolamide], Nsaids, Omnipaque [iohexol], Tolmetin, Compazine [prochlorperazine], Iopamidol, Lactose intolerance (gi), and Reglan [metoclopramide]   Review of Systems   Review of Systems Please see HPI for pertinent positives and negatives  Physical Exam LMP 06/14/2003   Physical Exam Vitals and nursing note reviewed.  Constitutional:      Appearance: Normal appearance.  HENT:     Head: Normocephalic and atraumatic.     Nose: Nose normal.     Mouth/Throat:     Mouth: Mucous membranes are moist.  Eyes:     Extraocular Movements: Extraocular movements intact.     Conjunctiva/sclera: Conjunctivae normal.  Cardiovascular:     Rate and Rhythm: Normal rate.  Pulmonary:     Effort: Pulmonary effort is normal.     Breath sounds: Normal breath sounds.  Abdominal:     General: Abdomen is flat.     Palpations: Abdomen is soft.     Tenderness: There is abdominal tenderness (RUQ). There is no guarding.  Musculoskeletal:        General: No swelling. Normal range of motion.     Cervical back: Neck supple.  Skin:    General: Skin is warm and dry.  Neurological:     General: No focal deficit present.     Mental Status: She is alert and oriented to person, place, and time.     Cranial Nerves: No cranial nerve deficit.     Sensory: No sensory deficit.     Motor: Weakness (RUE and RLE, some weakness seems due to R shoulder pain, but grip strength is weak on the right) present.  Psychiatric:        Mood and Affect: Mood normal.     ED Results / Procedures / Treatments   EKG None  Procedures Procedures  Medications Ordered in the ED Medications - No data to display  Initial Impression and Plan  Patient here with multiple complaints, most  concerning of which is her R sided headache and limb weakness. Consider complex migraine, stroke or brain mass. She is outside the window for TNK or LVO intervention, so not a Code Stroke. Will send for CT. Will check labs for stroke as well as for abdominal pain   ED Course       MDM Rules/Calculators/A&P Medical Decision Making Amount and/or Complexity of Data Reviewed Labs: ordered. Radiology: ordered.     Final Clinical Impression(s) / ED Diagnoses Final diagnoses:  None    Rx / DC Orders ED Discharge Orders     None

## 2023-11-18 ENCOUNTER — Encounter (HOSPITAL_COMMUNITY): Payer: Self-pay | Admitting: Internal Medicine

## 2023-11-18 ENCOUNTER — Emergency Department (HOSPITAL_BASED_OUTPATIENT_CLINIC_OR_DEPARTMENT_OTHER): Payer: 59

## 2023-11-18 ENCOUNTER — Observation Stay (HOSPITAL_COMMUNITY): Payer: 59

## 2023-11-18 DIAGNOSIS — Z800 Family history of malignant neoplasm of digestive organs: Secondary | ICD-10-CM | POA: Diagnosis not present

## 2023-11-18 DIAGNOSIS — Z803 Family history of malignant neoplasm of breast: Secondary | ICD-10-CM | POA: Diagnosis not present

## 2023-11-18 DIAGNOSIS — E1165 Type 2 diabetes mellitus with hyperglycemia: Secondary | ICD-10-CM | POA: Diagnosis not present

## 2023-11-18 DIAGNOSIS — K219 Gastro-esophageal reflux disease without esophagitis: Secondary | ICD-10-CM | POA: Diagnosis not present

## 2023-11-18 DIAGNOSIS — Z886 Allergy status to analgesic agent status: Secondary | ICD-10-CM | POA: Diagnosis not present

## 2023-11-18 DIAGNOSIS — Z86718 Personal history of other venous thrombosis and embolism: Secondary | ICD-10-CM | POA: Diagnosis not present

## 2023-11-18 DIAGNOSIS — E739 Lactose intolerance, unspecified: Secondary | ICD-10-CM | POA: Diagnosis not present

## 2023-11-18 DIAGNOSIS — Z79899 Other long term (current) drug therapy: Secondary | ICD-10-CM | POA: Diagnosis not present

## 2023-11-18 DIAGNOSIS — K5710 Diverticulosis of small intestine without perforation or abscess without bleeding: Secondary | ICD-10-CM | POA: Diagnosis not present

## 2023-11-18 DIAGNOSIS — R531 Weakness: Principal | ICD-10-CM

## 2023-11-18 DIAGNOSIS — Z91030 Bee allergy status: Secondary | ICD-10-CM | POA: Diagnosis not present

## 2023-11-18 DIAGNOSIS — M329 Systemic lupus erythematosus, unspecified: Secondary | ICD-10-CM | POA: Diagnosis not present

## 2023-11-18 DIAGNOSIS — Z820 Family history of epilepsy and other diseases of the nervous system: Secondary | ICD-10-CM | POA: Diagnosis not present

## 2023-11-18 DIAGNOSIS — Z5971 Insufficient health insurance coverage: Secondary | ICD-10-CM | POA: Diagnosis not present

## 2023-11-18 DIAGNOSIS — Z823 Family history of stroke: Secondary | ICD-10-CM | POA: Diagnosis not present

## 2023-11-18 DIAGNOSIS — I10 Essential (primary) hypertension: Secondary | ICD-10-CM | POA: Diagnosis not present

## 2023-11-18 DIAGNOSIS — Z8041 Family history of malignant neoplasm of ovary: Secondary | ICD-10-CM | POA: Diagnosis not present

## 2023-11-18 DIAGNOSIS — Z91041 Radiographic dye allergy status: Secondary | ICD-10-CM | POA: Diagnosis not present

## 2023-11-18 DIAGNOSIS — G43909 Migraine, unspecified, not intractable, without status migrainosus: Secondary | ICD-10-CM | POA: Diagnosis not present

## 2023-11-18 DIAGNOSIS — G40909 Epilepsy, unspecified, not intractable, without status epilepticus: Secondary | ICD-10-CM | POA: Diagnosis not present

## 2023-11-18 DIAGNOSIS — R1013 Epigastric pain: Secondary | ICD-10-CM | POA: Diagnosis present

## 2023-11-18 DIAGNOSIS — M5481 Occipital neuralgia: Secondary | ICD-10-CM | POA: Diagnosis not present

## 2023-11-18 DIAGNOSIS — Z7985 Long-term (current) use of injectable non-insulin antidiabetic drugs: Secondary | ICD-10-CM | POA: Diagnosis not present

## 2023-11-18 DIAGNOSIS — Z888 Allergy status to other drugs, medicaments and biological substances status: Secondary | ICD-10-CM | POA: Diagnosis not present

## 2023-11-18 LAB — COMPREHENSIVE METABOLIC PANEL
ALT: 18 U/L (ref 0–44)
AST: 19 U/L (ref 15–41)
Albumin: 4.7 g/dL (ref 3.5–5.0)
Alkaline Phosphatase: 96 U/L (ref 38–126)
Anion gap: 9 (ref 5–15)
BUN: 18 mg/dL (ref 6–20)
CO2: 28 mmol/L (ref 22–32)
Calcium: 10.4 mg/dL — ABNORMAL HIGH (ref 8.9–10.3)
Chloride: 101 mmol/L (ref 98–111)
Creatinine, Ser: 0.86 mg/dL (ref 0.44–1.00)
GFR, Estimated: >60 mL/min (ref >60)
Glucose, Bld: 180 mg/dL — ABNORMAL HIGH (ref 70–99)
Potassium: 3.8 mmol/L (ref 3.5–5.1)
Sodium: 138 mmol/L (ref 135–145)
Total Bilirubin: 0.4 mg/dL (ref <1.2)
Total Protein: 8.2 g/dL — ABNORMAL HIGH (ref 6.5–8.1)

## 2023-11-18 LAB — HIV ANTIBODY (ROUTINE TESTING W REFLEX): HIV Screen 4th Generation wRfx: NONREACTIVE

## 2023-11-18 LAB — GLUCOSE, CAPILLARY
Glucose-Capillary: 371 mg/dL — ABNORMAL HIGH (ref 70–99)
Glucose-Capillary: 316 mg/dL — ABNORMAL HIGH (ref 70–99)
Glucose-Capillary: 253 mg/dL — ABNORMAL HIGH (ref 70–99)
Glucose-Capillary: 171 mg/dL — ABNORMAL HIGH (ref 70–99)

## 2023-11-18 LAB — LIPASE, BLOOD: Lipase: 25 U/L (ref 11–51)

## 2023-11-18 MED ORDER — INSULIN ASPART 100 UNIT/ML IJ SOLN: 0.0000 [IU] | Freq: Three times a day (TID) | INTRAMUSCULAR | Status: DC

## 2023-11-18 MED ORDER — DEXAMETHASONE SODIUM PHOSPHATE 10 MG/ML IJ SOLN
10.0000 mg | Freq: Once | INTRAMUSCULAR | Status: AC
  Administered 2023-11-18: 10 mg via INTRAVENOUS
  Filled 2023-11-18: qty 1

## 2023-11-18 MED ORDER — INSULIN ASPART 100 UNIT/ML IJ SOLN: 0.0000 [IU] | Freq: Every day | INTRAMUSCULAR | Status: DC

## 2023-11-18 MED ORDER — INSULIN GLARGINE-YFGN 100 UNIT/ML ~~LOC~~ SOLN
5.0000 [IU] | Freq: Every day | SUBCUTANEOUS | Status: DC
  Administered 2023-11-18 – 2023-11-21 (×4): 5 [IU] via SUBCUTANEOUS
  Filled 2023-11-18 (×5): qty 0.05

## 2023-11-18 MED ORDER — BUTALBITAL-APAP-CAFFEINE 50-325-40 MG PO TABS
1.0000 | ORAL_TABLET | Freq: Four times a day (QID) | ORAL | Status: AC | PRN
  Administered 2023-11-19: 1 via ORAL
  Filled 2023-11-18: qty 1

## 2023-11-18 MED ORDER — FENTANYL CITRATE PF 50 MCG/ML IJ SOSY
50.0000 ug | PREFILLED_SYRINGE | Freq: Once | INTRAMUSCULAR | Status: AC
  Administered 2023-11-18: 50 ug via INTRAVENOUS
  Filled 2023-11-18: qty 1

## 2023-11-18 MED ORDER — HYDROMORPHONE HCL 1 MG/ML IJ SOLN
1.0000 mg | Freq: Once | INTRAMUSCULAR | Status: AC
  Administered 2023-11-18: 1 mg via INTRAVENOUS
  Filled 2023-11-18: qty 1

## 2023-11-18 MED ORDER — LORAZEPAM 2 MG/ML IJ SOLN
1.0000 mg | Freq: Once | INTRAMUSCULAR | Status: AC
  Administered 2023-11-18: 1 mg via INTRAVENOUS
  Filled 2023-11-18: qty 1

## 2023-11-18 MED ORDER — INSULIN GLARGINE-YFGN 100 UNIT/ML ~~LOC~~ SOLN
5.0000 [IU] | Freq: Once | SUBCUTANEOUS | Status: DC
  Filled 2023-11-18: qty 0.05

## 2023-11-18 MED ORDER — HYDRALAZINE HCL 20 MG/ML IJ SOLN: 10.0000 mg | Freq: Four times a day (QID) | INTRAMUSCULAR | Status: DC | PRN

## 2023-11-18 MED ORDER — ACETAMINOPHEN 650 MG RE SUPP: 650.0000 mg | Freq: Four times a day (QID) | RECTAL | Status: DC | PRN

## 2023-11-18 MED ORDER — ALBUTEROL SULFATE (2.5 MG/3ML) 0.083% IN NEBU
2.5000 mg | INHALATION_SOLUTION | Freq: Four times a day (QID) | RESPIRATORY_TRACT | Status: DC | PRN
Start: 2023-11-18 — End: 2023-11-23

## 2023-11-18 MED ORDER — PANTOPRAZOLE SODIUM 40 MG PO TBEC
40.0000 mg | DELAYED_RELEASE_TABLET | Freq: Every day | ORAL | Status: DC
  Administered 2023-11-19: 40 mg via ORAL
  Filled 2023-11-18: qty 1

## 2023-11-18 MED ORDER — HYDROMORPHONE HCL 1 MG/ML IJ SOLN
0.5000 mg | INTRAMUSCULAR | Status: DC | PRN
  Administered 2023-11-18 – 2023-11-19 (×3): 0.5 mg via INTRAVENOUS
  Filled 2023-11-18 (×3): qty 0.5

## 2023-11-18 MED ORDER — ACETAMINOPHEN 325 MG PO TABS: 650.0000 mg | ORAL_TABLET | Freq: Four times a day (QID) | ORAL | Status: DC | PRN

## 2023-11-18 MED ORDER — STROKE: EARLY STAGES OF RECOVERY BOOK
Freq: Once | Status: AC
  Filled 2023-11-18: qty 1

## 2023-11-18 MED ORDER — INSULIN ASPART 100 UNIT/ML IJ SOLN
0.0000 [IU] | Freq: Three times a day (TID) | INTRAMUSCULAR | Status: DC
  Administered 2023-11-18: 11 [IU] via SUBCUTANEOUS
  Administered 2023-11-18: 8 [IU] via SUBCUTANEOUS
  Administered 2023-11-19: 5 [IU] via SUBCUTANEOUS
  Administered 2023-11-19: 2 [IU] via SUBCUTANEOUS
  Administered 2023-11-20 (×2): 3 [IU] via SUBCUTANEOUS
  Administered 2023-11-20 – 2023-11-21 (×2): 2 [IU] via SUBCUTANEOUS
  Administered 2023-11-21: 5 [IU] via SUBCUTANEOUS
  Administered 2023-11-22: 2 [IU] via SUBCUTANEOUS
  Administered 2023-11-22: 3 [IU] via SUBCUTANEOUS

## 2023-11-18 MED ORDER — ENOXAPARIN SODIUM 40 MG/0.4ML IJ SOSY
40.0000 mg | PREFILLED_SYRINGE | INTRAMUSCULAR | Status: DC
Start: 2023-11-18 — End: 2023-11-23
  Administered 2023-11-18 – 2023-11-22 (×5): 40 mg via SUBCUTANEOUS
  Filled 2023-11-18 (×5): qty 0.4

## 2023-11-18 MED ORDER — ONDANSETRON HCL 4 MG/2ML IJ SOLN
4.0000 mg | Freq: Four times a day (QID) | INTRAMUSCULAR | Status: DC | PRN
  Administered 2023-11-18 – 2023-11-22 (×10): 4 mg via INTRAVENOUS
  Filled 2023-11-18 (×10): qty 2

## 2023-11-18 MED ORDER — ONDANSETRON HCL 4 MG/2ML IJ SOLN
4.0000 mg | Freq: Once | INTRAMUSCULAR | Status: AC
  Administered 2023-11-18: 4 mg via INTRAVENOUS
  Filled 2023-11-18: qty 2

## 2023-11-18 NOTE — ED Notes (Signed)
 [facility] called for transport

## 2023-11-18 NOTE — Progress Notes (Addendum)
Plan of Care Note for accepted transfer  Patient: Donna [name]              ZDG:[id number]  DOA: 11/17/2023     Facility requesting transfer: [facility] emergency department Requesting Provider: Dr. Bernette Mayers  Reason for transfer: Right-sided weakness for 2 days, right-sided headache for 2 days, and duodenitis.  Facility course: Krisy Balkema is a 52 y.o. female with history lupus, migraine, essential hypertension, DM type II and GERD presents for multiple complaints. She reports she had a flu-like illness about 3 weeks ago that had mostly resolved but had a R sided headache which has been present for about a week and more severe x 2 days radiating into R neck and associated with a 'heaviness' in her R arm. She reports feeling weak in R arm and leg.    Patient is also complaining of right upper quadrant abdominal pain with associated nausea that started tonight.  Denies any vomiting.  At presentation to ED patient is hemodynamically stable except 1 episode blood pressure was elevated 163/82 which improved to 146/79.  CBC unremarkable. CMP unremarkable except slight elevated calcium 10.4. Normal lipase.  CT head unremarkable for any acute intracranial abnormality.   CT abdomen pelvis: 1. Short segment of very mildly thickened proximal duodenum which may represent mild duodenitis. 2. Stable left renal cyst. No follow-up imaging is recommended. This recommendation follows ACR consensus guidelines: Management of the Incidental Renal Mass on CT: A White Paper of the ACR Incidental Findings Committee. J Am Coll Radiol [phone].  ED physician reported that after fentanyl abdominal pain and headache has been resolved however he is concerned that patient is still has right-sided weakness which is still persistent for last 2 days.  Concern for stroke versus complex migraine but patient is out of window for treatment stroke.  Patient needs MRI for further evaluation which cannot be done at  [facility] emergency department. Hospitalist has been contacted for further evaluation management of right-sided upper weakness and need for a MRI.   Plan of care: The patient is accepted for admission for observation status to Telemetry unit, at [facility].  Check [email or web address] for on-call coverage.  [facility] will assume care on arrival to accepting facility. Until arrival, medical decision making responsibilities remain with the EDP.  However, [facility] available 24/7 for questions and assistance.   Nursing staff please page [facility] Admits and Consults [phone]) as soon as the patient arrives to the hospital.    Author: Tereasa Coop, MD  11/18/2023  [facility]

## 2023-11-18 NOTE — ED Notes (Signed)
Attempted to give report to nurse on [facility]. Secretary given ED number to call back. Reported that [facility] was on the way.

## 2023-11-18 NOTE — H&P (Signed)
[facility] History and Physical  Donna [name] ZHY:[id number] DOB: [birth year] DOA: 11/17/2023 PCP: Donna Contras, MD  Presented from: Home Chief Complaint: Persistent headache and right-sided weakness  History of Present Illness: Donna [name] is a 52 y.o. female with PMH significant for DM2 not on meds, seizures not on meds, SLE, remote history of migraine. 12/4, patient presented to the ED at [facility] with complaint of persistent headache and right-sided weakness. 3 weeks ago, patient had flulike symptoms with fever, cough, shortness of breath and pain.  She said she tested negative for COVID.  She gradually started to feel better with supportive care.  However continued to have cough and headache which have been persistent since then.  Headache is mostly in the right side and persistent.  She used to have migraine as a teenager and has not had it for last several decades.  Few days ago, she also noticed weakness on her right upper and lower extremities. She also developed abdominal pain and nausea.  Last bowel meant was yesterday.  In the ED, afebrile, hemodynamically stable Labs are unremarkable except for elevated blood glucose and slight elevated calcium level.  Lipase level normal LFTs normal. CT head was unremarkable CT abdomen pelvis showed Short segment of very mildly thickened proximal duodenum which may represent mild duodenitis.  Given her findings, migraine versus stroke was suspected Patient was accepted to hospital service at [facility] last night. MRI brain was not available at [facility] ED  I saw the patient after she arrived at [facility] today.  At the time of my evaluation, patient is lying down in bed.  Feels tired.  Daughter at bedside. Alert, awake, oriented x 3.  Able to give the details of the history. She states she used to be on Trulicity in the past but insurance dropped it and she has not been taking any medicine for diabetes in a while. Shas 3/5 weakness on  right arm and right hip. No temporal area tenderness. Also has significant epigastric tenderness History reviewed and detailed as above.  Review of Systems:  All systems were reviewed and were negative unless otherwise mentioned in the HPI   Past medical history: Past Medical History:  Diagnosis Date   DVT (deep venous thrombosis) (HCC)    right arm   Endometriosis    Lupus (systemic lupus erythematosus) (HCC) 2007   Migraine    Seizures (HCC)    last sz 06/2013, no meds    Past surgical history: Past Surgical History:  Procedure Laterality Date   ABDOMINAL HYSTERECTOMY     x2 partial   CESAREAN SECTION     x3   MASS EXCISION  02/2014   abd    Social History:  reports that she has never smoked. She has never used smokeless tobacco. She reports that she does not drink alcohol and does not use drugs.  Allergies:  Allergies  Allergen Reactions   Bee Venom Anaphylaxis   Diamox [Acetazolamide] Anaphylaxis   Nsaids Anaphylaxis   Omnipaque [Iohexol] Anaphylaxis   Tolmetin Anaphylaxis   Compazine [Prochlorperazine] Hives   Iopamidol Hives   Lactose Intolerance (Gi) Nausea And Vomiting   Reglan [Metoclopramide] Hives   Bee venom, Diamox [acetazolamide], Nsaids, Omnipaque [iohexol], Tolmetin, Compazine [prochlorperazine], Iopamidol, Lactose intolerance (gi), and Reglan [metoclopramide]   Family history:  Family History  Problem Relation Age of Onset   CVA Mother 22   Alzheimer's disease Father    Breast cancer Sister    Ovarian cancer Sister    Stomach cancer  Sister    Breast cancer Maternal Aunt    Ovarian cancer Maternal Aunt    Stomach cancer Maternal Aunt    Birth defects Maternal Uncle    Colon cancer Neg Hx    Esophageal cancer Neg Hx    Pancreatic cancer Neg Hx      Physical Exam: Vitals:   11/18/23 0841 11/18/23 0900 11/18/23 0959 11/18/23 1149  BP: 129/80 123/74 (!) 149/89 136/86  Pulse: (!) 103 92 94 80  Resp: 17  16   Temp: 98.1 F (36.7 C)   98.4 F (36.9 C) (!) 97.5 F (36.4 C)  TempSrc: Oral  Oral Oral  SpO2: 97% 96% 96% 99%  Weight:  72.6 kg    Height:  5' (1.524 m)     Wt Readings from Last 3 Encounters:  11/18/23 72.6 kg  08/29/23 71 kg  08/10/23 71.2 kg   Body mass index is 31.25 kg/m.  General exam: Pleasant middle-aged African-American female. Skin: No rashes, lesions or ulcers. HEENT: Atraumatic, normocephalic, no obvious bleeding Lungs: Clear to auscultation bilaterally CVS: Regular rate and rhythm, no murmur GI/Abd moderate to severe epigastric pain even on gentle touch. CNS: Alert, awake, oriented x 3, 3/5 weakness in right arm and hip.  Weak right hand grip Psychiatry: Sad affect Extremities: No pedal edema, no calf tenderness   ------------------------------------------------------------------------------------------------------ Assessment/Plan: Principal Problem:   Right sided weakness  Suspect acute stroke Presented with right-sided headache and right extremity weakness for few days Out of tPA window CT scan of head was unremarkable.  MRI brain pending. If positive for stroke, will do full stroke workup If MRI brain is negative, may need to consider complicated migraine. Will get neurology consult Passed swallowing screening.  Carb modified diet ordered  Epigastric/right upper quadrant tenderness LFTs, lipase level normal CT abdomen pelvis showed Short segment of very mildly thickened proximal duodenum which may represent mild duodenitis.  Unclear significance.  Pain seems to be disproportionate to the finding. Last bowel movement yesterday. Obtain RUQ ultrasound  Type 2 diabetes mellitus with hyperglycemia A1c 6.9 in July last year.  Update A1c PTA meds-not on any.  He used to be on Trulicity in the past but unable to afford after insurance dropped it Blood sugar level this morning elevated to 371.  Labs from last night with normal bicarb level, Start SSI/Accu-Cheks.  Add Semglee 5  units this morning Recent Labs  Lab 11/18/23 1004 11/18/23 1147  GLUCAP 371* 316*   SLE She states she takes prednisone if it flares up.  Last use of prednisone was a month ago.  H/o seizures not on meds  Home med list has not been obtained/updated by [facility] at the time of admission.  Please double check admission reconciliation.   Mobility: Encourage ambulation.  PT eval  Goals of care   Code Status: Full Code    DVT prophylaxis:  enoxaparin (LOVENOX) injection 40 mg Start: 11/18/23 1115   Antimicrobials: None Fluid: None Consultants: Neurology Family Communication: Daughter at bedside  Dispo: The patient is from: Home              Anticipated d/c is to: Home, pending clinical course  Diet: Diet Order             Diet Carb Modified Fluid consistency: Thin; Room service appropriate? Yes  Diet effective now                    ------------------------------------------------------------------------------------- Severity of Illness: The appropriate patient  status for this patient is OBSERVATION. Observation status is judged to be reasonable and necessary in order to provide the required intensity of service to ensure the patient's safety. The patient's presenting symptoms, physical exam findings, and initial radiographic and laboratory data in the context of their medical condition is felt to place them at decreased risk for further clinical deterioration. Furthermore, it is anticipated that the patient will be medically stable for discharge from the hospital within 2 midnights of admission.  -------------------------------------------------------------------------------------  Home Meds: Prior to Admission medications   Medication Sig Start Date End Date Taking? Authorizing Provider  acetaminophen (TYLENOL) 500 MG tablet Take 500 mg by mouth every 6 (six) hours as needed for moderate pain.    [provider]  blood glucose meter kit and supplies KIT  Dispense based on patient and insurance preference. Use up to four times daily as directed. (FOR ICD-9 250.00, 250.01). 03/11/18   Marguerita Merles Latif, DO  cholecalciferol (VITAMIN D) 1000 units tablet Take 1,000 Units by mouth daily.    [provider]  Dulaglutide (TRULICITY) 0.75 MG/0.5ML SOPN Inject 0.75 mg into the skin once a week. 01/18/23   Hoy Register, MD  EPINEPHrine (EPIPEN 2-PAK) 0.3 mg/0.3 mL IJ SOAJ injection Inject 0.3 mLs (0.3 mg total) into the muscle once as needed (for severe allergic reaction). Patient taking differently: Inject 0.3 mg into the muscle once as needed for anaphylaxis. 06/28/20   Molpus, John, MD  famotidine (PEPCID) 20 MG tablet Take 1 tablet (20 mg total) by mouth 2 (two) times daily. 06/05/22   Gerhard Munch, MD  famotidine (PEPCID) 40 MG tablet 1 hour prior to CT 01/13/22   Vanna Scotland, MD  gabapentin (NEURONTIN) 300 MG capsule Take 1-2 capsules (300-600 mg total) by mouth 3 (three) times daily. Patient not taking: Reported on 02/03/2015 02/20/14 06/08/24  Constant, Peggy, MD  gabapentin (NEURONTIN) 300 MG capsule Take 1 capsule (300 mg total) by mouth 3 (three) times daily as needed. 06/09/23   Long, Arlyss Repress, MD  HYDROcodone-acetaminophen (NORCO/VICODIN) 5-325 MG tablet Take 1 tablet by mouth every 6 (six) hours as needed for severe pain. 12/17/22   Zadie Rhine, MD  lisinopril (ZESTRIL) 10 MG tablet Take 1 tablet (10 mg total) by mouth daily. 01/05/22   Grayce Sessions, NP  methocarbamol (ROBAXIN) 500 MG tablet Take 1 tablet (500 mg total) by mouth 2 (two) times daily. 12/17/22   Zadie Rhine, MD  ondansetron (ZOFRAN-ODT) 4 MG disintegrating tablet Take 1 tablet (4 mg total) by mouth every 8 (eight) hours as needed for nausea or vomiting. 05/03/22   Pricilla Loveless, MD  OVER THE COUNTER MEDICATION Take 2 tablets by mouth daily. Amberen    [provider]  oxyCODONE-acetaminophen (PERCOCET/ROXICET) 5-325 MG tablet Take 1 tablet by mouth  every 6 (six) hours as needed for severe pain. 06/09/23   Long, Arlyss Repress, MD  senna-docusate (SENOKOT-S) 8.6-50 MG tablet Take 1 tablet by mouth at bedtime as needed for mild constipation. 06/09/23   Long, Arlyss Repress, MD  sucralfate (CARAFATE) 1 g tablet Take 1 tablet (1 g total) by mouth 4 (four) times daily -  with meals and at bedtime. 06/05/22   Gerhard Munch, MD  traMADol (ULTRAM) 50 MG tablet Take 1 tablet (50 mg total) by mouth every 6 (six) hours as needed. 08/10/23   Geoffery Lyons, MD  vitamin B-12 (CYANOCOBALAMIN) 1000 MCG tablet Take 1,000 mcg by mouth daily.    [provider]  dicyclomine (BENTYL)  20 MG tablet Take 1 tablet (20 mg total) by mouth 2 (two) times daily as needed for spasms. 02/20/19 06/28/20  Fawze, Mina A, PA-C  ipratropium-albuterol (DUONEB) 0.5-2.5 (3) MG/3ML SOLN Take 3 mLs by nebulization every 4 (four) hours as needed. Patient not taking: Reported on 09/22/2018 03/11/18 06/07/19  Marguerita Merles Latif, DO  omeprazole (PRILOSEC) 20 MG capsule Take 1 capsule (20 mg total) by mouth 2 (two) times daily before a meal. 06/07/19 06/28/20  Gilda Crease, MD    Labs on Admission:   CBC: Recent Labs  Lab 11/17/23 2255  WBC 7.3  HGB 12.9  HCT 40.5  MCV 83.9  PLT 317    Basic Metabolic Panel: Recent Labs  Lab 11/18/23 0023  NA 138  K 3.8  CL 101  CO2 28  GLUCOSE 180*  BUN 18  CREATININE 0.86  CALCIUM 10.4*    Liver Function Tests: Recent Labs  Lab 11/18/23 0023  AST 19  ALT 18  ALKPHOS 96  BILITOT 0.4  PROT 8.2*  ALBUMIN 4.7   Recent Labs  Lab 11/18/23 0023  LIPASE 25   No results for input(s): "AMMONIA" in the last 168 hours.  Cardiac Enzymes: No results for input(s): "CKTOTAL", "CKMB", "CKMBINDEX", "TROPONINI" in the last 168 hours.  BNP (last 3 results) No results for input(s): "BNP" in the last 8760 hours.  ProBNP (last 3 results) No results for input(s): "PROBNP" in the last 8760 hours.  CBG: Recent Labs  Lab  11/18/23 1004 11/18/23 1147  GLUCAP 371* 316*    Lipase     Component Value Date/Time   LIPASE 25 11/18/2023 0023     Urinalysis    Component Value Date/Time   COLORURINE YELLOW 06/09/2023 1803   APPEARANCEUR CLEAR 06/09/2023 1803   APPEARANCEUR Hazy (A) 01/13/2022 1155   LABSPEC 1.023 06/09/2023 1803   PHURINE 6.5 06/09/2023 1803   GLUCOSEU 500 (A) 06/09/2023 1803   HGBUR NEGATIVE 06/09/2023 1803   BILIRUBINUR NEGATIVE 06/09/2023 1803   BILIRUBINUR Negative 01/13/2022 1155   KETONESUR NEGATIVE 06/09/2023 1803   PROTEINUR TRACE (A) 06/09/2023 1803   UROBILINOGEN 0.2 04/14/2015 0300   NITRITE NEGATIVE 06/09/2023 1803   LEUKOCYTESUR SMALL (A) 06/09/2023 1803     Drugs of Abuse     Component Value Date/Time   LABOPIA NONE DETECTED 06/08/2022 1545   COCAINSCRNUR NONE DETECTED 06/08/2022 1545   LABBENZ NONE DETECTED 06/08/2022 1545   AMPHETMU NONE DETECTED 06/08/2022 1545   THCU NONE DETECTED 06/08/2022 1545   LABBARB NONE DETECTED 06/08/2022 1545      Radiological Exams on Admission: CT ABDOMEN PELVIS WO CONTRAST  Result Date: 11/18/2023 CLINICAL DATA:  Right upper quadrant pain. EXAM: CT ABDOMEN AND PELVIS WITHOUT CONTRAST TECHNIQUE: Multidetector CT imaging of the abdomen and pelvis was performed following the standard protocol without IV contrast. RADIATION DOSE REDUCTION: This exam was performed according to the departmental dose-optimization program which includes automated exposure control, adjustment of the mA and/or kV according to patient size and/or use of iterative reconstruction technique. COMPARISON:  June 09, 2023 FINDINGS: Lower chest: No acute abnormality. Hepatobiliary: No focal liver abnormality is seen. No gallstones, gallbladder wall thickening, or biliary dilatation. Pancreas: Unremarkable. No pancreatic ductal dilatation or surrounding inflammatory changes. Spleen: Normal in size without focal abnormality. Adrenals/Urinary Tract: Adrenal glands are  unremarkable. Kidneys are normal in size, without renal calculi or hydronephrosis. A stable 5.7 cm diameter cyst is seen within the mid left kidney. Bladder is unremarkable. Stomach/Bowel: Stomach is  within normal limits. Appendix appears normal. No evidence of bowel dilatation. A short segment of very mildly thickened proximal duodenum is noted (axial CT images 30 through 37, CT series 2). Vascular/Lymphatic: No significant vascular findings are present. No enlarged abdominal or pelvic lymph nodes. Reproductive: Status post hysterectomy. No adnexal masses. Other: No abdominal wall hernia or abnormality. No abdominopelvic ascites. Musculoskeletal: No acute or significant osseous findings. IMPRESSION: 1. Short segment of very mildly thickened proximal duodenum which may represent mild duodenitis. 2. Stable left renal cyst. No follow-up imaging is recommended. This recommendation follows ACR consensus guidelines: Management of the Incidental Renal Mass on CT: A White Paper of the ACR Incidental Findings Committee. J Am Coll Radiol [phone]. Electronically Signed   By: Aram Candela M.D.   On: 11/18/2023 01:58   CT Head Wo Contrast  Result Date: 11/17/2023 CLINICAL DATA:  Headache, neuro deficit EXAM: CT HEAD WITHOUT CONTRAST TECHNIQUE: Contiguous axial images were obtained from the base of the skull through the vertex without intravenous contrast. RADIATION DOSE REDUCTION: This exam was performed according to the departmental dose-optimization program which includes automated exposure control, adjustment of the mA and/or kV according to patient size and/or use of iterative reconstruction technique. COMPARISON:  CT head 08/21/2020 FINDINGS: Brain: No evidence of large-territorial acute infarction. No parenchymal hemorrhage. No mass lesion. No extra-axial collection. No mass effect or midline shift. No hydrocephalus. Basilar cisterns are patent. Vascular: No hyperdense vessel. Skull: No acute fracture or  focal lesion. Sinuses/Orbits: Paranasal sinuses and mastoid air cells are clear. The orbits are unremarkable. Other: None. IMPRESSION: No acute intracranial abnormality. Electronically Signed   By: Tish Frederickson M.D.   On: 11/17/2023 23:48     Signed, Lorin Glass, MD [facility] 11/18/2023

## 2023-11-18 NOTE — ED Notes (Addendum)
Report given to 3W nurse via chat. Discussed that admission orders would be ordered once patient is admitted. Pain medication given prior to transport. Pt a/ox4, VSS, and daughter updated at bedside.

## 2023-11-18 NOTE — Progress Notes (Signed)
Report received from [facility] ED, per ED RN no NIHSS or YALE performed as no orders were placed. Per Dr. Orest Dikes note "Concern for stroke versus complex migraine" Patient admitted to floor with decreased sensation and weakness in RUE, RLE as well as decreased sensation and tingling in R side of face. NIHSS of 5.

## 2023-11-18 NOTE — Plan of Care (Signed)
  Problem: Education: Goal: Knowledge of General Education information will improve Description: Including pain rating scale, medication(s)/side effects and non-pharmacologic comfort measures Outcome: Progressing   Problem: Health Behavior/Discharge Planning: Goal: Ability to manage health-related needs will improve Outcome: Progressing   Problem: Clinical Measurements: Goal: Ability to maintain clinical measurements within normal limits will improve Outcome: Progressing Goal: Will remain free from infection Outcome: Progressing Goal: Diagnostic test results will improve Outcome: Progressing Goal: Respiratory complications will improve Outcome: Progressing Goal: Cardiovascular complication will be avoided Outcome: Progressing   Problem: Activity: Goal: Risk for activity intolerance will decrease Outcome: Progressing   Problem: Nutrition: Goal: Adequate nutrition will be maintained Outcome: Progressing   Problem: Coping: Goal: Level of anxiety will decrease Outcome: Progressing   Problem: Elimination: Goal: Will not experience complications related to bowel motility Outcome: Progressing Goal: Will not experience complications related to urinary retention Outcome: Progressing   Problem: Pain Management: Goal: General experience of comfort will improve Outcome: Progressing   Problem: Safety: Goal: Ability to remain free from injury will improve Outcome: Progressing   Problem: Skin Integrity: Goal: Risk for impaired skin integrity will decrease Outcome: Progressing   Problem: Education: Goal: Ability to describe self-care measures that may prevent or decrease complications (Diabetes Survival Skills Education) will improve Outcome: Progressing Goal: Individualized Educational Video(s) Outcome: Progressing   Problem: Coping: Goal: Ability to adjust to condition or change in health will improve Outcome: Progressing   Problem: Fluid Volume: Goal: Ability to  maintain a balanced intake and output will improve Outcome: Progressing   Problem: Health Behavior/Discharge Planning: Goal: Ability to identify and utilize available resources and services will improve Outcome: Progressing Goal: Ability to manage health-related needs will improve Outcome: Progressing   Problem: Metabolic: Goal: Ability to maintain appropriate glucose levels will improve Outcome: Progressing   Problem: Nutritional: Goal: Maintenance of adequate nutrition will improve Outcome: Progressing Goal: Progress toward achieving an optimal weight will improve Outcome: Progressing   Problem: Skin Integrity: Goal: Risk for impaired skin integrity will decrease Outcome: Progressing   Problem: Tissue Perfusion: Goal: Adequacy of tissue perfusion will improve Outcome: Progressing   Problem: Education: Goal: Knowledge of disease or condition will improve Outcome: Progressing Goal: Knowledge of secondary prevention will improve (MUST DOCUMENT ALL) Outcome: Progressing Goal: Knowledge of patient specific risk factors will improve Loraine Leriche N/A or DELETE if not current risk factor) Outcome: Progressing   Problem: Ischemic Stroke/TIA Tissue Perfusion: Goal: Complications of ischemic stroke/TIA will be minimized Outcome: Progressing   Problem: Coping: Goal: Will verbalize positive feelings about self Outcome: Progressing Goal: Will identify appropriate support needs Outcome: Progressing   Problem: Health Behavior/Discharge Planning: Goal: Ability to manage health-related needs will improve Outcome: Progressing Goal: Goals will be collaboratively established with patient/family Outcome: Progressing   Problem: Self-Care: Goal: Ability to participate in self-care as condition permits will improve Outcome: Progressing Goal: Verbalization of feelings and concerns over difficulty with self-care will improve Outcome: Progressing Goal: Ability to communicate needs accurately  will improve Outcome: Progressing   Problem: Nutrition: Goal: Risk of aspiration will decrease Outcome: Progressing Goal: Dietary intake will improve Outcome: Progressing   Problem: Education: Goal: Knowledge of secondary prevention will improve (MUST DOCUMENT ALL) Outcome: Progressing Goal: Knowledge of patient specific risk factors will improve Loraine Leriche N/A or DELETE if not current risk factor) Outcome: Progressing

## 2023-11-19 ENCOUNTER — Other Ambulatory Visit (HOSPITAL_COMMUNITY): Payer: Self-pay

## 2023-11-19 DIAGNOSIS — R531 Weakness: Secondary | ICD-10-CM | POA: Diagnosis not present

## 2023-11-19 DIAGNOSIS — M5481 Occipital neuralgia: Secondary | ICD-10-CM | POA: Diagnosis not present

## 2023-11-19 DIAGNOSIS — R1013 Epigastric pain: Secondary | ICD-10-CM | POA: Diagnosis not present

## 2023-11-19 LAB — BASIC METABOLIC PANEL
Anion gap: 10 (ref 5–15)
BUN: 19 mg/dL (ref 6–20)
CO2: 24 mmol/L (ref 22–32)
Calcium: 9.6 mg/dL (ref 8.9–10.3)
Chloride: 101 mmol/L (ref 98–111)
Creatinine, Ser: 0.91 mg/dL (ref 0.44–1.00)
GFR, Estimated: >60 mL/min (ref >60)
Glucose, Bld: 252 mg/dL — ABNORMAL HIGH (ref 70–99)
Potassium: 3.5 mmol/L (ref 3.5–5.1)
Sodium: 135 mmol/L (ref 135–145)

## 2023-11-19 LAB — GLUCOSE, CAPILLARY
Glucose-Capillary: 201 mg/dL — ABNORMAL HIGH (ref 70–99)
Glucose-Capillary: 124 mg/dL — ABNORMAL HIGH (ref 70–99)
Glucose-Capillary: 116 mg/dL — ABNORMAL HIGH (ref 70–99)
Glucose-Capillary: 143 mg/dL — ABNORMAL HIGH (ref 70–99)

## 2023-11-19 LAB — LIPID PANEL
Cholesterol: 169 mg/dL (ref 0–200)
HDL: 65 mg/dL (ref >40)
LDL Cholesterol: 92 mg/dL (ref 0–99)
Total CHOL/HDL Ratio: 2.6 {ratio}
Triglycerides: 59 mg/dL (ref <150)
VLDL: 12 mg/dL (ref 0–40)

## 2023-11-19 LAB — CBC
HCT: 37.6 % (ref 36.0–46.0)
Hemoglobin: 12 g/dL (ref 12.0–15.0)
MCH: 26.4 pg (ref 26.0–34.0)
MCHC: 31.9 g/dL (ref 30.0–36.0)
MCV: 82.6 fL (ref 80.0–100.0)
Platelets: 349 10*3/uL (ref 150–400)
RBC: 4.55 MIL/uL (ref 3.87–5.11)
RDW: 13.3 % (ref 11.5–15.5)
WBC: 13.1 10*3/uL — ABNORMAL HIGH (ref 4.0–10.5)
nRBC: 0 % (ref 0.0–0.2)

## 2023-11-19 LAB — HEMOGLOBIN A1C
Hgb A1c MFr Bld: 9.6 % — ABNORMAL HIGH (ref 4.8–5.6)
Mean Plasma Glucose: 228.82 mg/dL

## 2023-11-19 MED ORDER — LIVING WELL WITH DIABETES BOOK
Freq: Once | Status: AC
  Filled 2023-11-19: qty 1

## 2023-11-19 MED ORDER — HYDROCODONE-ACETAMINOPHEN 5-325 MG PO TABS
1.0000 | ORAL_TABLET | ORAL | Status: DC | PRN
  Administered 2023-11-21 (×2): 2 via ORAL
  Filled 2023-11-19 (×3): qty 2

## 2023-11-19 MED ORDER — METHOCARBAMOL 750 MG PO TABS
750.0000 mg | ORAL_TABLET | Freq: Four times a day (QID) | ORAL | Status: DC | PRN
  Administered 2023-11-19 – 2023-11-21 (×2): 750 mg via ORAL
  Filled 2023-11-19 (×3): qty 1

## 2023-11-19 MED ORDER — TRIAMCINOLONE ACETONIDE 40 MG/ML IJ SUSP
Freq: Once | INTRAMUSCULAR | Status: AC
  Filled 2023-11-19: qty 10

## 2023-11-19 MED ORDER — HYDROMORPHONE HCL 1 MG/ML IJ SOLN
1.0000 mg | INTRAMUSCULAR | Status: AC | PRN
  Administered 2023-11-19 – 2023-11-20 (×5): 1 mg via INTRAVENOUS
  Filled 2023-11-19 (×5): qty 1

## 2023-11-19 MED ORDER — HYDROMORPHONE HCL 1 MG/ML IJ SOLN: 1.0000 mg | INTRAMUSCULAR | Status: DC | PRN

## 2023-11-19 MED ORDER — LIDOCAINE 4 % EX CREA
TOPICAL_CREAM | Freq: Three times a day (TID) | CUTANEOUS | Status: DC | PRN
  Filled 2023-11-19: qty 5

## 2023-11-19 NOTE — Congregational Nurse Program (Deleted)
  Patient's nurse reported that: good evening, so pt is upset, in pain, and questioning why IV dilaudid was dc'd. pt has some GI issues N/V, abd pain, and hasn't been able to eat or drink much today.   -I have ordered IV Dilaudid every 4 hour as needed for severe pain-ordered for 1 day as per patient request and satisfaction as well as patient is not able to tolerate any oral medication.  Tereasa Coop, MD [facility] 11/19/2023, 8:26 PM

## 2023-11-19 NOTE — Discharge Summary (Incomplete)
Physician Discharge Summary   Patient: Donna [name] MRN: [id number] DOB: July 23, 1971  Admit date:     11/17/2023  Discharge date: {dischdate:26783}  Discharge Physician: Jacquelin Hawking   PCP: Fleet Contras, MD   Recommendations at discharge:  {Tip this will not be part of the note when signed- Example include specific recommendations for outpatient follow-up, pending tests to follow-up on. (Optional):26781}  ***  Discharge Diagnoses: Principal Problem:   Right sided weakness  Resolved Problems:   * No resolved hospital problems. [facility] Course: No notes on file  Assessment and Plan: No notes have been filed under this hospital service. Service: Hospitalist     {Tip this will not be part of the note when signed Body mass index is 31.25 kg/m. , ,  (Optional):26781}  {(NOTE) Pain control PDMP Statment (Optional):26782} Consultants: *** Procedures performed: ***  Disposition: {Plan; Disposition:26390} Diet recommendation:  {Diet_Plan:26776} DISCHARGE MEDICATION: Allergies as of 11/19/2023       Reactions   Bee Venom Anaphylaxis   Diamox [acetazolamide] Anaphylaxis   Nsaids Anaphylaxis   Omnipaque [iohexol] Anaphylaxis   Tolmetin Anaphylaxis   Compazine [prochlorperazine] Hives   Iopamidol Hives   Lactose Intolerance (gi) Nausea And Vomiting   Reglan [metoclopramide] Hives     Med Rec must be completed prior to using this [facility]***        Durable Medical Equipment  (From admission, onward)           Start     Ordered   11/19/23 1154  For home use only DME Walker rolling  Once       Question Answer Comment  Walker: With 5 Inch Wheels   Patient needs a walker to treat with the following condition Weakness      11/19/23 1153            Follow-up Information     [facility]. Schedule an appointment as soon as possible for a visit.   Specialty: Rehabilitation Contact information: [address] [place] [address] [phone]        [facility]. Call.   Why: Psychiatry and therapy services  Please have the patient or guardian call us at [phone] between 8am - 3pm Monday - Friday to complete initial registration and assessment virtuall through our open access process. The patient can also walk-in to our nearest behavioral health outpatient office if preferred. If walking in, note that our offices are closed for lunch from 12-1pm. Please share the following with the patient when instructing them to call/walk-in to [facility]  Have ID and insurance card available Must be physically located in [place] when receiving virtual services If the patient has a legal guardian (parent or court-appointed), the guardian must be present to sign consents to treat Contact information: [address]  [address] [place] [address] [phone]                Discharge Exam: [name] Weights   11/18/23 0900  Weight: 72.6 kg   ***  Condition at discharge: {DC Condition:26389}  The results of significant diagnostics from this hospitalization (including imaging, microbiology, ancillary and laboratory) are listed below for reference.   Imaging Studies: MR BRAIN WO CONTRAST  Result Date: 11/19/2023 CLINICAL DATA:  Acute neurologic deficit. Headache and right-sided weakness. EXAM: MRI HEAD WITHOUT CONTRAST TECHNIQUE: Multiplanar, multiecho pulse sequences of the brain and surrounding structures were obtained without intravenous contrast. COMPARISON:  None Available. FINDINGS: Brain: No acute infarct,  mass effect or extra-axial collection. No chronic microhemorrhage or siderosis. Normal white matter signal, parenchymal volume and CSF spaces. The midline structures are normal. Vascular: Normal flow voids. Skull and upper cervical spine: Normal marrow signal. Sinuses/Orbits: Negative. Other: None. IMPRESSION: Normal brain MRI. Electronically Signed   By: Deatra Robinson M.D.   On: 11/19/2023 02:43   US Abdomen Limited RUQ (LIVER/GB)  Result Date: 11/18/2023 CLINICAL DATA:  Right upper quadrant abdominal tenderness. EXAM: ULTRASOUND ABDOMEN LIMITED RIGHT UPPER QUADRANT COMPARISON:  None Available. FINDINGS: Gallbladder: No gallstones or wall thickening visualized. No sonographic Murphy sign noted by sonographer. Common bile duct: Diameter: 2.5 mm Liver: No focal lesion identified. Within normal limits in parenchymal echogenicity. Portal vein is patent on color Doppler imaging with normal direction of blood flow towards the liver. Other: None. IMPRESSION: Normal right upper quadrant ultrasound. Electronically Signed   By: Signa Kell M.D.   On: 11/18/2023 20:26   CT ABDOMEN PELVIS WO CONTRAST  Result Date: 11/18/2023 CLINICAL DATA:  Right upper quadrant pain. EXAM: CT ABDOMEN AND PELVIS WITHOUT CONTRAST TECHNIQUE: Multidetector CT imaging of the abdomen and pelvis was performed following the standard protocol without IV contrast. RADIATION DOSE REDUCTION: This exam was performed according to the departmental dose-optimization program which includes automated exposure control, adjustment of the mA and/or kV according to patient size and/or use of iterative reconstruction technique. COMPARISON:  June 09, 2023 FINDINGS: Lower chest: No acute abnormality. Hepatobiliary: No focal liver abnormality is seen. No gallstones, gallbladder wall thickening, or biliary dilatation. Pancreas: Unremarkable. No pancreatic ductal dilatation or surrounding inflammatory changes. Spleen: Normal in size without focal abnormality. Adrenals/Urinary Tract: Adrenal glands are unremarkable. Kidneys are normal in size, without renal calculi or hydronephrosis. A stable 5.7 cm diameter cyst is seen within the mid left kidney. Bladder is unremarkable. Stomach/Bowel: Stomach is within normal limits. Appendix appears normal. No evidence of bowel dilatation. A short segment of very mildly thickened  proximal duodenum is noted (axial CT images 30 through 37, CT series 2). Vascular/Lymphatic: No significant vascular findings are present. No enlarged abdominal or pelvic lymph nodes. Reproductive: Status post hysterectomy. No adnexal masses. Other: No abdominal wall hernia or abnormality. No abdominopelvic ascites. Musculoskeletal: No acute or significant osseous findings. IMPRESSION: 1. Short segment of very mildly thickened proximal duodenum which may represent mild duodenitis. 2. Stable left renal cyst. No follow-up imaging is recommended. This recommendation follows ACR consensus guidelines: Management of the Incidental Renal Mass on CT: A White Paper of the ACR Incidental Findings Committee. J Am Coll Radiol [phone]. Electronically Signed   By: Aram Candela M.D.   On: 11/18/2023 01:58   CT Head Wo Contrast  Result Date: 11/17/2023 CLINICAL DATA:  Headache, neuro deficit EXAM: CT HEAD WITHOUT CONTRAST TECHNIQUE: Contiguous axial images were obtained from the base of the skull through the vertex without intravenous contrast. RADIATION DOSE REDUCTION: This exam was performed according to the departmental dose-optimization program which includes automated exposure control, adjustment of the mA and/or kV according to patient size and/or use of iterative reconstruction technique. COMPARISON:  CT head 08/21/2020 FINDINGS: Brain: No evidence of large-territorial acute infarction. No parenchymal hemorrhage. No mass lesion. No extra-axial collection. No mass effect or midline shift. No hydrocephalus. Basilar cisterns are patent. Vascular: No hyperdense vessel. Skull: No acute fracture or focal lesion. Sinuses/Orbits: Paranasal sinuses and mastoid air cells are clear. The orbits are unremarkable. Other: None. IMPRESSION: No acute intracranial abnormality. Electronically Signed   By: Normajean Glasgow.D.  On: 11/17/2023 23:48    Microbiology: Results for orders placed or performed during the hospital  encounter of 05/03/22  Resp Panel by RT-PCR (Flu A&B, Covid) Nasopharyngeal Swab     Status: None   Collection Time: 05/03/22  1:01 PM   Specimen: Nasopharyngeal Swab; Nasopharyngeal(NP) swabs in vial transport medium  Result Value Ref Range Status   SARS Coronavirus 2 by RT PCR NEGATIVE NEGATIVE Final    Comment: (NOTE) SARS-CoV-2 target nucleic acids are NOT DETECTED.  The SARS-CoV-2 RNA is generally detectable in upper respiratory specimens during the acute phase of infection. The lowest concentration of SARS-CoV-2 viral copies this assay can detect is 138 copies/mL. A negative result does not preclude SARS-Cov-2 infection and should not be used as the sole basis for treatment or other patient management decisions. A negative result may occur with  improper specimen collection/handling, submission of specimen other than nasopharyngeal swab, presence of viral mutation(s) within the areas targeted by this assay, and inadequate number of viral copies(<138 copies/mL). A negative result must be combined with clinical observations, patient history, and epidemiological information. The expected result is Negative.  Fact Sheet for Patients:  BloggerCourse.com  Fact Sheet for Healthcare Providers:  SeriousBroker.it  This test is no t yet approved or cleared by the [place] FDA and  has been authorized for detection and/or diagnosis of SARS-CoV-2 by FDA under an Emergency Use Authorization (EUA). This EUA will remain  in effect (meaning this test can be used) for the duration of the COVID-19 declaration under Section 564(b)(1) of the Act, 21 U.S.C.section 360bbb-3(b)(1), unless the authorization is terminated  or revoked sooner.       Influenza A by PCR NEGATIVE NEGATIVE Final   Influenza B by PCR NEGATIVE NEGATIVE Final    Comment: (NOTE) The Xpert Xpress SARS-CoV-2/FLU/RSV plus assay is intended as an aid in the diagnosis of  influenza from Nasopharyngeal swab specimens and should not be used as a sole basis for treatment. Nasal washings and aspirates are unacceptable for Xpert Xpress SARS-CoV-2/FLU/RSV testing.  Fact Sheet for Patients: BloggerCourse.com  Fact Sheet for Healthcare Providers: SeriousBroker.it  This test is not yet approved or cleared by the [place] FDA and has been authorized for detection and/or diagnosis of SARS-CoV-2 by FDA under an Emergency Use Authorization (EUA). This EUA will remain in effect (meaning this test can be used) for the duration of the COVID-19 declaration under Section 564(b)(1) of the Act, 21 U.S.C. section 360bbb-3(b)(1), unless the authorization is terminated or revoked.  Performed at [facility], [address] [place], [place] [address]   Urine Culture     Status: Abnormal   Collection Time: 05/03/22  6:54 PM   Specimen: Urine, Clean Catch  Result Value Ref Range Status   Specimen Description   Final    URINE, CLEAN CATCH Performed at [facility], [address] [place], [place] [address]    Special Requests   Final    NONE Performed at [facility], [address] [place], [place] [address]    Culture (A)  Final    >=100,000 COLONIES/mL LACTOBACILLUS SPECIES Standardized susceptibility testing for this organism is not available. Performed at [facility], [address] [place], [place] [address]    Report Status 05/05/2022 FINAL  Final    Labs: CBC: Recent Labs  Lab 11/17/23 2255 11/19/23 0449  WBC 7.3 13.1*  HGB 12.9 12.0  HCT 40.5 37.6  MCV 83.9 82.6  PLT 317  349   Basic Metabolic Panel: Recent Labs  Lab 11/18/23 0023 11/19/23 0449  NA 138 135  K 3.8 3.5  CL 101 101  CO2 28 24  GLUCOSE 180* 252*  BUN 18 19  CREATININE 0.86 0.91  CALCIUM 10.4* 9.6   Liver Function Tests: Recent Labs  Lab  11/18/23 0023  AST 19  ALT 18  ALKPHOS 96  BILITOT 0.4  PROT 8.2*  ALBUMIN 4.7   CBG: Recent Labs  Lab 11/18/23 1147 11/18/23 1743 11/18/23 2112 11/19/23 0616 11/19/23 1248  GLUCAP 316* 253* 171* 201* 124*    Discharge time spent: {LESS THAN/GREATER THAN:26388} 30 minutes.  Signed: Jacquelin Hawking, MD [facility] 11/19/2023

## 2023-11-19 NOTE — Inpatient Diabetes Management (Addendum)
Inpatient Diabetes Program Recommendations  AACE/ADA: New Consensus Statement on Inpatient Glycemic Control (2015)  Target Ranges:  Prepandial:   less than 140 mg/dL      Peak postprandial:   less than 180 mg/dL (1-2 hours)      Critically ill patients:  140 - 180 mg/dL   Lab Results  Component Value Date   GLUCAP 201 (H) 11/19/2023   HGBA1C 9.6 (H) 11/19/2023    Review of Glycemic Control  Latest Reference Range & Units 11/18/23 11:47 11/18/23 17:43 11/18/23 21:12 11/19/23 06:16  Glucose-Capillary 70 - 99 mg/dL 161 (H) 096 (H) 045 (H) 201 (H)  (H): Data is abnormally high  Diabetes history: DM2 Outpatient Diabetes medications: Trulicity 0.75 mg weekly Current orders for Inpatient glycemic control: Semglee 5 units at bedtime, Novolog 0-15 units TID and 0-5 units QHS  Inpatient Diabetes Program Recommendations:    Semglee 10 units at bedtime  Spoke with patient at bedside.  She just received pain medication.  Spoke with her briefly.  She used to take Trulicity but it was too expensive.  She has not been taking any medications for DM.  Reviewed current A1C with her.  She will need to discharge on something for glucose control.  Will ask for a benefit check on insulins.  Ordered the LWWD booklet.  Patient will need affordable insulin such as Novolin ReliOn 70/30 insulin or NPH insulin from [employer] if discharging home on insulin.   Will continue to follow while inpatient.  Thank you, Dulce Sellar, MSN, CDCES Diabetes Coordinator Inpatient Diabetes Program [phone] (team pager from 8a-5p)

## 2023-11-19 NOTE — Evaluation (Signed)
Occupational Therapy Evaluation Patient Details Name: Donna [name] MRN: [id number] DOB: 16-Feb-1971 Today's Date: 11/19/2023   History of Present Illness 52 y.o. female presented to [facility] ED with c/o persistent headache and right-sided weakness MRI at [facility] with no acute findings. PMH significant for DM2 not on meds, seizures not on meds, SLE, remote history of migraine.   Clinical Impression   PTA pt lives independently with her daughter and works as a Scientist, physiological. States her Mom died this year and it has been "a really stressful year". Pt with inconsistencies of movement throughout assessment as noted below.  Overall min A with ADL tasks. Recommend follow up with outpt OT. Family can assist as needed after DC per pt. Acute Ot to follow.       If plan is discharge home, recommend the following: A little help with walking and/or transfers;A little help with bathing/dressing/bathroom;Assistance with cooking/housework;Assist for transportation    Functional Status Assessment  Patient has had a recent decline in their functional status and demonstrates the ability to make significant improvements in function in a reasonable and predictable amount of time.  Equipment Recommendations  None recommended by OT    Recommendations for Other Services Other (comment) (psych consult)     Precautions / Restrictions Precautions Precautions: Fall      Mobility Bed Mobility Overal bed mobility: Needs Assistance Bed Mobility: Supine to Sit     Supine to sit: Supervision          Transfers Overall transfer level: Needs assistance   Transfers: Sit to/from Stand Sit to Stand: Contact guard assist                  Balance Overall balance assessment: Mild deficits observed, not formally tested                                         ADL either performed or assessed with clinical judgement   ADL Overall ADL's : Needs assistance/impaired Eating/Feeding:  Modified independent (poor po intake)   Grooming: Set up;Supervision/safety   Upper Body Bathing: Set up;Supervision/ safety;Sitting   Lower Body Bathing: Set up;Supervison/ safety;Sit to/from stand   Upper Body Dressing : Minimal assistance   Lower Body Dressing: Minimal assistance   Toilet Transfer: Minimal assistance           Functional mobility during ADLs: Minimal assistance General ADL Comments: using R hand inconsistently throughout tasks; unable to squeeze toothpaste and using mouth however able to squeeze water out of the washcloth     Vision Patient Visual Report: Diplopia;Blurring of vision Vision Assessment?: Yes Eye Alignment: Within Functional Limits Ocular Range of Motion: Within Functional Limits Alignment/Gaze Preference: Within Defined Limits Tracking/Visual Pursuits: Able to track stimulus in all quads without difficulty Saccades: Within functional limits Convergence: Within functional limits Visual Fields: No apparent deficits Diplopia Assessment: Other (comment) (diagonal; disappear when R eye closes; however complains of diplopia with R eye when L eye closed; no over/undershooting noted)     Perception Perception: Within Functional Limits       Praxis         Pertinent Vitals/Pain Pain Assessment Pain Assessment: Faces Faces Pain Scale: Hurts even more Pain Location: R upper trap Pain Descriptors / Indicators: Discomfort, Grimacing, Burning Pain Intervention(s): Limited activity within patient's tolerance, Repositioned, Heat applied     Extremity/Trunk Assessment Upper Extremity Assessment Upper Extremity Assessment: Right hand dominant;RUE  deficits/detail RUE Deficits / Details: inconsistently moving; not using RUE at all to assist with bed mobility then using it to squeeze out a washcloth; elbow/wirs/hand AROM overall WFL at times however strenght 3+/5; unable to move R shoulder beyond 20 degrees FF adn hiking shoulder when trying to  move RUE Sensation: WNL RUE Coordination: decreased fine motor;decreased gross motor   Lower Extremity Assessment Lower Extremity Assessment: Defer to PT evaluation   Cervical / Trunk Assessment Cervical / Trunk Assessment: Other exceptions (L bias)   Communication Communication Communication: No apparent difficulties   Cognition Arousal: Alert Behavior During Therapy: WFL for tasks assessed/performed Overall Cognitive Status: Within Functional Limits for tasks assessed                                 General Comments: +     General Comments       Exercises Exercises: Other exercises Other Exercises Other Exercises: red squeeze foam provided for grip and pinch strengthening   Shoulder Instructions      Home Living Family/patient expects to be discharged to:: Private residence Living Arrangements: Spouse/significant other;Children Available Help at Discharge: Family;Available 24 hours/day Type of Home: House Home Access: Stairs to enter [employer] of Steps: 3 Entrance Stairs-Rails: Can reach both Home Layout: One level     Bathroom Shower/Tub: IT trainer: Standard Bathroom Accessibility: Yes How Accessible: Accessible via walker Home Equipment: Rollator (4 wheels);Hand held shower head;BSC/3in1          Prior Functioning/Environment Prior Level of Function : Independent/Modified Independent             Mobility Comments: receptionsit          OT Problem List: Decreased strength;Decreased range of motion;Decreased activity tolerance;Impaired balance (sitting and/or standing);Decreased coordination;Impaired UE functional use      OT Treatment/Interventions: Self-care/ADL training;Therapeutic exercise;DME and/or AE instruction;Therapeutic activities;Patient/family education;Balance training    OT Goals(Current goals can be found in the care plan section) Acute Rehab OT Goals Patient Stated  Goal: to get better OT Goal Formulation: With patient Time For Goal Achievement: 12/03/23 Potential to Achieve Goals: Good  OT Frequency: Min 1X/week    Co-evaluation              AM-PAC OT "6 Clicks" Daily Activity     Outcome Measure Help from another person eating meals?: None Help from another person taking care of personal grooming?: A Little Help from another person toileting, which includes using toliet, bedpan, or urinal?: A Little Help from another person bathing (including washing, rinsing, drying)?: A Little Help from another person to put on and taking off regular upper body clothing?: A Little Help from another person to put on and taking off regular lower body clothing?: A Little 6 Click Score: 19   End of Session Equipment Utilized During Treatment: Gait belt Nurse Communication: Mobility status  Activity Tolerance: Patient tolerated treatment well Patient left: in bed;with call bell/phone within reach;with family/visitor present  OT Visit Diagnosis: Unsteadiness on feet (R26.81);Muscle weakness (generalized) (M62.81)                Time: 1205-1229 OT Time Calculation (min): 24 min Charges:  OT General Charges $OT Visit: 1 Visit OT Evaluation $OT Eval Moderate Complexity: 1 Mod OT Treatments $Self Care/Home Management : 8-22 mins  Luisa Dago, OT/L   Acute OT Clinical Specialist Acute Rehabilitation Services Pager [phone] Office [phone]  Waymon Laser,HILLARY 11/19/2023, 12:41 PM

## 2023-11-19 NOTE — Discharge Instructions (Addendum)
OUTPATIENT PSYCHIATRY AND THERAPY SERVICES  If you would like to refer someone for a mental health assessment, outpatient therapy or psychiatric medication management, or you are interested in these services for yourself, a referral form is NOT required.   Please have the patient or guardian call us at [phone] between 8am - 3pm Monday - Friday to complete initial registration and assessment virtuall through our open access process. The patient can also walk-in to our nearest behavioral health outpatient office if preferred. If walking in, note that our offices are closed for lunch from 12-1pm. Please share the following with the patient when instructing them to call/walk-in to [facility]  Have ID and insurance card available Must be physically located in [place] when receiving virtual services If the patient has a legal guardian (parent or court-appointed), the guardian must be present to sign consents to treat

## 2023-11-19 NOTE — Progress Notes (Signed)
Patient's nurse reported that: good evening, so pt is upset, in pain, and questioning why IV dilaudid was dc'd. pt has some GI issues N/V, abd pain, and hasn't been able to eat or drink much today.    -I have ordered IV Dilaudid every 4 hour as needed for severe pain-ordered for 1 day as per patient request and satisfaction as well as patient is not able to tolerate any oral medication.

## 2023-11-19 NOTE — Consult Note (Signed)
NEUROLOGY CONSULT NOTE   Date of service: November 19, 2023 Patient Name: Donna [name] MRN:  [id number] DOB:  1971-03-12 Chief Complaint: "R sided weakness" Requesting Provider: Narda Bonds, MD  History of Present Illness  Donna [name] is a 52 y.o. female with pertinent PMH of DVT, SLE, complex migraines, seizure disorder who presented with R-sided weakness that had progressed over several weeks. Work-up thus far has been negative for organic causes of her presenting symptoms.  Donna [name] explains that several weaks ago she was sick and that when her other URTI symptoms subsided, she continued to have severe headache and ongoing weakness. She noticed that with her RUE she was struggling with grip strength and opening jars. With her RLE she felt like her leg was dragging when walking. These feelings have been noted to improve with use of the limbs. Three days ago she became overwhelmed with the weakness prompting her to come in for further evaluation.  She has been diagnosed with complex migraine in the past but does not currently have any treatment for migraines. The difference between this episode and her prior complex migraine for which she was admitted for stroke rule-out was that her symptoms felt more sudden, where this episode has been lasted longer with some gradual progression. She does not have worsening of symptoms with lights or sounds.  Her headache starts in the temporal region on the R side of her head and radiates down into her neck. She states that fiorocet has not helped her pain thus far. Of note she does have allergy to NSAIDs (anaphylaxis), tolmetin (anaphylaxis). She does report significant tylenol use over the last ~2 weeks.   ROS  Comprehensive ROS performed and pertinent positives documented in HPI   Past History   Past Medical History: Past Medical History:  Diagnosis Date   DVT (deep venous thrombosis) (HCC)    right arm   Endometriosis    Lupus (systemic lupus  erythematosus) (HCC) 2007   Migraine    Seizures (HCC)    last sz 06/2013, no meds   Past Surgical History: Past Surgical History:  Procedure Laterality Date   ABDOMINAL HYSTERECTOMY     x2 partial   CESAREAN SECTION     x3   MASS EXCISION  02/2014   abd   Family History: Family History  Problem Relation Age of Onset   CVA Mother 62   Alzheimer's disease Father    Breast cancer Sister    Ovarian cancer Sister    Stomach cancer Sister    Breast cancer Maternal Aunt    Ovarian cancer Maternal Aunt    Stomach cancer Maternal Aunt    Birth defects Maternal Uncle    Colon cancer Neg Hx    Esophageal cancer Neg Hx    Pancreatic cancer Neg Hx    Social History  reports that she has never smoked. She has never used smokeless tobacco. She reports that she does not drink alcohol and does not use drugs.  Allergies: Allergies  Allergen Reactions   Bee Venom Anaphylaxis   Diamox [Acetazolamide] Anaphylaxis   Nsaids Anaphylaxis   Omnipaque [Iohexol] Anaphylaxis   Tolmetin Anaphylaxis   Compazine [Prochlorperazine] Hives   Iopamidol Hives   Lactose Intolerance (Gi) Nausea And Vomiting   Reglan [Metoclopramide] Hives    Medications   Current Facility-Administered Medications:    albuterol (PROVENTIL) (2.5 MG/3ML) 0.083% nebulizer solution 2.5 mg, 2.5 mg, Nebulization, Q6H PRN, Dahal, Binaya, MD   butalbital-acetaminophen-caffeine (FIORICET) 50-325-40 MG per tablet  1 tablet, 1 tablet, Oral, Q6H PRN, Dahal, Melina Schools, MD, 1 tablet at 11/19/23 1037   enoxaparin (LOVENOX) injection 40 mg, 40 mg, Subcutaneous, Q24H, Dahal, Binaya, MD, 40 mg at 11/19/23 1038   hydrALAZINE (APRESOLINE) injection 10 mg, 10 mg, Intravenous, Q6H PRN, Dahal, Binaya, MD   insulin aspart (novoLOG) injection 0-15 Units, 0-15 Units, Subcutaneous, TID WC, Dahal, Binaya, MD, 5 Units at 11/19/23 0647   insulin aspart (novoLOG) injection 0-5 Units, 0-5 Units, Subcutaneous, QHS, Dahal, Binaya, MD   insulin  glargine-yfgn (SEMGLEE) injection 5 Units, 5 Units, Subcutaneous, QHS, Dahal, Binaya, MD, 5 Units at 11/18/23 2215   living well with diabetes book MISC, , Does not apply, Once, Narda Bonds, MD   ondansetron [facility]) injection 4 mg, 4 mg, Intravenous, Q6H PRN, Dahal, Binaya, MD, 4 mg at 11/19/23 1038   pantoprazole (PROTONIX) EC tablet 40 mg, 40 mg, Oral, QAC breakfast, Dahal, Binaya, MD, 40 mg at 11/19/23 0647  Vitals   Vitals:   11/18/23 1955 11/18/23 2358 11/19/23 0344 11/19/23 0755  BP: 124/74 116/71 122/71 113/66  Pulse: 76 (!) 101 92 87  Resp:  18 18 17   Temp: 97.9 F (36.6 C) 98.5 F (36.9 C) 98 F (36.7 C) 98.1 F (36.7 C)  TempSrc: Oral Oral Oral Oral  SpO2: 96% 98% 96% 97%  Weight:      Height:        Body mass index is 31.25 kg/m.  Physical Exam   Constitutional:Awake, alert, resting comfortably in bed. In no acute distress. Normal work of breathing on room air. ZOX:WRUEAVWU for extremity edema. Tender to palpation over R temporal, occipital scalp and R trapezius muscle. However, occipital nerve area was EXQUISITELY tender   Skin:Warm and dry. Psych:Pleasant mood and affect.  Neurologic Examination   Mental Status: Patient is awake, alert, oriented x3. No signs of aphasia or neglect Cranial Nerves: Pupils equal, round, and reactive to light.   EOMI without ptosis or diploplia.  Facial sensation is symmetric to light touch and temperature. Facial movement is symmetric.  Hearing is intact to voice Shoulder shrug is weaker on R.  Motor: RUE and RLE are 4-/5 however this improves to 4+-5/5 with distraction. LUE and LLE 5/5. Full active range of motion of RUE noted with distraction however minimal use of RUE and RLE when focusing on these movements.   Distractible intermittent tremor of variable amplitude and frequency on the right side. RUE is held in a flexed position preferentially Sensory: Sensation is grossly intact and equal to bilateral UE &  LE.  Labs/Imaging/Neurodiagnostic studies   CBC:  Recent Labs  Lab 11-28-2023 2255 11/19/23 0449  WBC 7.3 13.1*  HGB 12.9 12.0  HCT 40.5 37.6  MCV 83.9 82.6  PLT 317 349   Basic Metabolic Panel:  Lab Results  Component Value Date   NA 135 11/19/2023   K 3.5 11/19/2023   CO2 24 11/19/2023   GLUCOSE 252 (H) 11/19/2023   BUN 19 11/19/2023   CREATININE 0.91 11/19/2023   CALCIUM 9.6 11/19/2023   GFRNONAA >60 11/19/2023   GFRAA >60 08/21/2020   Lipid Panel:  Lab Results  Component Value Date   LDLCALC 92 11/19/2023   HgbA1c:  Lab Results  Component Value Date   HGBA1C 9.6 (H) 11/19/2023   Urine Drug Screen:     Component Value Date/Time   LABOPIA NONE DETECTED 06/08/2022 1545   COCAINSCRNUR NONE DETECTED 06/08/2022 1545   LABBENZ NONE DETECTED 06/08/2022 1545   AMPHETMU  NONE DETECTED 06/08/2022 1545   THCU NONE DETECTED 06/08/2022 1545   LABBARB NONE DETECTED 06/08/2022 1545    Alcohol Level     Component Value Date/Time   ETH <5 02/03/2015 0305   INR  Lab Results  Component Value Date   INR 1.1 08/21/2020   APTT  Lab Results  Component Value Date   APTT 27 08/21/2020   AED levels: No results found for: "PHENYTOIN", "ZONISAMIDE", "LAMOTRIGINE", "LEVETIRACETA"  CT Head without contrast(Personally reviewed): No acute intracranial abnormality.  MRI Brain(Personally reviewed): Normal brain MRI.  ASSESSMENT   Donna [name] is a 52 y.o. female with pertinent PMH of DVT, SLE, complex migraines, seizure disorder who presented with R-sided weakness that had progressed over several weeks. Work-up thus far has been negative for organic causes of her presenting symptoms.  Suspect medication overuse headache, with component of occipital neuralgia, trigging functional limb weakness  RECOMMENDATIONS  -Outpatient neurology follow-up -Discussed physical therapy with patient who is open to the idea of this -Right occipital nerve block completed (patient declined  left occipital nerve block concurrently) -Medication overuse headaches and headache triggers discussed with patient  ______________________________________________________________________  Signed, Champ Mungo, DO Internal Medicine PGY-3  Attending Neurologist's note:  I personally saw this patient, gathering history, performing a full neurologic examination, reviewing relevant labs, personally reviewing relevant imaging including MRI brain, and formulated the assessment and plan, adding the note above for completeness and clarity to accurately reflect my thoughts  Brooke Dare MD-PhD [facility] [phone] Available 7 AM to 7 PM, outside these hours please contact Neurologist on call listed on AMION

## 2023-11-19 NOTE — Progress Notes (Signed)
Physical Therapy Evaluation Patient Details Name: Donna [name] MRN: [id number] DOB: June 23, 1971 Today's Date: 11/19/2023  History of Present Illness  52 y.o. female presented to [facility] ED with c/o persistent headache and right-sided weakness MRI at [facility] with no acute findings. PMH significant for DM2 not on meds, seizures not on meds, SLE, remote history of migraine.  Clinical Impression  PTA pt living with family in single story home with 3 steps to enter. Pt completely independent working as a Scientist, physiological and driving. Pt is currently limited in safe mobility by intense headache in presence of decreased R LE strength, sensation and coordination. Pt requiring contact guard assist for transfers and ambulation with RW. Pt noted to have increased R foot drop with ambulation sliding it along to floor. PT recommending OP Neuro PT at discharge. PT will continue to follow acutely.         If plan is discharge home, recommend the following:  (Neuro)   Can travel by private vehicle    Yes    Equipment Recommendations Rolling walker (2 wheels)     Functional Status Assessment Patient has had a recent decline in their functional status and demonstrates the ability to make significant improvements in function in a reasonable and predictable amount of time.     Precautions / Restrictions Precautions Precautions: Fall      Mobility  Bed Mobility Overal bed mobility: Needs Assistance Bed Mobility: Supine to Sit     Supine to sit: Supervision          Transfers Overall transfer level: Needs assistance   Transfers: Sit to/from Stand Sit to Stand: Contact guard assist           General transfer comment: contact guard for safety, vc for hand placement for power up and steadying    Ambulation/Gait Ambulation/Gait assistance: Contact guard assist Gait Distance (Feet): 20 Feet Assistive device: Rolling walker (2 wheels) Gait Pattern/deviations: Step-through pattern, Decreased  step length - right, Decreased dorsiflexion - right, Decreased weight shift to right Gait velocity: slowed Gait velocity interpretation: <1.31 ft/sec, indicative of household ambulator   General Gait Details: preference to L side for weightbearing, increased R foot dorsiflexion, distance limited by increased headache with being up         Balance Overall balance assessment: Mild deficits observed, not formally tested                                           Pertinent Vitals/Pain Pain Assessment Pain Assessment: Faces Faces Pain Scale: Hurts even more Pain Location: headache Pain Descriptors / Indicators: Discomfort, Grimacing, Burning Pain Intervention(s): Limited activity within patient's tolerance, Monitored during session, Repositioned    Home Living Family/patient expects to be discharged to:: Private residence Living Arrangements: Spouse/significant other;Children Available Help at Discharge: Family;Available 24 hours/day Type of Home: House Home Access: Stairs to enter Entrance Stairs-Rails: Can reach both Entrance Stairs-Number of Steps: 3   Home Layout: One level Home Equipment: Rollator (4 wheels);Hand held shower head;BSC/3in1      Prior Function Prior Level of Function : Independent/Modified Independent             Mobility Comments: receptionsit       Extremity/Trunk Assessment   Upper Extremity Assessment Upper Extremity Assessment: Defer to OT evaluation RUE Deficits / Details: inconsistently moving; not using RUE at all to assist with bed mobility then using it  to squeeze out a washcloth; elbow/wirs/hand AROM overall WFL at times however strenght 3+/5; unable to move R shoulder beyond 20 degrees FF adn hiking shoulder when trying to move RUE Sensation: WNL RUE Coordination: decreased fine motor;decreased gross motor    Lower Extremity Assessment Lower Extremity Assessment: RLE deficits/detail RLE Deficits / Details: RLE   AAROM WFL, 3/5 weakness in knee flexion and ankle dorsiflexion RLE Sensation: decreased light touch (greater proximally that distally) RLE Coordination: decreased gross motor    Cervical / Trunk Assessment Cervical / Trunk Assessment: Other exceptions (L bias)  Communication   Communication Communication: No apparent difficulties  Cognition Arousal: Alert Behavior During Therapy: WFL for tasks assessed/performed Overall Cognitive Status: Within Functional Limits for tasks assessed                                 General Comments: +        General Comments General comments (skin integrity, edema, etc.): daughter present during session, VSS on RA        Assessment/Plan    PT Assessment Patient needs continued PT services  PT Problem List Decreased strength;Decreased activity tolerance;Decreased balance;Decreased coordination;Impaired sensation;Pain       PT Treatment Interventions DME instruction;Gait training;Functional mobility training    PT Goals (Current goals can be found in the Care Plan section)  Acute Rehab PT Goals PT Goal Formulation: With patient/family Time For Goal Achievement: 12/03/23 Potential to Achieve Goals: Fair    Frequency Min 1X/week        AM-PAC PT "6 Clicks" Mobility  Outcome Measure Help needed turning from your back to your side while in a flat bed without using bedrails?: None Help needed moving from lying on your back to sitting on the side of a flat bed without using bedrails?: None Help needed moving to and from a bed to a chair (including a wheelchair)?: None Help needed standing up from a chair using your arms (e.g., wheelchair or bedside chair)?: A Little Help needed to walk in hospital room?: A Little Help needed climbing 3-5 steps with a railing? : A Little 6 Click Score: 21    End of Session Equipment Utilized During Treatment: Gait belt Activity Tolerance: Patient limited by pain Patient left: in bed;with  call bell/phone within reach;with bed alarm set;with family/visitor present Nurse Communication: Mobility status PT Visit Diagnosis: Unsteadiness on feet (R26.81);Other abnormalities of gait and mobility (R26.89);Muscle weakness (generalized) (M62.81);Difficulty in walking, not elsewhere classified (R26.2);Hemiplegia and hemiparesis;Pain;Other symptoms and signs involving the nervous system (R29.898) Hemiplegia - Right/Left: Right Pain - part of body:  (headache)    Time: 1610-9604 PT Time Calculation (min) (ACUTE ONLY): 22 min   Charges:   PT Evaluation $PT Eval Moderate Complexity: 1 Mod   PT General Charges $$ ACUTE PT VISIT: 1 Visit         Quiara Killian B. Beverely Risen PT, DPT Acute Rehabilitation Services Please use secure chat or  Call Office [phone]   Elon Alas Fleet 11/19/2023, 1:26 PM

## 2023-11-19 NOTE — TOC Benefit Eligibility Note (Signed)
Pharmacy Patient Advocate Encounter  Insurance verification completed.    The patient is insured through [facility] ADVANTAGE/RX ADVANCE   Ran test claim for Rapid Acting Insulin (Fiasp Vial/FlexTouch, Novolog Vial/FlexPen). Currently a quantity of 50mL/15mL , 18mL/15mLis a 30 day supply and the co-pay is $269.60/$520.64 , $67.51/$130.25 .  Ran test claim for Long Acting Insulin (Levemir Milas Gain, Basaglar Durenda Hurt 100unit/200 unit). Currently a quantity of 10mL, 15mL, 74mL/9mL is a 30 day supply and the co-pay is $100.58, $35.00, $473.60/$568.28   Patient has high deductible to still meet.  This test claim was processed through [facility]- copay amounts may vary at other pharmacies due to pharmacy/plan contracts, or as the patient moves through the different stages of their insurance plan.

## 2023-11-19 NOTE — Hospital Course (Signed)
Donna [name] is a 52 y.o. female with a history of diabetes mellitus type 2, seizures, SLE, migraine.  Patient presented secondary to persistent right-sided headache with right-sided upper and lower extremity weakness.  There was initial concern for possible stroke versus complicated migraine.  CT and MRI imaging were negative for acute stroke.  Neurology was consulted with concern for possible functional weakness of the upper and lower extremities in addition to possible occipital neuralgia/medication overuse headache treated with a right occipital nerve block.  During hospitalization, patient was also noted to have epigastric pain with CT evidence of mild duodenitis.  Patient unable to tolerate oral diet so GI was consulted for evaluation.

## 2023-11-19 NOTE — Plan of Care (Signed)
Received consult for conversion disorder following negative neuro workup and inconsistent neuro exam findings. Once neurology workup and all medical workup suggests no underlying biologic cause, we recommend outpatient psychiatry for psychiatric evaluation and management.   In terms of conversion disorder or psychogenic condition; most current guidelines for treatment are outpatient cognitive behavioral therapy, relaxation therapy, and or hypnosis.  In the event patient does have underlying psychiatric conditions, will recommend outpatient psychological evaluation, and collectively a treatment plan that both can be agreed on.     In most patients, conversion disorder tends to be self-limiting.  Most prognosis are associated with sudden onset, and or stressors.  The sooner patient seeks outpatient treatment, the better prognosis.  Historically confronting patients about the psychological nature of the symptoms i.e. psychogenic, can usually make them worse as most patients do not realize underlying stressors play a part in current symptoms.  Ongoing supportive psychotherapy, focused on coping with underlying conflicts and stress, can help bring about a resolution to conversion disorder.    Will update AVS to reflect outpatient services at [facility]; where patient can receive medication management, and therapy if she chooses. She will need to call and schedule.   -Psychiatry will sign off at this time.  Meryl Dare, MD PGY-1 Psychiatry Resident 11/19/2023, 2:12 PM

## 2023-11-19 NOTE — TOC Transition Note (Signed)
Transition of Care [facility]) - CM/SW Discharge Note   Patient Details  Name: Donna [name] MRN: [id number] Date of Birth: [birth year]  [facility] CM/SW Contact:  Kermit Balo, RN Phone Number: 11/19/2023, 1:00 PM   Clinical Narrative:     Pt is from home with her spouse and daughter. She has someone with her most of the time.  BSC at home.  Walker ordered through [facility] and will be delivered to her room.  Outpatient therapies arranged through [facility]. Referral sent to Neurorehab. Information on the AVS for pt to call and schedule the first appointment.  Pt states she has issues with her insurance not covering her trulicity at home. CM will send to pharmacy to verify. TOC following.  Final next level of care: OP Rehab Barriers to Discharge: No Barriers Identified   Patient Goals and CMS Choice   Choice offered to / list presented to : Patient  Discharge Placement                         Discharge Plan and Services Additional resources added to the After Visit Summary for                  DME Arranged: Walker rolling DME Agency: [facility] Date DME Agency Contacted: 11/19/23   Representative spoke with at DME Agency: Lorelle Gibbs            Social Determinants of Health (SDOH) Interventions SDOH Screenings   Food Insecurity: No Food Insecurity (11/18/2023)  Housing: Low Risk  (11/18/2023)  Transportation Needs: No Transportation Needs (11/18/2023)  Utilities: Not At Risk (11/18/2023)  Depression (PHQ2-9): Low Risk  (07/08/2022)  Social Connections: Unknown (03/16/2023)   Received from [facility]  Tobacco Use: Low Risk  (11/18/2023)     Readmission Risk Interventions     No data to display

## 2023-11-19 NOTE — Procedures (Signed)
Greater Occipital Nerve Block - right    Pre-procedure pain score: > 10/10  Positioning: Sitting in a chair lying head down exam table  Injection solution used: 0.5% bupivacaine mixed 9:1 v/v with 40mg  triamcinolone/cc soln  After informed consent, time-out, and cleansing the scalp with alcohol, 7cc of the injection solution was administered into the scalp 1/3 of the distance between occipital tuberosity and the right mastoid in a fan-like manner using negative aspiration technique, centered at a site of local tenderness.  Within moments, scalp anesthesia was obtained in the appropriate distribution.  Overall pain score after the injection was 8/10  Complications: None  Patient felt some relief and tolerated the procedure well   Brooke Dare MD-PhD [facility] [phone] Available 7 AM to 7 PM, outside these hours please contact Neurologist on call listed on AMION

## 2023-11-19 NOTE — Progress Notes (Signed)
PROGRESS NOTE    Donna [name]  FYB:[id number] DOB: 01-14-1971 DOA: 11/17/2023 PCP: Fleet Contras, MD   Brief Narrative: Donna [name] is a 52 y.o. female with a history of diabetes mellitus type 2, seizures, SLE, migraine.  Patient presented secondary to persistent right-sided headache with right-sided upper and lower extremity weakness.  There was initial concern for possible stroke versus complicated migraine.  CT and MRI imaging were negative for acute stroke.  Neurology was consulted with concern for possible functional weakness of the upper and lower extremities in addition to possible occipital neuralgia/medication overuse headache treated with a right occipital nerve block.  During hospitalization, patient was also noted to have epigastric pain with CT evidence of mild duodenitis.  Patient unable to tolerate oral diet so GI was consulted for evaluation.   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 Neurology performed a right occipital nerve block on 12/6 with some improvement in pain.  Right-sided limb weakness Unclear etiology, but appears to be functional and triggered by headache. Stroke workup was negative for etiology. Psychiatry consulted and declined inpatient evaluation; recommending outpatient follow-up. PT and OT recommending outpatient PT/OT. Weakness has improved, per patient, after nerve block.  Epigastric pain Lipase, LFTs normal. CT abdomen/pelvis significant for mildly thickened proximal duodenum consistent with duodenitis.  CT without evidence of acute pancreatitis, cholecystitis, biliary tree dilation.  Patient unable to tolerate oral intake, however. -GI consult -Protonix -NPO after midnight  Diabetes mellitus, type 2 Uncontrolled with hemoglobin A1C of 9.6%. Patient is on Trulicity as an outpatient but has ran out recently.  Patient started on Semglee and SSI on admission. Patient declines change in therapy as an outpatient and will follow-up with her PCP. -Continue Semglee and SSI while inpatient.  SLE Patient is on prednisone as needed for flare ups.  History of seizures Noted. Not on AEDs.   DVT prophylaxis: Lovenox Code Status:   Code Status: Full Code Family Communication: Daughter at bedside Disposition Plan: Discharge pending GI workup/recommendations   Consultants:  Neurology Gastroenterology  Procedures:  Right occipital nerve block  Antimicrobials: None    Subjective: Patient reports right-sided headache and right sided upper and lower extremity weakness.  Objective: BP 137/79 (BP Location: Right Arm)   Pulse 71   Temp 98 F (36.7 C) (Oral)   Resp 18   Ht 5' (1.524 m)   Wt 72.6 kg   LMP 06/14/2003   SpO2 98%   BMI 31.25 kg/m   Examination:  General exam: Appears calm and comfortable Respiratory system: Clear to auscultation. Respiratory effort normal. Cardiovascular system: S1 & S2 heard, RRR. No murmurs. Gastrointestinal system: Abdomen is nondistended, soft and nontender. Normal bowel sounds heard. Central nervous system: Alert and oriented. 4/5 right upper and lower extremity strength Musculoskeletal: No edema. No calf tenderness. No scalp tenderness. Psychiatry: Judgement and insight appear normal. Mood & affect appropriate.    Data Reviewed: I have personally reviewed following labs and imaging studies  CBC Lab Results  Component Value Date   WBC 13.1 (H) 11/19/2023   RBC 4.55 11/19/2023   HGB 12.0 11/19/2023   HCT 37.6 11/19/2023   MCV 82.6 11/19/2023   MCH 26.4 11/19/2023   PLT 349 11/19/2023   MCHC 31.9 11/19/2023   RDW 13.3 11/19/2023   LYMPHSABS 2.3 06/09/2023   MONOABS 0.5 06/09/2023   EOSABS 0.1 06/09/2023   BASOSABS 0.1 06/09/2023  Last metabolic panel Lab Results  Component Value Date   NA 135 11/19/2023   K 3.5 11/19/2023   CL 101  11/19/2023   CO2 24 11/19/2023   BUN 19 11/19/2023   CREATININE 0.91 11/19/2023   GLUCOSE 252 (H) 11/19/2023   GFRNONAA >60 11/19/2023   GFRAA >60 08/21/2020   CALCIUM 9.6 11/19/2023   PHOS 4.1 03/11/2018   PROT 8.2 (H) 11/18/2023   ALBUMIN 4.7 11/18/2023   LABGLOB 2.7 07/08/2022   AGRATIO 1.7 07/08/2022   BILITOT 0.4 11/18/2023   ALKPHOS 96 11/18/2023   AST 19 11/18/2023   ALT 18 11/18/2023   ANIONGAP 10 11/19/2023    GFR: Estimated Creatinine Clearance: 62.8 mL/min (by C-G formula based on SCr of 0.91 mg/dL).  No results found for this or any previous visit (from the past 240 hour(s)).    Radiology Studies: MR BRAIN WO CONTRAST  Result Date: 11/19/2023 CLINICAL DATA:  Acute neurologic deficit. Headache and right-sided weakness. EXAM: MRI HEAD WITHOUT CONTRAST TECHNIQUE: Multiplanar, multiecho pulse sequences of the brain and surrounding structures were obtained without intravenous contrast. COMPARISON:  None Available. FINDINGS: Brain: No acute infarct, mass effect or extra-axial collection. No chronic microhemorrhage or siderosis. Normal white matter signal, parenchymal volume and CSF spaces. The midline structures are normal. Vascular: Normal flow voids. Skull and upper cervical spine: Normal marrow signal. Sinuses/Orbits: Negative. Other: None. IMPRESSION: Normal brain MRI. Electronically Signed   By: Deatra Robinson M.D.   On: 11/19/2023 02:43   US Abdomen Limited RUQ (LIVER/GB)  Result Date: 11/18/2023 CLINICAL DATA:  Right upper quadrant abdominal tenderness. EXAM: ULTRASOUND ABDOMEN LIMITED RIGHT UPPER QUADRANT COMPARISON:  None Available. FINDINGS: Gallbladder: No gallstones or wall thickening visualized. No sonographic Murphy sign noted by sonographer. Common bile duct: Diameter: 2.5 mm Liver: No focal lesion identified. Within normal limits in parenchymal echogenicity. Portal vein is patent on color Doppler imaging with normal direction of blood flow towards the liver.  Other: None. IMPRESSION: Normal right upper quadrant ultrasound. Electronically Signed   By: Signa Kell M.D.   On: 11/18/2023 20:26   CT ABDOMEN PELVIS WO CONTRAST  Result Date: 11/18/2023 CLINICAL DATA:  Right upper quadrant pain. EXAM: CT ABDOMEN AND PELVIS WITHOUT CONTRAST TECHNIQUE: Multidetector CT imaging of the abdomen and pelvis was performed following the standard protocol without IV contrast. RADIATION DOSE REDUCTION: This exam was performed according to the departmental dose-optimization program which includes automated exposure control, adjustment of the mA and/or kV according to patient size and/or use of iterative reconstruction technique. COMPARISON:  June 09, 2023 FINDINGS: Lower chest: No acute abnormality. Hepatobiliary: No focal liver abnormality is seen. No gallstones, gallbladder wall thickening, or biliary dilatation. Pancreas: Unremarkable. No pancreatic ductal dilatation or surrounding inflammatory changes. Spleen: Normal in size without focal abnormality. Adrenals/Urinary Tract: Adrenal glands are unremarkable. Kidneys are normal in size, without renal calculi or hydronephrosis. A stable 5.7 cm diameter cyst is seen within the mid left kidney. Bladder is unremarkable. Stomach/Bowel: Stomach is within normal limits. Appendix appears normal. No evidence of bowel dilatation. A short segment of very mildly thickened proximal duodenum is noted (axial CT images 30 through 37, CT series 2). Vascular/Lymphatic: No significant vascular findings are present. No enlarged abdominal or pelvic lymph nodes. Reproductive: Status post hysterectomy. No adnexal masses. Other: No abdominal wall hernia or abnormality. No abdominopelvic ascites. Musculoskeletal: No acute or significant osseous findings. IMPRESSION: 1. Short segment of very mildly thickened proximal duodenum which may represent mild duodenitis. 2. Stable left  renal cyst. No follow-up imaging is recommended. This recommendation follows ACR  consensus guidelines: Management of the Incidental Renal Mass on CT: A White Paper of the ACR Incidental Findings Committee. J Am Coll Radiol [phone]. Electronically Signed   By: Aram Candela M.D.   On: 11/18/2023 01:58   CT Head Wo Contrast  Result Date: 11/17/2023 CLINICAL DATA:  Headache, neuro deficit EXAM: CT HEAD WITHOUT CONTRAST TECHNIQUE: Contiguous axial images were obtained from the base of the skull through the vertex without intravenous contrast. RADIATION DOSE REDUCTION: This exam was performed according to the departmental dose-optimization program which includes automated exposure control, adjustment of the mA and/or kV according to patient size and/or use of iterative reconstruction technique. COMPARISON:  CT head 08/21/2020 FINDINGS: Brain: No evidence of large-territorial acute infarction. No parenchymal hemorrhage. No mass lesion. No extra-axial collection. No mass effect or midline shift. No hydrocephalus. Basilar cisterns are patent. Vascular: No hyperdense vessel. Skull: No acute fracture or focal lesion. Sinuses/Orbits: Paranasal sinuses and mastoid air cells are clear. The orbits are unremarkable. Other: None. IMPRESSION: No acute intracranial abnormality. Electronically Signed   By: Tish Frederickson M.D.   On: 11/17/2023 23:48      LOS: 0 days    Jacquelin Hawking, MD [facility] 11/19/2023, 12:06 PM   If 7PM-7AM, please contact night-coverage [email or web address]

## 2023-11-20 ENCOUNTER — Observation Stay (HOSPITAL_COMMUNITY): Payer: 59

## 2023-11-20 DIAGNOSIS — R935 Abnormal findings on diagnostic imaging of other abdominal regions, including retroperitoneum: Secondary | ICD-10-CM

## 2023-11-20 DIAGNOSIS — R1013 Epigastric pain: Secondary | ICD-10-CM | POA: Diagnosis present

## 2023-11-20 DIAGNOSIS — G40909 Epilepsy, unspecified, not intractable, without status epilepticus: Secondary | ICD-10-CM | POA: Diagnosis present

## 2023-11-20 DIAGNOSIS — Z5971 Insufficient health insurance coverage: Secondary | ICD-10-CM | POA: Diagnosis not present

## 2023-11-20 DIAGNOSIS — R1310 Dysphagia, unspecified: Secondary | ICD-10-CM | POA: Diagnosis not present

## 2023-11-20 DIAGNOSIS — K219 Gastro-esophageal reflux disease without esophagitis: Secondary | ICD-10-CM | POA: Diagnosis present

## 2023-11-20 DIAGNOSIS — I10 Essential (primary) hypertension: Secondary | ICD-10-CM | POA: Diagnosis present

## 2023-11-20 DIAGNOSIS — E1165 Type 2 diabetes mellitus with hyperglycemia: Secondary | ICD-10-CM | POA: Diagnosis present

## 2023-11-20 DIAGNOSIS — Z7985 Long-term (current) use of injectable non-insulin antidiabetic drugs: Secondary | ICD-10-CM | POA: Diagnosis not present

## 2023-11-20 DIAGNOSIS — M329 Systemic lupus erythematosus, unspecified: Secondary | ICD-10-CM | POA: Diagnosis present

## 2023-11-20 DIAGNOSIS — Z86718 Personal history of other venous thrombosis and embolism: Secondary | ICD-10-CM | POA: Diagnosis not present

## 2023-11-20 DIAGNOSIS — E739 Lactose intolerance, unspecified: Secondary | ICD-10-CM | POA: Diagnosis present

## 2023-11-20 DIAGNOSIS — K5710 Diverticulosis of small intestine without perforation or abscess without bleeding: Secondary | ICD-10-CM | POA: Diagnosis present

## 2023-11-20 DIAGNOSIS — R109 Unspecified abdominal pain: Secondary | ICD-10-CM | POA: Diagnosis not present

## 2023-11-20 DIAGNOSIS — R531 Weakness: Secondary | ICD-10-CM | POA: Diagnosis not present

## 2023-11-20 DIAGNOSIS — Z91030 Bee allergy status: Secondary | ICD-10-CM | POA: Diagnosis not present

## 2023-11-20 DIAGNOSIS — Z800 Family history of malignant neoplasm of digestive organs: Secondary | ICD-10-CM | POA: Diagnosis not present

## 2023-11-20 DIAGNOSIS — G43909 Migraine, unspecified, not intractable, without status migrainosus: Secondary | ICD-10-CM | POA: Diagnosis present

## 2023-11-20 DIAGNOSIS — Z820 Family history of epilepsy and other diseases of the nervous system: Secondary | ICD-10-CM | POA: Diagnosis not present

## 2023-11-20 DIAGNOSIS — M5481 Occipital neuralgia: Secondary | ICD-10-CM | POA: Diagnosis present

## 2023-11-20 DIAGNOSIS — Z803 Family history of malignant neoplasm of breast: Secondary | ICD-10-CM | POA: Diagnosis not present

## 2023-11-20 DIAGNOSIS — Z91041 Radiographic dye allergy status: Secondary | ICD-10-CM | POA: Diagnosis not present

## 2023-11-20 DIAGNOSIS — Z823 Family history of stroke: Secondary | ICD-10-CM | POA: Diagnosis not present

## 2023-11-20 DIAGNOSIS — Z79899 Other long term (current) drug therapy: Secondary | ICD-10-CM | POA: Diagnosis not present

## 2023-11-20 DIAGNOSIS — Z8041 Family history of malignant neoplasm of ovary: Secondary | ICD-10-CM | POA: Diagnosis not present

## 2023-11-20 DIAGNOSIS — Z886 Allergy status to analgesic agent status: Secondary | ICD-10-CM | POA: Diagnosis not present

## 2023-11-20 DIAGNOSIS — Z888 Allergy status to other drugs, medicaments and biological substances status: Secondary | ICD-10-CM | POA: Diagnosis not present

## 2023-11-20 LAB — GLUCOSE, CAPILLARY
Glucose-Capillary: 149 mg/dL — ABNORMAL HIGH (ref 70–99)
Glucose-Capillary: 169 mg/dL — ABNORMAL HIGH (ref 70–99)
Glucose-Capillary: 138 mg/dL — ABNORMAL HIGH (ref 70–99)
Glucose-Capillary: 160 mg/dL — ABNORMAL HIGH (ref 70–99)
Glucose-Capillary: 186 mg/dL — ABNORMAL HIGH (ref 70–99)

## 2023-11-20 MED ORDER — SODIUM CHLORIDE 0.45 % IV SOLN: INTRAVENOUS | Status: AC

## 2023-11-20 MED ORDER — PANTOPRAZOLE SODIUM 40 MG IV SOLR
40.0000 mg | Freq: Two times a day (BID) | INTRAVENOUS | Status: DC
  Administered 2023-11-20 – 2023-11-22 (×4): 40 mg via INTRAVENOUS
  Filled 2023-11-20 (×4): qty 10

## 2023-11-20 MED ORDER — PANTOPRAZOLE SODIUM 40 MG PO TBEC
40.0000 mg | DELAYED_RELEASE_TABLET | Freq: Two times a day (BID) | ORAL | Status: DC
  Administered 2023-11-20: 40 mg via ORAL
  Filled 2023-11-20: qty 1

## 2023-11-20 NOTE — Plan of Care (Signed)
  Problem: Education: Goal: Knowledge of disease or condition will improve Outcome: Progressing Goal: Knowledge of secondary prevention will improve (MUST DOCUMENT ALL) Outcome: Progressing Goal: Knowledge of patient specific risk factors will improve Loraine Leriche N/A or DELETE if not current risk factor) Outcome: Progressing   Problem: Ischemic Stroke/TIA Tissue Perfusion: Goal: Complications of ischemic stroke/TIA will be minimized 11/20/2023 2318 by Nicole Cella, RN Outcome: Progressing 11/20/2023 2317 by Nicole Cella, RN Outcome: Progressing   Problem: Coping: Goal: Will verbalize positive feelings about self 11/20/2023 2318 by Nicole Cella, RN Outcome: Progressing 11/20/2023 2317 by Nicole Cella, RN Outcome: Progressing Goal: Will identify appropriate support needs 11/20/2023 2318 by Nicole Cella, RN Outcome: Progressing 11/20/2023 2317 by Nicole Cella, RN Outcome: Progressing   Problem: Health Behavior/Discharge Planning: Goal: Ability to manage health-related needs will improve 11/20/2023 2318 by Nicole Cella, RN Outcome: Progressing 11/20/2023 2317 by Nicole Cella, RN Outcome: Progressing Goal: Goals will be collaboratively established with patient/family 11/20/2023 2318 by Nicole Cella, RN Outcome: Progressing 11/20/2023 2317 by Nicole Cella, RN Outcome: Progressing

## 2023-11-20 NOTE — Consult Note (Addendum)
Referring Provider: Dr. Jacquelin Hawking  Primary Care Physician:  Fleet Contras, MD Primary Gastroenterologist: Gentry Fitz  Reason for Consultation:  Epigastric pain   HPI: Donna [name] is a 51 y.o. female with a past medical history of diabetes mellitus type 2, systemic lupus erythematosus, migraine headaches, seizure disorder (last seizure 06/2013) and GERD. She presented to the ED 11/17/2023 with right sided headache and right sided upper and lower extremity weakness.  There was initial concern for possible CVA versus vascular migraine.  CT and MRI were negative for acute stroke.  Admission labs showed a WBC count of 7.3.  Hemoglobin 12.9.  Hematocrit 40.5.  Platelet 317.  Normal LFTs and lipase level.  HIV nonreactive.  Patient noted having epigastric pain. RUQ sonogram was normal. CTAP 11/18/2023 with contrast showed very mildly thickened proximal duodenum suggestive of mild duodenitis and stable left renal cyst.  A GI consult was requested for further evaluation regarding upper abdominal pain with possible duodenitis.  She developed RUQ pain which radiated to the epigastric area 1 week ago which has progressively worsened.  Her upper abdominal pain somewhat worsens after eating and has remained constant today.  She is in bed shaking due to her level of pain at this time.  She stated sitting up and walking in the hall without relief. She denies having any heartburn as long as she takes Prilosec 20 mg twice daily.  She sometimes has difficulty swallowing meats otherwise no dysphagia.  No NSAID use.  She last took prednisone for a lupus flare approximately 7 months ago.  No alcohol use.  She has chronic severe constipation, passes a normal brown bowel movement 3 times monthly.  Last bowel movement was on 12/4, she reported was a good bowel movement.  She infrequently sees bright red blood on the toilet tissue.  No black stools.   PAST GI PROCEDURES:   EGD 10/04/2013: Mild esophagitis consistent with  reflux esophagitis at the gastroesophageal junction The esophagus was otherwise normal 2 cm hiatal hernia Mucosa of the stomach appeared normal, biopsies were taken in the antrum and angularis The duodenal mucosa showed no abnormality in the bulb and second portion of the duodenum  Colonoscopy 10/04/2013: Significant amount of stool throughout the examined colon Inadequate for the detection of small polyps   Past Medical History:  Diagnosis Date   DVT (deep venous thrombosis) (HCC)    right arm   Endometriosis    Lupus (systemic lupus erythematosus) (HCC) 2007   Migraine    Seizures (HCC)    last sz 06/2013, no meds    Past Surgical History:  Procedure Laterality Date   ABDOMINAL HYSTERECTOMY     x2 partial   CESAREAN SECTION     x3   MASS EXCISION  02/2014   abd    Prior to Admission medications   Medication Sig Start Date End Date Taking? Authorizing Provider  acetaminophen (TYLENOL) 500 MG tablet Take 500 mg by mouth every 6 (six) hours as needed for moderate pain.   Yes [provider]  ASHWAGANDHA PO Take 1 tablet by mouth daily.   Yes [provider]  Chlorphen-Pseudoephed-APAP (CORICIDIN D PO) Take 1 Dose by mouth at bedtime as needed (Cough/Cough).   Yes [provider]  cholecalciferol (VITAMIN D) 1000 units tablet Take 1,000 Units by mouth daily.   Yes [provider]  EPINEPHrine (EPIPEN 2-PAK) 0.3 mg/0.3 mL IJ SOAJ injection Inject 0.3 mLs (0.3 mg total) into the muscle once as needed (for severe  allergic reaction). 06/28/20  Yes Molpus, Zaylan Kissoon, MD  vitamin B-12 (CYANOCOBALAMIN) 1000 MCG tablet Take 1,000 mcg by mouth daily.   Yes [provider]  Dulaglutide (TRULICITY) 0.75 MG/0.5ML SOPN Inject 0.75 mg into the skin once a week. Patient not taking: Reported on 11/18/2023 01/18/23   Hoy Register, MD  dicyclomine (BENTYL) 20 MG tablet Take 1 tablet (20 mg total) by mouth 2 (two) times daily as needed for spasms. 02/20/19  06/28/20  Fawze, Mina A, PA-C  ipratropium-albuterol (DUONEB) 0.5-2.5 (3) MG/3ML SOLN Take 3 mLs by nebulization every 4 (four) hours as needed. Patient not taking: Reported on 09/22/2018 03/11/18 06/07/19  Marguerita Merles Latif, DO  omeprazole (PRILOSEC) 20 MG capsule Take 1 capsule (20 mg total) by mouth 2 (two) times daily before a meal. 06/07/19 06/28/20  Pollina, Canary Brim, MD    Current Facility-Administered Medications  Medication Dose Route Frequency Provider Last Rate Last Admin   albuterol (PROVENTIL) (2.5 MG/3ML) 0.083% nebulizer solution 2.5 mg  2.5 mg Nebulization Q6H PRN Dahal, Melina Schools, MD       butalbital-acetaminophen-caffeine (FIORICET) 50-325-40 MG per tablet 1 tablet  1 tablet Oral Q6H PRN Dahal, Melina Schools, MD   1 tablet at 11/19/23 1037   enoxaparin (LOVENOX) injection 40 mg  40 mg Subcutaneous Q24H Dahal, Melina Schools, MD   40 mg at 11/20/23 1043   hydrALAZINE (APRESOLINE) injection 10 mg  10 mg Intravenous Q6H PRN Dahal, Melina Schools, MD       HYDROcodone-acetaminophen (NORCO/VICODIN) 5-325 MG per tablet 1-2 tablet  1-2 tablet Oral Q4H PRN Narda Bonds, MD       HYDROmorphone (DILAUDID) injection 1 mg  1 mg Intravenous Q4H PRN Sundil, Subrina, MD   1 mg at 11/20/23 1041   insulin aspart (novoLOG) injection 0-15 Units  0-15 Units Subcutaneous TID WC Dahal, Melina Schools, MD   3 Units at 11/20/23 0813   insulin aspart (novoLOG) injection 0-5 Units  0-5 Units Subcutaneous QHS Dahal, Melina Schools, MD       insulin glargine-yfgn (SEMGLEE) injection 5 Units  5 Units Subcutaneous QHS Dahal, Melina Schools, MD   5 Units at 11/19/23 2052   lidocaine (LMX) 4 % cream   Topical TID PRN Champ Mungo, DO       methocarbamol (ROBAXIN) tablet 750 mg  750 mg Oral Q6H PRN Narda Bonds, MD   750 mg at 11/19/23 1448   ondansetron (ZOFRAN) injection 4 mg  4 mg Intravenous Q6H PRN Dahal, Melina Schools, MD   4 mg at 11/20/23 1039   pantoprazole (PROTONIX) EC tablet 40 mg  40 mg Oral BID Narda Bonds, MD   40 mg at 11/20/23 1044     Allergies as of 11/17/2023 - Reviewed 11/17/2023  Allergen Reaction Noted   Bee venom Anaphylaxis 01/17/2017   Diamox [acetazolamide] Anaphylaxis 07/21/2013   Nsaids Anaphylaxis 03/26/2013   Omnipaque [iohexol] Anaphylaxis 12/16/2011   Tolmetin Anaphylaxis 07/20/2014   Compazine [prochlorperazine] Hives 03/26/2013   Iopamidol Hives 01/17/2017   Lactose intolerance (gi) Nausea And Vomiting 06/11/2013   Reglan [metoclopramide] Hives 03/26/2013    Family History  Problem Relation Age of Onset   CVA Mother 13   Alzheimer's disease Father    Breast cancer Sister    Ovarian cancer Sister    Stomach cancer Sister    Breast cancer Maternal Aunt    Ovarian cancer Maternal Aunt    Stomach cancer Maternal Aunt    Birth defects Maternal Uncle    Colon cancer Neg Hx  Esophageal cancer Neg Hx    Pancreatic cancer Neg Hx     Social History   Socioeconomic History   Marital status: Married    Spouse name: Not on file   Number of children: 3   Years of education: Not on file   Highest education level: Not on file  Occupational History   Occupation: Agricultural engineer: [employer]  Tobacco Use   Smoking status: Never   Smokeless tobacco: Never  Vaping Use   Vaping status: Never Used  Substance and Sexual Activity   Alcohol use: No   Drug use: No   Sexual activity: Not Currently    Birth control/protection: Surgical  Other Topics Concern   Not on file  Social History Narrative   Not on file   Social Determinants of Health   Financial Resource Strain: Not on file  Food Insecurity: No Food Insecurity (11/18/2023)   Hunger Vital Sign    Worried About Running Out of Food in the Last Year: Never true    Ran Out of Food in the Last Year: Never true  Transportation Needs: No Transportation Needs (11/18/2023)   PRAPARE - Administrator, Civil Service (Medical): No    Lack of Transportation (Non-Medical): No  Physical Activity: Not on file  Stress:  Not on file  Social Connections: Unknown (03/16/2023)   Received from [facility], [facility]   Social Network    Social Network: Not on file  Intimate Partner Violence: Not At Risk (11/18/2023)   Humiliation, Afraid, Rape, and Kick questionnaire    Fear of Current or Ex-Partner: No    Emotionally Abused: No    Physically Abused: No    Sexually Abused: No    Review of Systems: Gen: Denies fever, sweats or chills. No weight loss.  CV: Denies chest pain, palpitations or edema. Resp: Denies cough, shortness of breath of hemoptysis.  GI: See HPI.   GU : Denies urinary burning, blood in urine, increased urinary frequency or incontinence. MS: Denies joint pain, muscles aches or weakness. Derm: Denies rash, itchiness, skin lesions or unhealing ulcers. Psych: Denies depression, anxiety, memory loss or confusion. Heme: Denies easy bruising, bleeding. Neuro:  + Migraine headaches. Endo: + DM type II.   Physical Exam: Vital signs in last 24 hours: Temp:  [97.7 F (36.5 C)-98.1 F (36.7 C)] 98 F (36.7 C) (12/07 0825) Pulse Rate:  [61-86] 64 (12/07 0825) Resp:  [17-18] 17 (12/07 0825) BP: (108-140)/(63-73) 108/65 (12/07 0825) SpO2:  [97 %-100 %] 100 % (12/07 0825) Last BM Date : 11/18/23 General:  Alert 52 year old female, shaking in bed due to significant upper abdominal pain. Head:  Normocephalic and atraumatic. Eyes:  No scleral icterus. Conjunctiva pink. Ears:  Normal auditory acuity. Nose:  No deformity, discharge or lesions. Mouth:  Dentition intact. No ulcers or lesions.  Neck:  Supple. No lymphadenopathy or thyromegaly.  Lungs: Breath sounds clear throughout. No wheezes, rhonchi or crackles.  Heart: Regular rate and rhythm, no murmurs. Abdomen: Moderate RUQ and epigastric tenderness with rebound to the RUQ area.  Prominent diastasis recti.  Positive bowel sounds all 4 quadrants.  No palpable mass.  No bruit. Rectal: Deferred. Musculoskeletal:  Symmetrical without gross  deformities.  Pulses:  Normal pulses noted. Extremities:  Without clubbing or edema. Neurologic:  Alert and  oriented x 4. No focal deficits.  Skin:  Intact without significant lesions or rashes. Psych:  Alert and cooperative. Normal mood and  affect.  Intake/Output from previous day: No intake/output data recorded. Intake/Output this shift: No intake/output data recorded.  Lab Results: Recent Labs    11/17/23 2255 11/19/23 0449  WBC 7.3 13.1*  HGB 12.9 12.0  HCT 40.5 37.6  PLT 317 349   BMET Recent Labs    11/18/23 0023 11/19/23 0449  NA 138 135  K 3.8 3.5  CL 101 101  CO2 28 24  GLUCOSE 180* 252*  BUN 18 19  CREATININE 0.86 0.91  CALCIUM 10.4* 9.6   LFT Recent Labs    11/18/23 0023  PROT 8.2*  ALBUMIN 4.7  AST 19  ALT 18  ALKPHOS 96  BILITOT 0.4   PT/INR No results for input(s): "LABPROT", "INR" in the last 72 hours. Hepatitis Panel No results for input(s): "HEPBSAG", "HCVAB", "HEPAIGM", "HEPBIGM" in the last 72 hours.    Studies/Results: DG Abd Portable 1V  Result Date: 11/20/2023 CLINICAL DATA:  Abdominal pain with nausea and guarding. EXAM: PORTABLE ABDOMEN - 1 VIEW COMPARISON:  Abdomen and pelvis CT dated 11/18/2023. FINDINGS: Normal bowel-gas pattern with a paucity of intestinal gas. Unremarkable bones. Interval elevation of the right hemidiaphragm. IMPRESSION: 1. Normal bowel-gas pattern with a paucity of intestinal gas. 2. Interval elevation of the right hemidiaphragm. Electronically Signed   By: Beckie Salts M.D.   On: 11/20/2023 11:43   MR BRAIN WO CONTRAST  Result Date: 11/19/2023 CLINICAL DATA:  Acute neurologic deficit. Headache and right-sided weakness. EXAM: MRI HEAD WITHOUT CONTRAST TECHNIQUE: Multiplanar, multiecho pulse sequences of the brain and surrounding structures were obtained without intravenous contrast. COMPARISON:  None Available. FINDINGS: Brain: No acute infarct, mass effect or extra-axial collection. No chronic  microhemorrhage or siderosis. Normal white matter signal, parenchymal volume and CSF spaces. The midline structures are normal. Vascular: Normal flow voids. Skull and upper cervical spine: Normal marrow signal. Sinuses/Orbits: Negative. Other: None. IMPRESSION: Normal brain MRI. Electronically Signed   By: Deatra Robinson M.D.   On: 11/19/2023 02:43   US Abdomen Limited RUQ (LIVER/GB)  Result Date: 11/18/2023 CLINICAL DATA:  Right upper quadrant abdominal tenderness. EXAM: ULTRASOUND ABDOMEN LIMITED RIGHT UPPER QUADRANT COMPARISON:  None Available. FINDINGS: Gallbladder: No gallstones or wall thickening visualized. No sonographic Murphy sign noted by sonographer. Common bile duct: Diameter: 2.5 mm Liver: No focal lesion identified. Within normal limits in parenchymal echogenicity. Portal vein is patent on color Doppler imaging with normal direction of blood flow towards the liver. Other: None. IMPRESSION: Normal right upper quadrant ultrasound. Electronically Signed   By: Signa Kell M.D.   On: 11/18/2023 20:26    IMPRESSION/PLAN:  History of GERD and a hiatal with onset RUQ and epigastrici pain x 1 week. RUQ sonogram was normal.  CTAP 11/18/2023 with contrast showed very mildly thickened proximal duodenum suggestive of mild duodenitis.  Normal LFTs and lipase level.  On Pantoprazole 40 mg po every day.  Patient endorses having constant severe RUQ/epigastric pain for the past 24 hours, patient shaking in bed due to level of pain at this time.  RUQ rebound on exam.  Hemodynamically stable. -Pantoprazole 40 mg IV twice daily -Consider repeat CTAP with contrast if abdominal pain persists or worsens.  -EGD tomorrow benefits and risks discussed including risk with sedation, risk of bleeding, perforation and infection -NPO after midnight  -Pain management per the hospitalist   Diabetes Mellitus type 2  History of Lupus  Colon cancer screening.  Colonoscopy 10/04/2013 resulted in a poor prep, stool  throughout the examined colon. -Outpatient colonoscopy  Malachi Carl Kennedy-Smith  11/20/2023, 2:55PM  GI ATTENDING  History, laboratories, x-rays all personally reviewed.  Patient personally seen and examined.  Daughter and granddaughter is in room at bedside.  Agree with comprehensive consultation note as outlined above. Patient presents to the hospital with neurologic complaints.  Reports to her primary care team that she has been having abdominal pain for a week or 2.  Vague in nature.  CT scan suggest possible duodenitis.  Abdominal exam benign.  Plan upper endoscopy to evaluate pain and clarify CT findings.  I have her scheduled for tomorrow.The nature of the procedure, as well as the risks, benefits, and alternatives were carefully and thoroughly reviewed with the patient. Ample time for discussion and questions allowed. The patient understood, was satisfied, and agreed to proceed.  Wilhemina Bonito. Eda Keys., M.D. [facility] Division of Gastroenterology

## 2023-11-20 NOTE — Progress Notes (Signed)
PROGRESS NOTE    Donna [name]  OZH:[id number] DOB: August 14, 1971 DOA: 11/17/2023 PCP: Fleet Contras, MD   Brief Narrative: Donna [name] is a 52 y.o. female with a history of diabetes mellitus type 2, seizures, SLE, migraine.  Patient presented secondary to persistent right-sided headache with right-sided upper and lower extremity weakness.  There was initial concern for possible stroke versus complicated migraine.  CT and MRI imaging were negative for acute stroke.  Neurology was consulted with concern for possible functional weakness of the upper and lower extremities in addition to possible occipital neuralgia/medication overuse headache treated with a right occipital nerve block.  During hospitalization, patient was also noted to have epigastric pain with CT evidence of mild duodenitis.  Patient unable to tolerate oral diet so GI was consulted for evaluation.   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 Neurology performed a right occipital nerve block on 12/6 with improvement in pain.  Right-sided limb weakness Unclear etiology, but appears to be functional and triggered by headache. Stroke workup was negative for etiology. Psychiatry consulted and declined inpatient evaluation; recommending outpatient follow-up. PT and OT recommending outpatient PT/OT. Weakness has improved, per patient, after nerve block.  Epigastric pain Lipase, LFTs normal. CT abdomen/pelvis significant for mildly thickened proximal duodenum consistent with duodenitis.  CT without evidence of acute pancreatitis, cholecystitis, biliary tree dilation.  Patient unable to tolerate oral intake, however. Continued abdominal pain. GI Consulted -GI consult pending -Protonix BID -NPO after midnight -Check stat abdominal x-ray to ensure worrisome progression  Diabetes mellitus, type  2 Uncontrolled with hemoglobin A1C of 9.6%. Patient is on Trulicity as an outpatient but has ran out recently. Patient started on Semglee and SSI on admission. Patient declines change in therapy as an outpatient and will follow-up with her PCP. -Continue Semglee and SSI while inpatient.  SLE Patient is on prednisone as needed for flare ups.  History of seizures Noted. Not on AEDs.   DVT prophylaxis: Lovenox Code Status:   Code Status: Full Code Family Communication: Daughter at bedside Disposition Plan: Discharge pending GI workup/recommendations   Consultants:  Neurology Gastroenterology  Procedures:  Right occipital nerve block  Antimicrobials: None    Subjective: Patient is pleased with improvement in headache and right-sided weakness. She still has ongoing abdominal pain. Norco has helped with her pain, but it has not been relieved.   Objective: BP 108/65 (BP Location: Right Arm)   Pulse 64   Temp 98 F (36.7 C) (Oral)   Resp 17   Ht 5' (1.524 m)   Wt 72.6 kg   LMP 06/14/2003   SpO2 100%   BMI 31.25 kg/m   Examination:  General exam: Generalized body shaking in bed Respiratory system: Clear to auscultation. Respiratory effort normal. Cardiovascular system: S1 & S2 heard, RRR. No murmurs, rubs, gallops or clicks. Gastrointestinal system: Abdomen is nondistended, soft and tender. Normal bowel sounds heard. Central nervous system: Alert and oriented. 4/5 LUE strength compared to 5/5 RUE strength Musculoskeletal: No edema. No calf tenderness Psychiatry: Judgement and insight appear normal. Mood & affect appropriate.    Data Reviewed: I have personally reviewed following labs and imaging studies  CBC Lab Results  Component Value Date   WBC 13.1 (H) 11/19/2023   RBC 4.55 11/19/2023   HGB 12.0 11/19/2023   HCT 37.6 11/19/2023   MCV 82.6 11/19/2023   MCH 26.4 11/19/2023   PLT 349  11/19/2023   MCHC 31.9 11/19/2023   RDW 13.3 11/19/2023   LYMPHSABS 2.3  06/09/2023   MONOABS 0.5 06/09/2023   EOSABS 0.1 06/09/2023   BASOSABS 0.1 06/09/2023     Last metabolic panel Lab Results  Component Value Date   NA 135 11/19/2023   K 3.5 11/19/2023   CL 101 11/19/2023   CO2 24 11/19/2023   BUN 19 11/19/2023   CREATININE 0.91 11/19/2023   GLUCOSE 252 (H) 11/19/2023   GFRNONAA >60 11/19/2023   GFRAA >60 08/21/2020   CALCIUM 9.6 11/19/2023   PHOS 4.1 03/11/2018   PROT 8.2 (H) 11/18/2023   ALBUMIN 4.7 11/18/2023   LABGLOB 2.7 07/08/2022   AGRATIO 1.7 07/08/2022   BILITOT 0.4 11/18/2023   ALKPHOS 96 11/18/2023   AST 19 11/18/2023   ALT 18 11/18/2023   ANIONGAP 10 11/19/2023    GFR: Estimated Creatinine Clearance: 62.8 mL/min (by C-G formula based on SCr of 0.91 mg/dL).  No results found for this or any previous visit (from the past 240 hour(s)).    Radiology Studies: MR BRAIN WO CONTRAST  Result Date: 11/19/2023 CLINICAL DATA:  Acute neurologic deficit. Headache and right-sided weakness. EXAM: MRI HEAD WITHOUT CONTRAST TECHNIQUE: Multiplanar, multiecho pulse sequences of the brain and surrounding structures were obtained without intravenous contrast. COMPARISON:  None Available. FINDINGS: Brain: No acute infarct, mass effect or extra-axial collection. No chronic microhemorrhage or siderosis. Normal white matter signal, parenchymal volume and CSF spaces. The midline structures are normal. Vascular: Normal flow voids. Skull and upper cervical spine: Normal marrow signal. Sinuses/Orbits: Negative. Other: None. IMPRESSION: Normal brain MRI. Electronically Signed   By: Deatra Robinson M.D.   On: 11/19/2023 02:43   US Abdomen Limited RUQ (LIVER/GB)  Result Date: 11/18/2023 CLINICAL DATA:  Right upper quadrant abdominal tenderness. EXAM: ULTRASOUND ABDOMEN LIMITED RIGHT UPPER QUADRANT COMPARISON:  None Available. FINDINGS: Gallbladder: No gallstones or wall thickening visualized. No sonographic Murphy sign noted by sonographer. Common bile duct:  Diameter: 2.5 mm Liver: No focal lesion identified. Within normal limits in parenchymal echogenicity. Portal vein is patent on color Doppler imaging with normal direction of blood flow towards the liver. Other: None. IMPRESSION: Normal right upper quadrant ultrasound. Electronically Signed   By: Signa Kell M.D.   On: 11/18/2023 20:26      LOS: 0 days    Jacquelin Hawking, MD [facility] 11/20/2023, 11:24 AM   If 7PM-7AM, please contact night-coverage [email or web address]

## 2023-11-20 NOTE — Plan of Care (Signed)
  Problem: Education: Goal: Knowledge of General Education information will improve Description: Including pain rating scale, medication(s)/side effects and non-pharmacologic comfort measures Outcome: Progressing   Problem: Activity: Goal: Risk for activity intolerance will decrease Outcome: Progressing   Problem: Nutrition: Goal: Adequate nutrition will be maintained Outcome: Progressing   Problem: Coping: Goal: Level of anxiety will decrease Outcome: Progressing   Problem: Safety: Goal: Ability to remain free from injury will improve Outcome: Progressing   Problem: Pain Management: Goal: General experience of comfort will improve Outcome: Progressing

## 2023-11-20 NOTE — Plan of Care (Signed)
  Problem: Ischemic Stroke/TIA Tissue Perfusion: Goal: Complications of ischemic stroke/TIA will be minimized 11/20/2023 0138 by Lanelle Bal, RN Outcome: Progressing 11/20/2023 0136 by Lanelle Bal, RN Outcome: Progressing   Problem: Education: Goal: Knowledge of disease or condition will improve 11/20/2023 0138 by Lanelle Bal, RN Outcome: Progressing 11/20/2023 0136 by Lanelle Bal, RN Outcome: Progressing Goal: Knowledge of secondary prevention will improve (MUST DOCUMENT ALL) 11/20/2023 0138 by Lanelle Bal, RN Outcome: Progressing 11/20/2023 0136 by Lanelle Bal, RN Outcome: Progressing Goal: Knowledge of patient specific risk factors will improve Loraine Leriche N/A or DELETE if not current risk factor) 11/20/2023 0138 by Lanelle Bal, RN Outcome: Progressing 11/20/2023 0136 by Lanelle Bal, RN Outcome: Progressing   Problem: Tissue Perfusion: Goal: Adequacy of tissue perfusion will improve 11/20/2023 0138 by Lanelle Bal, RN Outcome: Progressing 11/20/2023 0136 by Lanelle Bal, RN Outcome: Progressing

## 2023-11-20 NOTE — Plan of Care (Signed)

## 2023-11-21 ENCOUNTER — Inpatient Hospital Stay (HOSPITAL_COMMUNITY): Payer: 59

## 2023-11-21 ENCOUNTER — Inpatient Hospital Stay (HOSPITAL_COMMUNITY): Payer: 59 | Admitting: Registered Nurse

## 2023-11-21 ENCOUNTER — Encounter (HOSPITAL_COMMUNITY)
Admission: EM | Disposition: A | Discharge status: Home / Self Care | Payer: Self-pay | Source: Home / Self Care | Attending: Family Medicine

## 2023-11-21 ENCOUNTER — Encounter (HOSPITAL_COMMUNITY): Payer: Self-pay | Admitting: Internal Medicine

## 2023-11-21 DIAGNOSIS — R1013 Epigastric pain: Secondary | ICD-10-CM

## 2023-11-21 DIAGNOSIS — R531 Weakness: Secondary | ICD-10-CM | POA: Diagnosis not present

## 2023-11-21 DIAGNOSIS — R1310 Dysphagia, unspecified: Secondary | ICD-10-CM

## 2023-11-21 DIAGNOSIS — R109 Unspecified abdominal pain: Secondary | ICD-10-CM | POA: Diagnosis not present

## 2023-11-21 DIAGNOSIS — M5481 Occipital neuralgia: Secondary | ICD-10-CM | POA: Insufficient documentation

## 2023-11-21 DIAGNOSIS — K5710 Diverticulosis of small intestine without perforation or abscess without bleeding: Secondary | ICD-10-CM

## 2023-11-21 DIAGNOSIS — R112 Nausea with vomiting, unspecified: Secondary | ICD-10-CM | POA: Insufficient documentation

## 2023-11-21 HISTORY — PX: ESOPHAGOGASTRODUODENOSCOPY (EGD) WITH PROPOFOL: SHX5813

## 2023-11-21 LAB — GLUCOSE, CAPILLARY
Glucose-Capillary: 141 mg/dL — ABNORMAL HIGH (ref 70–99)
Glucose-Capillary: 120 mg/dL — ABNORMAL HIGH (ref 70–99)
Glucose-Capillary: 203 mg/dL — ABNORMAL HIGH (ref 70–99)
Glucose-Capillary: 114 mg/dL — ABNORMAL HIGH (ref 70–99)

## 2023-11-21 LAB — CBC
HCT: 43.4 % (ref 36.0–46.0)
Hemoglobin: 13.7 g/dL (ref 12.0–15.0)
MCH: 26.7 pg (ref 26.0–34.0)
MCHC: 31.6 g/dL (ref 30.0–36.0)
MCV: 84.4 fL (ref 80.0–100.0)
Platelets: 317 10*3/uL (ref 150–400)
RBC: 5.14 MIL/uL — ABNORMAL HIGH (ref 3.87–5.11)
RDW: 13.1 % (ref 11.5–15.5)
WBC: 9.2 10*3/uL (ref 4.0–10.5)
nRBC: 0 % (ref 0.0–0.2)

## 2023-11-21 LAB — BASIC METABOLIC PANEL
Anion gap: 10 (ref 5–15)
BUN: 13 mg/dL (ref 6–20)
CO2: 23 mmol/L (ref 22–32)
Calcium: 9.7 mg/dL (ref 8.9–10.3)
Chloride: 104 mmol/L (ref 98–111)
Creatinine, Ser: 0.92 mg/dL (ref 0.44–1.00)
GFR, Estimated: >60 mL/min (ref >60)
Glucose, Bld: 144 mg/dL — ABNORMAL HIGH (ref 70–99)
Potassium: 4 mmol/L (ref 3.5–5.1)
Sodium: 137 mmol/L (ref 135–145)

## 2023-11-21 SURGERY — ESOPHAGOGASTRODUODENOSCOPY (EGD) WITH PROPOFOL
Anesthesia: Monitor Anesthesia Care

## 2023-11-21 MED ORDER — PANTOPRAZOLE SODIUM 40 MG PO TBEC: 40.0000 mg | DELAYED_RELEASE_TABLET | Freq: Every day | ORAL | 0 refills | Status: AC

## 2023-11-21 MED ORDER — GLYCOPYRROLATE PF 0.2 MG/ML IJ SOSY
PREFILLED_SYRINGE | INTRAMUSCULAR | Status: DC | PRN
  Administered 2023-11-21: .1 mg via INTRAVENOUS

## 2023-11-21 MED ORDER — SODIUM CHLORIDE 0.45 % IV SOLN: INTRAVENOUS | Status: AC

## 2023-11-21 MED ORDER — SENNOSIDES-DOCUSATE SODIUM 8.6-50 MG PO TABS
1.0000 | ORAL_TABLET | Freq: Two times a day (BID) | ORAL | Status: DC
  Administered 2023-11-21 – 2023-11-22 (×3): 1 via ORAL
  Filled 2023-11-21 (×3): qty 1

## 2023-11-21 MED ORDER — PROMETHAZINE HCL 6.25 MG/5ML PO SOLN
12.5000 mg | Freq: Four times a day (QID) | ORAL | Status: DC | PRN
  Administered 2023-11-21 – 2023-11-22 (×2): 12.5 mg via ORAL
  Filled 2023-11-21 (×3): qty 10

## 2023-11-21 MED ORDER — POLYETHYLENE GLYCOL 3350 17 G PO PACK
17.0000 g | PACK | Freq: Two times a day (BID) | ORAL | Status: DC
  Administered 2023-11-22: 17 g via ORAL
  Filled 2023-11-21 (×2): qty 1

## 2023-11-21 MED ORDER — HYDROMORPHONE HCL 1 MG/ML IJ SOLN
1.0000 mg | INTRAMUSCULAR | Status: DC | PRN
  Administered 2023-11-21 – 2023-11-22 (×4): 1 mg via INTRAVENOUS
  Filled 2023-11-21 (×5): qty 1

## 2023-11-21 MED ORDER — PROPOFOL 10 MG/ML IV BOLUS
INTRAVENOUS | Status: DC | PRN
  Administered 2023-11-21: 30 mg via INTRAVENOUS
  Administered 2023-11-21: 20 mg via INTRAVENOUS
  Administered 2023-11-21: 50 mg via INTRAVENOUS
  Administered 2023-11-21: 40 mg via INTRAVENOUS

## 2023-11-21 MED ORDER — PROPOFOL 500 MG/50ML IV EMUL
INTRAVENOUS | Status: DC | PRN
  Administered 2023-11-21: 150 ug/kg/min via INTRAVENOUS

## 2023-11-21 SURGICAL SUPPLY — 14 items

## 2023-11-21 NOTE — Progress Notes (Signed)
PROGRESS NOTE    Donna [name]  JWJ:[id number] DOB: [birth year] DOA: 11/17/2023 PCP: Fleet Contras, MD   Brief Narrative: Donna [name] is a 52 y.o. female with a history of diabetes mellitus type 2, seizures, SLE, migraine.  Patient presented secondary to persistent right-sided headache with right-sided upper and lower extremity weakness.  There was initial concern for possible stroke versus complicated migraine.  CT and MRI imaging were negative for acute stroke.  Neurology was consulted with concern for possible functional weakness of the upper and lower extremities in addition to possible occipital neuralgia/medication overuse headache treated with a right occipital nerve block.  During hospitalization, patient was also noted to have epigastric pain with CT evidence of mild duodenitis.  Patient unable to tolerate oral diet so GI was consulted for evaluation.  Patient underwent an upper endoscopy which was significant for a duodenal diverticulum which identifies the abnormality seen on CT imaging.  Recommendation for Protonix on discharge.   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 Neurology performed a right occipital nerve block on 12/6 with improvement in pain.  Right-sided limb weakness Unclear etiology, but appears to be functional and triggered by headache. Stroke workup was negative for etiology. Psychiatry consulted and declined inpatient evaluation; recommending outpatient follow-up. PT and OT recommending outpatient PT/OT. Weakness has improved, per patient, after nerve block. Recommendation for neurology follow-up and psychiatry follow-up on discharge.  Epigastric pain Duodenal diverticulum Lipase, LFTs normal. CT abdomen/pelvis significant for mildly thickened proximal duodenum consistent with duodenitis.  CT without evidence of acute  pancreatitis, cholecystitis, biliary tree dilation.  Patient unable to tolerate oral intake, however. Continued abdominal pain. GI Consulted and performed an upper GI endoscopy on 12/8 revealing a duodenal diverticulum. Recommendation for Protonix 40 mg daily for 30 days and GI follow-up as needed. Prior to discharge, patient was unable to tolerate diet and had recurrent emesis. -Protonix -Zofran PRN  Diabetes mellitus, type 2 Uncontrolled with hemoglobin A1C of 9.6%. Patient is on Trulicity as an outpatient but has ran out recently. Patient started on Semglee and SSI on admission. Patient declines change in therapy as an outpatient and will follow-up with her PCP. -Continue Semglee and SSI while inpatient.  SLE Patient is on prednisone as needed for flare ups.  History of seizures Noted. Not on AEDs.   DVT prophylaxis: Lovenox Code Status:   Code Status: Full Code Family Communication: Daughter at bedside Disposition Plan: Discharge pending ability to tolerate oral intake   Consultants:  Neurology Gastroenterology  Procedures:  Right occipital nerve block Upper GI endoscopy  Antimicrobials: None    Subjective: Patient feeling good from headache. She was feeling good from a GI standpoint however after attempting to eat lunch, had an episode of emesis.  Objective: BP (!) 134/92 (BP Location: Left Arm)   Pulse 93   Temp 97.9 F (36.6 C) (Oral)   Resp 17   Ht 5' (1.524 m)   Wt 72.6 kg   LMP 06/14/2003   SpO2 98%   BMI 31.25 kg/m   Examination:  General exam: Appears calm and comfortable Respiratory system: Clear to auscultation. Respiratory effort normal. Cardiovascular system: S1 & S2 heard, RRR. Gastrointestinal system: Abdomen is nondistended, soft and mildly tender. No organomegaly or masses felt. Normal bowel sounds heard. Central nervous system: Alert and oriented. Psychiatry: Judgement and insight appear normal. Mood & affect appropriate.    Data  Reviewed:  I have personally reviewed following labs and imaging studies  CBC Lab Results  Component Value Date   WBC 9.2 11/21/2023   RBC 5.14 (H) 11/21/2023   HGB 13.7 11/21/2023   HCT 43.4 11/21/2023   MCV 84.4 11/21/2023   MCH 26.7 11/21/2023   PLT 317 11/21/2023   MCHC 31.6 11/21/2023   RDW 13.1 11/21/2023   LYMPHSABS 2.3 06/09/2023   MONOABS 0.5 06/09/2023   EOSABS 0.1 06/09/2023   BASOSABS 0.1 06/09/2023     Last metabolic panel Lab Results  Component Value Date   NA 137 11/21/2023   K 4.0 11/21/2023   CL 104 11/21/2023   CO2 23 11/21/2023   BUN 13 11/21/2023   CREATININE 0.92 11/21/2023   GLUCOSE 144 (H) 11/21/2023   GFRNONAA >60 11/21/2023   GFRAA >60 08/21/2020   CALCIUM 9.7 11/21/2023   PHOS 4.1 03/11/2018   PROT 8.2 (H) 11/18/2023   ALBUMIN 4.7 11/18/2023   LABGLOB 2.7 07/08/2022   AGRATIO 1.7 07/08/2022   BILITOT 0.4 11/18/2023   ALKPHOS 96 11/18/2023   AST 19 11/18/2023   ALT 18 11/18/2023   ANIONGAP 10 11/21/2023    GFR: Estimated Creatinine Clearance: 62.1 mL/min (by C-G formula based on SCr of 0.92 mg/dL).  No results found for this or any previous visit (from the past 240 hour(s)).    Radiology Studies: DG Abd 1 View  Result Date: 11/21/2023 CLINICAL DATA:  Abdominal pain EXAM: ABDOMEN - 1 VIEW COMPARISON:  11/20/2023 FINDINGS: Nonobstructive bowel gas pattern. No organomegaly or free air. No suspicious calcification. IMPRESSION: No acute findings. Electronically Signed   By: Charlett Nose M.D.   On: 11/21/2023 02:14   DG Abd Portable 1V  Result Date: 11/20/2023 CLINICAL DATA:  Abdominal pain with nausea and guarding. EXAM: PORTABLE ABDOMEN - 1 VIEW COMPARISON:  Abdomen and pelvis CT dated 11/18/2023. FINDINGS: Normal bowel-gas pattern with a paucity of intestinal gas. Unremarkable bones. Interval elevation of the right hemidiaphragm. IMPRESSION: 1. Normal bowel-gas pattern with a paucity of intestinal gas. 2. Interval elevation of the right  hemidiaphragm. Electronically Signed   By: Beckie Salts M.D.   On: 11/20/2023 11:43      LOS: 1 day    Jacquelin Hawking, MD [facility] 11/21/2023, 1:58 PM   If 7PM-7AM, please contact night-coverage [email or web address]

## 2023-11-21 NOTE — Progress Notes (Addendum)
  Patient is complaining about generalized abdominal pain 8 out of 10 intensity.  Norco is not helping her.  Denies any nausea, vomiting and has poor oral intake.  Patient denies any nausea vomiting and diarrhea. -Obtaining stat abdominal x-ray - X-ray from 12/5 normal bowel gas pattern. -Gastroenterology has been evaluated patient today as she has persistent abdominal pain.  Plan for upper endoscopy later today. -As abdominal pain is not subsiding with oral Percocet ordered Dilaudid 1 mg every 4 hour as needed for severe pain.  Continue MiraLAX twice daily.  Tereasa Coop, MD [facility] 11/21/2023, 1:57 AM

## 2023-11-21 NOTE — Plan of Care (Signed)

## 2023-11-21 NOTE — Discharge Summary (Deleted)
Physician Discharge Summary   Patient: Donna [name] MRN: [id number] DOB: 1971-12-12  Admit date:     11/17/2023  Discharge date: 11/21/23  Discharge Physician: Jacquelin Hawking, MD   PCP: Fleet Contras, MD   Recommendations at discharge:  PCP visit for hospital follow-up Neurology visit for right sided weakness with concern for functional weakness GI follow-up as needed Psychiatry follow-up for concern for conversion disorder  Discharge Diagnoses: Principal Problem:   Right sided weakness Active Problems:   Epigastric pain   Duodenal diverticulum   Occipital neuralgia of right side  Resolved Problems:   * No resolved hospital problems. *  Hospital Course: Donna [name] is a 52 y.o. female with a history of diabetes mellitus type 2, seizures, SLE, migraine.  Patient presented secondary to persistent right-sided headache with right-sided upper and lower extremity weakness.  There was initial concern for possible stroke versus complicated migraine.  CT and MRI imaging were negative for acute stroke.  Neurology was consulted with concern for possible functional weakness of the upper and lower extremities in addition to possible occipital neuralgia/medication overuse headache treated with a right occipital nerve block.  During hospitalization, patient was also noted to have epigastric pain with CT evidence of mild duodenitis.  Patient unable to tolerate oral diet so GI was consulted for evaluation.  Patient underwent an upper endoscopy which was significant for a duodenal diverticulum which identifies the abnormality seen on CT imaging.  Recommendation for Protonix on discharge.  Assessment and Plan:  Occipital neuralgia of right side Concern for possible migraine, however neurology has evaluated and suspecting medication overuse headache with a component of occipital neuralgia. Neurology performed a right occipital nerve block on 12/6 with improvement in pain.   Right-sided limb  weakness Unclear etiology, but appears to be functional and triggered by headache. Stroke workup was negative for etiology. Psychiatry consulted and declined inpatient evaluation; recommending outpatient follow-up. PT and OT recommending outpatient PT/OT. Weakness has improved, per patient, after nerve block. Recommendation for neurology follow-up and psychiatry follow-up on discharge.   Epigastric pain Duodenal diverticulum Lipase, LFTs normal. CT abdomen/pelvis significant for mildly thickened proximal duodenum consistent with duodenitis.  CT without evidence of acute pancreatitis, cholecystitis, biliary tree dilation.  Patient unable to tolerate oral intake, however. Continued abdominal pain. GI Consulted and performed an upper GI endoscopy on 12/8 revealing a duodenal diverticulum. Recommendation for Protonix 40 mg daily for 30 days and GI follow-up as needed.   Diabetes mellitus, type 2 Uncontrolled with hemoglobin A1C of 9.6%. Patient is on Trulicity as an outpatient but has ran out recently. Patient started on Semglee and SSI on admission. Patient declines change in therapy as an outpatient and will follow-up with her PCP.   SLE Patient is on prednisone as needed for flare ups.   History of seizures Noted. Not on AEDs.   Consultants:  Neurology Gastroenterology   Procedures:  Right occipital nerve block Upper endoscopy  Disposition: Home Diet recommendation: Carb modified diet   DISCHARGE MEDICATION: Allergies as of 11/21/2023       Reactions   Bee Venom Anaphylaxis   Diamox [acetazolamide] Anaphylaxis   Nsaids Anaphylaxis   Omnipaque [iohexol] Anaphylaxis   Tolmetin Anaphylaxis   Compazine [prochlorperazine] Hives   Iopamidol Hives   Lactose Intolerance (gi) Nausea And Vomiting   Reglan [metoclopramide] Hives        Medication List     STOP taking these medications    CORICIDIN D PO       TAKE  these medications    acetaminophen 500 MG tablet Commonly  known as: TYLENOL Take 500 mg by mouth every 6 (six) hours as needed for moderate pain.   ASHWAGANDHA PO Take 1 tablet by mouth daily.   cholecalciferol 1000 units tablet Commonly known as: VITAMIN D Take 1,000 Units by mouth daily.   cyanocobalamin 1000 MCG tablet Commonly known as: VITAMIN B12 Take 1,000 mcg by mouth daily.   EPINEPHrine 0.3 mg/0.3 mL Soaj injection Commonly known as: EpiPen 2-Pak Inject 0.3 mLs (0.3 mg total) into the muscle once as needed (for severe allergic reaction).   pantoprazole 40 MG tablet Commonly known as: Protonix Take 1 tablet (40 mg total) by mouth daily.   Trulicity 0.75 MG/0.5ML Soaj Generic drug: Dulaglutide Inject 0.75 mg into the skin once a week.               Durable Medical Equipment  (From admission, onward)           Start     Ordered   11/19/23 1154  For home use only DME Walker rolling  Once       Question Answer Comment  Walker: With 5 Inch Wheels   Patient needs a walker to treat with the following condition Weakness      11/19/23 1153            Follow-up Information     [facility]. Schedule an appointment as soon as possible for a visit.   Specialty: Rehabilitation Contact information: [address] [place] [address] [phone]        [facility]. Call.   Why: Psychiatry and therapy services  Please have the patient or guardian call us at [phone] between 8am - 3pm Monday - Friday to complete initial registration and assessment virtuall through our open access process. The patient can also walk-in to our nearest behavioral health outpatient office if preferred. If walking in, note that our offices are closed for lunch from 12-1pm. Please share the following with the patient when instructing them to call/walk-in to [facility]  Have ID and insurance card available Must be physically located in [place] when receiving virtual services If  the patient has a legal guardian (parent or court-appointed), the guardian must be present to sign consents to treat Contact information: [address]  [address] [place] [address] [phone]                Discharge Exam: BP (!) 134/92 (BP Location: Left Arm)   Pulse 93   Temp 97.9 F (36.6 C) (Oral)   Resp 17   Ht 5' (1.524 m)   Wt 72.6 kg   LMP 06/14/2003   SpO2 98%   BMI 31.25 kg/m   General exam: Appears calm and comfortable Respiratory system: Clear to auscultation. Respiratory effort normal. Cardiovascular system: S1 & S2 heard, RRR. No murmurs, rubs, gallops or clicks. Gastrointestinal system: Abdomen is nondistended, soft and tender. Normal bowel sounds heard. Central nervous system: Alert and oriented. No focal neurological deficits. Psychiatry: Judgement and insight appear normal. Mood & affect appropriate.   Condition at discharge: stable  The results of significant diagnostics from this hospitalization (including imaging, microbiology, ancillary and laboratory) are listed below for reference.   Imaging Studies: DG Abd 1 View  Result Date: 11/21/2023 CLINICAL DATA:  Abdominal pain EXAM: ABDOMEN - 1 VIEW COMPARISON:  11/20/2023 FINDINGS: Nonobstructive bowel gas pattern. No organomegaly or free air. No suspicious calcification. IMPRESSION:  No acute findings. Electronically Signed   By: Charlett Nose M.D.   On: 11/21/2023 02:14   DG Abd Portable 1V  Result Date: 11/20/2023 CLINICAL DATA:  Abdominal pain with nausea and guarding. EXAM: PORTABLE ABDOMEN - 1 VIEW COMPARISON:  Abdomen and pelvis CT dated 11/18/2023. FINDINGS: Normal bowel-gas pattern with a paucity of intestinal gas. Unremarkable bones. Interval elevation of the right hemidiaphragm. IMPRESSION: 1. Normal bowel-gas pattern with a paucity of intestinal gas. 2. Interval elevation of the right hemidiaphragm. Electronically Signed   By: Beckie Salts M.D.   On: 11/20/2023 11:43   MR BRAIN WO  CONTRAST  Result Date: 11/19/2023 CLINICAL DATA:  Acute neurologic deficit. Headache and right-sided weakness. EXAM: MRI HEAD WITHOUT CONTRAST TECHNIQUE: Multiplanar, multiecho pulse sequences of the brain and surrounding structures were obtained without intravenous contrast. COMPARISON:  None Available. FINDINGS: Brain: No acute infarct, mass effect or extra-axial collection. No chronic microhemorrhage or siderosis. Normal white matter signal, parenchymal volume and CSF spaces. The midline structures are normal. Vascular: Normal flow voids. Skull and upper cervical spine: Normal marrow signal. Sinuses/Orbits: Negative. Other: None. IMPRESSION: Normal brain MRI. Electronically Signed   By: Deatra Robinson M.D.   On: 11/19/2023 02:43   US Abdomen Limited RUQ (LIVER/GB)  Result Date: 11/18/2023 CLINICAL DATA:  Right upper quadrant abdominal tenderness. EXAM: ULTRASOUND ABDOMEN LIMITED RIGHT UPPER QUADRANT COMPARISON:  None Available. FINDINGS: Gallbladder: No gallstones or wall thickening visualized. No sonographic Murphy sign noted by sonographer. Common bile duct: Diameter: 2.5 mm Liver: No focal lesion identified. Within normal limits in parenchymal echogenicity. Portal vein is patent on color Doppler imaging with normal direction of blood flow towards the liver. Other: None. IMPRESSION: Normal right upper quadrant ultrasound. Electronically Signed   By: Signa Kell M.D.   On: 11/18/2023 20:26   CT ABDOMEN PELVIS WO CONTRAST  Result Date: 11/18/2023 CLINICAL DATA:  Right upper quadrant pain. EXAM: CT ABDOMEN AND PELVIS WITHOUT CONTRAST TECHNIQUE: Multidetector CT imaging of the abdomen and pelvis was performed following the standard protocol without IV contrast. RADIATION DOSE REDUCTION: This exam was performed according to the departmental dose-optimization program which includes automated exposure control, adjustment of the mA and/or kV according to patient size and/or use of iterative reconstruction  technique. COMPARISON:  June 09, 2023 FINDINGS: Lower chest: No acute abnormality. Hepatobiliary: No focal liver abnormality is seen. No gallstones, gallbladder wall thickening, or biliary dilatation. Pancreas: Unremarkable. No pancreatic ductal dilatation or surrounding inflammatory changes. Spleen: Normal in size without focal abnormality. Adrenals/Urinary Tract: Adrenal glands are unremarkable. Kidneys are normal in size, without renal calculi or hydronephrosis. A stable 5.7 cm diameter cyst is seen within the mid left kidney. Bladder is unremarkable. Stomach/Bowel: Stomach is within normal limits. Appendix appears normal. No evidence of bowel dilatation. A short segment of very mildly thickened proximal duodenum is noted (axial CT images 30 through 37, CT series 2). Vascular/Lymphatic: No significant vascular findings are present. No enlarged abdominal or pelvic lymph nodes. Reproductive: Status post hysterectomy. No adnexal masses. Other: No abdominal wall hernia or abnormality. No abdominopelvic ascites. Musculoskeletal: No acute or significant osseous findings. IMPRESSION: 1. Short segment of very mildly thickened proximal duodenum which may represent mild duodenitis. 2. Stable left renal cyst. No follow-up imaging is recommended. This recommendation follows ACR consensus guidelines: Management of the Incidental Renal Mass on CT: A White Paper of the ACR Incidental Findings Committee. J Am Coll Radiol [phone]. Electronically Signed   By: Aram Candela M.D.   On:  11/18/2023 01:58   CT Head Wo Contrast  Result Date: 11/17/2023 CLINICAL DATA:  Headache, neuro deficit EXAM: CT HEAD WITHOUT CONTRAST TECHNIQUE: Contiguous axial images were obtained from the base of the skull through the vertex without intravenous contrast. RADIATION DOSE REDUCTION: This exam was performed according to the departmental dose-optimization program which includes automated exposure control, adjustment of the mA and/or kV  according to patient size and/or use of iterative reconstruction technique. COMPARISON:  CT head 08/21/2020 FINDINGS: Brain: No evidence of large-territorial acute infarction. No parenchymal hemorrhage. No mass lesion. No extra-axial collection. No mass effect or midline shift. No hydrocephalus. Basilar cisterns are patent. Vascular: No hyperdense vessel. Skull: No acute fracture or focal lesion. Sinuses/Orbits: Paranasal sinuses and mastoid air cells are clear. The orbits are unremarkable. Other: None. IMPRESSION: No acute intracranial abnormality. Electronically Signed   By: Tish Frederickson M.D.   On: 11/17/2023 23:48    Microbiology: Results for orders placed or performed during the hospital encounter of 05/03/22  Resp Panel by RT-PCR (Flu A&B, Covid) Nasopharyngeal Swab     Status: None   Collection Time: 05/03/22  1:01 PM   Specimen: Nasopharyngeal Swab; Nasopharyngeal(NP) swabs in vial transport medium  Result Value Ref Range Status   SARS Coronavirus 2 by RT PCR NEGATIVE NEGATIVE Final    Comment: (NOTE) SARS-CoV-2 target nucleic acids are NOT DETECTED.  The SARS-CoV-2 RNA is generally detectable in upper respiratory specimens during the acute phase of infection. The lowest concentration of SARS-CoV-2 viral copies this assay can detect is 138 copies/mL. A negative result does not preclude SARS-Cov-2 infection and should not be used as the sole basis for treatment or other patient management decisions. A negative result may occur with  improper specimen collection/handling, submission of specimen other than nasopharyngeal swab, presence of viral mutation(s) within the areas targeted by this assay, and inadequate number of viral copies(<138 copies/mL). A negative result must be combined with clinical observations, patient history, and epidemiological information. The expected result is Negative.  Fact Sheet for Patients:  BloggerCourse.com  Fact Sheet for  Healthcare Providers:  SeriousBroker.it  This test is no t yet approved or cleared by the [place] FDA and  has been authorized for detection and/or diagnosis of SARS-CoV-2 by FDA under an Emergency Use Authorization (EUA). This EUA will remain  in effect (meaning this test can be used) for the duration of the COVID-19 declaration under Section 564(b)(1) of the Act, 21 U.S.C.section 360bbb-3(b)(1), unless the authorization is terminated  or revoked sooner.       Influenza A by PCR NEGATIVE NEGATIVE Final   Influenza B by PCR NEGATIVE NEGATIVE Final    Comment: (NOTE) The Xpert Xpress SARS-CoV-2/FLU/RSV plus assay is intended as an aid in the diagnosis of influenza from Nasopharyngeal swab specimens and should not be used as a sole basis for treatment. Nasal washings and aspirates are unacceptable for Xpert Xpress SARS-CoV-2/FLU/RSV testing.  Fact Sheet for Patients: BloggerCourse.com  Fact Sheet for Healthcare Providers: SeriousBroker.it  This test is not yet approved or cleared by the [place] FDA and has been authorized for detection and/or diagnosis of SARS-CoV-2 by FDA under an Emergency Use Authorization (EUA). This EUA will remain in effect (meaning this test can be used) for the duration of the COVID-19 declaration under Section 564(b)(1) of the Act, 21 U.S.C. section 360bbb-3(b)(1), unless the authorization is terminated or revoked.  Performed at [facility], [address], [place], [place] [address]   Urine Culture  Status: Abnormal   Collection Time: 05/03/22  6:54 PM   Specimen: Urine, Clean Catch  Result Value Ref Range Status   Specimen Description   Final    URINE, CLEAN CATCH Performed at [facility], [address] [place], [place] [address]    Special Requests   Final    NONE Performed at [facility],  [address] [place], [place] [address]    Culture (A)  Final    >=100,000 COLONIES/mL LACTOBACILLUS SPECIES Standardized susceptibility testing for this organism is not available. Performed at [facility], [address], [place], [place] [address]    Report Status 05/05/2022 FINAL  Final    Labs: CBC: Recent Labs  Lab 11/17/23 2255 11/19/23 0449 11/21/23 0639  WBC 7.3 13.1* 9.2  HGB 12.9 12.0 13.7  HCT 40.5 37.6 43.4  MCV 83.9 82.6 84.4  PLT 317 349 317   Basic Metabolic Panel: Recent Labs  Lab 11/18/23 0023 11/19/23 0449  NA 138 135  K 3.8 3.5  CL 101 101  CO2 28 24  GLUCOSE 180* 252*  BUN 18 19  CREATININE 0.86 0.91  CALCIUM 10.4* 9.6   Liver Function Tests: Recent Labs  Lab 11/18/23 0023  AST 19  ALT 18  ALKPHOS 96  BILITOT 0.4  PROT 8.2*  ALBUMIN 4.7   CBG: Recent Labs  Lab 11/20/23 0810 11/20/23 1304 11/20/23 1632 11/20/23 2133 11/21/23 0624  GLUCAP 169* 138* 160* 186* 141*    Discharge time spent: 35 minutes.  Signed: Jacquelin Hawking, MD [facility] 11/21/2023

## 2023-11-21 NOTE — Transfer of Care (Signed)
Immediate Anesthesia Transfer of Care Note  Patient: [facility]  Procedure(s) Performed: ESOPHAGOGASTRODUODENOSCOPY (EGD) WITH PROPOFOL  Patient Location: PACU  Anesthesia Type:MAC  Level of Consciousness: drowsy and responds to stimulation  Airway & Oxygen Therapy: Patient Spontanous Breathing  Post-op Assessment: Report given to RN and Post -op Vital signs reviewed and stable  Post vital signs: Reviewed and stable  Last Vitals:  Vitals Value Taken Time  BP 133/97 11/21/23 1146  Temp 36.2 C 11/21/23 1145  Pulse 124 11/21/23 1149  Resp 16 11/21/23 1149  SpO2 95 % 11/21/23 1149  Vitals shown include unfiled device data.  Last Pain:  Vitals:   11/21/23 0958  TempSrc: Temporal  PainSc: 4       Patients Stated Pain Goal: 3 (11/21/23 0207)  Complications: No notable events documented.

## 2023-11-21 NOTE — Progress Notes (Addendum)
Pt c/o pain after eating almost 100% of [employer] beef lunch. 6/10. Went to get pain meds and pt called and said she vomited. Observed pt as she was hung over the trash can with vomit stringing from mouth she was in pain breathing heavy from the heaving her HR escalated to 150 as CCMD called to report. Pt was given PRN zofran and img Dilaudid MD informed

## 2023-11-21 NOTE — Anesthesia Preprocedure Evaluation (Signed)
Anesthesia Evaluation  Patient identified by MRN, date of birth, ID band Patient awake    Reviewed: Allergy & Precautions, NPO status , Patient's Chart, lab work & pertinent test results  Airway Mallampati: II  TM Distance: >3 FB Neck ROM: Full    Dental  (+) Teeth Intact, Dental Advisory Given   Pulmonary neg pulmonary ROS   Pulmonary exam normal breath sounds clear to auscultation       Cardiovascular +CHF and + DVT  Normal cardiovascular exam Rhythm:Regular Rate:Normal     Neuro/Psych  Headaches, Seizures -,  PSYCHIATRIC DISORDERS Anxiety Depression    TIA   GI/Hepatic Neg liver ROS,,,RUQ pain, epigastric pain    Endo/Other    Class 3 obesityLupus  Renal/GU negative Renal ROS     Musculoskeletal  (+) Arthritis ,    Abdominal   Peds  Hematology negative hematology ROS (+)   Anesthesia Other Findings Day of surgery medications reviewed with the patient.  Reproductive/Obstetrics                             Anesthesia Physical Anesthesia Plan  ASA: 2  Anesthesia Plan: MAC   Post-op Pain Management:    Induction: Intravenous  PONV Risk Score and Plan: 2 and TIVA  Airway Management Planned: Natural Airway and Simple Face Mask  Additional Equipment:   Intra-op Plan:   Post-operative Plan:   Informed Consent: I have reviewed the patients History and Physical, chart, labs and discussed the procedure including the risks, benefits and alternatives for the proposed anesthesia with the patient or authorized representative who has indicated his/her understanding and acceptance.     Dental advisory given  Plan Discussed with: CRNA and Anesthesiologist  Anesthesia Plan Comments:        Anesthesia Quick Evaluation

## 2023-11-21 NOTE — Anesthesia Postprocedure Evaluation (Signed)
Anesthesia Post Note  Patient: Donna [name]  Procedure(s) Performed: ESOPHAGOGASTRODUODENOSCOPY (EGD) WITH PROPOFOL     Patient location during evaluation: PACU Anesthesia Type: MAC Level of consciousness: awake and alert Pain management: pain level controlled Vital Signs Assessment: post-procedure vital signs reviewed and stable Respiratory status: spontaneous breathing, nonlabored ventilation, respiratory function stable and patient connected to nasal cannula oxygen Cardiovascular status: stable and blood pressure returned to baseline Postop Assessment: no apparent nausea or vomiting Anesthetic complications: no   No notable events documented.  Last Vitals:    Last Pain:           Collene Schlichter

## 2023-11-21 NOTE — Op Note (Signed)
[facility] Patient Name: Donna [name] Procedure Date : 11/21/2023 MRN: [id number] Attending MD: Wilhemina Bonito. Marina Goodell , MD, [id number] Date of Birth: 08-25-1971 CSN: [id number] Age: 52 Admit Type: Inpatient Procedure:                Upper GI endoscopy Indications:              Abdominal pain, Abnormal CT of the GI tract                            (question of duodenitis) Providers:                Wilhemina Bonito. Marina Goodell, MD, Eliberto Ivory, RN, Salley Scarlet,                            Technician, ROSE G, CRNA Referring MD:             [facility] hospitalist Medicines:                Monitored Anesthesia Care Complications:            No immediate complications. Estimated Blood Loss:     Estimated blood loss: none. Procedure:                Pre-Anesthesia Assessment:                           - Prior to the procedure, a History and Physical                            was performed, and patient medications and                            allergies were reviewed. The patient's tolerance of                            previous anesthesia was also reviewed. The risks                            and benefits of the procedure and the sedation                            options and risks were discussed with the patient.                            All questions were answered, and informed consent                            was obtained. Prior Anticoagulants: The patient has                            taken no anticoagulant or antiplatelet agents. ASA                            Grade Assessment: II - A patient with mild systemic  disease. After reviewing the risks and benefits,                            the patient was deemed in satisfactory condition to                            undergo the procedure.                           After obtaining informed consent, the endoscope was                            passed under direct vision. Throughout the                             procedure, the patient's blood pressure, pulse, and                            oxygen saturations were monitored continuously. The                            [id number]-H190 ([id number]) Olympus endoscope was introduced                            through the mouth, and advanced to the second part                            of duodenum. The upper GI endoscopy was                            accomplished without difficulty. The patient                            tolerated the procedure well. Scope In: Scope Out: Findings:      The esophagus was normal.      The stomach was normal.      The examined duodenum was normal, save periampullary diverticulum.      The cardia and gastric fundus were normal on retroflexion. Impression:               1. Normal EGD.                           2. 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 Regular diet                           2. Discharge home on pantoprazole 40 mg p.o. daily                            for 1 month  3. She can return to the care of her PCP                           4. Outpatient GI follow-up with Dr. Rhea Belton as                            needed.                           Discussed with patient. I provided her a copy of                            this report. Will sign off. Procedure Code(s):        --- Professional ---                           [phone], Esophagogastroduodenoscopy, flexible,                            transoral; diagnostic, including collection of                            specimen(s) by brushing or washing, when performed                            (separate procedure) Diagnosis Code(s):        --- Professional ---                           R10.9, Unspecified abdominal pain                           R93.3, Abnormal findings on diagnostic imaging of                            other parts of digestive tract CPT copyright 2022  [employer]. All rights reserved. The codes documented in this report are preliminary and upon coder review may  be revised to meet current compliance requirements. Wilhemina Bonito. Marina Goodell, MD 11/21/2023 11:43:44 AM This report has been signed electronically. Number of Addenda: 0

## 2023-11-22 ENCOUNTER — Telehealth (HOSPITAL_COMMUNITY): Payer: Self-pay | Admitting: Pharmacy Technician

## 2023-11-22 ENCOUNTER — Other Ambulatory Visit (HOSPITAL_COMMUNITY): Payer: Self-pay

## 2023-11-22 DIAGNOSIS — R1013 Epigastric pain: Secondary | ICD-10-CM

## 2023-11-22 DIAGNOSIS — R112 Nausea with vomiting, unspecified: Secondary | ICD-10-CM

## 2023-11-22 DIAGNOSIS — R531 Weakness: Secondary | ICD-10-CM | POA: Diagnosis not present

## 2023-11-22 LAB — GLUCOSE, CAPILLARY
Glucose-Capillary: 114 mg/dL — ABNORMAL HIGH (ref 70–99)
Glucose-Capillary: 120 mg/dL — ABNORMAL HIGH (ref 70–99)
Glucose-Capillary: 122 mg/dL — ABNORMAL HIGH (ref 70–99)
Glucose-Capillary: 163 mg/dL — ABNORMAL HIGH (ref 70–99)

## 2023-11-22 MED ORDER — ONDANSETRON 4 MG PO TBDP: 4.0000 mg | ORAL_TABLET | Freq: Three times a day (TID) | ORAL | 0 refills | Status: AC | PRN

## 2023-11-22 NOTE — Plan of Care (Signed)
  Problem: Education: Goal: Knowledge of General Education information will improve Description: Including pain rating scale, medication(s)/side effects and non-pharmacologic comfort measures Outcome: Progressing   Problem: Health Behavior/Discharge Planning: Goal: Ability to manage health-related needs will improve Outcome: Progressing   Problem: Clinical Measurements: Goal: Ability to maintain clinical measurements within normal limits will improve Outcome: Progressing Goal: Will remain free from infection Outcome: Progressing Goal: Diagnostic test results will improve Outcome: Progressing Goal: Cardiovascular complication will be avoided Outcome: Progressing   Problem: Activity: Goal: Risk for activity intolerance will decrease Outcome: Progressing   Problem: Nutrition: Goal: Adequate nutrition will be maintained Outcome: Progressing   Problem: Education: Goal: Knowledge of disease or condition will improve Outcome: Progressing Goal: Knowledge of secondary prevention will improve (MUST DOCUMENT ALL) Outcome: Progressing Goal: Knowledge of patient specific risk factors will improve Loraine Leriche N/A or DELETE if not current risk factor) Outcome: Progressing   Problem: Ischemic Stroke/TIA Tissue Perfusion: Goal: Complications of ischemic stroke/TIA will be minimized Outcome: Progressing

## 2023-11-22 NOTE — Plan of Care (Signed)
  Problem: Education: Goal: Knowledge of General Education information will improve Description: Including pain rating scale, medication(s)/side effects and non-pharmacologic comfort measures Outcome: Progressing   Problem: Health Behavior/Discharge Planning: Goal: Ability to manage health-related needs will improve Outcome: Progressing   Problem: Clinical Measurements: Goal: Ability to maintain clinical measurements within normal limits will improve Outcome: Progressing Goal: Will remain free from infection Outcome: Progressing Goal: Diagnostic test results will improve Outcome: Progressing Goal: Respiratory complications will improve Outcome: Progressing Goal: Cardiovascular complication will be avoided Outcome: Progressing   Problem: Activity: Goal: Risk for activity intolerance will decrease Outcome: Progressing   Problem: Nutrition: Goal: Adequate nutrition will be maintained Outcome: Progressing   Problem: Coping: Goal: Level of anxiety will decrease Outcome: Progressing   Problem: Elimination: Goal: Will not experience complications related to bowel motility Outcome: Progressing Goal: Will not experience complications related to urinary retention Outcome: Progressing   Problem: Pain Management: Goal: General experience of comfort will improve Outcome: Progressing   Problem: Safety: Goal: Ability to remain free from injury will improve Outcome: Progressing   Problem: Skin Integrity: Goal: Risk for impaired skin integrity will decrease Outcome: Progressing   Problem: Education: Goal: Ability to describe self-care measures that may prevent or decrease complications (Diabetes Survival Skills Education) will improve Outcome: Progressing Goal: Individualized Educational Video(s) Outcome: Progressing   Problem: Coping: Goal: Ability to adjust to condition or change in health will improve Outcome: Progressing   Problem: Fluid Volume: Goal: Ability to  maintain a balanced intake and output will improve Outcome: Progressing   Problem: Health Behavior/Discharge Planning: Goal: Ability to identify and utilize available resources and services will improve Outcome: Progressing Goal: Ability to manage health-related needs will improve Outcome: Progressing   Problem: Metabolic: Goal: Ability to maintain appropriate glucose levels will improve Outcome: Progressing   Problem: Nutritional: Goal: Maintenance of adequate nutrition will improve Outcome: Progressing Goal: Progress toward achieving an optimal weight will improve Outcome: Progressing   Problem: Skin Integrity: Goal: Risk for impaired skin integrity will decrease Outcome: Progressing   Problem: Tissue Perfusion: Goal: Adequacy of tissue perfusion will improve Outcome: Progressing   Problem: Education: Goal: Knowledge of disease or condition will improve Outcome: Progressing Goal: Knowledge of secondary prevention will improve (MUST DOCUMENT ALL) Outcome: Progressing Goal: Knowledge of patient specific risk factors will improve Loraine Leriche N/A or DELETE if not current risk factor) Outcome: Progressing   Problem: Ischemic Stroke/TIA Tissue Perfusion: Goal: Complications of ischemic stroke/TIA will be minimized Outcome: Progressing   Problem: Coping: Goal: Will verbalize positive feelings about self Outcome: Progressing Goal: Will identify appropriate support needs Outcome: Progressing   Problem: Health Behavior/Discharge Planning: Goal: Ability to manage health-related needs will improve Outcome: Progressing Goal: Goals will be collaboratively established with patient/family Outcome: Progressing   Problem: Self-Care: Goal: Ability to participate in self-care as condition permits will improve Outcome: Progressing Goal: Verbalization of feelings and concerns over difficulty with self-care will improve Outcome: Progressing Goal: Ability to communicate needs accurately  will improve Outcome: Progressing   Problem: Nutrition: Goal: Risk of aspiration will decrease Outcome: Progressing Goal: Dietary intake will improve Outcome: Progressing   Problem: Education: Goal: Knowledge of secondary prevention will improve (MUST DOCUMENT ALL) Outcome: Progressing Goal: Knowledge of patient specific risk factors will improve Loraine Leriche N/A or DELETE if not current risk factor) Outcome: Progressing

## 2023-11-22 NOTE — Telephone Encounter (Signed)
Pharmacy Patient Advocate Encounter  Received notification from [facility] that Prior Authorization for Trulicity 0.75MG /0.5ML auto-injectors has been APPROVED from 11/22/2023 to 11/21/2024   PA #/Case ID/Reference #: [id number] Key: [id number]

## 2023-11-22 NOTE — Discharge Summary (Signed)
Physician Discharge Summary   Patient: Donna [name] MRN: [id number] DOB: 19-Feb-1971  Admit date:     11/17/2023  Discharge date: 11/22/23  Discharge Physician: Jacquelin Hawking, MD   PCP: Fleet Contras, MD   Recommendations at discharge:  PCP visit for follow-up Outpatient GI  Discharge Diagnoses: Principal Problem:   Right sided weakness Active Problems:   Epigastric pain   Duodenal diverticulum   Occipital neuralgia of right side   Nausea & vomiting  Resolved Problems:   * No resolved hospital problems. *  Hospital Course: Donna [name] is a 52 y.o. female with a history of diabetes mellitus type 2, seizures, SLE, migraine.  Patient presented secondary to persistent right-sided headache with right-sided upper and lower extremity weakness.  There was initial concern for possible stroke versus complicated migraine.  CT and MRI imaging were negative for acute stroke.  Neurology was consulted with concern for possible functional weakness of the upper and lower extremities in addition to possible occipital neuralgia/medication overuse headache treated with a right occipital nerve block.  During hospitalization, patient was also noted to have epigastric pain with CT evidence of mild duodenitis.  Patient unable to tolerate oral diet so GI was consulted for evaluation.  Patient underwent an upper endoscopy which was significant for a duodenal diverticulum which identifies the abnormality seen on CT imaging.  Recommendation for Protonix on discharge.  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 Neurology performed a right occipital nerve block on 12/6 with improvement in pain.   Right-sided limb weakness Unclear etiology, but appears to be functional and triggered by headache. Stroke workup was negative for etiology. Psychiatry consulted and declined inpatient  evaluation; recommending outpatient follow-up. PT and OT recommending outpatient PT/OT. Weakness has improved, per patient, after nerve block. Recommendation for neurology follow-up and psychiatry follow-up on discharge.   Epigastric pain Nausea/vomiting Lipase, LFTs normal. CT abdomen/pelvis significant for mildly thickened proximal duodenum consistent with duodenitis.  CT without evidence of acute pancreatitis, cholecystitis, biliary tree dilation.  Patient unable to tolerate oral intake, however. Continued abdominal pain. GI Consulted and performed an upper GI endoscopy on 12/8 revealing a duodenal diverticulum. Recommendation for Protonix 40 mg daily for 30 days and GI follow-up as needed. Prior to discharge, patient was unable to tolerate diet and had recurrent emesis. Patient is on Trulicity, but has apparently not been on it recently secondary to lack of insurance coverage. Patient decided to leave and will manage symptoms on her own. Zofran provided on discharge.   Diabetes mellitus, type 2 Uncontrolled with hemoglobin A1C of 9.6%. Patient is on Trulicity as an outpatient but has ran out recently. Patient started on Semglee and SSI on admission. Patient declines change in therapy as an outpatient. Trulicity approved and should be available to patient upon discharge. Resume Trulicity.   SLE Patient is on prednisone as needed for flare ups.   History of seizures Noted. Not on AEDs.   Consultants:  Neurology Gastroenterology   Procedures:  Right occipital nerve block Upper GI endoscopy  Disposition: Home Diet recommendation: Carb modified diet  DISCHARGE MEDICATION: Allergies as of 11/22/2023       Reactions   Bee Venom Anaphylaxis   Diamox [acetazolamide] Anaphylaxis   Nsaids Anaphylaxis   Omnipaque [iohexol] Anaphylaxis   Tolmetin Anaphylaxis   Compazine [prochlorperazine] Hives   Iopamidol Hives   Lactose Intolerance (gi) Nausea And Vomiting   Reglan [metoclopramide]  Hives        Medication List     STOP taking these medications    CORICIDIN D PO       TAKE these medications    acetaminophen 500 MG tablet Commonly known as: TYLENOL Take 500 mg by mouth every 6 (six) hours as needed for moderate pain.   ASHWAGANDHA PO Take 1 tablet by mouth daily.   cholecalciferol 1000 units tablet Commonly known as: VITAMIN D Take 1,000 Units by mouth daily.   cyanocobalamin 1000 MCG tablet Commonly known as: VITAMIN B12 Take 1,000 mcg by mouth daily.   EPINEPHrine 0.3 mg/0.3 mL Soaj injection Commonly known as: EpiPen 2-Pak Inject 0.3 mLs (0.3 mg total) into the muscle once as needed (for severe allergic reaction).   ondansetron 4 MG disintegrating tablet Commonly known as: ZOFRAN-ODT Take 1 tablet (4 mg total) by mouth every 8 (eight) hours as needed for nausea or vomiting.   pantoprazole 40 MG tablet Commonly known as: Protonix Take 1 tablet (40 mg total) by mouth daily.   Trulicity 0.75 MG/0.5ML Soaj Generic drug: Dulaglutide Inject 0.75 mg into the skin once a week.               Durable Medical Equipment  (From admission, onward)           Start     Ordered   11/19/23 1154  For home use only DME Walker rolling  Once       Question Answer Comment  Walker: With 5 Inch Wheels   Patient needs a walker to treat with the following condition Weakness      11/19/23 1153            Follow-up Information     [facility]. Schedule an appointment as soon as possible for a visit.   Specialty: Rehabilitation Contact information: [address] [place] [address] [phone]        [facility]. Call.   Why: Psychiatry and therapy services  Please have the patient or guardian call us at [phone] between 8am - 3pm Monday - Friday to complete initial registration and assessment virtuall through our open access process. The patient can also walk-in to our nearest  behavioral health outpatient office if preferred. If walking in, note that our offices are closed for lunch from 12-1pm. Please share the following with the patient when instructing them to call/walk-in to [facility]  Have ID and insurance card available Must be physically located in [place] when receiving virtual services If the patient has a legal guardian (parent or court-appointed), the guardian must be present to sign consents to treat Contact information: [address]  [address] [place] [address] [phone]         Fleet Contras, MD Follow up in 1 week(s).   Specialty: Internal Medicine Why: For hospital follow-up Contact information: [address] [place] [address] [phone]         [facility]. Schedule an appointment as soon as possible for a visit.   Contact information: [address] [place], [address] [place] [address] [phone]        Beverley Fiedler, MD. Schedule an appointment as soon as possible for a visit.   Specialty: Gastroenterology Why: As needed Contact information: [address] [place] [address] [phone]                Discharge Exam: BP 123/60 (BP Location: Right Arm)  Pulse 70   Temp 98.2 F (36.8 C) (Oral)   Resp 18   Ht 5' (1.524 m)   Wt 72.6 kg   LMP 06/14/2003   SpO2 98%   BMI 31.25 kg/m   General exam: Appears calm and comfortable Respiratory system: Clear to auscultation. Respiratory effort normal. Cardiovascular system: S1 & S2 heard, RRR. Gastrointestinal system: Abdomen is nondistended, soft and mildly tender. No organomegaly or masses felt. Normal bowel sounds heard. Central nervous system: Alert and oriented. Psychiatry: Judgement and insight appear normal. Mood & affect appropriate  Condition at discharge: stable  The results of significant diagnostics from this hospitalization (including imaging, microbiology, ancillary and laboratory) are listed  below for reference.   Imaging Studies: DG Abd 1 View  Result Date: 11/21/2023 CLINICAL DATA:  Abdominal pain EXAM: ABDOMEN - 1 VIEW COMPARISON:  11/20/2023 FINDINGS: Nonobstructive bowel gas pattern. No organomegaly or free air. No suspicious calcification. IMPRESSION: No acute findings. Electronically Signed   By: Charlett Nose M.D.   On: 11/21/2023 02:14   DG Abd Portable 1V  Result Date: 11/20/2023 CLINICAL DATA:  Abdominal pain with nausea and guarding. EXAM: PORTABLE ABDOMEN - 1 VIEW COMPARISON:  Abdomen and pelvis CT dated 11/18/2023. FINDINGS: Normal bowel-gas pattern with a paucity of intestinal gas. Unremarkable bones. Interval elevation of the right hemidiaphragm. IMPRESSION: 1. Normal bowel-gas pattern with a paucity of intestinal gas. 2. Interval elevation of the right hemidiaphragm. Electronically Signed   By: Beckie Salts M.D.   On: 11/20/2023 11:43   MR BRAIN WO CONTRAST  Result Date: 11/19/2023 CLINICAL DATA:  Acute neurologic deficit. Headache and right-sided weakness. EXAM: MRI HEAD WITHOUT CONTRAST TECHNIQUE: Multiplanar, multiecho pulse sequences of the brain and surrounding structures were obtained without intravenous contrast. COMPARISON:  None Available. FINDINGS: Brain: No acute infarct, mass effect or extra-axial collection. No chronic microhemorrhage or siderosis. Normal white matter signal, parenchymal volume and CSF spaces. The midline structures are normal. Vascular: Normal flow voids. Skull and upper cervical spine: Normal marrow signal. Sinuses/Orbits: Negative. Other: None. IMPRESSION: Normal brain MRI. Electronically Signed   By: Deatra Robinson M.D.   On: 11/19/2023 02:43   US Abdomen Limited RUQ (LIVER/GB)  Result Date: 11/18/2023 CLINICAL DATA:  Right upper quadrant abdominal tenderness. EXAM: ULTRASOUND ABDOMEN LIMITED RIGHT UPPER QUADRANT COMPARISON:  None Available. FINDINGS: Gallbladder: No gallstones or wall thickening visualized. No sonographic Murphy sign  noted by sonographer. Common bile duct: Diameter: 2.5 mm Liver: No focal lesion identified. Within normal limits in parenchymal echogenicity. Portal vein is patent on color Doppler imaging with normal direction of blood flow towards the liver. Other: None. IMPRESSION: Normal right upper quadrant ultrasound. Electronically Signed   By: Signa Kell M.D.   On: 11/18/2023 20:26   CT ABDOMEN PELVIS WO CONTRAST  Result Date: 11/18/2023 CLINICAL DATA:  Right upper quadrant pain. EXAM: CT ABDOMEN AND PELVIS WITHOUT CONTRAST TECHNIQUE: Multidetector CT imaging of the abdomen and pelvis was performed following the standard protocol without IV contrast. RADIATION DOSE REDUCTION: This exam was performed according to the departmental dose-optimization program which includes automated exposure control, adjustment of the mA and/or kV according to patient size and/or use of iterative reconstruction technique. COMPARISON:  June 09, 2023 FINDINGS: Lower chest: No acute abnormality. Hepatobiliary: No focal liver abnormality is seen. No gallstones, gallbladder wall thickening, or biliary dilatation. Pancreas: Unremarkable. No pancreatic ductal dilatation or surrounding inflammatory changes. Spleen: Normal in size without focal abnormality. Adrenals/Urinary Tract: Adrenal glands are unremarkable. Kidneys are normal in  size, without renal calculi or hydronephrosis. A stable 5.7 cm diameter cyst is seen within the mid left kidney. Bladder is unremarkable. Stomach/Bowel: Stomach is within normal limits. Appendix appears normal. No evidence of bowel dilatation. A short segment of very mildly thickened proximal duodenum is noted (axial CT images 30 through 37, CT series 2). Vascular/Lymphatic: No significant vascular findings are present. No enlarged abdominal or pelvic lymph nodes. Reproductive: Status post hysterectomy. No adnexal masses. Other: No abdominal wall hernia or abnormality. No abdominopelvic ascites. Musculoskeletal: No  acute or significant osseous findings. IMPRESSION: 1. Short segment of very mildly thickened proximal duodenum which may represent mild duodenitis. 2. Stable left renal cyst. No follow-up imaging is recommended. This recommendation follows ACR consensus guidelines: Management of the Incidental Renal Mass on CT: A White Paper of the ACR Incidental Findings Committee. J Am Coll Radiol [phone]. Electronically Signed   By: Aram Candela M.D.   On: 11/18/2023 01:58   CT Head Wo Contrast  Result Date: 11/17/2023 CLINICAL DATA:  Headache, neuro deficit EXAM: CT HEAD WITHOUT CONTRAST TECHNIQUE: Contiguous axial images were obtained from the base of the skull through the vertex without intravenous contrast. RADIATION DOSE REDUCTION: This exam was performed according to the departmental dose-optimization program which includes automated exposure control, adjustment of the mA and/or kV according to patient size and/or use of iterative reconstruction technique. COMPARISON:  CT head 08/21/2020 FINDINGS: Brain: No evidence of large-territorial acute infarction. No parenchymal hemorrhage. No mass lesion. No extra-axial collection. No mass effect or midline shift. No hydrocephalus. Basilar cisterns are patent. Vascular: No hyperdense vessel. Skull: No acute fracture or focal lesion. Sinuses/Orbits: Paranasal sinuses and mastoid air cells are clear. The orbits are unremarkable. Other: None. IMPRESSION: No acute intracranial abnormality. Electronically Signed   By: Tish Frederickson M.D.   On: 11/17/2023 23:48    Microbiology: Results for orders placed or performed during the hospital encounter of 05/03/22  Resp Panel by RT-PCR (Flu A&B, Covid) Nasopharyngeal Swab     Status: None   Collection Time: 05/03/22  1:01 PM   Specimen: Nasopharyngeal Swab; Nasopharyngeal(NP) swabs in vial transport medium  Result Value Ref Range Status   SARS Coronavirus 2 by RT PCR NEGATIVE NEGATIVE Final    Comment:  (NOTE) SARS-CoV-2 target nucleic acids are NOT DETECTED.  The SARS-CoV-2 RNA is generally detectable in upper respiratory specimens during the acute phase of infection. The lowest concentration of SARS-CoV-2 viral copies this assay can detect is 138 copies/mL. A negative result does not preclude SARS-Cov-2 infection and should not be used as the sole basis for treatment or other patient management decisions. A negative result may occur with  improper specimen collection/handling, submission of specimen other than nasopharyngeal swab, presence of viral mutation(s) within the areas targeted by this assay, and inadequate number of viral copies(<138 copies/mL). A negative result must be combined with clinical observations, patient history, and epidemiological information. The expected result is Negative.  Fact Sheet for Patients:  BloggerCourse.com  Fact Sheet for Healthcare Providers:  SeriousBroker.it  This test is no t yet approved or cleared by the [place] FDA and  has been authorized for detection and/or diagnosis of SARS-CoV-2 by FDA under an Emergency Use Authorization (EUA). This EUA will remain  in effect (meaning this test can be used) for the duration of the COVID-19 declaration under Section 564(b)(1) of the Act, 21 U.S.C.section 360bbb-3(b)(1), unless the authorization is terminated  or revoked sooner.       Influenza A by  PCR NEGATIVE NEGATIVE Final   Influenza B by PCR NEGATIVE NEGATIVE Final    Comment: (NOTE) The Xpert Xpress SARS-CoV-2/FLU/RSV plus assay is intended as an aid in the diagnosis of influenza from Nasopharyngeal swab specimens and should not be used as a sole basis for treatment. Nasal washings and aspirates are unacceptable for Xpert Xpress SARS-CoV-2/FLU/RSV testing.  Fact Sheet for Patients: BloggerCourse.com  Fact Sheet for Healthcare  Providers: SeriousBroker.it  This test is not yet approved or cleared by the [place] FDA and has been authorized for detection and/or diagnosis of SARS-CoV-2 by FDA under an Emergency Use Authorization (EUA). This EUA will remain in effect (meaning this test can be used) for the duration of the COVID-19 declaration under Section 564(b)(1) of the Act, 21 U.S.C. section 360bbb-3(b)(1), unless the authorization is terminated or revoked.  Performed at [facility], [address] [place], [place] [address]   Urine Culture     Status: Abnormal   Collection Time: 05/03/22  6:54 PM   Specimen: Urine, Clean Catch  Result Value Ref Range Status   Specimen Description   Final    URINE, CLEAN CATCH Performed at [facility], [address] [place], [place] [address]    Special Requests   Final    NONE Performed at [facility], [address] [place], [place] [address]    Culture (A)  Final    >=100,000 COLONIES/mL LACTOBACILLUS SPECIES Standardized susceptibility testing for this organism is not available. Performed at [facility], [address], [place], [place] [address]    Report Status 05/05/2022 FINAL  Final    Labs: CBC: Recent Labs  Lab 11/17/23 2255 11/19/23 0449 11/21/23 0639  WBC 7.3 13.1* 9.2  HGB 12.9 12.0 13.7  HCT 40.5 37.6 43.4  MCV 83.9 82.6 84.4  PLT 317 349 317   Basic Metabolic Panel: Recent Labs  Lab 11/18/23 0023 11/19/23 0449 11/21/23 0639  NA 138 135 137  K 3.8 3.5 4.0  CL 101 101 104  CO2 28 24 23   GLUCOSE 180* 252* 144*  BUN 18 19 13   CREATININE 0.86 0.91 0.92  CALCIUM 10.4* 9.6 9.7   Liver Function Tests: Recent Labs  Lab 11/18/23 0023  AST 19  ALT 18  ALKPHOS 96  BILITOT 0.4  PROT 8.2*  ALBUMIN 4.7   CBG: Recent Labs  Lab 11/21/23 1625 11/21/23 2114 11/22/23 0624 11/22/23 1203 11/22/23 1623  GLUCAP 203* 114* 120* 122*  163*    Discharge time spent: 35 minutes.  Signed: Jacquelin Hawking, MD [facility] 11/22/2023

## 2023-11-22 NOTE — Progress Notes (Signed)
Occupational Therapy Treatment Patient Details Name: Donna [name] MRN: [id number] DOB: [birth year] Today's Date: 11/22/2023   History of present illness 52 y.o. female presented to [facility] ED with c/o persistent headache and right-sided weakness MRI at [facility] with no acute findings. PMH significant for DM2 not on meds, seizures not on meds, SLE, remote history of migraine.   OT comments  Pt much improved since last session. RUE appears to be [facility]. Educated on HEP as noted below. ADL most likely close to baseline. Pt currently complaining about not being able to eat. No further OT needed. OT signing off.       If plan is discharge home, recommend the following:  Assistance with cooking/housework;Assist for transportation   Equipment Recommendations  None recommended by OT    Recommendations for Other Services      Precautions / Restrictions Precautions Precautions: None       Mobility Bed Mobility Overal bed mobility: Independent                  Transfers Overall transfer level: Modified independent                       Balance                                           ADL either performed or assessed with clinical judgement   ADL Overall ADL's : At baseline                                            Extremity/Trunk Assessment Upper Extremity Assessment Upper Extremity Assessment: Overall WFL for tasks assessed            Vision       Perception     Praxis      Cognition Arousal: Alert Behavior During Therapy: WFL for tasks assessed/performed Overall Cognitive Status: Within Functional Limits for tasks assessed                                          Exercises Other Exercises Other Exercises: educated on putty (yelow) HEP for grip/pinch strengthening and fine motor/coordination    Shoulder Instructions       General Comments      Pertinent Vitals/ Pain       Pain  Assessment Pain Assessment: Faces Faces Pain Scale: Hurts a little bit Pain Location: abdominal discomfort Pain Descriptors / Indicators: Discomfort, Grimacing, Burning Pain Intervention(s): Limited activity within patient's tolerance  Home Living                                          Prior Functioning/Environment              Frequency  Min 1X/week        Progress Toward Goals  OT Goals(current goals can now be found in the care plan section)  Progress towards OT goals: Goals met/education completed, patient discharged from OT  Acute Rehab OT Goals Patient Stated Goal: to be able to eat OT Goal Formulation: With  patient Time For Goal Achievement: 12/03/23 Potential to Achieve Goals: Good ADL Goals Pt Will Perform Grooming: with modified independence;standing Pt Will Perform Lower Body Dressing: with modified independence;sit to/from stand Pt Will Transfer to Toilet: with modified independence;ambulating Pt/caregiver will Perform Home Exercise Program: Increased strength;Right Upper extremity;Increased ROM;With written HEP provided;Independently  Plan      Co-evaluation                 AM-PAC OT "6 Clicks" Daily Activity     Outcome Measure   Help from another person eating meals?: None Help from another person taking care of personal grooming?: None Help from another person toileting, which includes using toliet, bedpan, or urinal?: None Help from another person bathing (including washing, rinsing, drying)?: None Help from another person to put on and taking off regular upper body clothing?: None Help from another person to put on and taking off regular lower body clothing?: None 6 Click Score: 24    End of Session    OT Visit Diagnosis: Unsteadiness on feet (R26.81);Muscle weakness (generalized) (M62.81)   Activity Tolerance Patient tolerated treatment well   Patient Left in bed;with call bell/phone within reach;with  family/visitor present   Nurse Communication  (DC needs)        Time: 9563-8756 OT Time Calculation (min): 14 min  Charges: OT General Charges $OT Visit: 1 Visit OT Treatments $Self Care/Home Management : 8-22 mins  Luisa Dago, OT/L   Acute OT Clinical Specialist Acute Rehabilitation Services Pager [phone] Office [phone]   [facility] 11/22/2023, 10:40 AM

## 2023-11-22 NOTE — Progress Notes (Signed)
PROGRESS NOTE    Donna [name]  MVH:[id number] DOB: Jul 22, 1971 DOA: 11/17/2023 PCP: Fleet Contras, MD   Brief Narrative: Donna [name] is a 52 y.o. female with a history of diabetes mellitus type 2, seizures, SLE, migraine.  Patient presented secondary to persistent right-sided headache with right-sided upper and lower extremity weakness.  There was initial concern for possible stroke versus complicated migraine.  CT and MRI imaging were negative for acute stroke.  Neurology was consulted with concern for possible functional weakness of the upper and lower extremities in addition to possible occipital neuralgia/medication overuse headache treated with a right occipital nerve block.  During hospitalization, patient was also noted to have epigastric pain with CT evidence of mild duodenitis.  Patient unable to tolerate oral diet so GI was consulted for evaluation.  Patient underwent an upper endoscopy which was significant for a duodenal diverticulum which identifies the abnormality seen on CT imaging.  Recommendation for Protonix on discharge.   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 Neurology performed a right occipital nerve block on 12/6 with improvement in pain.  Right-sided limb weakness Unclear etiology, but appears to be functional and triggered by headache. Stroke workup was negative for etiology. Psychiatry consulted and declined inpatient evaluation; recommending outpatient follow-up. PT and OT recommending outpatient PT/OT. Weakness has improved, per patient, after nerve block. Recommendation for neurology follow-up and psychiatry follow-up on discharge.  Epigastric pain Nausea/vomiting Lipase, LFTs normal. CT abdomen/pelvis significant for mildly thickened proximal duodenum consistent with duodenitis.  CT without evidence of acute pancreatitis,  cholecystitis, biliary tree dilation.  Patient unable to tolerate oral intake, however. Continued abdominal pain. GI Consulted and performed an upper GI endoscopy on 12/8 revealing a duodenal diverticulum. Recommendation for Protonix 40 mg daily for 30 days and GI follow-up as needed. Prior to discharge, patient was unable to tolerate diet and had recurrent emesis. Patient is on Trulicity, but has apparently not been on it recently secondary to lack of insurance coverage. -Protonix -Zofran PRN -Will consider gastric emptying study; avoid narcotics (discontinued) -Heating pad as needed -Continue Robaxin PRN  Diabetes mellitus, type 2 Uncontrolled with hemoglobin A1C of 9.6%. Patient is on Trulicity as an outpatient but has ran out recently. Patient started on Semglee and SSI on admission. Patient declines change in therapy as an outpatient. Trulicity approved and should be available to patient upon discharge. -Continue Semglee and SSI while inpatient.  SLE Patient is on prednisone as needed for flare ups.  History of seizures Noted. Not on AEDs.   DVT prophylaxis: Lovenox Code Status:   Code Status: Full Code Family Communication: Daughter at bedside Disposition Plan: Discharge pending ability to tolerate oral intake   Consultants:  Neurology Gastroenterology  Procedures:  Right occipital nerve block Upper GI endoscopy  Antimicrobials: None    Subjective: Ongoing nausea with vomiting when attempting to take anything by mouth.  Objective: BP 131/74 (BP Location: Left Arm)   Pulse 70   Temp 98.1 F (36.7 C) (Oral)   Resp 18   Ht 5' (1.524 m)   Wt 72.6 kg   LMP 06/14/2003   SpO2 100%   BMI 31.25 kg/m   Examination:  General exam: Appears calm and comfortable Respiratory system: Clear to auscultation. Respiratory effort normal. Cardiovascular system: S1 & S2 heard, RRR. Gastrointestinal system: Abdomen is nondistended, soft and mildly tender. No organomegaly or  masses felt. Normal bowel sounds  heard. Central nervous system: Alert and oriented. Psychiatry: Judgement and insight appear normal. Mood & affect appropriate.    Data Reviewed: I have personally reviewed following labs and imaging studies  CBC Lab Results  Component Value Date   WBC 9.2 11/21/2023   RBC 5.14 (H) 11/21/2023   HGB 13.7 11/21/2023   HCT 43.4 11/21/2023   MCV 84.4 11/21/2023   MCH 26.7 11/21/2023   PLT 317 11/21/2023   MCHC 31.6 11/21/2023   RDW 13.1 11/21/2023   LYMPHSABS 2.3 06/09/2023   MONOABS 0.5 06/09/2023   EOSABS 0.1 06/09/2023   BASOSABS 0.1 06/09/2023     Last metabolic panel Lab Results  Component Value Date   NA 137 11/21/2023   K 4.0 11/21/2023   CL 104 11/21/2023   CO2 23 11/21/2023   BUN 13 11/21/2023   CREATININE 0.92 11/21/2023   GLUCOSE 144 (H) 11/21/2023   GFRNONAA >60 11/21/2023   GFRAA >60 08/21/2020   CALCIUM 9.7 11/21/2023   PHOS 4.1 03/11/2018   PROT 8.2 (H) 11/18/2023   ALBUMIN 4.7 11/18/2023   LABGLOB 2.7 07/08/2022   AGRATIO 1.7 07/08/2022   BILITOT 0.4 11/18/2023   ALKPHOS 96 11/18/2023   AST 19 11/18/2023   ALT 18 11/18/2023   ANIONGAP 10 11/21/2023    GFR: Estimated Creatinine Clearance: 62.1 mL/min (by C-G formula based on SCr of 0.92 mg/dL).  No results found for this or any previous visit (from the past 240 hour(s)).    Radiology Studies: DG Abd 1 View  Result Date: 11/21/2023 CLINICAL DATA:  Abdominal pain EXAM: ABDOMEN - 1 VIEW COMPARISON:  11/20/2023 FINDINGS: Nonobstructive bowel gas pattern. No organomegaly or free air. No suspicious calcification. IMPRESSION: No acute findings. Electronically Signed   By: Charlett Nose M.D.   On: 11/21/2023 02:14      LOS: 2 days    Jacquelin Hawking, MD [facility] 11/22/2023, 1:37 PM   If 7PM-7AM, please contact night-coverage [email or web address]

## 2023-11-22 NOTE — TOC Progression Note (Signed)
Transition of Care [facility]) - Progression Note    Patient Details  Name: Donna [name] MRN: [id number] Date of Birth: 1971-04-02  [facility]) CM/SW Contact  Kermit Balo, RN Phone Number: 11/22/2023, 3:35 PM  Clinical Narrative:     Discharge held due to nausea and vomiting.  TOC following.  Expected Discharge Plan: OP Rehab Barriers to Discharge: No Barriers Identified  Expected Discharge Plan and Services         Expected Discharge Date: 11/21/23               DME Arranged: Dan Humphreys rolling DME Agency: [facility] Date DME Agency Contacted: 11/19/23   Representative spoke with at DME Agency: Lorelle Gibbs             Social Determinants of Health (SDOH) Interventions SDOH Screenings   Food Insecurity: No Food Insecurity (11/18/2023)  Housing: Low Risk  (11/18/2023)  Transportation Needs: No Transportation Needs (11/18/2023)  Utilities: Not At Risk (11/18/2023)  Depression (PHQ2-9): Low Risk  (07/08/2022)  Social Connections: Unknown (03/16/2023)   Received from [facility], [facility]  Tobacco Use: Low Risk  (11/21/2023)    Readmission Risk Interventions     No data to display

## 2023-11-23 ENCOUNTER — Encounter (HOSPITAL_COMMUNITY): Payer: Self-pay | Admitting: Internal Medicine

## 2023-11-26 ENCOUNTER — Encounter (HOSPITAL_COMMUNITY): Payer: Self-pay | Admitting: Emergency Medicine

## 2023-11-26 ENCOUNTER — Emergency Department (HOSPITAL_COMMUNITY)
Admission: EM | Admit: 2023-11-26 | Discharge: 2023-11-27 | Disposition: A | Discharge status: Home / Self Care | Payer: 59 | Attending: Emergency Medicine | Admitting: Emergency Medicine

## 2023-11-26 ENCOUNTER — Other Ambulatory Visit: Payer: Self-pay

## 2023-11-26 DIAGNOSIS — R1011 Right upper quadrant pain: Secondary | ICD-10-CM | POA: Diagnosis present

## 2023-11-26 DIAGNOSIS — R1084 Generalized abdominal pain: Secondary | ICD-10-CM | POA: Insufficient documentation

## 2023-11-26 DIAGNOSIS — Z794 Long term (current) use of insulin: Secondary | ICD-10-CM | POA: Diagnosis not present

## 2023-11-26 LAB — COMPREHENSIVE METABOLIC PANEL
ALT: 26 U/L (ref 0–44)
AST: 30 U/L (ref 15–41)
Albumin: 4.6 g/dL (ref 3.5–5.0)
Alkaline Phosphatase: 118 U/L (ref 38–126)
Anion gap: 14 (ref 5–15)
BUN: 11 mg/dL (ref 6–20)
CO2: 23 mmol/L (ref 22–32)
Calcium: 9.9 mg/dL (ref 8.9–10.3)
Chloride: 101 mmol/L (ref 98–111)
Creatinine, Ser: 0.78 mg/dL (ref 0.44–1.00)
GFR, Estimated: >60 mL/min (ref >60)
Glucose, Bld: 141 mg/dL — ABNORMAL HIGH (ref 70–99)
Potassium: 4.3 mmol/L (ref 3.5–5.1)
Sodium: 138 mmol/L (ref 135–145)
Total Bilirubin: 0.6 mg/dL (ref <1.2)
Total Protein: 8.9 g/dL — ABNORMAL HIGH (ref 6.5–8.1)

## 2023-11-26 LAB — URINALYSIS, ROUTINE W REFLEX MICROSCOPIC
Bilirubin Urine: NEGATIVE
Glucose, UA: >=500 mg/dL — AB
Hgb urine dipstick: NEGATIVE
Ketones, ur: NEGATIVE mg/dL
Nitrite: NEGATIVE
Protein, ur: NEGATIVE mg/dL
Specific Gravity, Urine: 1.016 (ref 1.005–1.030)
pH: 5 (ref 5.0–8.0)

## 2023-11-26 LAB — CBC
HCT: 45.8 % (ref 36.0–46.0)
Hemoglobin: 14.4 g/dL (ref 12.0–15.0)
MCH: 26.6 pg (ref 26.0–34.0)
MCHC: 31.4 g/dL (ref 30.0–36.0)
MCV: 84.7 fL (ref 80.0–100.0)
Platelets: 367 10*3/uL (ref 150–400)
RBC: 5.41 MIL/uL — ABNORMAL HIGH (ref 3.87–5.11)
RDW: 13.2 % (ref 11.5–15.5)
WBC: 7.6 10*3/uL (ref 4.0–10.5)
nRBC: 0 % (ref 0.0–0.2)

## 2023-11-26 LAB — LIPASE, BLOOD: Lipase: 31 U/L (ref 11–51)

## 2023-11-26 NOTE — ED Triage Notes (Signed)
Pt c/o right upper abdominal pain that has been ongoing since hospital admission on 12/4. 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 Pain worsening with eating and drinking. Associated with NV. Last BM yesterday. Has not taken any medication since discharge on 12/9 (zofran/protonix prescription) d/t discharge instructions sts "nothing in her system" for upcomming procedure.

## 2023-11-27 ENCOUNTER — Emergency Department (HOSPITAL_COMMUNITY): Payer: 59

## 2023-11-27 DIAGNOSIS — R1084 Generalized abdominal pain: Secondary | ICD-10-CM | POA: Diagnosis not present

## 2023-11-27 MED ORDER — PANTOPRAZOLE SODIUM 40 MG IV SOLR
40.0000 mg | Freq: Once | INTRAVENOUS | Status: AC
  Administered 2023-11-27: 40 mg via INTRAVENOUS
  Filled 2023-11-27: qty 10

## 2023-11-27 MED ORDER — DICYCLOMINE HCL 10 MG PO CAPS
10.0000 mg | ORAL_CAPSULE | Freq: Once | ORAL | Status: AC
  Administered 2023-11-27: 10 mg via ORAL
  Filled 2023-11-27: qty 1

## 2023-11-27 MED ORDER — ALUM & MAG HYDROXIDE-SIMETH 200-200-20 MG/5ML PO SUSP
30.0000 mL | Freq: Once | ORAL | Status: AC
  Administered 2023-11-27: 30 mL via ORAL
  Filled 2023-11-27: qty 30

## 2023-11-27 MED ORDER — ONDANSETRON HCL 4 MG/2ML IJ SOLN
4.0000 mg | Freq: Once | INTRAMUSCULAR | Status: AC
  Administered 2023-11-27: 4 mg via INTRAVENOUS
  Filled 2023-11-27: qty 2

## 2023-11-27 MED ORDER — FENTANYL CITRATE PF 50 MCG/ML IJ SOSY
50.0000 ug | PREFILLED_SYRINGE | Freq: Once | INTRAMUSCULAR | Status: AC
  Administered 2023-11-27: 50 ug via INTRAVENOUS
  Filled 2023-11-27: qty 1

## 2023-11-27 MED ORDER — OXYCODONE-ACETAMINOPHEN 5-325 MG PO TABS
1.0000 | ORAL_TABLET | Freq: Once | ORAL | Status: AC
Start: 2023-11-27 — End: 2023-11-27
  Administered 2023-11-27: 1 via ORAL
  Filled 2023-11-27: qty 1

## 2023-11-27 NOTE — ED Provider Notes (Signed)
[place] [facility] [place] EMERGENCY DEPARTMENT AT [facility] Provider Note   CSN: [id number] Arrival date & time: 11/26/23  2218     History  Chief Complaint  Patient presents with   Abdominal Pain    Donna [name] is a 52 y.o. female with chronic right upper quadrant pain, recent diagnosis of duodenal diverticulum.  Poorly tolerating p.o.  Was recently admitted for same and other concerns, however patient chose to be discharged prior to tolerating p.o.  Has not been taking Zofran and pantoprazole at home.  Postprandial pain is severe.  Patient tearful and anxious appearing. Extensive history of abdominal pain and abdominal imaging the past.  HPI     Home Medications Prior to Admission medications   Medication Sig Start Date End Date Taking? Authorizing Provider  acetaminophen (TYLENOL) 500 MG tablet Take 500 mg by mouth every 6 (six) hours as needed for moderate pain.    [provider]  ASHWAGANDHA PO Take 1 tablet by mouth daily.    [provider]  cholecalciferol (VITAMIN D) 1000 units tablet Take 1,000 Units by mouth daily.    [provider]  Dulaglutide (TRULICITY) 0.75 MG/0.5ML SOPN Inject 0.75 mg into the skin once a week. Patient not taking: Reported on 11/18/2023 01/18/23   Hoy Register, MD  EPINEPHrine (EPIPEN 2-PAK) 0.3 mg/0.3 mL IJ SOAJ injection Inject 0.3 mLs (0.3 mg total) into the muscle once as needed (for severe allergic reaction). 06/28/20   Molpus, John, MD  ondansetron (ZOFRAN-ODT) 4 MG disintegrating tablet Take 1 tablet (4 mg total) by mouth every 8 (eight) hours as needed for nausea or vomiting. 11/22/23   Narda Bonds, MD  pantoprazole (PROTONIX) 40 MG tablet Take 1 tablet (40 mg total) by mouth daily. 11/21/23 12/21/23  Narda Bonds, MD  vitamin B-12 (CYANOCOBALAMIN) 1000 MCG tablet Take 1,000 mcg by mouth daily.    [provider]  dicyclomine (BENTYL) 20 MG tablet Take 1 tablet (20 mg total) by mouth 2 (two)  times daily as needed for spasms. 02/20/19 06/28/20  Fawze, Mina A, PA-C  ipratropium-albuterol (DUONEB) 0.5-2.5 (3) MG/3ML SOLN Take 3 mLs by nebulization every 4 (four) hours as needed. Patient not taking: Reported on 09/22/2018 03/11/18 06/07/19  Marguerita Merles Latif, DO  omeprazole (PRILOSEC) 20 MG capsule Take 1 capsule (20 mg total) by mouth 2 (two) times daily before a meal. 06/07/19 06/28/20  Pollina, Canary Brim, MD      Allergies    Bee venom, Diamox [acetazolamide], Nsaids, Omnipaque [iohexol], Tolmetin, Compazine [prochlorperazine], Iopamidol, Lactose intolerance (gi), and Reglan [metoclopramide]    Review of Systems   Review of Systems  Gastrointestinal:  Positive for abdominal pain.    Physical Exam Updated Vital Signs BP 124/87   Pulse 92   Temp 97.9 F (36.6 C) (Oral)   Resp 17   Ht 5' (1.524 m)   Wt 72.6 kg   LMP 06/14/2003   SpO2 100%   BMI 31.25 kg/m  Physical Exam Vitals and nursing note reviewed.  Constitutional:      Appearance: She is not ill-appearing or toxic-appearing.  HENT:     Head: Normocephalic and atraumatic.     Mouth/Throat:     Mouth: Mucous membranes are moist.     Pharynx: No oropharyngeal exudate or posterior oropharyngeal erythema.  Eyes:     General:        Right eye: No discharge.        Left eye: No discharge.  Conjunctiva/sclera: Conjunctivae normal.  Cardiovascular:     Rate and Rhythm: Normal rate and regular rhythm.     Pulses: Normal pulses.  Pulmonary:     Effort: Pulmonary effort is normal. No respiratory distress.     Breath sounds: Normal breath sounds. No wheezing or rales.  Abdominal:     General: Bowel sounds are normal. There is no distension.     Tenderness: There is abdominal tenderness in the epigastric area and periumbilical area.  Musculoskeletal:        General: No deformity.     Cervical back: Neck supple.  Skin:    General: Skin is warm and dry.  Neurological:     Mental Status: She is alert. Mental  status is at baseline.  Psychiatric:        Mood and Affect: Mood normal.     ED Results / Procedures / Treatments   Labs (all labs ordered are listed, but only abnormal results are displayed) Labs Reviewed  COMPREHENSIVE METABOLIC PANEL - Abnormal; Notable for the following components:      Result Value   Glucose, Bld 141 (*)    Total Protein 8.9 (*)    All other components within normal limits  CBC - Abnormal; Notable for the following components:   RBC 5.41 (*)    All other components within normal limits  URINALYSIS, ROUTINE W REFLEX MICROSCOPIC - Abnormal; Notable for the following components:   Glucose, UA >=500 (*)    Leukocytes,Ua TRACE (*)    Bacteria, UA RARE (*)    All other components within normal limits  LIPASE, BLOOD    EKG None  Radiology CT ABDOMEN PELVIS WO CONTRAST Result Date: 11/27/2023 CLINICAL DATA:  Right lower quadrant pain EXAM: CT ABDOMEN AND PELVIS WITHOUT CONTRAST TECHNIQUE: Multidetector CT imaging of the abdomen and pelvis was performed following the standard protocol without IV contrast. RADIATION DOSE REDUCTION: This exam was performed according to the departmental dose-optimization program which includes automated exposure control, adjustment of the mA and/or kV according to patient size and/or use of iterative reconstruction technique. COMPARISON:  CT abdomen and pelvis 11/18/2023 FINDINGS: Lower chest: No acute abnormality. Hepatobiliary: No focal liver abnormality is seen. No gallstones, gallbladder wall thickening, or biliary dilatation. Pancreas: Unremarkable. No pancreatic ductal dilatation or surrounding inflammatory changes. Spleen: Normal in size without focal abnormality. Adrenals/Urinary Tract: There is a 5.7 cm cyst in the left kidney. Otherwise, the kidneys, adrenal glands and bladder are within normal limits. Stomach/Bowel: Stomach is within normal limits. Appendix appears normal. No evidence of bowel wall thickening, distention, or  inflammatory changes. There is a large amount of stool throughout the colon. Vascular/Lymphatic: No significant vascular findings are present. No enlarged abdominal or pelvic lymph nodes. Reproductive: Status post hysterectomy. No adnexal masses. Other: There subcentimeter focal areas of hyperdensity in the subcutaneous tissues of the lower right anterior abdominal wall which may be related to small hematomas. There is no abdominal wall hernia or ascites. Musculoskeletal: No acute or significant osseous findings. IMPRESSION: 1. No acute localizing process in the abdomen or pelvis. 2. Large amount of stool throughout the colon. 3. Subcentimeter focal areas of hyperdensity in the subcutaneous tissues of the lower right anterior abdominal wall may be related to small hematomas. 4. Left Bosniak I benign renal cyst measuring 5.7 cm. No follow-up imaging is recommended. JACR 2018 Feb; 264-273, Management of the Incidental Renal Mass on CT, [employer] 2021; 814-848, Bosniak Classification of Cystic Renal Masses, Version 2019. Electronically Signed  By: Darliss Cheney M.D.   On: 11/27/2023 03:29    Procedures Procedures    Medications Ordered in ED Medications  fentaNYL (SUBLIMAZE) injection 50 mcg (50 mcg Intravenous Given 11/27/23 0332)  ondansetron (ZOFRAN) injection 4 mg (4 mg Intravenous Given 11/27/23 0332)  pantoprazole (PROTONIX) injection 40 mg (40 mg Intravenous Given 11/27/23 0402)  alum & mag hydroxide-simeth (MAALOX/MYLANTA) 200-200-20 MG/5ML suspension 30 mL (30 mLs Oral Given 11/27/23 0359)  dicyclomine (BENTYL) capsule 10 mg (10 mg Oral Given 11/27/23 0359)  oxyCODONE-acetaminophen (PERCOCET/ROXICET) 5-325 MG per tablet 1 tablet (1 tablet Oral Given 11/27/23 0602)    ED Course/ Medical Decision Making/ A&P Clinical Course as of 11/27/23 9811  Sat Nov 27, 2023  0419 CT ABDOMEN PELVIS WO CONTRAST [RS]    Clinical Course User Index [RS] Santa Lighter Eugene Gavia, PA-C                                  Medical Decision Making 52 y/o female with concern for abdominal pain.   Amount and/or Complexity of Data Reviewed Labs: ordered.    Details: CBC unremarkable, CMP unremarkable, UA with glucosuria, history of diabetes.  Lipase unremarkable.   Radiology: ordered. Decision-making details documented in ED Course.    Details: CT of the abdomen and pelvis obtained, negative for acute intra-abdominal pelvic abnormality.   Risk OTC drugs. Prescription drug management.   atient administered GI cocktail, Zofran and IV pain medication with improvement in her pain.  Additionally administered Bentyl, tolerating p.o. in the ED.  Recommended to discharge with pantoprazole and Zofran, close outpatient gastroenterology follow-up.  Clinical concern for emergent underlying condition that would warrant further ED workup or inpatient management is exceedingly low.  Geanie voiced understanding of her medical evaluation and treatment plan. Each of their questions answered to their expressed satisfaction.  Return precautions were given.  Patient is well-appearing, stable, and was discharged in good condition.  This chart was dictated using voice recognition software, Dragon. Despite the best efforts of this provider to proofread and correct errors, errors may still occur which can change documentation meaning.         Final Clinical Impression(s) / ED Diagnoses Final diagnoses:  Generalized abdominal pain    Rx / DC Orders ED Discharge Orders     None         Paris Lore, PA-C 11/27/23 0719    Melene Plan, DO 11/27/23 2258

## 2023-11-27 NOTE — Discharge Instructions (Signed)
You are seen here for abdominal pain.  Was reassuring consult with gastroenterologist.  Leodis Rains prescribed medication and pantoprazole as discussed.  Return to ER with any severe symptoms

## 2024-08-12 ENCOUNTER — Other Ambulatory Visit: Payer: Self-pay

## 2024-08-12 ENCOUNTER — Emergency Department (HOSPITAL_BASED_OUTPATIENT_CLINIC_OR_DEPARTMENT_OTHER)

## 2024-08-12 ENCOUNTER — Emergency Department (HOSPITAL_BASED_OUTPATIENT_CLINIC_OR_DEPARTMENT_OTHER)
Admission: EM | Admit: 2024-08-12 | Discharge: 2024-08-12 | Disposition: A | Discharge status: Home / Self Care | Attending: Emergency Medicine | Admitting: Emergency Medicine

## 2024-08-12 ENCOUNTER — Encounter (HOSPITAL_BASED_OUTPATIENT_CLINIC_OR_DEPARTMENT_OTHER): Payer: Self-pay | Admitting: Emergency Medicine

## 2024-08-12 DIAGNOSIS — S0990XA Unspecified injury of head, initial encounter: Secondary | ICD-10-CM | POA: Diagnosis present

## 2024-08-12 DIAGNOSIS — M546 Pain in thoracic spine: Secondary | ICD-10-CM | POA: Insufficient documentation

## 2024-08-12 DIAGNOSIS — S060X0A Concussion without loss of consciousness, initial encounter: Secondary | ICD-10-CM | POA: Diagnosis not present

## 2024-08-12 DIAGNOSIS — R519 Headache, unspecified: Secondary | ICD-10-CM

## 2024-08-12 DIAGNOSIS — M549 Dorsalgia, unspecified: Secondary | ICD-10-CM

## 2024-08-12 DIAGNOSIS — W01198A Fall on same level from slipping, tripping and stumbling with subsequent striking against other object, initial encounter: Secondary | ICD-10-CM | POA: Insufficient documentation

## 2024-08-12 DIAGNOSIS — M542 Cervicalgia: Secondary | ICD-10-CM | POA: Diagnosis not present

## 2024-08-12 DIAGNOSIS — M545 Low back pain, unspecified: Secondary | ICD-10-CM | POA: Insufficient documentation

## 2024-08-12 MED ORDER — ONDANSETRON 4 MG PO TBDP
4.0000 mg | ORAL_TABLET | Freq: Once | ORAL | Status: AC
  Administered 2024-08-12: 4 mg via ORAL
  Filled 2024-08-12: qty 1

## 2024-08-12 MED ORDER — OXYCODONE-ACETAMINOPHEN 5-325 MG PO TABS
1.0000 | ORAL_TABLET | Freq: Once | ORAL | Status: AC
  Administered 2024-08-12: 1 via ORAL
  Filled 2024-08-12: qty 1

## 2024-08-12 NOTE — ED Notes (Signed)
 Pt in bed c/o pain unchanged by meds previously given. MD made aware

## 2024-08-12 NOTE — ED Triage Notes (Signed)
  Patient comes in with headache and back pain after a fall at work 1 week ago.  Patient states she was sitting in her chair and it fell backwards, causing her to hit her head and back on metal dolly.  Patient endorses throbbing headache that radiates down spine.  Last took tylenol  yesterday.  Pain 8/10, throbbing.

## 2024-08-12 NOTE — ED Notes (Signed)
 ED Provider at bedside.

## 2024-08-12 NOTE — ED Provider Notes (Signed)
 [place] [facility] AT [facility] Provider Note   CSN: [id number] Arrival date & time: 08/12/24  1924     Patient presents with: Donna [name] is a 53 y.o. female.  Who presents for injuries after fall.  Patient reports falling backwards out of her chair at a desk at work.  She struck her head on a metal dolly behind her.  No LOC.  Landed on her back.  She has had significant headaches and back pain since then.  8 out of 10 in severity.  Patient has been taking Tylenol  at home.  She has not been able to walk although she is feels very sore.  No fecal or urinary incontinence significant sensory deficits or focal weakness    Fall       Prior to Admission medications   Medication Sig Start Date End Date Taking? Authorizing Provider  acetaminophen  (TYLENOL ) 500 MG tablet Take 500 mg by mouth every 6 (six) hours as needed for moderate pain.    [provider]  ASHWAGANDHA PO Take 1 tablet by mouth daily.    [provider]  cholecalciferol  (VITAMIN D ) 1000 units tablet Take 1,000 Units by mouth daily.    [provider]  Dulaglutide  (TRULICITY ) 0.75 MG/0.5ML SOPN Inject 0.75 mg into the skin once a week. Patient not taking: Reported on 11/18/2023 01/18/23   Newlin, Enobong, MD  EPINEPHrine  (EPIPEN  2-PAK) 0.3 mg/0.3 mL IJ SOAJ injection Inject 0.3 mLs (0.3 mg total) into the muscle once as needed (for severe allergic reaction). 06/28/20   Molpus, John, MD  ondansetron  (ZOFRAN -ODT) 4 MG disintegrating tablet Take 1 tablet (4 mg total) by mouth every 8 (eight) hours as needed for nausea or vomiting. 11/22/23   Briana Elgin LABOR, MD  pantoprazole  (PROTONIX ) 40 MG tablet Take 1 tablet (40 mg total) by mouth daily. 11/21/23 12/21/23  Briana Elgin LABOR, MD  vitamin B-12 (CYANOCOBALAMIN ) 1000 MCG tablet Take 1,000 mcg by mouth daily.    [provider]  dicyclomine  (BENTYL ) 20 MG tablet Take 1 tablet (20 mg total) by mouth 2 (two) times  daily as needed for spasms. 02/20/19 06/28/20  Fawze, Mina A, PA-C  ipratropium-albuterol  (DUONEB) 0.5-2.5 (3) MG/3ML SOLN Take 3 mLs by nebulization every 4 (four) hours as needed. Patient not taking: Reported on 09/22/2018 03/11/18 06/07/19  Sherrill Cable Latif, DO  omeprazole  (PRILOSEC) 20 MG capsule Take 1 capsule (20 mg total) by mouth 2 (two) times daily before a meal. 06/07/19 06/28/20  Pollina, Lonni PARAS, MD    Allergies: Bee venom, Diamox [acetazolamide], Nsaids, Omnipaque  [iohexol ], Tolmetin, Compazine [prochlorperazine], Iopamidol , Lactose intolerance (gi), and Reglan [metoclopramide]    Review of Systems  Updated Vital Signs BP (!) 142/75 (BP Location: Right Arm)   Pulse 90   Temp 98.2 F (36.8 C) (Oral)   Resp 15   Ht 5' (1.524 m)   Wt 64.9 kg   LMP 06/14/2003   SpO2 98%   BMI 27.93 kg/m   Physical Exam Vitals and nursing note reviewed.  HENT:     Head: Normocephalic and atraumatic.  Eyes:     Pupils: Pupils are equal, round, and reactive to light.  Neck:     Comments: Midline cervical tenderness Cardiovascular:     Rate and Rhythm: Normal rate and regular rhythm.  Pulmonary:     Effort: Pulmonary effort is normal.     Breath sounds: Normal breath sounds.  Abdominal:     Palpations: Abdomen is  soft.     Tenderness: There is no abdominal tenderness.  Musculoskeletal:     Cervical back: Neck supple. Tenderness present.     Comments: Diffuse midline tenderness over thoracic and lumbar spine without step-off deformity  Skin:    General: Skin is warm and dry.  Neurological:     General: No focal deficit present.     Mental Status: She is alert.     Sensory: No sensory deficit.     Motor: No weakness.     Comments: 5 out of 5 motor strength bilateral upper and lower extremities with sensation intact light touch throughout  Psychiatric:        Mood and Affect: Mood normal.     (all labs ordered are listed, but only abnormal results are displayed) Labs  Reviewed - No data to display  EKG: None  Radiology: CT Cervical Spine Wo Contrast Result Date: 08/12/2024 CLINICAL DATA:  Back trauma, no prior imaging (Age >= 16y); Neck trauma, dangerous injury mechanism (Age 34-64y) EXAM: CT CERVICAL, THORACIC, AND LUMBAR SPINE WITHOUT CONTRAST TECHNIQUE: Multidetector CT imaging of the cervical, thoracic and lumbar spine was performed without intravenous contrast. Multiplanar CT image reconstructions were also generated. RADIATION DOSE REDUCTION: This exam was performed according to the departmental dose-optimization program which includes automated exposure control, adjustment of the mA and/or kV according to patient size and/or use of iterative reconstruction technique. COMPARISON:  None Available. FINDINGS: CT CERVICAL SPINE FINDINGS Alignment: Normal. Skull base and vertebrae: No acute fracture. No aggressive appearing focal osseous lesion or focal pathologic process. Soft tissues and spinal canal: No prevertebral fluid or swelling. No visible canal hematoma. Upper chest: Unremarkable. Other: None. CT THORACIC SPINE FINDINGS Alignment: Normal. Vertebrae: No acute fracture or focal pathologic process. Paraspinal and other soft tissues: Negative. Disc levels: Maintained. CT LUMBAR SPINE FINDINGS Segmentation: 5 lumbar type vertebrae. Alignment: Normal. Vertebrae: No acute fracture or focal pathologic process. Paraspinal and other soft tissues: Negative. Disc levels: Maintained. Other: Fluid density lesion of the kidneys likely represent simple renal cysts. Simple renal cysts, in the absence of clinically indicated signs/symptoms, require no independent follow-up. IMPRESSION: No acute displaced fracture or traumatic listhesis of the cervical, thoracic, lumbar spine. Electronically Signed   By: Morgane  Naveau M.D.   On: 08/12/2024 21:18   CT Thoracic Spine Wo Contrast Result Date: 08/12/2024 CLINICAL DATA:  Back trauma, no prior imaging (Age >= 16y); Neck trauma,  dangerous injury mechanism (Age 54-64y) EXAM: CT CERVICAL, THORACIC, AND LUMBAR SPINE WITHOUT CONTRAST TECHNIQUE: Multidetector CT imaging of the cervical, thoracic and lumbar spine was performed without intravenous contrast. Multiplanar CT image reconstructions were also generated. RADIATION DOSE REDUCTION: This exam was performed according to the departmental dose-optimization program which includes automated exposure control, adjustment of the mA and/or kV according to patient size and/or use of iterative reconstruction technique. COMPARISON:  None Available. FINDINGS: CT CERVICAL SPINE FINDINGS Alignment: Normal. Skull base and vertebrae: No acute fracture. No aggressive appearing focal osseous lesion or focal pathologic process. Soft tissues and spinal canal: No prevertebral fluid or swelling. No visible canal hematoma. Upper chest: Unremarkable. Other: None. CT THORACIC SPINE FINDINGS Alignment: Normal. Vertebrae: No acute fracture or focal pathologic process. Paraspinal and other soft tissues: Negative. Disc levels: Maintained. CT LUMBAR SPINE FINDINGS Segmentation: 5 lumbar type vertebrae. Alignment: Normal. Vertebrae: No acute fracture or focal pathologic process. Paraspinal and other soft tissues: Negative. Disc levels: Maintained. Other: Fluid density lesion of the kidneys likely represent simple renal cysts. Simple renal  cysts, in the absence of clinically indicated signs/symptoms, require no independent follow-up. IMPRESSION: No acute displaced fracture or traumatic listhesis of the cervical, thoracic, lumbar spine. Electronically Signed   By: Morgane  Naveau M.D.   On: 08/12/2024 21:18   CT Lumbar Spine Wo Contrast Result Date: 08/12/2024 CLINICAL DATA:  Back trauma, no prior imaging (Age >= 16y); Neck trauma, dangerous injury mechanism (Age 30-64y) EXAM: CT CERVICAL, THORACIC, AND LUMBAR SPINE WITHOUT CONTRAST TECHNIQUE: Multidetector CT imaging of the cervical, thoracic and lumbar spine was  performed without intravenous contrast. Multiplanar CT image reconstructions were also generated. RADIATION DOSE REDUCTION: This exam was performed according to the departmental dose-optimization program which includes automated exposure control, adjustment of the mA and/or kV according to patient size and/or use of iterative reconstruction technique. COMPARISON:  None Available. FINDINGS: CT CERVICAL SPINE FINDINGS Alignment: Normal. Skull base and vertebrae: No acute fracture. No aggressive appearing focal osseous lesion or focal pathologic process. Soft tissues and spinal canal: No prevertebral fluid or swelling. No visible canal hematoma. Upper chest: Unremarkable. Other: None. CT THORACIC SPINE FINDINGS Alignment: Normal. Vertebrae: No acute fracture or focal pathologic process. Paraspinal and other soft tissues: Negative. Disc levels: Maintained. CT LUMBAR SPINE FINDINGS Segmentation: 5 lumbar type vertebrae. Alignment: Normal. Vertebrae: No acute fracture or focal pathologic process. Paraspinal and other soft tissues: Negative. Disc levels: Maintained. Other: Fluid density lesion of the kidneys likely represent simple renal cysts. Simple renal cysts, in the absence of clinically indicated signs/symptoms, require no independent follow-up. IMPRESSION: No acute displaced fracture or traumatic listhesis of the cervical, thoracic, lumbar spine. Electronically Signed   By: Morgane  Naveau M.D.   On: 08/12/2024 21:18   CT Head Wo Contrast Result Date: 08/12/2024 CLINICAL DATA:  Head trauma, moderate-severe EXAM: CT HEAD WITHOUT CONTRAST TECHNIQUE: Contiguous axial images were obtained from the base of the skull through the vertex without intravenous contrast. RADIATION DOSE REDUCTION: This exam was performed according to the departmental dose-optimization program which includes automated exposure control, adjustment of the mA and/or kV according to patient size and/or use of iterative reconstruction technique.  COMPARISON:  MRI head 11/18/2023, CT head 11/17/2023 FINDINGS: Brain: No evidence of large-territorial acute infarction. No parenchymal hemorrhage. No mass lesion. No extra-axial collection. No mass effect or midline shift. No hydrocephalus. Basilar cisterns are patent. Vascular: No hyperdense vessel. Skull: No acute fracture or focal lesion. Sinuses/Orbits: Paranasal sinuses and mastoid air cells are clear. The orbits are unremarkable. Other: None. IMPRESSION: No acute intracranial abnormality. Electronically Signed   By: Morgane  Naveau M.D.   On: 08/12/2024 21:15     Procedures   Medications Ordered in the ED  ondansetron  (ZOFRAN -ODT) disintegrating tablet 4 mg (4 mg Oral Given 08/12/24 2028)  oxyCODONE -acetaminophen  (PERCOCET/ROXICET) 5-325 MG per tablet 1 tablet (1 tablet Oral Given 08/12/24 2028)    Clinical Course as of 08/12/24 2345  Sat Aug 12, 2024  2230 No significant traumatic findings.  Patient resting comfortably at this time.  Counseled her on symptomatic management of musculoskeletal strain sprain and concussion symptoms.  She will follow-up with PCP.  Return precautions were discussed in detail. [MP]    Clinical Course User Index [MP] Pamella Ozell LABOR, DO                                 Medical Decision Making 53 year old female with history as above presenting with a headache and back pain after falling backwards out  of a chair at work 1 week ago.  She did strike her head at that time.  No LOC.  Hematoma over the posterior occiput has decreased in size.  No evidence of head trauma on my exam.  She is diffusely tender throughout the cervical spine thoracic and lumbar spine.  Will obtain CT ridging of the head C-spine T-spine L-spine.  Will give a dose of Percocet here for significant pain continue to monitor  Amount and/or Complexity of Data Reviewed Radiology: ordered.  Risk Prescription drug management.        Final diagnoses:  Concussion without loss of  consciousness, initial encounter  Acute nonintractable headache, unspecified headache type  Acute back pain, unspecified back location, unspecified back pain laterality    ED Discharge Orders     None          Pamella Ozell LABOR, DO 08/12/24 2345

## 2024-08-12 NOTE — Discharge Instructions (Signed)
 You were seen in the Emergency Department for headaches and back pain after a fall a week ago This fall occurred while you were at work It is most likely you have symptoms of concussion You should follow-up with your primary care doctor to discuss concussion management You may have headaches and pains for the next few weeks Return to the emergency room for severe pain or any other concerns Continue taking Tylenol  as directed for aches and pains

## 2024-08-31 ENCOUNTER — Emergency Department (HOSPITAL_BASED_OUTPATIENT_CLINIC_OR_DEPARTMENT_OTHER)
Admission: EM | Admit: 2024-08-31 | Discharge: 2024-09-01 | Disposition: A | Discharge status: Home / Self Care | Attending: Emergency Medicine | Admitting: Emergency Medicine

## 2024-08-31 ENCOUNTER — Other Ambulatory Visit: Payer: Self-pay

## 2024-08-31 DIAGNOSIS — R519 Headache, unspecified: Secondary | ICD-10-CM | POA: Insufficient documentation

## 2024-08-31 DIAGNOSIS — M542 Cervicalgia: Secondary | ICD-10-CM | POA: Diagnosis present

## 2024-08-31 LAB — CBC
HCT: 40.1 % (ref 36.0–46.0)
Hemoglobin: 12.9 g/dL (ref 12.0–15.0)
MCH: 26.8 pg (ref 26.0–34.0)
MCHC: 32.2 g/dL (ref 30.0–36.0)
MCV: 83.4 fL (ref 80.0–100.0)
Platelets: 323 K/uL (ref 150–400)
RBC: 4.81 MIL/uL (ref 3.87–5.11)
RDW: 12.7 % (ref 11.5–15.5)
WBC: 6.8 K/uL (ref 4.0–10.5)
nRBC: 0 % (ref 0.0–0.2)

## 2024-08-31 LAB — LIPASE, BLOOD: Lipase: 34 U/L (ref 11–51)

## 2024-08-31 LAB — COMPREHENSIVE METABOLIC PANEL WITH GFR
ALT: 15 U/L (ref 0–44)
AST: 22 U/L (ref 15–41)
Albumin: 4.9 g/dL (ref 3.5–5.0)
Alkaline Phosphatase: 116 U/L (ref 38–126)
Anion gap: 14 (ref 5–15)
BUN: 13 mg/dL (ref 6–20)
CO2: 24 mmol/L (ref 22–32)
Calcium: 10.4 mg/dL — ABNORMAL HIGH (ref 8.9–10.3)
Chloride: 100 mmol/L (ref 98–111)
Creatinine, Ser: 0.75 mg/dL (ref 0.44–1.00)
GFR, Estimated: >60 mL/min (ref >60)
Glucose, Bld: 123 mg/dL — ABNORMAL HIGH (ref 70–99)
Potassium: 4.1 mmol/L (ref 3.5–5.1)
Sodium: 137 mmol/L (ref 135–145)
Total Bilirubin: 0.5 mg/dL (ref 0.0–1.2)
Total Protein: 8.4 g/dL — ABNORMAL HIGH (ref 6.5–8.1)

## 2024-08-31 NOTE — ED Triage Notes (Signed)
 Pt POV reporting persistent neck and back pain related to fall one month ago. Dx concussion. Multiple episodes of emesis today, unable to look at computer at work due to pain.

## 2024-09-01 ENCOUNTER — Emergency Department (HOSPITAL_BASED_OUTPATIENT_CLINIC_OR_DEPARTMENT_OTHER)

## 2024-09-01 DIAGNOSIS — M542 Cervicalgia: Secondary | ICD-10-CM | POA: Diagnosis not present

## 2024-09-01 MED ORDER — HYDROMORPHONE HCL 1 MG/ML IJ SOLN
1.0000 mg | Freq: Once | INTRAMUSCULAR | Status: AC
  Administered 2024-09-01: 1 mg via INTRAVENOUS
  Filled 2024-09-01: qty 1

## 2024-09-01 MED ORDER — DIPHENHYDRAMINE HCL 50 MG/ML IJ SOLN
50.0000 mg | Freq: Once | INTRAMUSCULAR | Status: AC
  Administered 2024-09-01: 50 mg via INTRAVENOUS
  Filled 2024-09-01: qty 1

## 2024-09-01 MED ORDER — OXYCODONE HCL 5 MG PO TABS: 5.0000 mg | ORAL_TABLET | Freq: Four times a day (QID) | ORAL | 0 refills | Status: DC | PRN

## 2024-09-01 MED ORDER — CYCLOBENZAPRINE HCL 10 MG PO TABS: 10.0000 mg | ORAL_TABLET | Freq: Two times a day (BID) | ORAL | 0 refills | Status: AC | PRN

## 2024-09-01 MED ORDER — IOHEXOL 350 MG/ML SOLN
75.0000 mL | Freq: Once | INTRAVENOUS | Status: AC | PRN
  Administered 2024-09-01: 75 mL via INTRAVENOUS

## 2024-09-01 MED ORDER — ONDANSETRON HCL 4 MG/2ML IJ SOLN
4.0000 mg | Freq: Once | INTRAMUSCULAR | Status: AC
  Administered 2024-09-01: 4 mg via INTRAVENOUS
  Filled 2024-09-01: qty 2

## 2024-09-01 MED ORDER — DIPHENHYDRAMINE HCL 25 MG PO CAPS: 50.0000 mg | ORAL_CAPSULE | Freq: Once | ORAL | Status: AC

## 2024-09-01 MED ORDER — METHYLPREDNISOLONE SODIUM SUCC 40 MG IJ SOLR
40.0000 mg | Freq: Once | INTRAMUSCULAR | Status: AC
  Administered 2024-09-01: 40 mg via INTRAVENOUS
  Filled 2024-09-01: qty 1

## 2024-09-01 NOTE — ED Notes (Signed)
 Patient transported to CT

## 2024-09-01 NOTE — ED Provider Notes (Signed)
 Emergency Department Provider Note  TRIAGE NOTE: Pt POV reporting persistent neck and back pain related to fall one month ago. Dx concussion. Multiple episodes of emesis today, unable to look at computer at work due to pain.   HISTORY  Chief Complaint Neck Pain and Back Pain   HPI Donna [name] is a 53 y.o. female with  severe headache and vomiting, following a fall approximately one month ago. The patient reports that during the fall, they struck the back of their head on a dolly, resulting in a concussion diagnosed at that time. Since the incident, the patient has experienced persistent pain described as a vice grip sensation at the back of the head, with pain radiating down into the back. The patient took a week off work due to the intensity of the pain, which is exacerbated by sitting at a computer screen. Today, the pain has been particularly excruciating, leading to four episodes of vomiting, which the patient attributes to the severity of the pain. The patient has been self-medicating with Tylenol  and using heat for relief. There is no reported diarrhea, constipation, or fever. The patient has not followed up with a primary care physician since the initial ER visit and does not have a primary care provider. History was obtained from the patient.  PMH Past Medical History:  Diagnosis Date   DVT (deep venous thrombosis) (HCC)    right arm   Endometriosis    Lupus (systemic lupus erythematosus) (HCC) 2007   Migraine    Seizures (HCC)    last sz 06/2013, no meds    Home Medications Prior to Admission medications   Medication Sig Start Date End Date Taking? Authorizing Provider  cyclobenzaprine  (FLEXERIL ) 10 MG tablet Take 1 tablet (10 mg total) by mouth 2 (two) times daily as needed for muscle spasms. 09/01/24  Yes Michaelann Gunnoe, Selinda, MD  oxyCODONE  (ROXICODONE ) 5 MG immediate release tablet Take 1 tablet (5 mg total) by mouth every 6 (six) hours as needed for breakthrough pain.  09/01/24  Yes Severn Goddard, Selinda, MD  acetaminophen  (TYLENOL ) 500 MG tablet Take 500 mg by mouth every 6 (six) hours as needed for moderate pain.    [provider]  ASHWAGANDHA PO Take 1 tablet by mouth daily.    [provider]  cholecalciferol  (VITAMIN D ) 1000 units tablet Take 1,000 Units by mouth daily.    [provider]  Dulaglutide  (TRULICITY ) 0.75 MG/0.5ML SOPN Inject 0.75 mg into the skin once a week. Patient not taking: Reported on 11/18/2023 01/18/23   Newlin, Enobong, MD  EPINEPHrine  (EPIPEN  2-PAK) 0.3 mg/0.3 mL IJ SOAJ injection Inject 0.3 mLs (0.3 mg total) into the muscle once as needed (for severe allergic reaction). 06/28/20   Molpus, John, MD  ondansetron  (ZOFRAN -ODT) 4 MG disintegrating tablet Take 1 tablet (4 mg total) by mouth every 8 (eight) hours as needed for nausea or vomiting. 11/22/23   Briana Elgin LABOR, MD  pantoprazole  (PROTONIX ) 40 MG tablet Take 1 tablet (40 mg total) by mouth daily. 11/21/23 12/21/23  Briana Elgin LABOR, MD  vitamin B-12 (CYANOCOBALAMIN ) 1000 MCG tablet Take 1,000 mcg by mouth daily.    [provider]  dicyclomine  (BENTYL ) 20 MG tablet Take 1 tablet (20 mg total) by mouth 2 (two) times daily as needed for spasms. 02/20/19 06/28/20  Fawze, Mina A, PA-C  ipratropium-albuterol  (DUONEB) 0.5-2.5 (3) MG/3ML SOLN Take 3 mLs by nebulization every 4 (four) hours as needed. Patient not taking: Reported on 09/22/2018 03/11/18 06/07/19  Sheikh, Omair Latif, DO  omeprazole  (PRILOSEC) 20 MG capsule Take 1 capsule (20 mg total) by mouth 2 (two) times daily before a meal. 06/07/19 06/28/20  Pollina, Lonni PARAS, MD    Social History Social History   Tobacco Use   Smoking status: Never   Smokeless tobacco: Never  Vaping Use   Vaping status: Never Used  Substance Use Topics   Alcohol use: No   Drug use: No    Review of Systems: Documented in HPI ____________________________________________  PHYSICAL EXAM: VITAL SIGNS: Triage: Blood  pressure 100/69, pulse 62, temperature 97.8 F (36.6 C), resp. rate 16, height 5' (1.524 m), weight 64 kg, last menstrual period 06/14/2003, SpO2 99%.  Vitals:   09/01/24 0400 09/01/24 0520 09/01/24 0545 09/01/24 0546  BP: 127/76 127/74 (!) 145/75   Pulse: 77 75 97   Resp:  17 18   Temp:    98.6 F (37 C)  TempSrc:    Oral  SpO2: 95% 95% 96%   Weight:      Height:        Physical Exam Vitals and nursing note reviewed.  Constitutional:      Appearance: She is well-developed.  HENT:     Head: Normocephalic and atraumatic.  Neck:     Comments: Ttp bilateral paracervical muscles L>R Cardiovascular:     Rate and Rhythm: Normal rate and regular rhythm.  Pulmonary:     Effort: No respiratory distress.     Breath sounds: No stridor.  Abdominal:     General: There is no distension.  Musculoskeletal:     Cervical back: Normal range of motion.  Neurological:     Mental Status: She is alert.     Comments: No altered mental status, able to give full seemingly accurate history.  Face is symmetric, EOM's intact, pupils equal and reactive, vision intact, tongue and uvula midline without deviation. Upper and Lower extremity motor 5/5, intact pain perception in distal extremities, 2+ reflexes in biceps, patella and achilles tendons. Able to perform finger to nose normal with both hands. Walks without assistance or evident ataxia.        ____________________________________________   LABS (all labs ordered are listed, but only abnormal results are displayed)  Labs Reviewed  COMPREHENSIVE METABOLIC PANEL WITH GFR - Abnormal; Notable for the following components:      Result Value   Glucose, Bld 123 (*)    Calcium 10.4 (*)    Total Protein 8.4 (*)    All other components within normal limits  LIPASE, BLOOD  CBC   ____________________________________________  EKG   EKG Interpretation Date/Time:    Ventricular Rate:    PR Interval:    QRS Duration:    QT Interval:    QTC  Calculation:   R Axis:      Text Interpretation:          ____________________________________________  RADIOLOGY  CT Angio Neck W and/or Wo Contrast Result Date: 09/01/2024 CLINICAL DATA:  53 year old female status post trauma last month. Persistent neck and back pain. Vomiting. EXAM: CT ANGIOGRAPHY NECK TECHNIQUE: Multidetector CT imaging of the neck was performed using the standard protocol during bolus administration of intravenous contrast. Multiplanar CT image reconstructions and MIPs were obtained to evaluate the vascular anatomy. Carotid stenosis measurements (when applicable) are obtained utilizing NASCET criteria, using the distal internal carotid diameter as the denominator. RADIATION DOSE REDUCTION: This exam was performed according to the departmental dose-optimization program which includes automated exposure control, adjustment of  the mA and/or kV according to patient size and/or use of iterative reconstruction technique. CONTRAST:  75mL OMNIPAQUE  IOHEXOL  350 MG/ML SOLN COMPARISON:  CT head, cervical and thoracic spine 08/12/2024. FINDINGS: CTA NECK Skeleton: Bone mineralization is within normal limits. Visualized skull base is intact. No atlanto-occipital dissociation. C1 and C2 appear intact and aligned. Mild for age cervical spine degeneration. No acute osseous abnormality identified. Upper chest: Negative; minor dependent atelectasis. Other neck: Nonvascular neck soft tissue spaces are within normal limits; subcentimeter thyroid nodules (no follow-up imaging recommended). Grossly negative visible brain parenchyma. Aortic arch: Normal 3 vessel arch, no significant arch atherosclerosis. Right carotid system: Patent with no atherosclerosis or stenosis. Mild ICA tortuosity. Left carotid system: Patent with no atherosclerosis or stenosis. Vertebral arteries: Proximal subclavian arteries and vertebral artery origins are normal. Codominant vertebral arteries appear patent and normal to the  skull base. LIMITED CTA HEAD Posterior circulation: Codominant distal vertebral arteries, vertebrobasilar junction, visible basilar artery, bilateral PICA, AICA origins appear normal. Anterior circulation: Both ICA siphons appear patent and normal. Carotid termini, MCA and ACA origins are partially visible and appear patent. Anatomic variants: None. Review of the MIP images confirms the above findings IMPRESSION: Normal CTA Neck. Electronically Signed   By: VEAR Hurst M.D.   On: 09/01/2024 04:34   ____________________________________________  PROCEDURES  Procedure(s) performed:   Procedures ____________________________________________  INITIAL IMPRESSION / ASSESSMENT AND PLAN   Patient presents with severe headache and vomiting a month after suffering a concussion from a fall.  Initial DDx:       Differential diagnosis includes but is not limited to post-concussion syndrome, cervical strain, cervical artery dissection, and tension headache.  ED Course   ED Course: The patient presented to the ED with persistent head and neck pain following a fall approximately one month ago, during which they sustained a concussion. The patient reports worsening pain described as a vice grip sensation at the back of the head, radiating down the neck, and associated with vomiting four times today. The patient has been using Tylenol  and heat for pain management. A CT scan of head wo contrast was previously performed during the initial visit and negative for fracture or bleed. Given the bilateral neck pain, I did CTA the neck for potential injuries, although this was deemed unlikely. Medication was provided for pain relief in the ED.  Differential Diagnosis: Differential diagnosis includes but is not limited to post-concussion syndrome, cervical strain, vertebral artery dissection, and tension headache.  Diagnostics Review  Historians other than the patient: The patient's daughter was present and provided  additional context regarding the patient's symptoms and history.  Management  Medications: Administered medication for pain relief in the ED.  Re-Evaluations/Course of Care:CTA negative. Likely post concussive vs more likely MSK. Neurology follow up vs PCP follow up for further management.  Clinical Impression & Plan  Primary Diagnosis: Post-concussion syndrome  Secondary Diagnoses: Cervical strain      Images ordered viewed and obtained by myself. Agree with Radiology interpretation. Details in ED course.  Labs ordered reviewed by myself as detailed in ED course.  Consultations obtained/considered detailed in ED course.    FINAL IMPRESSION Final diagnoses:  Neck pain  Nonintractable headache, unspecified chronicity pattern, unspecified headache type     Disposition A medical screening exam was performed and I feel the patient has had an appropriate workup for their chief complaint at this time and likelihood of emergent condition existing is low. They have been counseled on decision, DISCHARGE, follow up  and which symptoms necessitate immediate return to the emergency department. They or their family verbally stated understanding and agreement with plan and discharged in stable condition.   ____________________________________________   NEW OUTPATIENT MEDICATIONS STARTED DURING THIS VISIT:  Discharge Medication List as of 09/01/2024  5:39 AM     START taking these medications   Details  cyclobenzaprine  (FLEXERIL ) 10 MG tablet Take 1 tablet (10 mg total) by mouth 2 (two) times daily as needed for muscle spasms., Starting Fri 09/01/2024, Normal    oxyCODONE  (ROXICODONE ) 5 MG immediate release tablet Take 1 tablet (5 mg total) by mouth every 6 (six) hours as needed for breakthrough pain., Starting Fri 09/01/2024, Normal        Note:  This note was prepared with assistance of Dragon voice recognition software. Occasional wrong-word or sound-a-like substitutions may have  occurred due to the inherent limitations of voice recognition software.    Marce Charlesworth, Selinda, MD 09/01/24 0600

## 2024-09-01 NOTE — ED Notes (Signed)
 Spoke with CT that Benadryl  was given.

## 2024-09-01 NOTE — ED Notes (Signed)
 Back from CT

## 2024-11-23 ENCOUNTER — Emergency Department (HOSPITAL_BASED_OUTPATIENT_CLINIC_OR_DEPARTMENT_OTHER)

## 2024-11-23 ENCOUNTER — Emergency Department (HOSPITAL_BASED_OUTPATIENT_CLINIC_OR_DEPARTMENT_OTHER)
Admission: EM | Admit: 2024-11-23 | Discharge: 2024-11-23 | Disposition: A | Discharge status: Home / Self Care | Attending: Emergency Medicine | Admitting: Emergency Medicine

## 2024-11-23 ENCOUNTER — Other Ambulatory Visit: Payer: Self-pay

## 2024-11-23 DIAGNOSIS — R519 Headache, unspecified: Secondary | ICD-10-CM | POA: Diagnosis not present

## 2024-11-23 DIAGNOSIS — R6884 Jaw pain: Secondary | ICD-10-CM | POA: Diagnosis present

## 2024-11-23 LAB — BASIC METABOLIC PANEL WITH GFR
Anion gap: 10 (ref 5–15)
BUN: 13 mg/dL (ref 6–20)
CO2: 28 mmol/L (ref 22–32)
Calcium: 10.4 mg/dL — ABNORMAL HIGH (ref 8.9–10.3)
Chloride: 101 mmol/L (ref 98–111)
Creatinine, Ser: 0.69 mg/dL (ref 0.44–1.00)
GFR, Estimated: >60 mL/min (ref >60)
Glucose, Bld: 180 mg/dL — ABNORMAL HIGH (ref 70–99)
Potassium: 3.8 mmol/L (ref 3.5–5.1)
Sodium: 139 mmol/L (ref 135–145)

## 2024-11-23 LAB — CBC WITH DIFFERENTIAL/PLATELET
Abs Immature Granulocytes: 0.01 K/uL (ref 0.00–0.07)
Basophils Absolute: 0.1 K/uL (ref 0.0–0.1)
Basophils Relative: 1 %
Eosinophils Absolute: 0.1 K/uL (ref 0.0–0.5)
Eosinophils Relative: 2 %
HCT: 38.6 % (ref 36.0–46.0)
Hemoglobin: 12.3 g/dL (ref 12.0–15.0)
Immature Granulocytes: 0 %
Lymphocytes Relative: 47 %
Lymphs Abs: 3.2 K/uL (ref 0.7–4.0)
MCH: 26.7 pg (ref 26.0–34.0)
MCHC: 31.9 g/dL (ref 30.0–36.0)
MCV: 83.7 fL (ref 80.0–100.0)
Monocytes Absolute: 0.4 K/uL (ref 0.1–1.0)
Monocytes Relative: 7 %
Neutro Abs: 2.8 K/uL (ref 1.7–7.7)
Neutrophils Relative %: 43 %
Platelets: 289 K/uL (ref 150–400)
RBC: 4.61 MIL/uL (ref 3.87–5.11)
RDW: 12.7 % (ref 11.5–15.5)
WBC: 6.6 K/uL (ref 4.0–10.5)
nRBC: 0 % (ref 0.0–0.2)

## 2024-11-23 MED ORDER — OXYCODONE HCL 5 MG PO TABS: 5.0000 mg | ORAL_TABLET | Freq: Four times a day (QID) | ORAL | 0 refills | Status: AC | PRN

## 2024-11-23 MED ORDER — DEXAMETHASONE SOD PHOSPHATE PF 10 MG/ML IJ SOLN
10.0000 mg | Freq: Once | INTRAMUSCULAR | Status: AC
  Administered 2024-11-23: 10 mg via INTRAVENOUS

## 2024-11-23 MED ORDER — METHYLPREDNISOLONE 4 MG PO TBPK: ORAL_TABLET | ORAL | 0 refills | Status: AC

## 2024-11-23 MED ORDER — FENTANYL CITRATE (PF) 50 MCG/ML IJ SOSY
50.0000 ug | PREFILLED_SYRINGE | Freq: Once | INTRAMUSCULAR | Status: AC
  Administered 2024-11-23: 50 ug via INTRAVENOUS
  Filled 2024-11-23: qty 1

## 2024-11-23 NOTE — ED Provider Notes (Signed)
 [place] EMERGENCY DEPARTMENT AT [facility] Provider Note   CSN: [id number] Arrival date & time: 11/23/24  2023     Patient presents with: Facial Pain   Donna [name] is a 53 y.o. female.   Patient here with pain over her right jaw worse with palpation chewing and opening her mouth and movement.  Been ongoing for the last couple days.  She feels like she might of been grinding her teeth at night.  She denies any fever or chills.  She denies any major swelling.  No issues like this in the past.  She does have lupus history seizure history migraine history.  Denies any ear pain.  Does feel like it is coming from her teeth as well.  Denies any difficulty eating or drinking otherwise.  No pain with swallowing.  The history is provided by the patient.       Prior to Admission medications  Medication Sig Start Date End Date Taking? Authorizing Provider  methylPREDNISolone  (MEDROL  DOSEPAK) 4 MG TBPK tablet Follow package insert 11/23/24  Yes Avinash Maltos, DO  oxyCODONE  (ROXICODONE ) 5 MG immediate release tablet Take 1 tablet (5 mg total) by mouth every 6 (six) hours as needed for up to 10 doses. 11/23/24  Yes Anesa Fronek, DO  acetaminophen  (TYLENOL ) 500 MG tablet Take 500 mg by mouth every 6 (six) hours as needed for moderate pain.    [provider]  ASHWAGANDHA PO Take 1 tablet by mouth daily.    [provider]  cholecalciferol  (VITAMIN D ) 1000 units tablet Take 1,000 Units by mouth daily.    [provider]  cyclobenzaprine  (FLEXERIL ) 10 MG tablet Take 1 tablet (10 mg total) by mouth 2 (two) times daily as needed for muscle spasms. 09/01/24   Mesner, Selinda, MD  Dulaglutide  (TRULICITY ) 0.75 MG/0.5ML SOPN Inject 0.75 mg into the skin once a week. Patient not taking: Reported on 11/18/2023 01/18/23   Newlin, Enobong, MD  EPINEPHrine  (EPIPEN  2-PAK) 0.3 mg/0.3 mL IJ SOAJ injection Inject 0.3 mLs (0.3 mg total) into the muscle once as needed (for  severe allergic reaction). 06/28/20   Molpus, John, MD  ondansetron  (ZOFRAN -ODT) 4 MG disintegrating tablet Take 1 tablet (4 mg total) by mouth every 8 (eight) hours as needed for nausea or vomiting. 11/22/23   Briana Elgin LABOR, MD  pantoprazole  (PROTONIX ) 40 MG tablet Take 1 tablet (40 mg total) by mouth daily. 11/21/23 12/21/23  Briana Elgin LABOR, MD  vitamin B-12 (CYANOCOBALAMIN ) 1000 MCG tablet Take 1,000 mcg by mouth daily.    [provider]  dicyclomine  (BENTYL ) 20 MG tablet Take 1 tablet (20 mg total) by mouth 2 (two) times daily as needed for spasms. 02/20/19 06/28/20  Fawze, Mina A, PA-C  ipratropium-albuterol  (DUONEB) 0.5-2.5 (3) MG/3ML SOLN Take 3 mLs by nebulization every 4 (four) hours as needed. Patient not taking: Reported on 09/22/2018 03/11/18 06/07/19  Sherrill Cable Latif, DO  omeprazole  (PRILOSEC) 20 MG capsule Take 1 capsule (20 mg total) by mouth 2 (two) times daily before a meal. 06/07/19 06/28/20  Pollina, Lonni PARAS, MD    Allergies: Bee venom, Diamox [acetazolamide], Nsaids, Omnipaque  [iohexol ], Tolmetin, Compazine [prochlorperazine], Iopamidol , Lactose intolerance (gi), and Reglan [metoclopramide]    Review of Systems  Updated Vital Signs BP (!) 157/83   Pulse 71   Temp 98 F (36.7 C) (Temporal)   Resp 19   Ht 5' (1.524 m)   Wt 66.2 kg   LMP 06/14/2003   SpO2 100%  BMI 28.51 kg/m   Physical Exam Vitals and nursing note reviewed.  Constitutional:      General: She is not in acute distress.    Appearance: She is well-developed. She is not ill-appearing.  HENT:     Head: Normocephalic and atraumatic.     Comments: Tenderness over the right TMJ area there is no obvious trismus drooling submandibular swelling, no facial swelling that is obvious, no cellulitic changes less obvious, no obvious dental caries on exam    Nose: Nose normal.     Mouth/Throat:     Mouth: Mucous membranes are moist.  Eyes:     Extraocular Movements: Extraocular movements intact.      Conjunctiva/sclera: Conjunctivae normal.     Pupils: Pupils are equal, round, and reactive to light.  Cardiovascular:     Rate and Rhythm: Normal rate and regular rhythm.     Pulses: Normal pulses.     Heart sounds: Normal heart sounds. No murmur heard. Pulmonary:     Effort: Pulmonary effort is normal. No respiratory distress.     Breath sounds: Normal breath sounds.  Abdominal:     Palpations: Abdomen is soft.     Tenderness: There is no abdominal tenderness.  Musculoskeletal:        General: No swelling or tenderness.     Cervical back: Normal range of motion and neck supple.  Skin:    General: Skin is warm and dry.     Capillary Refill: Capillary refill takes less than 2 seconds.  Neurological:     General: No focal deficit present.     Mental Status: She is alert and oriented to person, place, and time.     Cranial Nerves: No cranial nerve deficit.     Sensory: No sensory deficit.     Motor: No weakness.     Coordination: Coordination normal.  Psychiatric:        Mood and Affect: Mood normal.     (all labs ordered are listed, but only abnormal results are displayed) Labs Reviewed  BASIC METABOLIC PANEL WITH GFR - Abnormal; Notable for the following components:      Result Value   Glucose, Bld 180 (*)    Calcium 10.4 (*)    All other components within normal limits  CBC WITH DIFFERENTIAL/PLATELET    EKG: None  Radiology: CT Soft Tissue Neck Wo Contrast Result Date: 11/23/2024 EXAM: CT NECK WITHOUT CONTRAST 11/23/2024 10:11:57 PM TECHNIQUE: CT of the neck was performed without the administration of intravenous contrast. Multiplanar reformatted images are provided for review. Automated exposure control, iterative reconstruction, and/or weight based adjustment of the mA/kV was utilized to reduce the radiation dose to as low as reasonably achievable. COMPARISON: None available. CLINICAL HISTORY: Right TMJ pain FINDINGS: AERODIGESTIVE TRACT: No discrete mass. No edema.  SALIVARY GLANDS: The parotid and submandibular glands are unremarkable. THYROID: Unremarkable. LYMPH NODES: No suspicious cervical lymphadenopathy. SOFT TISSUES: No mass or fluid collection. BRAIN, ORBITS, SINUSES AND MASTOIDS: No acute abnormality. LUNGS AND MEDIASTINUM: No acute abnormality. BONES: No focal bone abnormality. IMPRESSION: 1. Unremarkable CT neck. Electronically signed by: Gilmore Molt 11/23/2024 10:35 PM EST RP Workstation: [id number]     Procedures   Medications Ordered in the ED  dexamethasone  (DECADRON ) injection 10 mg (has no administration in time range)  fentaNYL  (SUBLIMAZE ) injection 50 mcg (50 mcg Intravenous Given 11/23/24 2155)  Medical Decision Making Amount and/or Complexity of Data Reviewed Labs: ordered. Radiology: ordered.  Risk Prescription drug management.   Donna [name] is here with right sided jaw pain.  She is very tender over the right TMJ area.  Differential diagnosis I do think that this is likely some sort of TMJ process or inflammatory process here.  I do not really appreciate any obvious dental infection or any submandibular swelling.  She has no trismus no drooling.  This seems less likely to be any infectious process.  She denies any trauma.  She might be grinding her teeth at night.  Ultimately she has got unremarkable vitals.  No fever.  History of lupus.  Will get a CT of the lower jaw to take evaluation for any kind of fracture or malalignment.  I do not really have any concern for infectious process.  Will get basic labs and give a dose of IV fentanyl  and reevaluate.  Lab work unremarkable per my review and interpretation.  No acute findings on CT scan per radiology report.  She is feeling much better.  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  She was told to return if symptoms worsen.  Discharge.  This chart was  dictated using voice recognition software.  Despite best efforts to proofread,  errors can occur which can change the documentation meaning.      Final diagnoses:  Jaw pain    ED Discharge Orders          Ordered    methylPREDNISolone  (MEDROL  DOSEPAK) 4 MG TBPK tablet        11/23/24 2226    oxyCODONE  (ROXICODONE ) 5 MG immediate release tablet  Every 6 hours PRN        11/23/24 2226               Ruthe Cornet, DO 11/23/24 2242

## 2024-11-23 NOTE — ED Triage Notes (Signed)
 Pt POV reporting severe facial pain from R ear down to to R jaw since Sunday.

## 2024-11-23 NOTE — ED Notes (Signed)
 Reviewed AVS/discharge instructions with patient. Time allotted for and all questions answered. Patient is agreeable for d/c and escorted to ED exit by staff.

## 2024-11-23 NOTE — Discharge Instructions (Signed)
 Take Medrol  Dosepak as prescribed.  Recommend 1000 mg of Tylenol  every 6 hours as needed for pain.  I prescribed you a narcotic pain medicine called Roxicodone  for breakthrough pain.  This medication is sedating so please be careful with its use.  Do not mix alcohol drugs or dangerous activities including driving.  Follow-up with your nose and throat doctor.  Follow-up with your primary care doctor.  Return if symptoms worsen.

## 2024-12-22 ENCOUNTER — Emergency Department (HOSPITAL_BASED_OUTPATIENT_CLINIC_OR_DEPARTMENT_OTHER)
Admission: EM | Admit: 2024-12-22 | Discharge: 2024-12-22 | Disposition: A | Discharge status: Home / Self Care | Attending: Emergency Medicine | Admitting: Emergency Medicine

## 2024-12-22 ENCOUNTER — Emergency Department (HOSPITAL_BASED_OUTPATIENT_CLINIC_OR_DEPARTMENT_OTHER)

## 2024-12-22 ENCOUNTER — Other Ambulatory Visit: Payer: Self-pay

## 2024-12-22 DIAGNOSIS — J40 Bronchitis, not specified as acute or chronic: Secondary | ICD-10-CM | POA: Insufficient documentation

## 2024-12-22 DIAGNOSIS — R059 Cough, unspecified: Secondary | ICD-10-CM | POA: Diagnosis present

## 2024-12-22 LAB — RESP PANEL BY RT-PCR (RSV, FLU A&B, COVID)  RVPGX2
Influenza A by PCR: NEGATIVE
Influenza B by PCR: NEGATIVE
Resp Syncytial Virus by PCR: NEGATIVE
SARS Coronavirus 2 by RT PCR: NEGATIVE

## 2024-12-22 MED ORDER — IPRATROPIUM BROMIDE 0.02 % IN SOLN
0.5000 mg | Freq: Once | RESPIRATORY_TRACT | Status: AC
  Administered 2024-12-22: 0.5 mg via RESPIRATORY_TRACT
  Filled 2024-12-22: qty 2.5

## 2024-12-22 MED ORDER — PREDNISONE 50 MG PO TABS: ORAL_TABLET | ORAL | 0 refills | Status: DC

## 2024-12-22 MED ORDER — ALBUTEROL SULFATE HFA 108 (90 BASE) MCG/ACT IN AERS
2.0000 | INHALATION_SPRAY | RESPIRATORY_TRACT | Status: DC
  Administered 2024-12-22: 2 via RESPIRATORY_TRACT
  Filled 2024-12-22: qty 6.7

## 2024-12-22 MED ORDER — ALBUTEROL SULFATE (2.5 MG/3ML) 0.083% IN NEBU
5.0000 mg | INHALATION_SOLUTION | Freq: Once | RESPIRATORY_TRACT | Status: AC
  Administered 2024-12-22: 5 mg via RESPIRATORY_TRACT
  Filled 2024-12-22: qty 6

## 2024-12-22 MED ORDER — PREDNISONE 50 MG PO TABS
60.0000 mg | ORAL_TABLET | Freq: Once | ORAL | Status: AC
  Administered 2024-12-22: 60 mg via ORAL
  Filled 2024-12-22: qty 1

## 2024-12-22 NOTE — ED Provider Notes (Signed)
 " [place] EMERGENCY DEPARTMENT AT [facility] Provider Note   CSN: [id number] Arrival date & time: 12/22/24  2107     Patient presents with: Cough   Donna [name] is a 54 y.o. female.   54 year old female presents with 2-week history of nonproductive cough with shortness of breath.  Had positive sick exposures and thinks that she the flu.  States that she has not been febrile for several days.  Notes that her cough is worse at night.  Is nonproductive.  No associated anginal or CHF type symptoms.  No headache or photophobia.  Has noted some laryngitis and pain in her ribs when she coughs.  Has been using over-the-counter medications without relief      Prior to Admission medications  Medication Sig Start Date End Date Taking? Authorizing Provider  acetaminophen  (TYLENOL ) 500 MG tablet Take 500 mg by mouth every 6 (six) hours as needed for moderate pain.    [provider]  ASHWAGANDHA PO Take 1 tablet by mouth daily.    [provider]  cholecalciferol  (VITAMIN D ) 1000 units tablet Take 1,000 Units by mouth daily.    [provider]  cyclobenzaprine  (FLEXERIL ) 10 MG tablet Take 1 tablet (10 mg total) by mouth 2 (two) times daily as needed for muscle spasms. 09/01/24   Mesner, Selinda, MD  Dulaglutide  (TRULICITY ) 0.75 MG/0.5ML SOPN Inject 0.75 mg into the skin once a week. Patient not taking: Reported on 11/18/2023 01/18/23   Newlin, Enobong, MD  EPINEPHrine  (EPIPEN  2-PAK) 0.3 mg/0.3 mL IJ SOAJ injection Inject 0.3 mLs (0.3 mg total) into the muscle once as needed (for severe allergic reaction). 06/28/20   Molpus, John, MD  methylPREDNISolone  (MEDROL  DOSEPAK) 4 MG TBPK tablet Follow package insert 11/23/24   Curatolo, Adam, DO  ondansetron  (ZOFRAN -ODT) 4 MG disintegrating tablet Take 1 tablet (4 mg total) by mouth every 8 (eight) hours as needed for nausea or vomiting. 11/22/23   Briana Elgin LABOR, MD  oxyCODONE  (ROXICODONE ) 5 MG immediate release tablet  Take 1 tablet (5 mg total) by mouth every 6 (six) hours as needed for up to 10 doses. 11/23/24   Curatolo, Adam, DO  pantoprazole  (PROTONIX ) 40 MG tablet Take 1 tablet (40 mg total) by mouth daily. 11/21/23 12/21/23  Briana Elgin LABOR, MD  vitamin B-12 (CYANOCOBALAMIN ) 1000 MCG tablet Take 1,000 mcg by mouth daily.    [provider]  dicyclomine  (BENTYL ) 20 MG tablet Take 1 tablet (20 mg total) by mouth 2 (two) times daily as needed for spasms. 02/20/19 06/28/20  Fawze, Mina A, PA-C  ipratropium-albuterol  (DUONEB) 0.5-2.5 (3) MG/3ML SOLN Take 3 mLs by nebulization every 4 (four) hours as needed. Patient not taking: Reported on 09/22/2018 03/11/18 06/07/19  Sherrill Cable Latif, DO  omeprazole  (PRILOSEC) 20 MG capsule Take 1 capsule (20 mg total) by mouth 2 (two) times daily before a meal. 06/07/19 06/28/20  Pollina, Lonni PARAS, MD    Allergies: Bee venom, Diamox [acetazolamide], Nsaids, Omnipaque  [iohexol ], Tolmetin, Compazine [prochlorperazine], Iopamidol , Lactose intolerance (gi), and Reglan [metoclopramide]    Review of Systems  All other systems reviewed and are negative.   Updated Vital Signs BP (!) 149/103   Pulse 96   Temp 98.1 F (36.7 C)   Resp 20   Ht 1.524 m (5')   Wt 66.2 kg   LMP 06/14/2003   SpO2 100%   BMI 28.51 kg/m   Physical Exam Vitals and nursing note reviewed.  Constitutional:      General:  She is not in acute distress.    Appearance: Normal appearance. She is well-developed. She is not toxic-appearing.  HENT:     Head: Normocephalic and atraumatic.  Eyes:     General: Lids are normal.     Conjunctiva/sclera: Conjunctivae normal.     Pupils: Pupils are equal, round, and reactive to light.  Neck:     Thyroid: No thyroid mass.     Trachea: No tracheal deviation.  Cardiovascular:     Rate and Rhythm: Normal rate and regular rhythm.     Heart sounds: Normal heart sounds. No murmur heard.    No gallop.  Pulmonary:     Effort: Pulmonary effort is normal.  No respiratory distress.     Breath sounds: No stridor. Decreased breath sounds present. No wheezing, rhonchi or rales.  Abdominal:     General: There is no distension.     Palpations: Abdomen is soft.     Tenderness: There is no abdominal tenderness. There is no rebound.  Musculoskeletal:        General: No tenderness. Normal range of motion.     Cervical back: Normal range of motion and neck supple.  Skin:    General: Skin is warm and dry.     Findings: No abrasion or rash.  Neurological:     Mental Status: She is alert and oriented to person, place, and time. Mental status is at baseline.     GCS: GCS eye subscore is 4. GCS verbal subscore is 5. GCS motor subscore is 6.     Cranial Nerves: No cranial nerve deficit.     Sensory: No sensory deficit.     Motor: Motor function is intact.  Psychiatric:        Attention and Perception: Attention normal.        Speech: Speech normal.        Behavior: Behavior normal.     (all labs ordered are listed, but only abnormal results are displayed) Labs Reviewed  RESP PANEL BY RT-PCR (RSV, FLU A&B, COVID)  [id number]    EKG: None  Radiology: No results found.   Procedures   Medications Ordered in the ED  albuterol  (PROVENTIL ) (2.5 MG/3ML) 0.083% nebulizer solution 5 mg (has no administration in time range)  ipratropium (ATROVENT ) nebulizer solution 0.5 mg (has no administration in time range)  predniSONE  (DELTASONE ) tablet 60 mg (has no administration in time range)                                    Medical Decision Making Amount and/or Complexity of Data Reviewed Radiology: ordered.  Risk Prescription drug management.   Chest x-ray shows no acute findings.  COVID and flu test negative here.  Albuterol  with Atrovent  with prednisone  and feels better.  Suspect bronchitis.  Will place patient on prednisone , give a butyryl inhaler as well as give antitussives.     Final diagnoses:  None    ED Discharge Orders     None           Dasie Faden, MD 12/22/24 2250  "

## 2024-12-22 NOTE — ED Triage Notes (Signed)
 Flu symptoms since Christmas-feeling better the ~28th. 30th symptoms came back. Cough-mostly dry, hoarse voice, rib pains. Tessalon , mucinex , robitussin, theraflu.

## 2024-12-24 ENCOUNTER — Telehealth (HOSPITAL_BASED_OUTPATIENT_CLINIC_OR_DEPARTMENT_OTHER): Payer: Self-pay | Admitting: Emergency Medicine

## 2024-12-24 MED ORDER — PREDNISONE 50 MG PO TABS: ORAL_TABLET | ORAL | 0 refills | Status: AC

## 2024-12-27 ENCOUNTER — Emergency Department (HOSPITAL_BASED_OUTPATIENT_CLINIC_OR_DEPARTMENT_OTHER)
Admission: EM | Admit: 2024-12-27 | Discharge: 2024-12-27 | Disposition: A | Discharge status: Home / Self Care | Attending: Emergency Medicine | Admitting: Emergency Medicine

## 2024-12-27 ENCOUNTER — Encounter (HOSPITAL_BASED_OUTPATIENT_CLINIC_OR_DEPARTMENT_OTHER): Payer: Self-pay

## 2024-12-27 ENCOUNTER — Emergency Department (HOSPITAL_BASED_OUTPATIENT_CLINIC_OR_DEPARTMENT_OTHER): Admitting: Radiology

## 2024-12-27 DIAGNOSIS — R0602 Shortness of breath: Secondary | ICD-10-CM | POA: Diagnosis present

## 2024-12-27 DIAGNOSIS — J40 Bronchitis, not specified as acute or chronic: Secondary | ICD-10-CM | POA: Insufficient documentation

## 2024-12-27 DIAGNOSIS — I509 Heart failure, unspecified: Secondary | ICD-10-CM | POA: Diagnosis not present

## 2024-12-27 LAB — BASIC METABOLIC PANEL WITH GFR
Anion gap: 14 (ref 5–15)
BUN: 13 mg/dL (ref 6–20)
CO2: 26 mmol/L (ref 22–32)
Calcium: 10.6 mg/dL — ABNORMAL HIGH (ref 8.9–10.3)
Chloride: 98 mmol/L (ref 98–111)
Creatinine, Ser: 0.76 mg/dL (ref 0.44–1.00)
GFR, Estimated: >60 mL/min
Glucose, Bld: 374 mg/dL — ABNORMAL HIGH (ref 70–99)
Potassium: 4 mmol/L (ref 3.5–5.1)
Sodium: 137 mmol/L (ref 135–145)

## 2024-12-27 LAB — CBC
HCT: 42.4 % (ref 36.0–46.0)
Hemoglobin: 13.8 g/dL (ref 12.0–15.0)
MCH: 27.1 pg (ref 26.0–34.0)
MCHC: 32.5 g/dL (ref 30.0–36.0)
MCV: 83.1 fL (ref 80.0–100.0)
Platelets: 327 K/uL (ref 150–400)
RBC: 5.1 MIL/uL (ref 3.87–5.11)
RDW: 12.2 % (ref 11.5–15.5)
WBC: 6 K/uL (ref 4.0–10.5)
nRBC: 0 % (ref 0.0–0.2)

## 2024-12-27 LAB — TROPONIN T, HIGH SENSITIVITY
Troponin T High Sensitivity: <15 ng/L (ref 0–19)
Troponin T High Sensitivity: <15 ng/L (ref 0–19)

## 2024-12-27 LAB — D-DIMER, QUANTITATIVE: D-Dimer, Quant: 0.29 ug{FEU}/mL (ref 0.00–0.50)

## 2024-12-27 MED ORDER — ALBUTEROL SULFATE (2.5 MG/3ML) 0.083% IN NEBU
INHALATION_SOLUTION | RESPIRATORY_TRACT | Status: AC
  Administered 2024-12-27: 5 mg via RESPIRATORY_TRACT
  Filled 2024-12-27: qty 6

## 2024-12-27 MED ORDER — ACETAMINOPHEN 500 MG PO TABS
1000.0000 mg | ORAL_TABLET | Freq: Once | ORAL | Status: AC
  Administered 2024-12-27: 1000 mg via ORAL
  Filled 2024-12-27: qty 2

## 2024-12-27 MED ORDER — IPRATROPIUM-ALBUTEROL 0.5-2.5 (3) MG/3ML IN SOLN
3.0000 mL | Freq: Once | RESPIRATORY_TRACT | Status: AC
  Administered 2024-12-27: 3 mL via RESPIRATORY_TRACT
  Filled 2024-12-27: qty 3

## 2024-12-27 MED ORDER — ALBUTEROL SULFATE (2.5 MG/3ML) 0.083% IN NEBU: 5.0000 mg | INHALATION_SOLUTION | Freq: Once | RESPIRATORY_TRACT | Status: AC

## 2024-12-27 MED ORDER — ONDANSETRON HCL 4 MG/2ML IJ SOLN
4.0000 mg | Freq: Once | INTRAMUSCULAR | Status: AC
  Administered 2024-12-27: 4 mg via INTRAVENOUS
  Filled 2024-12-27: qty 2

## 2024-12-27 MED ORDER — BENZONATATE 100 MG PO CAPS
100.0000 mg | ORAL_CAPSULE | Freq: Once | ORAL | Status: AC
  Administered 2024-12-27: 100 mg via ORAL
  Filled 2024-12-27: qty 1

## 2024-12-27 MED ORDER — METHYLPREDNISOLONE SODIUM SUCC 125 MG IJ SOLR
125.0000 mg | Freq: Once | INTRAMUSCULAR | Status: AC
  Administered 2024-12-27: 125 mg via INTRAVENOUS
  Filled 2024-12-27: qty 2

## 2024-12-27 MED ORDER — LIDOCAINE VISCOUS HCL 2 % MT SOLN
15.0000 mL | Freq: Once | OROMUCOSAL | Status: AC
  Administered 2024-12-27: 15 mL via OROMUCOSAL
  Filled 2024-12-27: qty 15

## 2024-12-27 NOTE — ED Provider Notes (Signed)
 " [place] EMERGENCY DEPARTMENT AT [facility] Provider Note   CSN: [id number] Arrival date & time: 12/27/24  1613     Patient presents with: Shortness of Breath and Chest Pain   Donna [name] is a 54 y.o. female.  With a history of lupus, bronchitis, heart failure, TIA and DVT who presents to the ED for shortness of breath and chest pain.  Patient has experienced ongoing cough congestion shortness of breath and chest pain since late December.  She was seen here 5 days ago and was temporarily feeling better after albuterol  steroids for suspected bronchitis but cough chest pain wheezing have gotten worse.  No fevers chills.  Cough is mostly dry.  No GI symptoms.  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     Shortness of Breath Associated symptoms: chest pain   Chest Pain Associated symptoms: shortness of breath        Prior to Admission medications  Medication Sig Start Date End Date Taking? Authorizing Provider  acetaminophen  (TYLENOL ) 500 MG tablet Take 500 mg by mouth every 6 (six) hours as needed for moderate pain.    [provider]  ASHWAGANDHA PO Take 1 tablet by mouth daily.    [provider]  cholecalciferol  (VITAMIN D ) 1000 units tablet Take 1,000 Units by mouth daily.    [provider]  cyclobenzaprine  (FLEXERIL ) 10 MG tablet Take 1 tablet (10 mg total) by mouth 2 (two) times daily as needed for muscle spasms. 09/01/24   Mesner, Selinda, MD  Dulaglutide  (TRULICITY ) 0.75 MG/0.5ML SOPN Inject 0.75 mg into the skin once a week. Patient not taking: Reported on 11/18/2023 01/18/23   Newlin, Enobong, MD  EPINEPHrine  (EPIPEN  2-PAK) 0.3 mg/0.3 mL IJ SOAJ injection Inject 0.3 mLs (0.3 mg total) into the muscle once as needed (for severe allergic reaction). 06/28/20   Molpus, John, MD  methylPREDNISolone  (MEDROL  DOSEPAK) 4 MG TBPK tablet Follow package insert 11/23/24   Curatolo, Adam, DO  ondansetron  (ZOFRAN -ODT) 4 MG disintegrating  tablet Take 1 tablet (4 mg total) by mouth every 8 (eight) hours as needed for nausea or vomiting. 11/22/23   Briana Elgin LABOR, MD  oxyCODONE  (ROXICODONE ) 5 MG immediate release tablet Take 1 tablet (5 mg total) by mouth every 6 (six) hours as needed for up to 10 doses. 11/23/24   Curatolo, Adam, DO  pantoprazole  (PROTONIX ) 40 MG tablet Take 1 tablet (40 mg total) by mouth daily. 11/21/23 12/21/23  Briana Elgin LABOR, MD  predniSONE  (DELTASONE ) 50 MG tablet 1 p.o. daily x 5 12/24/24   Dasie Faden, MD  predniSONE  (DELTASONE ) 50 MG tablet 1 p.o. daily x 5 12/24/24   Dasie Faden, MD  vitamin B-12 (CYANOCOBALAMIN ) 1000 MCG tablet Take 1,000 mcg by mouth daily.    [provider]  dicyclomine  (BENTYL ) 20 MG tablet Take 1 tablet (20 mg total) by mouth 2 (two) times daily as needed for spasms. 02/20/19 06/28/20  Fawze, Mina A, PA-C  ipratropium-albuterol  (DUONEB) 0.5-2.5 (3) MG/3ML SOLN Take 3 mLs by nebulization every 4 (four) hours as needed. Patient not taking: Reported on 09/22/2018 03/11/18 06/07/19  Sherrill Cable Latif, DO  omeprazole  (PRILOSEC) 20 MG capsule Take 1 capsule (20 mg total) by mouth 2 (two) times daily before a meal. 06/07/19 06/28/20  Pollina, Lonni PARAS, MD    Allergies: Bee venom, Diamox [acetazolamide], Nsaids, Omnipaque  [iohexol ], Tolmetin, Compazine [prochlorperazine], Iopamidol , Lactose intolerance (gi), and Reglan [metoclopramide]    Review of Systems  Respiratory:  Positive for shortness  of breath.   Cardiovascular:  Positive for chest pain.    Updated Vital Signs BP 119/64   Pulse 85   Temp 98.5 F (36.9 C) (Oral)   Resp 18   Ht 5' (1.524 m)   Wt 66.2 kg   LMP 06/14/2003   SpO2 100%   BMI 28.51 kg/m   Physical Exam Vitals and nursing note reviewed.  HENT:     Head: Normocephalic and atraumatic.  Eyes:     Pupils: Pupils are equal, round, and reactive to light.  Cardiovascular:     Rate and Rhythm: Normal rate and regular rhythm.  Pulmonary:     Effort:  Pulmonary effort is normal.     Breath sounds: Examination of the right-lower field reveals decreased breath sounds. Examination of the left-lower field reveals decreased breath sounds. Decreased breath sounds present.  Abdominal:     Palpations: Abdomen is soft.     Tenderness: There is no abdominal tenderness.  Skin:    General: Skin is warm and dry.  Neurological:     Mental Status: She is alert.  Psychiatric:        Mood and Affect: Mood normal.     (all labs ordered are listed, but only abnormal results are displayed) Labs Reviewed  BASIC METABOLIC PANEL WITH GFR - Abnormal; Notable for the following components:      Result Value   Glucose, Bld 374 (*)    Calcium 10.6 (*)    All other components within normal limits  CBC  D-DIMER, QUANTITATIVE  TROPONIN T, HIGH SENSITIVITY  TROPONIN T, HIGH SENSITIVITY    EKG: EKG Interpretation Date/Time:  Wednesday December 27 2024 16:22:07 EST Ventricular Rate:  97 PR Interval:  133 QRS Duration:  83 QT Interval:  341 QTC Calculation: 434 R Axis:   40  Text Interpretation: Sinus rhythm Borderline T abnormalities, anterior leads Confirmed by Pamella Sharper [phone]) on 12/27/2024 4:39:55 PM  Radiology: [facility] Chest 2 View Result Date: 12/27/2024 CLINICAL DATA:  Shortness of breath. EXAM: CHEST - 2 VIEW COMPARISON:  Chest radiograph dated 12/22/2024. FINDINGS: The heart size and mediastinal contours are within normal limits. Both lungs are clear. The visualized skeletal structures are unremarkable. IMPRESSION: No active cardiopulmonary disease. Electronically Signed   By: Vanetta Chou M.D.   On: 12/27/2024 16:45     Procedures   Medications Ordered in the ED  albuterol  (PROVENTIL ) (2.5 MG/3ML) 0.083% nebulizer solution 5 mg (5 mg Nebulization Given 12/27/24 1621)  ipratropium-albuterol  (DUONEB) 0.5-2.5 (3) MG/3ML nebulizer solution 3 mL (3 mLs Nebulization Given 12/27/24 1708)  methylPREDNISolone  sodium succinate  (SOLU-MEDROL ) 125  mg/2 mL injection 125 mg (125 mg Intravenous Given 12/27/24 1727)  benzonatate  (TESSALON ) capsule 100 mg (100 mg Oral Given 12/27/24 1727)  acetaminophen  (TYLENOL ) tablet 1,000 mg (1,000 mg Oral Given 12/27/24 1727)  ondansetron  (ZOFRAN ) injection 4 mg (4 mg Intravenous Given 12/27/24 1853)  lidocaine  (XYLOCAINE ) 2 % viscous mouth solution 15 mL (15 mLs Mouth/Throat Given 12/27/24 1951)    Clinical Course as of 12/27/24 1959  Wed Dec 27, 2024  1958 D-dimer negative.  High sensitive troponin negative.  Low suspicion for PE and ACS.  Hyperglycemia likely in the setting of recent steroid use.  Chest x-ray looks clear again today.  EKG without evidence of dysrhythmia.  Patient still having a dry cough but feels a little bit better after breathing treatment Solu-Medrol  here.  She has an MDI Tessalon  Perles and steroids at home.  She wants to go home  at this time.  She understands return precautions worrisome for respiratory distress to come back for. [MP]    Clinical Course User Index [MP] Pamella Ozell LABOR, DO                                 Medical Decision Making 54 year old female with history as above returns for shortness of breath coughing wheezing chest pain.  Seen here last Friday and discharged with prednisone  albuterol  MDI for suspected of bronchitis.  Is constantly coughing on my exam but 100% on room air not requiring supplemental oxygen.  Decreased breath sounds in the lower lung fields not much wheezing.  Will evaluate for viral URI, pneumonia, bronchitis, ACS and PE.  If dimer is elevated we will plan on CTA chest to evaluate for PE.  Will provide DuoNeb treatment and IV Solu-Medrol  and reassess  Amount and/or Complexity of Data Reviewed Labs: ordered. Radiology: ordered.  Risk OTC drugs. Prescription drug management.        Final diagnoses:  Bronchitis    ED Discharge Orders     None          Pamella Ozell LABOR, DO 12/27/24 1959  "

## 2024-12-27 NOTE — ED Triage Notes (Signed)
 Present with short of breath and coughing.  Some labored breathing.  Exp.  Wheezing.  States here last Friday.  On prednisone  and inhaler Chest pain with coughing.

## 2024-12-27 NOTE — Discharge Instructions (Signed)
 You were seen in the emergency department for coughing and shortness of breath There was no evidence of heart attack or blood clot in your lungs Your chest x-ray was clear He felt a little bit better after breathing treatment and more steroids Continue to use your albuterol  inhaler as directed Complete the course of steroids Take Tessalon  Perles or over the over-the-counter cough suppressants if you find relief from these Return for chest pain or trouble breathing
